# Patient Record
Sex: Male | Born: 1985 | Race: Black or African American | Hispanic: No | Marital: Single | Smoking: Former smoker
Health system: Southern US, Community
[De-identification: ages and names within clinical notes are randomized; demographics above are authoritative.]

## PROBLEM LIST (undated history)

## (undated) ENCOUNTER — Emergency Department (HOSPITAL_COMMUNITY): Admission: EM | Payer: Self-pay | Source: Home / Self Care

## (undated) DIAGNOSIS — F4310 Post-traumatic stress disorder, unspecified: Secondary | ICD-10-CM

## (undated) DIAGNOSIS — Z21 Asymptomatic human immunodeficiency virus [HIV] infection status: Secondary | ICD-10-CM

## (undated) DIAGNOSIS — B1920 Unspecified viral hepatitis C without hepatic coma: Secondary | ICD-10-CM

## (undated) DIAGNOSIS — F319 Bipolar disorder, unspecified: Secondary | ICD-10-CM

## (undated) DIAGNOSIS — I10 Essential (primary) hypertension: Secondary | ICD-10-CM

## (undated) HISTORY — PX: NO PAST SURGERIES: SHX2092

---

## 2014-05-01 ENCOUNTER — Emergency Department (HOSPITAL_COMMUNITY): Payer: Self-pay

## 2014-05-01 ENCOUNTER — Encounter (HOSPITAL_COMMUNITY): Payer: Self-pay | Admitting: Emergency Medicine

## 2014-05-01 ENCOUNTER — Other Ambulatory Visit: Payer: Self-pay

## 2014-05-01 ENCOUNTER — Emergency Department (HOSPITAL_COMMUNITY)
Admission: EM | Admit: 2014-05-01 | Discharge: 2014-05-02 | Disposition: A | Discharge status: Home / Self Care | Payer: Self-pay | Attending: Emergency Medicine | Admitting: Emergency Medicine

## 2014-05-01 DIAGNOSIS — I10 Essential (primary) hypertension: Secondary | ICD-10-CM | POA: Insufficient documentation

## 2014-05-01 DIAGNOSIS — F411 Generalized anxiety disorder: Secondary | ICD-10-CM | POA: Insufficient documentation

## 2014-05-01 DIAGNOSIS — IMO0002 Reserved for concepts with insufficient information to code with codable children: Secondary | ICD-10-CM | POA: Insufficient documentation

## 2014-05-01 DIAGNOSIS — Z008 Encounter for other general examination: Secondary | ICD-10-CM | POA: Insufficient documentation

## 2014-05-01 DIAGNOSIS — G479 Sleep disorder, unspecified: Secondary | ICD-10-CM | POA: Insufficient documentation

## 2014-05-01 DIAGNOSIS — F1210 Cannabis abuse, uncomplicated: Secondary | ICD-10-CM | POA: Insufficient documentation

## 2014-05-01 DIAGNOSIS — F4310 Post-traumatic stress disorder, unspecified: Secondary | ICD-10-CM | POA: Insufficient documentation

## 2014-05-01 DIAGNOSIS — F319 Bipolar disorder, unspecified: Secondary | ICD-10-CM | POA: Insufficient documentation

## 2014-05-01 DIAGNOSIS — R51 Headache: Secondary | ICD-10-CM | POA: Insufficient documentation

## 2014-05-01 HISTORY — DX: Essential (primary) hypertension: I10

## 2014-05-01 HISTORY — DX: Bipolar disorder, unspecified: F31.9

## 2014-05-01 HISTORY — DX: Post-traumatic stress disorder, unspecified: F43.10

## 2014-05-01 LAB — CBC
HCT: 40.1 % (ref 39.0–52.0)
Hemoglobin: 14.2 g/dL (ref 13.0–17.0)
MCH: 28 pg (ref 26.0–34.0)
MCHC: 35.4 g/dL (ref 30.0–36.0)
MCV: 78.9 fL (ref 78.0–100.0)
Platelets: 316 10*3/uL (ref 150–400)
RBC: 5.08 MIL/uL (ref 4.22–5.81)
RDW: 13.4 % (ref 11.5–15.5)
WBC: 12 10*3/uL — ABNORMAL HIGH (ref 4.0–10.5)

## 2014-05-01 LAB — COMPREHENSIVE METABOLIC PANEL
ALT: 12 U/L (ref 0–53)
AST: 19 U/L (ref 0–37)
Albumin: 4.3 g/dL (ref 3.5–5.2)
Alkaline Phosphatase: 55 U/L (ref 39–117)
Anion gap: 15 (ref 5–15)
BUN: 12 mg/dL (ref 6–23)
CO2: 22 mEq/L (ref 19–32)
Calcium: 9.5 mg/dL (ref 8.4–10.5)
Chloride: 103 mEq/L (ref 96–112)
Creatinine, Ser: 1.08 mg/dL (ref 0.50–1.35)
GFR calc Af Amer: >90 mL/min (ref >90)
GFR calc non Af Amer: >90 mL/min (ref >90)
Glucose, Bld: 85 mg/dL (ref 70–99)
Potassium: 4.1 mEq/L (ref 3.7–5.3)
Sodium: 140 mEq/L (ref 137–147)
Total Bilirubin: 0.5 mg/dL (ref 0.3–1.2)
Total Protein: 7.2 g/dL (ref 6.0–8.3)

## 2014-05-01 LAB — I-STAT TROPONIN, ED: Troponin i, poc: 0 ng/mL (ref 0.00–0.08)

## 2014-05-01 LAB — ETHANOL: Alcohol, Ethyl (B): <11 mg/dL (ref 0–11)

## 2014-05-01 LAB — SALICYLATE LEVEL: Salicylate Lvl: <2 mg/dL — ABNORMAL LOW (ref 2.8–20.0)

## 2014-05-01 LAB — ACETAMINOPHEN LEVEL: Acetaminophen (Tylenol), Serum: <15 ug/mL (ref 10–30)

## 2014-05-01 MED ORDER — ALUM & MAG HYDROXIDE-SIMETH 200-200-20 MG/5ML PO SUSP: 30.0000 mL | ORAL | Status: DC | PRN

## 2014-05-01 MED ORDER — LORAZEPAM 1 MG PO TABS
1.0000 mg | ORAL_TABLET | Freq: Three times a day (TID) | ORAL | Status: DC | PRN
  Administered 2014-05-02: 1 mg via ORAL
  Filled 2014-05-01: qty 1

## 2014-05-01 MED ORDER — IBUPROFEN 400 MG PO TABS: 600.0000 mg | ORAL_TABLET | Freq: Three times a day (TID) | ORAL | Status: DC | PRN

## 2014-05-01 MED ORDER — ZOLPIDEM TARTRATE 5 MG PO TABS: 5.0000 mg | ORAL_TABLET | Freq: Every evening | ORAL | Status: DC | PRN

## 2014-05-01 MED ORDER — ONDANSETRON HCL 4 MG PO TABS: 4.0000 mg | ORAL_TABLET | Freq: Three times a day (TID) | ORAL | Status: DC | PRN

## 2014-05-01 MED ORDER — NICOTINE 21 MG/24HR TD PT24
21.0000 mg | MEDICATED_PATCH | Freq: Every day | TRANSDERMAL | Status: DC
Start: 2014-05-02 — End: 2014-05-02

## 2014-05-01 NOTE — ED Notes (Signed)
This RN walked by pt and found him talking to himself, he then began [facility] stating "why the f**k you over here, black man sitting down, I'm just trying to get my medicine." This RN stopped tand talked with pt, I informed pt we have [employer] 24/7 and they are not here for him. Pt then stated "I just want my medicine, I have been off my bipolar medications since October."

## 2014-05-01 NOTE — ED Notes (Signed)
Patient refusing blood work.

## 2014-05-01 NOTE — ED Notes (Signed)
RN could not complete chest pain/cardiac assessment because patient refused and became verbally aggressive. Patient refusing EKG. MD notified.

## 2014-05-01 NOTE — ED Notes (Signed)
Patient aware a urine sample is needed. 

## 2014-05-01 NOTE — ED Notes (Signed)
Per [facility] pt was walking [place] and [place] [address], her reported to EMS he had walking all day. Pt reported he is from [place]. EMS reports pt pointed to his mid-chest when asked what was bothering him today. Pt then refused to talk to the EMS any more. VSS

## 2014-05-01 NOTE — ED Provider Notes (Signed)
CSN: [id number]     Arrival date & time 05/01/14  2050 History   First MD Initiated Contact with Patient 05/01/14 2132     Chief Complaint  Patient presents with  . Chest Pain  . Medical Clearance     (Consider location/radiation/quality/duration/timing/severity/associated sxs/prior Treatment) Patient is a 28 y.o. male presenting with chest pain. The history is provided by the patient and medical records. No language interpreter was used.  Chest Pain Associated symptoms: headache (resolved)   Associated symptoms: no abdominal pain, no back pain, no cough, no diaphoresis, no fatigue, no fever, no nausea, no shortness of breath and not vomiting     Tristan [name] is a 28 y.o. male  with a hx of HTN, PTSD, Bipolar 1 presents to the Emergency Department complaining of gradual, persistent, progressively worsening feelings of being unsafe and unable to control his emotions with an unknown onset.  Patient arrives by EMS after being picked up while [place] [address].  Patient reports he is from [place] to refuse to give EMS any further information and upon arriving here in the emergency room began screaming at Tristan [name].  Patient refused assessment by RN as well as blood work.  On my assessment patient tearful, stopping in the room stating that he feels unsafe and that he is unable to control his emotions. Patient reports he is not taking any medications for his bipolar disorder and states he has not taken them in many years.  He wants to know if this is safe place.    He reports chest pain persistent for approximately one hour earlier today but denies associated symptoms with it.  He also reports that he had a headache at some point which is also resolved.  He denies personal or family cardiac history. He denies smoking, alcohol use or drug abuse.   Past Medical History  Diagnosis Date  . Hypertension   . PTSD (post-traumatic stress disorder)   . Bipolar 1 disorder    History reviewed. No  pertinent past surgical history. No family history on file. History  Substance Use Topics  . Smoking status: Never Smoker   . Smokeless tobacco: Not on file  . Alcohol Use: No    Review of Systems  Constitutional: Negative for fever, diaphoresis, appetite change, fatigue and unexpected weight change.  HENT: Negative for mouth sores.   Eyes: Negative for visual disturbance.  Respiratory: Negative for cough, chest tightness, shortness of breath and wheezing.   Cardiovascular: Positive for chest pain ( resolved).  Gastrointestinal: Negative for nausea, vomiting, abdominal pain, diarrhea and constipation.  Endocrine: Negative for polydipsia, polyphagia and polyuria.  Genitourinary: Negative for dysuria, urgency, frequency and hematuria.  Musculoskeletal: Negative for back pain and neck stiffness.  Skin: Negative for rash.  Allergic/Immunologic: Negative for immunocompromised state.  Neurological: Positive for headaches (resolved). Negative for syncope and light-headedness.  Hematological: Does not bruise/bleed easily.  Psychiatric/Behavioral: Positive for sleep disturbance. Negative for suicidal ideas. The patient is nervous/anxious.       Allergies  Review of patient's allergies indicates no known allergies.  Home Medications   Prior to Admission medications   Not on File   BP 107/71  Pulse 93  Temp(Src) 98.4 F (36.9 C) (Oral)  Resp 16  Ht 5\' 10"  (1.778 m)  Wt 185 lb (83.915 kg)  BMI 26.54 kg/m2  SpO2 99% Physical Exam  Nursing note and vitals reviewed. Constitutional: He is oriented to person, place, and time. He appears well-developed and well-nourished. No distress.  Awake, alert, nontoxic appearance  HENT:  Head: Normocephalic and atraumatic.  Mouth/Throat: Oropharynx is clear and moist. No oropharyngeal exudate.  Eyes: Conjunctivae are normal. No scleral icterus.  Neck: Normal range of motion. Neck supple.  Cardiovascular: Normal rate, regular rhythm, normal  heart sounds and intact distal pulses.   No murmur heard. Pulmonary/Chest: Effort normal and breath sounds normal. No respiratory distress. He has no wheezes.  Equal chest expansion  Abdominal: Soft. Bowel sounds are normal. He exhibits no mass. There is no tenderness. There is no rebound and no guarding.  Musculoskeletal: Normal range of motion. He exhibits no edema.  Neurological: He is alert and oriented to person, place, and time. He exhibits normal muscle tone. Coordination normal.  Speech is clear and goal oriented Moves extremities without ataxia  Skin: Skin is warm and dry. He is not diaphoretic.  Psychiatric: His mood appears anxious. His affect is labile. His speech is rapid and/or pressured. He is agitated. He is not actively hallucinating. He expresses no homicidal and no suicidal ideation. He expresses no suicidal plans and no homicidal plans.    ED Course  Procedures (including critical care time) Labs Review Labs Reviewed  CBC - Abnormal; Notable for the following:    WBC 12.0 (*)    All other components within normal limits  SALICYLATE LEVEL - Abnormal; Notable for the following:    Salicylate Lvl <2.0 (*)    All other components within normal limits  ACETAMINOPHEN LEVEL  ETHANOL  COMPREHENSIVE METABOLIC PANEL  URINE RAPID DRUG SCREEN (HOSP PERFORMED)  URINALYSIS, ROUTINE W REFLEX MICROSCOPIC  I-STAT TROPOININ, ED    Imaging Review Dg Chest 2 View  05/01/2014   CLINICAL DATA:  Chest pain under the left breast.  EXAM: CHEST  2 VIEW  COMPARISON:  None.  FINDINGS: The lungs are well-aerated and clear. There is no evidence of focal opacification, pleural effusion or pneumothorax.  The heart is normal in size; the mediastinal contour is within normal limits. No acute osseous abnormalities are seen.  IMPRESSION: No acute cardiopulmonary process seen.   Electronically Signed   By: Roanna Raider M.D.   On: 05/01/2014 22:45     EKG Interpretation None      MDM    Final diagnoses:  PTSD (post-traumatic stress disorder)  Bipolar 1 disorder   Tristan [name] presents with labile emotions, resolved HA and CP and no cardiac Hx.  Pt reports no meds for Bipolar disorder, but feels unsafe.  He denies SI/HI and cannot tell me why he feels unsafe.  Pt denies auditory and visual hallucinations, but endorses sleep walking.    11:56 PM Labs reassuring and pt is medically cleared for [facility] assessment.    BP 107/71  Pulse 93  Temp(Src) 98.4 F (36.9 C) (Oral)  Resp 16  Ht 5\' 10"  (1.778 m)  Wt 185 lb (83.915 kg)  BMI 26.54 kg/m2  SpO2 99%   Dierdre Forth, PA-C 05/02/14 1610

## 2014-05-01 NOTE — ED Notes (Signed)
Hannah PA-C at bedside.  

## 2014-05-01 NOTE — ED Notes (Signed)
RN had therapeutic discussion with patient and explained treatment plan and explained to patient that this was a safe environment. Patient expressed concern that people are judging him and he is not a real problem and he is just taking up space. RN reassured patient that all of his medical care is confidential and we are here to treat him and advocate for him, not judge him. Patient is now more cooperative, but tearful and states he needs help. RN reassured patient he is in the right place for help.

## 2014-05-02 ENCOUNTER — Encounter (HOSPITAL_COMMUNITY): Payer: Self-pay | Admitting: General Practice

## 2014-05-02 ENCOUNTER — Inpatient Hospital Stay (HOSPITAL_COMMUNITY)
Admission: AD | Admit: 2014-05-02 | Discharge: 2014-05-06 | DRG: 885 | Disposition: A | Discharge status: Home / Self Care | Payer: Federal, State, Local not specified - Other | Source: Intra-hospital | Attending: Psychiatry | Admitting: Psychiatry

## 2014-05-02 ENCOUNTER — Encounter (HOSPITAL_COMMUNITY): Payer: Self-pay | Admitting: Emergency Medicine

## 2014-05-02 DIAGNOSIS — F411 Generalized anxiety disorder: Secondary | ICD-10-CM | POA: Diagnosis present

## 2014-05-02 DIAGNOSIS — G4700 Insomnia, unspecified: Secondary | ICD-10-CM | POA: Diagnosis present

## 2014-05-02 DIAGNOSIS — F4310 Post-traumatic stress disorder, unspecified: Secondary | ICD-10-CM | POA: Diagnosis present

## 2014-05-02 DIAGNOSIS — F3160 Bipolar disorder, current episode mixed, unspecified: Principal | ICD-10-CM

## 2014-05-02 DIAGNOSIS — F1210 Cannabis abuse, uncomplicated: Secondary | ICD-10-CM | POA: Diagnosis present

## 2014-05-02 DIAGNOSIS — I10 Essential (primary) hypertension: Secondary | ICD-10-CM | POA: Diagnosis present

## 2014-05-02 DIAGNOSIS — F602 Antisocial personality disorder: Secondary | ICD-10-CM | POA: Diagnosis present

## 2014-05-02 DIAGNOSIS — F410 Panic disorder [episodic paroxysmal anxiety] without agoraphobia: Secondary | ICD-10-CM | POA: Diagnosis present

## 2014-05-02 LAB — URINALYSIS, ROUTINE W REFLEX MICROSCOPIC
Bilirubin Urine: NEGATIVE
Glucose, UA: NEGATIVE mg/dL
Hgb urine dipstick: NEGATIVE
Ketones, ur: NEGATIVE mg/dL
Leukocytes, UA: NEGATIVE
Nitrite: NEGATIVE
Protein, ur: NEGATIVE mg/dL
Specific Gravity, Urine: 1.023 (ref 1.005–1.030)
Urobilinogen, UA: 1 mg/dL (ref 0.0–1.0)
pH: 6 (ref 5.0–8.0)

## 2014-05-02 LAB — RAPID URINE DRUG SCREEN, HOSP PERFORMED
Amphetamines: NOT DETECTED
Barbiturates: NOT DETECTED
Benzodiazepines: NOT DETECTED
Cocaine: NOT DETECTED
Opiates: NOT DETECTED
Tetrahydrocannabinol: POSITIVE — AB

## 2014-05-02 MED ORDER — OLANZAPINE 10 MG PO TBDP
5.0000 mg | ORAL_TABLET | Freq: Three times a day (TID) | ORAL | Status: DC | PRN
  Administered 2014-05-02: 5 mg via ORAL
  Filled 2014-05-02: qty 1

## 2014-05-02 MED ORDER — SERTRALINE HCL 50 MG PO TABS: 50.0000 mg | ORAL_TABLET | Freq: Every day | ORAL | Status: DC

## 2014-05-02 MED ORDER — LORAZEPAM 2 MG/ML IJ SOLN
INTRAMUSCULAR | Status: AC
  Filled 2014-05-02: qty 1

## 2014-05-02 MED ORDER — TRAZODONE HCL 50 MG PO TABS: 50.0000 mg | ORAL_TABLET | Freq: Every evening | ORAL | Status: DC | PRN

## 2014-05-02 MED ORDER — OLANZAPINE 10 MG IM SOLR: 5.0000 mg | Freq: Three times a day (TID) | INTRAMUSCULAR | Status: DC | PRN

## 2014-05-02 MED ORDER — LORAZEPAM 2 MG/ML IJ SOLN: 2.0000 mg | Freq: Once | INTRAMUSCULAR | Status: DC

## 2014-05-02 MED ORDER — ACETAMINOPHEN 325 MG PO TABS
650.0000 mg | ORAL_TABLET | Freq: Four times a day (QID) | ORAL | Status: DC | PRN
  Administered 2014-05-02: 325 mg via ORAL
  Filled 2014-05-02: qty 2

## 2014-05-02 MED ORDER — MAGNESIUM HYDROXIDE 400 MG/5ML PO SUSP
30.0000 mL | Freq: Every day | ORAL | Status: DC | PRN
  Administered 2014-05-03: 30 mL via ORAL

## 2014-05-02 MED ORDER — HYDROXYZINE HCL 25 MG PO TABS: 25.0000 mg | ORAL_TABLET | Freq: Four times a day (QID) | ORAL | Status: DC | PRN

## 2014-05-02 MED ORDER — ALUM & MAG HYDROXIDE-SIMETH 200-200-20 MG/5ML PO SUSP: 30.0000 mL | ORAL | Status: DC | PRN

## 2014-05-02 MED ORDER — QUETIAPINE FUMARATE ER 50 MG PO TB24
150.0000 mg | ORAL_TABLET | Freq: Every day | ORAL | Status: DC
  Filled 2014-05-02: qty 3

## 2014-05-02 NOTE — ED Notes (Signed)
Pt. Denies needs at this time.

## 2014-05-02 NOTE — ED Notes (Addendum)
TTS completed at this time, pt expressing concerns regarding whether or not he can have access to a phone. Informed pt that [facility] has set times during the day in which patients can use phone. Pt states he needs to let family know where he is. Nurse states he is more than welcome to make those phone calls in the morning. Pt escalated at this time, states "he needs to talk to someone or it's going to get serious." Pt pacing in room at this time. Charge nurse and [employer] notified.

## 2014-05-02 NOTE — Tx Team (Signed)
Initial Interdisciplinary Treatment Plan   PATIENT STRESSORS: Medication change or noncompliance   PROBLEM LIST: Problem List/Patient Goals Date to be addressed Date deferred Reason deferred Estimated date of resolution  Psychosis 05/02/2014                                                      DISCHARGE CRITERIA:  Improved stabilization in mood, thinking, and/or behavior Verbal commitment to aftercare and medication compliance  PRELIMINARY DISCHARGE PLAN: Attend PHP/IOP Outpatient therapy  PATIENT/FAMIILY INVOLVEMENT: This treatment plan has been presented to and reviewed with the patient, Tristan [name].  The patient and family have been given the opportunity to ask questions and make suggestions.  Ned ClinesDopson, Zarion Oliff E 05/02/2014, 3:02 PM

## 2014-05-02 NOTE — ED Notes (Signed)
Yelverton and charge nurse at bedside to de-escalate pt, pt states he is willing to take PO ativan, refusing IM ativan at this time.

## 2014-05-02 NOTE — ED Notes (Addendum)
Pt continuing to make threats, EDPs notified, see new orders. Pt refusing to communicate with [employer] and security. States he feels unsafe in hospital, states he feels [facility], "He's been watching me since I got here."

## 2014-05-02 NOTE — ED Provider Notes (Signed)
Patient is becoming increasingly agitated. Personally examined patient. He is paranoid. Nursing staff states he's uttering threats to them. Admitted to behavioral health worker that he was afraid he was going to "go off on someone". Recommended inpatient treatment. Given concern for patient elopement, poor insight, incapacity to make medical decisions regarding his care, and threat to others, IVC paperwork was completed. Patient been given sedative and awaits inpatient psychiatric bed.  Loren Raceravid Challis Crill, MD 05/02/14 [phone]

## 2014-05-02 NOTE — BH Assessment (Signed)
TTS did not receive call but noticed TTS consult request was ordered. Spoke to Earley FavorGail Schulz, NP who said to proceed with tele-assessment.  Harlin RainFord Ellis Ria CommentWarrick Jr, LPC, [facility] Triage Specialist [phone]

## 2014-05-02 NOTE — ED Notes (Signed)
[employer] HAS ARRIVED TO TRANSPORT PT. BELONGINGS AND VALUABLES SENT WITH PATIENT TO BH
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
## 2014-05-02 NOTE — ED Notes (Signed)
Spoke with pt's father at pt's request, updated him regarding pt condition at this time. Pt's father left phone number and requests that he be updated regarding where pt is placed. Gretel AcrePastor Pedro [phone]

## 2014-05-02 NOTE — ED Notes (Signed)
Spoke with pt's mother Ander SladeJoy in front of pt regarding pt's safety and plans for continued care at this time. Answered questions and informed mother that we would notify her when [facility]. Joy [phone]. Mother also requesting that she be informed when she will be allowed to see pt, will call back in the morning.

## 2014-05-02 NOTE — Progress Notes (Signed)
Patient ID: Tristan [name], male   DOB: 1986/08/19, 28 y.o.   MRN: [id number] D: Client in bed most of the evening except up to BR, asks about snacks, reports he did eat upon admission, minimal conversation. A: Writer introduced self to client assessed for s/s of distress and current needs. Staff will monitor q7015min for safety, encouraged group. R: Client is safe on the unit, did not attend group, or get up for snacks.

## 2014-05-02 NOTE — ED Notes (Signed)
[employer] CALLED TO TRANSPORT 

## 2014-05-02 NOTE — ED Notes (Signed)
Tele-psych assessment. 

## 2014-05-02 NOTE — BH Assessment (Signed)
Assessment complete. Tristan [name][facility], confirmed adult unit is at [facility]. Gave clinical report to Janann Augustori Burkett, NP who said Pt meets criteria for inpatient psychiatric treatment. TTS will contact other facilities for placement. Notified Dr. Loren Raceravid Yelverton of recommendation. Dr. Ranae PalmsYelverton asked if Pt should be placed under IVC. Due to Pt being off psychiatric medication, his thoughts of harming people, his reports of paranoia and his history of violence, this LPC recommends Pt be placed under IVC if he threatens to leave the ED.  Harlin RainFord Ellis Ria CommentWarrick Jr, LPC, [facility] Triage Specialist [phone]

## 2014-05-02 NOTE — Progress Notes (Signed)
Patient ID: Tristan [name], male   DOB: 1986-03-29, 28 y.o.   MRN: [id number]  Tristan [name] is a 28 year old African American male [facility] for increasing paranoid behavior and Bipolar disorder. Patient has a past medical history of hypertension, bipolar 1 disorder, and PTSD. Patient was admitted to the adult unit and verbalized understanding of the admission process. Patient was slightly irritable during admission but cooperative. Integumentary assessment was unremarkable, nothing noted other than 4 tattoos. When patient was brought back onto the unit his agitation increased and was seen standing with guarded disposition. Patient was agitated and remained agitated until placed in a room by himself. Patient was given PRN Zyprexa and PRN Tylenol. Patient reported feeling better after this. Patient is oriented to the unit. Q15 minute safety checks were initiated and patient is safe at this time.

## 2014-05-03 ENCOUNTER — Encounter (HOSPITAL_COMMUNITY): Payer: Self-pay | Admitting: Psychiatry

## 2014-05-03 MED ORDER — RISPERIDONE 1 MG PO TABS
1.0000 mg | ORAL_TABLET | Freq: Two times a day (BID) | ORAL | Status: DC
  Administered 2014-05-03 – 2014-05-04 (×2): 1 mg via ORAL
  Filled 2014-05-03 (×6): qty 1

## 2014-05-03 MED ORDER — DIVALPROEX SODIUM 250 MG PO DR TAB
250.0000 mg | DELAYED_RELEASE_TABLET | Freq: Two times a day (BID) | ORAL | Status: DC
  Administered 2014-05-03 – 2014-05-06 (×7): 250 mg via ORAL
  Filled 2014-05-03 (×2): qty 1
  Filled 2014-05-03: qty 28
  Filled 2014-05-03 (×5): qty 1
  Filled 2014-05-03: qty 28
  Filled 2014-05-03 (×2): qty 1

## 2014-05-03 MED ORDER — TRAZODONE HCL 50 MG PO TABS
50.0000 mg | ORAL_TABLET | Freq: Every day | ORAL | Status: DC
  Administered 2014-05-03 – 2014-05-05 (×3): 50 mg via ORAL
  Filled 2014-05-03: qty 14
  Filled 2014-05-03 (×4): qty 1

## 2014-05-03 NOTE — Progress Notes (Signed)
Patient ID: Tristan [name], male   DOB: 05-29-1986, 28 y.o.   MRN: [id number] D: Patient lying in bed.  Patient is not forthcoming with staff.  He is focused on discharge.  Patient states he is tired and has low energy.  He is not attending groups or participating in his treatment.  He denies any SI/HI/AVH. A: Continue to monitor medication and MD orders. R: Patient has minimal contact with staff.
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
## 2014-05-03 NOTE — Progress Notes (Signed)
Pt resting in bed with eyes closed. No distress noted. Will continue to monitor closely.  
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
## 2014-05-03 NOTE — BHH Suicide Risk Assessment (Signed)
   Nursing information obtained from:  Patient Demographic factors:  Male Current Mental Status:  NA Loss Factors:  Financial problems / change in socioeconomic status Historical Factors:  Impulsivity Risk Reduction Factors:  Religious beliefs about death Total Time spent with patient: 1 hour  CLINICAL FACTORS:   Severe Anxiety and/or Agitation Bipolar Disorder:   Mixed State  Psychiatric Specialty Exam: Physical Exam  Constitutional: He is oriented to person, place, and time. He appears well-developed and well-nourished.  HENT:  Head: Normocephalic and atraumatic.  Eyes: Conjunctivae are normal. Pupils are equal, round, and reactive to light.  Neck: Normal range of motion. Neck supple.  Cardiovascular: Normal rate and regular rhythm.  Exam reveals no gallop and no friction rub.   No murmur heard. Respiratory: Effort normal and breath sounds normal. No respiratory distress. He has no wheezes. He has no rales. He exhibits no tenderness.  GI: Soft. He exhibits no distension. There is no tenderness. There is no rebound and no guarding.  Neurological: He is alert and oriented to person, place, and time.  Skin: Skin is warm.  Psychiatric: His speech is normal. His mood appears anxious. His affect is angry and labile. He is agitated. Thought content is paranoid. Cognition and memory are normal. He expresses impulsivity. He exhibits a depressed mood. He expresses homicidal ideation.    ROS  Blood pressure 122/72, pulse 60, temperature 98.5 F (36.9 C), temperature source Oral, resp. rate 20, height 5' 9.75" (1.772 m), weight 79.379 kg (175 lb).Body mass index is 25.28 kg/(m^2).  General Appearance: Casual  Eye Contact::  Fair  Speech:  Normal Rate  Volume:  Normal  Mood:  Angry, Anxious and Irritable  Affect:  Labile and Tearful  Thought Process:  Linear  Orientation:  Full (Time, Place, and Person)  Thought Content:  Hallucinations: Auditory Hears his own voice and Paranoid Ideation   Suicidal Thoughts:  No  Homicidal Thoughts:  Yes.  without intent/plan  Memory:  Immediate;   Good  Judgement:  Impaired  Insight:  Lacking  Psychomotor Activity:  Increased  Concentration:  Fair  Recall:  Good  Fund of Knowledge:Fair  Language: Good  Akathisia:  No  Handed:  Right  AIMS (if indicated):     Assets:  Communication Skills  Sleep:  Number of Hours: 6.75   Musculoskeletal: Strength & Muscle Tone: within normal limits Gait & Station: normal Patient leans: N/A  COGNITIVE FEATURES THAT CONTRIBUTE TO RISK:  Closed-mindedness    SUICIDE RISK:   Minimal: No identifiable suicidal ideation.  Patients presenting with no risk factors but with morbid ruminations; may be classified as minimal risk based on the severity of the depressive symptoms  PLAN OF CARE:  I certify that inpatient services furnished can reasonably be expected to improve the patient's condition.  Shakesha Soltau 05/03/2014, 11:37 AM

## 2014-05-03 NOTE — ED Provider Notes (Signed)
Medical screening examination/treatment/procedure(s) were performed by non-physician practitioner and as supervising physician I was immediately available for consultation/collaboration.   EKG Interpretation   Date/Time:  Sunday May 01 2014 22:04:33 EDT Ventricular Rate:  82 PR Interval:  156 QRS Duration: 93 QT Interval:  371 QTC Calculation: 433 R Axis:   82 Text Interpretation:  Sinus rhythm ST elev, probable normal early repol  pattern ED PHYSICIAN INTERPRETATION AVAILABLE IN [facility] Confirmed  by TEST, Record ([id number]) on 05/03/2014 7:42:12 AM        Toy CookeyMegan Docherty, MD 05/03/14 1238

## 2014-05-04 MED ORDER — RISPERIDONE 2 MG PO TABS
2.0000 mg | ORAL_TABLET | Freq: Two times a day (BID) | ORAL | Status: DC
  Administered 2014-05-04 – 2014-05-06 (×4): 2 mg via ORAL
  Filled 2014-05-04 (×4): qty 1
  Filled 2014-05-04 (×2): qty 14
  Filled 2014-05-04 (×3): qty 1

## 2014-05-04 MED ORDER — BENZTROPINE MESYLATE 0.5 MG PO TABS
0.5000 mg | ORAL_TABLET | Freq: Two times a day (BID) | ORAL | Status: DC
  Administered 2014-05-04 – 2014-05-06 (×5): 0.5 mg via ORAL
  Filled 2014-05-04: qty 1
  Filled 2014-05-04: qty 28
  Filled 2014-05-04 (×2): qty 1
  Filled 2014-05-04: qty 28
  Filled 2014-05-04 (×5): qty 1

## 2014-05-04 NOTE — Progress Notes (Signed)
Patient ID: Tristan [name], male   DOB: 18-Feb-1986, 28 y.o.   MRN: [id number] D: Client in bed most of the evening, isolates, reports "doing okay, just tired, from exercise today" denies SHI, AVH. A: Writer encouraged client to report any concerns, also encouraged group . Staff will monitor q6215min for safety. R: Client is safe on the unit, did not attend group.

## 2014-05-04 NOTE — Progress Notes (Signed)
[facility] MD Progress Note  05/04/2014 2:26 PM Tristan [name]  MRN:  [id number] Subjective:  " I woke up with voices in my head". Patient reports that he is better today other than having the voices in his head. He reports that his mood is better though.He had a talk with his parents and he feels better since they encouraged him to do well.  Objective : Patient seen and chart reviewed. Patient appeared to be pleasant. Reports AH  Which he describes as his own voice.He denies VH/SI/HI. Reports sleep as average and that his appetite as fair.Denies any side effects of medications. Denies tremors or rigidity.  Per staff no new complaints or concerns.    Diagnosis:   DSM5:  Primary psychiatric diagnosis  Bipolar 1 disorder, Mixed ,multiple episodes currently in acute episode   Secondary psychiatric diagnosis  Antisocial personality disorder  Cannabis use disorder  Opioid use disorder   Non Psychiatric diagnosis  Hx of HTN       Total Time spent with patient: 20 minutes    ADL's:  Intact  Sleep: Fair  Appetite:  Fair  Suicidal Ideation: denies    Homicidal Ideation: denies- since he has not seen any police officers.   Psychiatric Specialty Exam: Physical Exam  ROS  Blood pressure 129/56, pulse 68, temperature 98.4 F (36.9 C), temperature source Oral, resp. rate 20, height 5' 9.75" (1.772 m), weight 79.379 kg (175 lb).Body mass index is 25.28 kg/(m^2).  General Appearance: Casual  Eye Contact::  Fair  Speech:  Normal Rate  Volume:  Normal  Mood:  Anxious  Affect:  Congruent  Thought Process:  Logical  Orientation:  Full (Time, Place, and Person)  Thought Content:  Hallucinations: Auditory voices that convers with him all the time  Suicidal Thoughts:  No  Homicidal Thoughts:  No  Memory:  Immediate;   Fair Recent;   Fair Remote;   Fair  Judgement:  Fair  Insight:  Lacking  Psychomotor Activity:  Increased  Concentration:  Poor  Recall:  [employer] of  Knowledge:Fair  Language: Fair  Akathisia:  No  Handed:  Right  AIMS (if indicated):     Assets:  Communication Skills  Sleep:  Number of Hours: 4.5   Musculoskeletal: Strength & Muscle Tone: within normal limits Gait & Station: normal Patient leans: N/A  Current Medications: Current Facility-Administered Medications  Medication Dose Route Frequency Provider Last Rate Last Dose  . acetaminophen (TYLENOL) tablet 650 mg  650 mg Oral Q6H PRN Beau Fanny, FNP   325 mg at 05/02/14 1430  . alum & mag hydroxide-simeth (MAALOX/MYLANTA) 200-200-20 MG/5ML suspension 30 mL  30 mL Oral Q4H PRN Beau Fanny, FNP      . benztropine (COGENTIN) tablet 0.5 mg  0.5 mg Oral BID Jomarie Longs, MD   0.5 mg at 05/04/14 0926  . divalproex (DEPAKOTE) DR tablet 250 mg  250 mg Oral Q12H Jomarie Longs, MD   250 mg at 05/04/14 0854  . hydrOXYzine (ATARAX/VISTARIL) tablet 25 mg  25 mg Oral Q6H PRN Beau Fanny, FNP      . magnesium hydroxide (MILK OF MAGNESIA) suspension 30 mL  30 mL Oral Daily PRN Beau Fanny, FNP   30 mL at 05/03/14 1943  . OLANZapine zydis (ZYPREXA) disintegrating tablet 5 mg  5 mg Oral Q8H PRN Beau Fanny, FNP   5 mg at 05/02/14 1430   Or  . OLANZapine (ZYPREXA) injection 5 mg  5 mg Intramuscular  Q8H PRN Beau FannyJohn C Withrow, FNP      . risperiDONE (RISPERDAL) tablet 1 mg  1 mg Oral BID Jomarie LongsSaramma Adalynd Donahoe, MD   1 mg at 05/04/14 0854  . traZODone (DESYREL) tablet 50 mg  50 mg Oral QHS Jomarie LongsSaramma Etta Gassett, MD   50 mg at 05/03/14 2111    Lab Results: No results found for this or any previous visit (from the past 48 hour(s)).  Physical Findings: AIMS: Facial and Oral Movements Muscles of Facial Expression: None, normal Lips and Perioral Area: None, normal Jaw: None, normal Tongue: None, normal,Extremity Movements Upper (arms, wrists, hands, fingers): None, normal Lower (legs, knees, ankles, toes): None, normal, Trunk Movements Neck, shoulders, hips: None, normal, Overall Severity Severity  of abnormal movements (highest score from questions above): None, normal Incapacitation due to abnormal movements: None, normal Patient's awareness of abnormal movements (rate only patient's report): No Awareness, Dental Status Current problems with teeth and/or dentures?: No Does patient usually wear dentures?: No  CIWA:    COWS:     Treatment Plan Summary: Daily contact with patient to assess and evaluate symptoms and progress in treatment Medication management  Plan: Will increase Risperdal to 2 mg bid today. Will monitor for side effects.  Will add cogentin for side effects of risperdal. -Will continue Depakote 250 mg po bid for mood lability. Will get depakote level on Friday AM.(order in )  -Will encourage patient to participate in therapeutic milieu.          Medical Decision Making Problem Points:  Established problem, stable/improving (1), New problem, with additional work-up planned (4) and Review of psycho-social stressors (1) Data Points:  Review or order clinical lab tests (1) Review of medication regiment & side effects (2)  I certify that inpatient services furnished can reasonably be expected to improve the patient's condition.   Tristan [name] 05/04/2014, 2:26 PM

## 2014-05-04 NOTE — Progress Notes (Signed)
NUTRITION ASSESSMENT  Pt identified as at risk on the Malnutrition Screen Tool  INTERVENTION: 1. Educated patient on the importance of nutrition and encouraged intake of food and beverages. 2. Discussed weight goals.   NUTRITION DIAGNOSIS: Unintentional weight loss related to sub-optimal intake as evidenced by pt report.   Goal: Pt to meet >/= 90% of their estimated nutrition needs.  Monitor:  PO intake  Assessment:  Patient admitted with paranoia, anxiety, and agitation. He had a reduced appetite prior to admission. This has improved slightly, but he still has low energy. Unable to obtain much history from patient. He may have lost some weight, but unable to specify how much.   28 y.o. male  Height: Ht Readings from Last 1 Encounters:  05/02/14 5' 9.75" (1.772 m)    Weight: Wt Readings from Last 1 Encounters:  05/02/14 175 lb (79.379 kg)    Weight Hx: Wt Readings from Last 10 Encounters:  05/02/14 175 lb (79.379 kg)  05/01/14 185 lb (83.915 kg)    BMI:  Body mass index is 25.28 kg/(m^2). Pt meets criteria for overweight based on current BMI.  Estimated Nutritional Needs: Kcal: 25-30 kcal/kg Protein: > 1 gram protein/kg Fluid: 1 ml/kcal  Diet Order: General Pt is also offered choice of unit snacks mid-morning and mid-afternoon.  Pt is eating as desired.   Lab results and medications reviewed.   Linnell FullingElyse Whittney Steenson, RD, LDN Pager #: [phone] After-Hours Pager #: [phone]

## 2014-05-04 NOTE — BHH Suicide Risk Assessment (Signed)
BHH INPATIENT:  Family/Significant Other Suicide Prevention Education  Suicide Prevention Education:  Education Completed; Myna Hidalgohaddeus Keidel (pt's father) [phone] has been identified by the patient as the family member/significant other with whom the patient will be residing, and identified as the person(s) who will aid the patient in the event of a mental health crisis (suicidal ideations/suicide attempt).  With written consent from the patient, the family member/significant other has been provided the following suicide prevention education, prior to the and/or following the discharge of the patient.  The suicide prevention education provided includes the following:  Suicide risk factors  Suicide prevention and interventions  National Suicide Hotline telephone number  [facility] assessment telephone number  [facility] Emergency Assistance 911  [facility] and/or Residential Mobile Crisis Unit telephone number  Request made of family/significant other to:  Remove weapons (e.g., guns, rifles, knives), all items previously/currently identified as safety concern.    Remove drugs/medications (over-the-counter, prescriptions, illicit drugs), all items previously/currently identified as a safety concern.  The family member/significant other verbalizes understanding of the suicide prevention education information provided.  The family member/significant other agrees to remove the items of safety concern listed above.  Smart, Ibtisam Benge LCSWA  05/04/2014, 12:18 PM

## 2014-05-04 NOTE — Progress Notes (Signed)
Patient ID: Tristan [name], male   DOB: March 12, 1986, 28 y.o.   MRN: [id number]  D: Pt. Denies SI/HI and A/V Hallucinations. Patient does not report any pain or discomfort at this time. Patient reports that he slept fsir last night and his concentration and appetite are good. Patient rates his energy normal today.  A: Support and encouragement provided to the patient. Patient given patient education sheets on patient's medications per patient request.  R: Patient is receptive and cooperative but minimal. Patient is seen in the milieu and is pleasant with staff. Patient appears to be involved in his treatment and interested in his care and medications. Q15 minute checks are maintained for safety.

## 2014-05-04 NOTE — [facility] Group Notes (Signed)
[facility]  05/04/2014 , 1:42 PM    Type of Therapy:  M[facility] Presentation  Participation Level:  Active  Participation Quality:  Attentive  Affect:  Blunted  Cognitive:  Oriented  Insight:  Limited  Engagement in Therapy:  Engaged  Modes of Intervention:  Discussion, Education and Socialization  Summary of Progress/Problems:  Tristan [name] from [facility] came to present his recovery story and play the guitar.  Tristan [name] reluctantly came to group.  He was complaining of stomach issues following lunch.  He stayed for 10 minutes and left.  Did not return  Tristan [name], Tristan [name] 05/04/2014 , 1:42 PM

## 2014-05-04 NOTE — [facility] Group Notes (Signed)
[facility]   05/04/2014 10:47 AM  Participation Quality:  Engaged  Mood/Affect:  Flat  Depression Rating:  4  Anxiety Rating:  7  Thoughts of Suicide:  No Will you contract for safety?   NA  Current AVH:  Denies  Plan for Discharge/Comments:  Pt was patient listening to rants of another pt.  Stated he thinks he got about 5-6 hours of sleep last night, and is not experiencing any side effects from medication. Says that he is willing to continue to take meds.  He wants to know when he will be discharged.  States he already saw the Dr today and she avoided giving him an answer.    Transportation Means: family/bus  Supports: family  Tristan [name], Tristan [name]

## 2014-05-05 DIAGNOSIS — F1210 Cannabis abuse, uncomplicated: Secondary | ICD-10-CM

## 2014-05-05 DIAGNOSIS — F319 Bipolar disorder, unspecified: Secondary | ICD-10-CM

## 2014-05-05 DIAGNOSIS — F602 Antisocial personality disorder: Secondary | ICD-10-CM

## 2014-05-05 DIAGNOSIS — F1110 Opioid abuse, uncomplicated: Secondary | ICD-10-CM

## 2014-05-05 DIAGNOSIS — F3160 Bipolar disorder, current episode mixed, unspecified: Principal | ICD-10-CM

## 2014-05-05 NOTE — Clinical Social Work Note (Signed)
CSW provided pt with vocational rehab information, [facility] info (for [facility]), and [facility] for group therapy/peer support. Pt first goal is to get [place] ID in order to receive voc rehab services. Pt plans to live with his father upon d/c until his mother's physical health improves. Pt hoping to d/c tomorrow.   The Sherwin-WilliamsHeather Smart, LCSWA 05/05/2014 3:18 PM

## 2014-05-05 NOTE — [facility] Group Notes (Signed)
[facility] Group Notes:  (Counselor/Nursing/MHT/Case Management/Adjunct)  05/05/2014 1:15PM  Type of Therapy:  Group Therapy  Participation Level:  Active  Participation Quality:  Appropriate  Affect:  Flat  Cognitive:  Oriented  Insight:  Improving  Engagement in Group:  Limited  Engagement in Therapy:  Limited  Modes of Intervention:  Discussion, Exploration and Socialization  Summary of Progress/Problems: The topic for group was balance in life.  Pt participated in the discussion about when their life was in balance and out of balance and how this feels.  Pt discussed ways to get back in balance and short term goals they can work on to get where they want to be.  Tristan [name] reluctantly came to the group, but was fully engaged once there.  In fact, he was the most active member and a co-therapist in a way that I have not had for a long time.  He was positive, encouraging of others while also prodding them to do better, honest about his own poor decisions, and willing to take responsibility for self going forward.  A delight to work with today.   [name], [name] 05/05/2014 2:45 PM

## 2014-05-05 NOTE — Progress Notes (Signed)
Pt alert and oriented x4. Pt calm and cooperative. Pt has been quiet majority of the day. Pt attending morning group with RN, however, Pt often walked out of the room and came back. Pt goals are to "comply." Pt wants to go home. Pt may discharge tomorrow. Pt has tolerated medications. Pt ate lunch and tolerated that as well. Pt denies any SI/HI/AH/VH. Pt contracts for safety. No current concerns. RN will continue to monitor.

## 2014-05-05 NOTE — Progress Notes (Signed)
D: Took over patient's care @ 2330. Patient in bed sleeping. Respiration regular and unlabored. No sign of distress noted at this time A: 15 mins checks for safety. R: Patient is safe.   

## 2014-05-05 NOTE — Progress Notes (Signed)
Patient ID: Tristan [name], male   DOB: 1986/02/07, 28 y.o.   MRN: [id number]  [facility] MD Progress Note  05/05/2014 10:28 AM Tristan [name]  MRN:  [id number]  Subjective:  Patient states: " I think the medication is helping me, my mood is better and the voices in my head is less.''   Objective : Patient seen and chart reviewed. Patient seems to be having some insight into his problem, now acknowledge his mental illness and the need to take medications for same. Today, he is reporting decreased auditory hallucinations, agitation, racing thoughts, worries, anxiety and delusional thinking. Patient denies suicidal/homicidal ideations, intent or plan. He is compliant with unit milieu and his medications. So far, he has denies any adverse reactions to his medications. Patient is requesting to be discharged home to his mother soon.  Diagnosis:   DSM5:  Primary psychiatric diagnosis  Bipolar 1 disorder, Mixed ,multiple episodes currently in acute episode   Secondary psychiatric diagnosis  Antisocial personality disorder  Cannabis use disorder  Opioid use disorder   Non Psychiatric diagnosis  Hx of HTN    Total Time spent with patient: 20 minutes    ADL's:  Intact  Sleep: Fair  Appetite:  Fair  Suicidal Ideation: denies    Homicidal Ideation: denies- since he has not seen any police officers.   Psychiatric Specialty Exam: Physical Exam  Psychiatric: His speech is normal. Judgment normal. His mood appears anxious. He is actively hallucinating. Thought content is paranoid. Cognition and memory are normal.    Review of Systems  Constitutional: Negative.   HENT: Negative.   Eyes: Negative.   Respiratory: Negative.   Cardiovascular: Negative.   Gastrointestinal: Negative.   Genitourinary: Negative.   Musculoskeletal: Negative.   Skin: Negative.   Neurological: Negative.   Endo/Heme/Allergies: Negative.   Psychiatric/Behavioral: Positive for hallucinations. The patient is  nervous/anxious.     Blood pressure 121/63, pulse 72, temperature 98.5 F (36.9 C), temperature source Oral, resp. rate 16, height 5' 9.75" (1.772 m), weight 79.379 kg (175 lb).Body mass index is 25.28 kg/(m^2).  General Appearance: Casual  Eye Contact::  Fair  Speech:  Normal Rate  Volume:  Normal  Mood:  Anxious  Affect:  Congruent  Thought Process:  Logical  Orientation:  Full (Time, Place, and Person)  Thought Content:  Hallucinations: Auditory voices that convers with him all the time  Suicidal Thoughts:  No  Homicidal Thoughts:  No  Memory:  Immediate;   Fair Recent;   Fair Remote;   Fair  Judgement:  Fair  Insight:  Lacking  Psychomotor Activity:  Increased  Concentration:  Poor  Recall:  [employer] of Knowledge:Fair  Language: Fair  Akathisia:  No  Handed:  Right  AIMS (if indicated):     Assets:  Communication Skills  Sleep:  Number of Hours: 4.5   Musculoskeletal: Strength & Muscle Tone: within normal limits Gait & Station: normal Patient leans: N/A  Current Medications: Current Facility-Administered Medications  Medication Dose Route Frequency Provider Last Rate Last Dose  . acetaminophen (TYLENOL) tablet 650 mg  650 mg Oral Q6H PRN Beau Fanny, FNP   325 mg at 05/02/14 1430  . alum & mag hydroxide-simeth (MAALOX/MYLANTA) 200-200-20 MG/5ML suspension 30 mL  30 mL Oral Q4H PRN Beau Fanny, FNP      . benztropine (COGENTIN) tablet 0.5 mg  0.5 mg Oral BID Jomarie Longs, MD   0.5 mg at 05/05/14 0823  . divalproex (DEPAKOTE) DR tablet 250 mg  250 mg Oral Q12H Jomarie LongsSaramma Eappen, MD   250 mg at 05/05/14 [id number]  . hydrOXYzine (ATARAX/VISTARIL) tablet 25 mg  25 mg Oral Q6H PRN Beau FannyJohn C Withrow, FNP      . magnesium hydroxide (MILK OF MAGNESIA) suspension 30 mL  30 mL Oral Daily PRN Beau FannyJohn C Withrow, FNP   30 mL at 05/03/14 1943  . OLANZapine zydis (ZYPREXA) disintegrating tablet 5 mg  5 mg Oral Q8H PRN Beau FannyJohn C Withrow, FNP   5 mg at 05/02/14 1430   Or  . OLANZapine  (ZYPREXA) injection 5 mg  5 mg Intramuscular Q8H PRN Beau FannyJohn C Withrow, FNP      . risperiDONE (RISPERDAL) tablet 2 mg  2 mg Oral BID Jomarie LongsSaramma Eappen, MD   2 mg at 05/05/14 0823  . traZODone (DESYREL) tablet 50 mg  50 mg Oral QHS Jomarie LongsSaramma Eappen, MD   50 mg at 05/04/14 2142    Lab Results: No results found for this or any previous visit (from the past 48 hour(s)).  Physical Findings: AIMS: Facial and Oral Movements Muscles of Facial Expression: None, normal Lips and Perioral Area: None, normal Jaw: None, normal Tongue: None, normal,Extremity Movements Upper (arms, wrists, hands, fingers): None, normal Lower (legs, knees, ankles, toes): None, normal, Trunk Movements Neck, shoulders, hips: None, normal, Overall Severity Severity of abnormal movements (highest score from questions above): None, normal Incapacitation due to abnormal movements: None, normal Patient's awareness of abnormal movements (rate only patient's report): No Awareness, Dental Status Current problems with teeth and/or dentures?: No Does patient usually wear dentures?: No  CIWA:    COWS:     Treatment Plan Summary: Daily contact with patient to assess and evaluate symptoms and progress in treatment Medication management  Plan: Will continue Risperdal  2 mg bid today. Will monitor for side effects.  Will continue Cogentin 0.5mg  bid for side effects of risperdal. -Will continue Depakote 250 mg po bid for mood lability.  -Valproic acid level on 05/06/14. -Will encourage patient to participate in therapeutic milieu.     Medical Decision Making Problem Points:  Established problem, stable/improving (1), New problem, with additional work-up planned (4) and Review of psycho-social stressors (1) Data Points:  Review or order clinical lab tests (1) Review of medication regiment & side effects (2)  I certify that inpatient services furnished can reasonably be expected to improve the patient's condition.   Thedore MinsAkintayo,  Rivers Gassmann, MD 05/05/2014, 10:28 AM

## 2014-05-05 NOTE — Progress Notes (Signed)
D: Patient denies SI/HI or SI.  Pt. Is flat but appropriate.  He states that he is ready to get home to his daughters.  Pt. Is up and interacting with others.  Pt. Has no physical complaints.  A: Patient given emotional support from RN. Patient encouraged to come to staff with concerns and/or questions. Patient's medication routine continued. Patient's orders and plan of care reviewed.   R: Patient remains appropriate and cooperative. Will continue to monitor patient q15 minutes for safety.

## 2014-05-06 LAB — VALPROIC ACID LEVEL: Valproic Acid Lvl: 42.9 ug/mL — ABNORMAL LOW (ref 50.0–100.0)

## 2014-05-06 MED ORDER — RISPERIDONE 2 MG PO TABS: 2.0000 mg | ORAL_TABLET | Freq: Two times a day (BID) | ORAL | Status: DC

## 2014-05-06 MED ORDER — HYDROXYZINE HCL 25 MG PO TABS
25.0000 mg | ORAL_TABLET | Freq: Three times a day (TID) | ORAL | Status: DC | PRN
  Filled 2014-05-06: qty 30

## 2014-05-06 MED ORDER — BENZTROPINE MESYLATE 0.5 MG PO TABS
0.5000 mg | ORAL_TABLET | Freq: Two times a day (BID) | ORAL | Status: DC
Start: 2014-05-06 — End: 2019-02-01

## 2014-05-06 MED ORDER — DIVALPROEX SODIUM 250 MG PO DR TAB: 250.0000 mg | DELAYED_RELEASE_TABLET | Freq: Two times a day (BID) | ORAL | Status: DC

## 2014-05-06 MED ORDER — HYDROXYZINE HCL 25 MG PO TABS
ORAL_TABLET | ORAL | Status: DC
Start: 2014-05-06 — End: 2019-02-01

## 2014-05-06 MED ORDER — TRAZODONE HCL 50 MG PO TABS: 50.0000 mg | ORAL_TABLET | Freq: Every day | ORAL | Status: DC

## 2014-05-06 NOTE — Tx Team (Signed)
Interdisciplinary Treatment Plan Update (Adult)  Date: 05/06/2014   Time Reviewed: 8:44 AM  Progress in Treatment:  Attending groups: Intermittently/pt typically leaves group early if he does attend.  Participating in groups:  Minimally, when he attends.  Taking medication as prescribed: Yes  Tolerating medication: Yes  Family/Significant othe contact made: SPE completed with pt's father.  Patient understands diagnosis: Yes, AEB seeking treatment for mood stabilization, med management, paranoid ideation, and AH.  Discussing patient identified problems/goals with staff: Yes  Medical problems stabilized or resolved: Yes  Denies suicidal/homicidal ideation: Yes during self report.  Patient has not harmed self or Others: Yes  New problem(s) identified:  Discharge Plan or Barriers: Pt [facility]. CS[facility] information for ID recovery, [facility], and vocational rehab. Pt plans to live with his father upon d/c, who is also his pastor and a positive support. Pt does not have a permanent address and may eventually move back in with his mother after she recovers from recent fall.  Additional comments: n/a  Reason for Continuation of Hospitalization: none Estimated length of stay: d/c today For review of initial/current patient goals, please see plan of care.  Attendees:  Patient:    Family:    Physician: Dr. Elna BreslowEappen MD  05/06/2014 8:44 AM   Nursing: Alvis Lemmingsawn RN  8/14/20158:46 AM   Clinical Social Worker [place] Smart, LCSWA  05/06/2014 8:44 AM   Other: Daryel Geraldodney North LCSW  05/06/2014 8:44 AM   Other:    Other:    Other:    Scribe for Treatment Team:  Trula SladeHeather Smart LCSWA 05/06/2014 8:44 AM

## 2014-05-06 NOTE — Progress Notes (Signed)
Patient ID: Tristan [name], male   DOB: [birth year], 28 y.o.   MRN: [id number] DISCHARGE NOTE: Patient discharged at 1400. Given [facility] bus pass. Patient stated that he would be going to his father's home. Patient denied SI/HI. Patient denies auditory or visual hallucinations. Reviewed discharge instructions with patient, including follow-up care, suicide information, and medications. Patient declined [facility], stating, "I can get my medications. I don't want to wait for the sample medications." Patient verbalized understanding of both follow-up care and medication regime.

## 2014-05-06 NOTE — Progress Notes (Signed)
Pt stated that he had a good day but he is ready to go home.

## 2014-05-06 NOTE — Progress Notes (Signed)
[facility] Discharge Plan :  Will you be returning to the same living situation after discharge: Yes,  home with parent At discharge, do you have transportation home?: bus pass in chart  Do you have the ability to pay for your medications:Yes,  mental health  Release of information consent forms completed and submitted to medical records by CSW. Patient to Follow up at: Follow-up Information   Follow up with [facility]. (Walk in between 8am-9am Monday through Friday for hospital follow-up/medication management/assessment for therapy services. )    Contact information:   [address] [place], [place] [address] Phone: [phone] Fax: [phone]      Patient denies SI/HI:   Yes,  during self report.    Safety Planning and Suicide Prevention discussed:  Yes,  SPE completed with pt's father. Pt also provided with SPI pamphlet and encouraged to share information with support network, ask questions, and talk about any concerns relating to SPE.  Smart, Treysean Petruzzi LCSWA  05/06/2014, 8:52 AM

## 2014-05-06 NOTE — Discharge Summary (Signed)
Physician Discharge Summary Note  Patient:  Tristan [name] is an 28 y.o., male MRN:  [id number] DOB:  08-03-1986 Patient phone:  [phone] (home)  Patient address:   [address] [place] [address],  Total Time spent with patient: Greater than 30 minutes  Date of Admission:  05/02/2014  Date of Discharge: 08/14.15  Reason for Admission: Mood stabilization  Discharge Diagnoses: Principal Problem:   Bipolar 1 disorder, mixed  Psychiatric Specialty Exam: Physical Exam  Psychiatric: His speech is normal and behavior is normal. Judgment and thought content normal. His mood appears not anxious. His affect is not angry. Cognition and memory are normal. He does not exhibit a depressed mood.    Review of Systems  Constitutional: Negative.   HENT: Negative.   Eyes: Negative.   Respiratory: Negative.   Cardiovascular: Negative.   Gastrointestinal: Negative.   Genitourinary: Negative.   Musculoskeletal: Negative.   Skin: Negative.   Neurological: Negative.   Endo/Heme/Allergies: Negative.   Psychiatric/Behavioral: Positive for depression (Stable). Negative for suicidal ideas, hallucinations, memory loss and substance abuse. The patient has insomnia [facility]). The patient is not nervous/anxious.     Blood pressure 123/80, pulse 79, temperature 98 F (36.7 C), temperature source Oral, resp. rate 16, height 5' 9.75" (1.772 m), weight 79.379 kg (175 lb).Body mass index is 25.28 kg/(m^2).  General Appearance: Fairly Groomed  Patent attorney:: Good  Speech: Clear and Coherent  Volume: Normal  Mood: Euthymic  Affect: Appropriate  Thought Process: Goal Directed  Orientation: Full (Time, Place, and Person)  Thought Content: Negative  Suicidal Thoughts: No  Homicidal Thoughts: No Memory: Immediate; Fair  Recent; Fair  Remote; Fair  Judgement: Fair  Insight: Fair  Psychomotor Activity: Normal  Concentration: Fair  Recall: [employer] of Knowledge:Good  Language: Good  Akathisia:  No  Handed: Right  AIMS (if indicated): Assets: Communication Skills  Desire for Improvement  Physical Health  Sleep: Number of Hours: 4.25   Past Psychiatric History: Diagnosis: Bipolar 1 disorder  Hospitalizations: [facility] adult unit  Outpatient Care: [facility]  Substance Abuse Care: NA  Self-Mutilation: NA  Suicidal Attempts: NA  Violent Behaviors: NA   Musculoskeletal: Strength & Muscle Tone: within normal limits Gait & Station: normal Patient leans: N/A  DSM5: Schizophrenia Disorders:  NA Obsessive-Compulsive Disorders:  NA Trauma-Stressor Disorders:  NA Substance/Addictive Disorders:  NA Depressive Disorders:  Bipolar 1 disorder  Axis Diagnosis:  AXIS I:  Bipolar 1 disorder AXIS II:  Deferred AXIS III:   Past Medical History  Diagnosis Date  . Hypertension   . PTSD (post-traumatic stress disorder)   . Bipolar 1 disorder    AXIS IV:  other psychosocial or environmental problems and Mental illness, chronic AXIS V:  63  Level of Care:  OP  Hospital Course: Patient was brought in to ED as well as later on admitted for paranoia and agitation. [place]. He reports that he was at a store when he felt paranoid. He also reports feeling sad,having redcued appetite.  While a patient in this hospital, Tristan [name] received medication management for his mood disorder symptoms. He was medicated and discharged on Risperdal 2 mg for mood control, Depakote 250 mg for mood stabilization, Cogentin 0.5 mg for prevention of EPS, Hydroxyzine 25 mg as needed for anxiety and Trazodone 50 mg for insomnia. He was also enrolled in and participated in the group counseling sessions being offered and held on this unit. He learned coping skills. Depakote  level upon discharge; 49.2.  Less responded well to his treatment regimen and his mood is stable. This is evidenced by his reports of improved mood and absence of paranoid ideations. He is currently  being discharged to continue treatment at the [facility], here in [place], [place]. He is provided with all the pertinent information required to make this appointment without problems. Upon discharge, he adamantly denies any SIHI, AVH, delusional thoughts and or paranoia. He received from the [facility] pharmacy, a 14 days worth, supply samples of his [facility] discharge medications. He left [facility] [place] with all belongings in no distress. Transportation per city bus. [facility] provided bus pass.  Consults:  psychiatry  Significant Diagnostic Studies:  labs: CBC with diff, CMP, UDS, toxicology tests, U/A, Depakote level  Discharge Vitals:   Blood pressure 123/80, pulse 79, temperature 98 F (36.7 C), temperature source Oral, resp. rate 16, height 5' 9.75" (1.772 m), weight 79.379 kg (175 lb). Body mass index is 25.28 kg/(m^2). Lab Results:   Results for orders placed during the hospital encounter of 05/02/14 (from the past 72 hour(s))  VALPROIC ACID LEVEL     Status: Abnormal   Collection Time    05/06/14  6:11 AM      Result Value Ref Range   Valproic Acid Lvl 42.9 (*) 50.0 - 100.0 ug/mL   Comment: Performed at [facility]   Physical Findings: AIMS: Facial and Oral Movements Muscles of Facial Expression: None, normal Lips and Perioral Area: None, normal Jaw: None, normal Tongue: None, normal,Extremity Movements Upper (arms, wrists, hands, fingers): None, normal Lower (legs, knees, ankles, toes): None, normal, Trunk Movements Neck, shoulders, hips: None, normal, Overall Severity Severity of abnormal movements (highest score from questions above): None, normal Incapacitation due to abnormal movements: None, normal Patient's awareness of abnormal movements (rate only patient's report): No Awareness, Dental Status Current problems with teeth and/or dentures?: No Does patient usually wear dentures?: No  CIWA:    COWS:     Psychiatric Specialty Exam: See Psychiatric Specialty Exam and Suicide Risk  Assessment completed by Attending Physician prior to discharge.  Discharge destination:  Home  Is patient on multiple antipsychotic therapies at discharge:  No   Has Patient had three or more failed trials of antipsychotic monotherapy by history:  No  Recommended Plan for Multiple Antipsychotic Therapies: NA    Medication List       Indication   benztropine 0.5 MG tablet  Commonly known as:  COGENTIN  Take 1 tablet (0.5 mg total) by mouth 2 (two) times daily. For prevention of drug induced involuntary movements.   Indication:  Extrapyramidal Reaction caused by Medications     divalproex 250 MG DR tablet  Commonly known as:  DEPAKOTE  Take 1 tablet (250 mg total) by mouth every 12 (twelve) hours. For mood stabilization   Indication:  Mood stabilization     hydrOXYzine 25 MG tablet  Commonly known as:  ATARAX/VISTARIL  Take 1 tablet (25 mg) three times daily as needed   Indication:  Tension, Anxiety     risperiDONE 2 MG tablet  Commonly known as:  RISPERDAL  Take 1 tablet (2 mg total) by mouth 2 (two) times daily. For mood control   Indication:  Mood control     traZODone 50 MG tablet  Commonly known as:  DESYREL  Take 1 tablet (50 mg total) by mouth at bedtime. For sleep   Indication:  Trouble Sleeping       Follow-up Information  Follow up with [facility]. (Walk in between 8am-9am Monday through Friday for hospital follow-up/medication management/assessment for therapy services. )    Contact information:   [address] [place], [place] [address] Phone: [phone] Fax: [phone]     Follow-up recommendations:  Activity:  As tolerated Diet: As recommended by your primary care doctor. Keep all scheduled follow-up appointments as recommended.  Comments:  Take all your medications as prescribed by your mental healthcare provider. Report any adverse effects and or reactions from your medicines to your outpatient provider promptly. Patient is instructed and  cautioned to not engage in alcohol and or illegal drug use while on prescription medicines. In the event of worsening symptoms, patient is instructed to call the crisis hotline, 911 and or go to the nearest ED for appropriate evaluation and treatment of symptoms. Follow-up with your primary care provider for your other medical issues, concerns and or health care needs.   Total Discharge Time:  Greater than 30 minutes.  Signed: Sanjuana Kavawoko, Agnes I, PMHNP-BC 05/07/2014, 1:28 PM  Patient seen, evaluated and I agree with notes by Nurse Practitioner. Thedore MinsMojeed Marley Pakula, MD

## 2014-05-06 NOTE — [facility] Suicide Risk Assessment (Signed)
   Demographic Factors:  Male, Low socioeconomic status and Unemployed  Total Time spent with patient: 20 minutes  Psychiatric Specialty Exam: Physical Exam  Psychiatric: He has a normal mood and affect. His speech is normal and behavior is normal. Judgment and thought content normal. Cognition and memory are normal.    Review of Systems  Constitutional: Negative.   HENT: Negative.   Eyes: Negative.   Respiratory: Negative.   Cardiovascular: Negative.   Gastrointestinal: Negative.   Genitourinary: Negative.   Musculoskeletal: Negative.   Skin: Negative.   Neurological: Negative.   Endo/Heme/Allergies: Negative.   Psychiatric/Behavioral: Negative.     Blood pressure 121/63, pulse 72, temperature 98.5 F (36.9 C), temperature source Oral, resp. rate 16, height 5' 9.75" (1.772 m), weight 79.379 kg (175 lb).Body mass index is 25.28 kg/(m^2).  General Appearance: Fairly Groomed  Patent attorneyye Contact::  Good  Speech:  Clear and Coherent  Volume:  Normal  Mood:  Euthymic  Affect:  Appropriate  Thought Process:  Goal Directed  Orientation:  Full (Time, Place, and Person)  Thought Content:  Negative  Suicidal Thoughts:  No  Homicidal Thoughts:  No  Memory:  Immediate;   Fair Recent;   Fair Remote;   Fair  Judgement:  Fair  Insight:  Fair  Psychomotor Activity:  Normal  Concentration:  Fair  Recall:  [employer]  Fund of Knowledge:Good  Language: Good  Akathisia:  No  Handed:  Right  AIMS (if indicated):     Assets:  Communication Skills Desire for Improvement Physical Health  Sleep:  Number of Hours: 4.25    Musculoskeletal: Strength & Muscle Tone: within normal limits Gait & Station: normal Patient leans: N/A   Mental Status Per Nursing Assessment::   On Admission:  NA  Current Mental Status by Physician: patient denies suicidal ideation, intent or plan  Loss Factors: Financial problems/change in socioeconomic status  Historical Factors: NA  Risk Reduction Factors:    Sense of responsibility to family, Living with another person, especially a relative and Positive social support  Continued Clinical Symptoms:  Resolving mood disorder  Cognitive Features That Contribute To Risk:  Closed-mindedness    Suicide Risk:  Minimal: No identifiable suicidal ideation.  Patients presenting with no risk factors but with morbid ruminations; may be classified as minimal risk based on the severity of the depressive symptoms  Discharge Diagnoses:   AXIS I:  Bipolar 1 disorder, mixed  AXIS II:  Cluster B Traits AXIS III:   Past Medical History  Diagnosis Date  . Hypertension   . PTSD (post-traumatic stress disorder)   . Bipolar 1 disorder    AXIS IV:  other psychosocial or environmental problems, problems related to social environment and problems with primary support group AXIS V:  61-70 mild symptoms  Plan Of Care/Follow-up recommendations:  Activity:  as tolerated Diet:  healthy Tests:  Valproic acid level: 42.9 Other:  patient to keep his after care appointment  Is patient on multiple antipsychotic therapies at discharge:  No   Has Patient had three or more failed trials of antipsychotic monotherapy by history:  No  Recommended Plan for Multiple Antipsychotic Therapies: NA    Thedore MinsAkintayo, Bostyn Bogie, MD 05/06/2014, 9:56 AM

## 2014-05-11 NOTE — Progress Notes (Signed)
Patient Discharge Instructions:  After Visit Summary (AVS):   Faxed to:  05/11/14 Discharge Summary Note:   Faxed to:  05/11/14 Psychiatric Admission Assessment Note:   Faxed to:  05/11/14 Suicide Risk Assessment - Discharge Assessment:   Faxed to:  05/11/14 Faxed/Sent to the Next Level Care provider:  05/11/14 [facility] @ [phone]  Jerelene ReddenSheena E [place], 05/11/2014, 3:59 PM

## 2019-01-12 ENCOUNTER — Emergency Department (HOSPITAL_COMMUNITY)
Admission: EM | Admit: 2019-01-12 | Discharge: 2019-01-12 | Discharge status: Against Medical Advice | Payer: Self-pay | Attending: Emergency Medicine | Admitting: Emergency Medicine

## 2019-01-12 ENCOUNTER — Other Ambulatory Visit: Payer: Self-pay

## 2019-01-12 ENCOUNTER — Encounter (HOSPITAL_COMMUNITY): Payer: Self-pay

## 2019-01-12 DIAGNOSIS — F333 Major depressive disorder, recurrent, severe with psychotic symptoms: Secondary | ICD-10-CM | POA: Insufficient documentation

## 2019-01-12 DIAGNOSIS — Y9289 Other specified places as the place of occurrence of the external cause: Secondary | ICD-10-CM | POA: Insufficient documentation

## 2019-01-12 DIAGNOSIS — T148XXA Other injury of unspecified body region, initial encounter: Secondary | ICD-10-CM | POA: Insufficient documentation

## 2019-01-12 DIAGNOSIS — Y9389 Activity, other specified: Secondary | ICD-10-CM | POA: Insufficient documentation

## 2019-01-12 DIAGNOSIS — F17210 Nicotine dependence, cigarettes, uncomplicated: Secondary | ICD-10-CM | POA: Insufficient documentation

## 2019-01-12 DIAGNOSIS — X788XXA Intentional self-harm by other sharp object, initial encounter: Secondary | ICD-10-CM | POA: Insufficient documentation

## 2019-01-12 DIAGNOSIS — F4310 Post-traumatic stress disorder, unspecified: Secondary | ICD-10-CM | POA: Insufficient documentation

## 2019-01-12 DIAGNOSIS — Z7289 Other problems related to lifestyle: Secondary | ICD-10-CM

## 2019-01-12 DIAGNOSIS — Y999 Unspecified external cause status: Secondary | ICD-10-CM | POA: Insufficient documentation

## 2019-01-12 DIAGNOSIS — Z5320 Procedure and treatment not carried out because of patient's decision for unspecified reasons: Secondary | ICD-10-CM | POA: Insufficient documentation

## 2019-01-12 DIAGNOSIS — IMO0002 Reserved for concepts with insufficient information to code with codable children: Secondary | ICD-10-CM

## 2019-01-12 LAB — CBC WITH DIFFERENTIAL/PLATELET
Abs Immature Granulocytes: 0.02 10*3/uL (ref 0.00–0.07)
Basophils Absolute: 0.1 10*3/uL (ref 0.0–0.1)
Basophils Relative: 1 %
Eosinophils Absolute: 0 10*3/uL (ref 0.0–0.5)
Eosinophils Relative: 0 %
HCT: 36.5 % — ABNORMAL LOW (ref 39.0–52.0)
Hemoglobin: 12.5 g/dL — ABNORMAL LOW (ref 13.0–17.0)
Immature Granulocytes: 0 %
Lymphocytes Relative: 10 %
Lymphs Abs: 1 10*3/uL (ref 0.7–4.0)
MCH: 26.9 pg (ref 26.0–34.0)
MCHC: 34.2 g/dL (ref 30.0–36.0)
MCV: 78.7 fL — ABNORMAL LOW (ref 80.0–100.0)
Monocytes Absolute: 0.5 10*3/uL (ref 0.1–1.0)
Monocytes Relative: 4 %
Neutro Abs: 8.9 10*3/uL — ABNORMAL HIGH (ref 1.7–7.7)
Neutrophils Relative %: 85 %
Platelets: 350 10*3/uL (ref 150–400)
RBC: 4.64 MIL/uL (ref 4.22–5.81)
RDW: 14.6 % (ref 11.5–15.5)
WBC: 10.5 10*3/uL (ref 4.0–10.5)
nRBC: 0 % (ref 0.0–0.2)

## 2019-01-12 LAB — COMPREHENSIVE METABOLIC PANEL
ALT: 14 U/L (ref 0–44)
AST: 19 U/L (ref 15–41)
Albumin: 4.4 g/dL (ref 3.5–5.0)
Alkaline Phosphatase: 56 U/L (ref 38–126)
Anion gap: 8 (ref 5–15)
BUN: 12 mg/dL (ref 6–20)
CO2: 23 mmol/L (ref 22–32)
Calcium: 9.3 mg/dL (ref 8.9–10.3)
Chloride: 106 mmol/L (ref 98–111)
Creatinine, Ser: 1.1 mg/dL (ref 0.61–1.24)
GFR calc Af Amer: >60 mL/min (ref >60)
GFR calc non Af Amer: >60 mL/min (ref >60)
Glucose, Bld: 94 mg/dL (ref 70–99)
Potassium: 3.9 mmol/L (ref 3.5–5.1)
Sodium: 137 mmol/L (ref 135–145)
Total Bilirubin: 0.8 mg/dL (ref 0.3–1.2)
Total Protein: 7.3 g/dL (ref 6.5–8.1)

## 2019-01-12 LAB — ETHANOL: Alcohol, Ethyl (B): <10 mg/dL (ref <10)

## 2019-01-12 MED ORDER — TETANUS-DIPHTH-ACELL PERTUSSIS 5-2.5-18.5 LF-MCG/0.5 IM SUSP: 0.5000 mL | Freq: Once | INTRAMUSCULAR | Status: DC

## 2019-01-12 NOTE — [facility] Counselor (Signed)
Per Tristan [name], [facility] patient has been accepted to [phone]. Patient may come ASAP.
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
## 2019-01-12 NOTE — BHH Counselor (Signed)
Chart is duplicated. See Myna Hidalgo [id number]. Misty Stanley, Diplomatic Services operational officer made aware.

## 2019-01-12 NOTE — ED Notes (Signed)
Report called to [facility] RN
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
## 2019-01-12 NOTE — ED Notes (Signed)
Bed: SL75 Expected date:  Expected time:  Means of arrival:  Comments: EMS dog bite, SI w/o plan

## 2019-01-12 NOTE — ED Notes (Signed)
Pt was accepted, informed and prepared for transport to [facility], report called to Henriette Combs at [facility], [facility] transport called for P/U. Upon [facility] arrival, when entering room to transfer Pt to [facility], Pt had eloped. Marchelle Folks at [facility] was called and updated, ED Charge was informed.
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
## 2019-01-13 ENCOUNTER — Encounter (HOSPITAL_COMMUNITY): Payer: Self-pay | Admitting: Psychiatry

## 2019-01-26 ENCOUNTER — Encounter (HOSPITAL_COMMUNITY): Payer: Self-pay | Admitting: Emergency Medicine

## 2019-01-26 ENCOUNTER — Other Ambulatory Visit: Payer: Self-pay

## 2019-01-26 ENCOUNTER — Emergency Department (HOSPITAL_COMMUNITY)
Admission: EM | Admit: 2019-01-26 | Discharge: 2019-01-27 | Disposition: A | Discharge status: Other Acute Inpatient Hospital | Payer: Self-pay | Attending: Emergency Medicine | Admitting: Emergency Medicine

## 2019-01-26 DIAGNOSIS — F329 Major depressive disorder, single episode, unspecified: Secondary | ICD-10-CM | POA: Insufficient documentation

## 2019-01-26 DIAGNOSIS — R45851 Suicidal ideations: Secondary | ICD-10-CM | POA: Insufficient documentation

## 2019-01-26 DIAGNOSIS — F4310 Post-traumatic stress disorder, unspecified: Secondary | ICD-10-CM | POA: Insufficient documentation

## 2019-01-26 DIAGNOSIS — Z79899 Other long term (current) drug therapy: Secondary | ICD-10-CM | POA: Insufficient documentation

## 2019-01-26 DIAGNOSIS — T1491XA Suicide attempt, initial encounter: Secondary | ICD-10-CM | POA: Diagnosis present

## 2019-01-26 DIAGNOSIS — Z1159 Encounter for screening for other viral diseases: Secondary | ICD-10-CM | POA: Insufficient documentation

## 2019-01-26 DIAGNOSIS — X810XXA Intentional self-harm by jumping or lying in front of motor vehicle, initial encounter: Secondary | ICD-10-CM | POA: Insufficient documentation

## 2019-01-26 LAB — CBC WITH DIFFERENTIAL/PLATELET
Abs Immature Granulocytes: 0.03 10*3/uL (ref 0.00–0.07)
Basophils Absolute: 0.1 10*3/uL (ref 0.0–0.1)
Basophils Relative: 1 %
Eosinophils Absolute: 0.2 10*3/uL (ref 0.0–0.5)
Eosinophils Relative: 3 %
HCT: 37.5 % — ABNORMAL LOW (ref 39.0–52.0)
Hemoglobin: 12.5 g/dL — ABNORMAL LOW (ref 13.0–17.0)
Immature Granulocytes: 0 %
Lymphocytes Relative: 39 %
Lymphs Abs: 3 10*3/uL (ref 0.7–4.0)
MCH: 26.6 pg (ref 26.0–34.0)
MCHC: 33.3 g/dL (ref 30.0–36.0)
MCV: 79.8 fL — ABNORMAL LOW (ref 80.0–100.0)
Monocytes Absolute: 0.5 10*3/uL (ref 0.1–1.0)
Monocytes Relative: 6 %
Neutro Abs: 3.9 10*3/uL (ref 1.7–7.7)
Neutrophils Relative %: 51 %
Platelets: 264 10*3/uL (ref 150–400)
RBC: 4.7 MIL/uL (ref 4.22–5.81)
RDW: 15 % (ref 11.5–15.5)
WBC: 7.7 10*3/uL (ref 4.0–10.5)
nRBC: 0 % (ref 0.0–0.2)

## 2019-01-26 LAB — COMPREHENSIVE METABOLIC PANEL
ALT: 14 U/L (ref 0–44)
AST: 21 U/L (ref 15–41)
Albumin: 3.9 g/dL (ref 3.5–5.0)
Alkaline Phosphatase: 45 U/L (ref 38–126)
Anion gap: 7 (ref 5–15)
BUN: 15 mg/dL (ref 6–20)
CO2: 25 mmol/L (ref 22–32)
Calcium: 8.9 mg/dL (ref 8.9–10.3)
Chloride: 108 mmol/L (ref 98–111)
Creatinine, Ser: 0.99 mg/dL (ref 0.61–1.24)
GFR calc Af Amer: >60 mL/min (ref >60)
GFR calc non Af Amer: >60 mL/min (ref >60)
Glucose, Bld: 68 mg/dL — ABNORMAL LOW (ref 70–99)
Potassium: 3.7 mmol/L (ref 3.5–5.1)
Sodium: 140 mmol/L (ref 135–145)
Total Bilirubin: 0.4 mg/dL (ref 0.3–1.2)
Total Protein: 6.5 g/dL (ref 6.5–8.1)

## 2019-01-26 LAB — RAPID URINE DRUG SCREEN, HOSP PERFORMED
Amphetamines: POSITIVE — AB
Barbiturates: NOT DETECTED
Benzodiazepines: NOT DETECTED
Cocaine: POSITIVE — AB
Opiates: NOT DETECTED
Tetrahydrocannabinol: POSITIVE — AB

## 2019-01-26 LAB — SARS CORONAVIRUS 2 BY RT PCR (HOSPITAL ORDER, PERFORMED IN ~~LOC~~ HOSPITAL LAB): SARS Coronavirus 2: NEGATIVE

## 2019-01-26 LAB — ETHANOL: Alcohol, Ethyl (B): <10 mg/dL (ref <10)

## 2019-01-26 LAB — ACETAMINOPHEN LEVEL: Acetaminophen (Tylenol), Serum: <10 ug/mL — ABNORMAL LOW (ref 10–30)

## 2019-01-26 MED ORDER — IBUPROFEN 200 MG PO TABS
600.0000 mg | ORAL_TABLET | Freq: Three times a day (TID) | ORAL | Status: DC | PRN
  Administered 2019-01-26: 600 mg via ORAL
  Filled 2019-01-26: qty 3

## 2019-01-26 NOTE — ED Notes (Signed)
Spencer Simon, PA, patient meets inpatient criteria. [facility], AC, no appropriate beds available. TTS to secure placement. Latricia, RN, informed of disposition.  
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
## 2019-01-26 NOTE — ED Notes (Signed)
Pt A&O x 3, presents with SI, laid in front of Bus yesterday,  Feeling hopeless and like a failure he reports.  Denies HI, Positive AVH, seeing spirits and demons.  Pt reports he is homeless.  Admits to previous SI attempts, 2 wks ago cut Left arm with razor.  Monitoring for safety, sitter at bedside.
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
## 2019-01-27 ENCOUNTER — Encounter (HOSPITAL_COMMUNITY): Payer: Self-pay

## 2019-01-27 ENCOUNTER — Inpatient Hospital Stay (HOSPITAL_COMMUNITY): Admission: AD | Admit: 2019-01-27 | Payer: No Typology Code available for payment source | Admitting: Psychiatry

## 2019-01-27 ENCOUNTER — Other Ambulatory Visit: Payer: Self-pay

## 2019-01-27 ENCOUNTER — Inpatient Hospital Stay (HOSPITAL_COMMUNITY)
Admission: AD | Admit: 2019-01-27 | Discharge: 2019-01-29 | DRG: 885 | Disposition: A | Discharge status: Home / Self Care | Payer: No Typology Code available for payment source | Source: Intra-hospital | Attending: Psychiatry | Admitting: Psychiatry

## 2019-01-27 DIAGNOSIS — R45851 Suicidal ideations: Secondary | ICD-10-CM | POA: Diagnosis present

## 2019-01-27 DIAGNOSIS — F322 Major depressive disorder, single episode, severe without psychotic features: Secondary | ICD-10-CM | POA: Diagnosis present

## 2019-01-27 DIAGNOSIS — Z590 Homelessness: Secondary | ICD-10-CM

## 2019-01-27 DIAGNOSIS — F17210 Nicotine dependence, cigarettes, uncomplicated: Secondary | ICD-10-CM | POA: Diagnosis present

## 2019-01-27 DIAGNOSIS — T1491XA Suicide attempt, initial encounter: Secondary | ICD-10-CM

## 2019-01-27 DIAGNOSIS — F1910 Other psychoactive substance abuse, uncomplicated: Secondary | ICD-10-CM | POA: Diagnosis present

## 2019-01-27 DIAGNOSIS — F319 Bipolar disorder, unspecified: Secondary | ICD-10-CM | POA: Diagnosis present

## 2019-01-27 DIAGNOSIS — F339 Major depressive disorder, recurrent, unspecified: Secondary | ICD-10-CM

## 2019-01-27 MED ORDER — OLANZAPINE 5 MG PO TBDP
5.0000 mg | ORAL_TABLET | Freq: Three times a day (TID) | ORAL | Status: DC | PRN
  Administered 2019-01-29: 5 mg via ORAL
  Filled 2019-01-27: qty 1

## 2019-01-27 MED ORDER — NICOTINE 21 MG/24HR TD PT24
21.0000 mg | MEDICATED_PATCH | Freq: Every day | TRANSDERMAL | Status: DC
  Administered 2019-01-28 – 2019-01-29 (×2): 21 mg via TRANSDERMAL
  Filled 2019-01-27 (×7): qty 1

## 2019-01-27 MED ORDER — LIDOCAINE VISCOUS HCL 2 % MT SOLN
15.0000 mL | Freq: Once | OROMUCOSAL | Status: AC
  Administered 2019-01-27: 15 mL via ORAL
  Filled 2019-01-27 (×2): qty 15

## 2019-01-27 MED ORDER — ALUM & MAG HYDROXIDE-SIMETH 200-200-20 MG/5ML PO SUSP
30.0000 mL | Freq: Once | ORAL | Status: AC
  Administered 2019-01-27: 30 mL via ORAL
  Filled 2019-01-27: qty 30

## 2019-01-27 MED ORDER — TRAZODONE HCL 50 MG PO TABS
50.0000 mg | ORAL_TABLET | Freq: Every evening | ORAL | Status: DC | PRN
  Administered 2019-01-27 – 2019-01-28 (×2): 50 mg via ORAL
  Filled 2019-01-27 (×13): qty 1

## 2019-01-27 MED ORDER — ENSURE ENLIVE PO LIQD: 237.0000 mL | Freq: Two times a day (BID) | ORAL | Status: DC

## 2019-01-27 MED ORDER — LORAZEPAM 1 MG PO TABS: 1.0000 mg | ORAL_TABLET | ORAL | Status: DC | PRN

## 2019-01-27 MED ORDER — HYDROXYZINE HCL 25 MG PO TABS
25.0000 mg | ORAL_TABLET | Freq: Four times a day (QID) | ORAL | Status: DC | PRN
  Administered 2019-01-27 – 2019-01-28 (×2): 25 mg via ORAL
  Filled 2019-01-27 (×3): qty 1

## 2019-01-27 MED ORDER — ZIPRASIDONE MESYLATE 20 MG IM SOLR: 20.0000 mg | INTRAMUSCULAR | Status: DC | PRN

## 2019-01-27 NOTE — ED Notes (Signed)
Transported to [facility] by [employer]. All belongings returned to pt. Pt was calm and cooperative.  
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
## 2019-01-27 NOTE — Progress Notes (Addendum)
Tristan [name] did not attend wrap-up group for he was asleep. Pt woke up later in the evening for snakes/fluids; which was given to him. Pt denies SI/HI/AVH/Pain at present. Pt was animated/anxious/silly in affect and mood. No new c/o's. Provider on call notified for PRNs. Trazodone and vistaril offered and accepted. Will continue with POC.
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
## 2019-01-27 NOTE — Progress Notes (Signed)
[place] NOVEL CORONAVIRUS (COVID-19) DAILY CHECK-OFF SYMPTOMS - answer yes or no to each - every day NO YES  Have you had a fever in the past 24 hours?  . Fever (Temp > 37.80C / 100F) X   Have you had any of these symptoms in the past 24 hours? . New Cough .  Sore Throat  .  Shortness of Breath .  Difficulty Breathing .  Unexplained Body Aches   X   Have you had any one of these symptoms in the past 24 hours not related to allergies?   . Runny Nose .  Nasal Congestion .  Sneezing   X   If you have had runny nose, nasal congestion, sneezing in the past 24 hours, has it worsened?  X   EXPOSURES - check yes or no X   Have you traveled outside the state in the past 14 days?  X   Have you been in contact with someone with a confirmed diagnosis of COVID-19 or PUI in the past 14 days without wearing appropriate PPE?  X   Have you been living in the same home as a person with confirmed diagnosis of COVID-19 or a PUI (household contact)?    X   Have you been diagnosed with COVID-19?    X              What to do next: Answered NO to all: Answered YES to anything:   Proceed with unit schedule Follow the [facility] Inpatient Flowsheet.   

## 2019-01-27 NOTE — ED Notes (Signed)
Report given to Alyssa at [facility]. [employer] called for transport.

## 2019-01-27 NOTE — Tx Team (Signed)
Initial Treatment Plan 01/27/2019 4:08 PM Tristan [name] [id number]    PATIENT STRESSORS: Legal issue Substance abuse   PATIENT STRENGTHS: Ability for insight General fund of knowledge Motivation for treatment/growth   PATIENT IDENTIFIED PROBLEMS: "coping skills"  "housing"                   DISCHARGE CRITERIA:  Ability to meet basic life and health needs Adequate post-discharge living arrangements Improved stabilization in mood, thinking, and/or behavior  PRELIMINARY DISCHARGE PLAN: Outpatient therapy  PATIENT/FAMILY INVOLVEMENT: This treatment plan has been presented to and reviewed with the patient, Tristan [name]. The patient and family have been given the opportunity to ask questions and make suggestions.  Dewayne Shorter, RN 01/27/2019, 4:08 PM

## 2019-01-27 NOTE — BH Assessment (Signed)
[facility] Assessment Progress Note  Per Juanetta Beets, DO, this pt requires psychiatric hospitalization.  Malva Limes, RN, [facility] has assigned pt to [facility] Rm [id number].  Dr Sharma Covert also finds that pt meets criteria for IVC.  EDP Lorre Nick, MD concurs with this finding and has initiated IVC.  IVC documents have been faxed to [facility], and at [employer] confirms receipt.  She has since faxed Findings and Custody Order to this Clinical research associate.  At 12:15 I called [employer] and spoke to [employer], who took demographic information, agreeing to dispatch law enforcement to fill out Return of Service.  As of this writing arrival of law enforcement is pending.  Petition and First Examination have been faxed to [facility].  Pt's nurse, Diane, has been notified, and agrees to call report to [phone].  Pt is to be transported via Patent examiner.   Doylene Canning, [place] Behavioral Health Coordinator [phone]

## 2019-01-28 DIAGNOSIS — F322 Major depressive disorder, single episode, severe without psychotic features: Secondary | ICD-10-CM

## 2019-01-28 MED ORDER — FLUOXETINE HCL 20 MG PO CAPS
20.0000 mg | ORAL_CAPSULE | Freq: Every day | ORAL | Status: DC
  Administered 2019-01-28 – 2019-01-29 (×2): 20 mg via ORAL
  Filled 2019-01-28: qty 1
  Filled 2019-01-28 (×3): qty 14
  Filled 2019-01-28: qty 1
  Filled 2019-01-28: qty 14
  Filled 2019-01-28: qty 1

## 2019-01-28 MED ORDER — ENSURE ENLIVE PO LIQD: 237.0000 mL | Freq: Every day | ORAL | Status: DC | PRN

## 2019-01-28 MED ORDER — ADULT MULTIVITAMIN W/MINERALS CH
1.0000 | ORAL_TABLET | Freq: Every day | ORAL | Status: DC
  Administered 2019-01-28 – 2019-01-29 (×2): 1 via ORAL
  Filled 2019-01-28 (×6): qty 1

## 2019-01-28 MED ORDER — BOOST / RESOURCE BREEZE PO LIQD CUSTOM
1.0000 | ORAL | Status: DC
  Administered 2019-01-28 – 2019-01-29 (×2): 1 via ORAL
  Filled 2019-01-28 (×6): qty 1

## 2019-01-28 MED ORDER — GABAPENTIN 300 MG PO CAPS
300.0000 mg | ORAL_CAPSULE | Freq: Three times a day (TID) | ORAL | Status: DC
  Administered 2019-01-28 – 2019-01-29 (×2): 300 mg via ORAL
  Filled 2019-01-28: qty 42
  Filled 2019-01-28 (×2): qty 1
  Filled 2019-01-28 (×5): qty 42
  Filled 2019-01-28 (×2): qty 1
  Filled 2019-01-28 (×4): qty 42
  Filled 2019-01-28: qty 1
  Filled 2019-01-28: qty 42
  Filled 2019-01-28: qty 1
  Filled 2019-01-28: qty 42

## 2019-01-28 NOTE — Progress Notes (Signed)
   01/28/19 2335  COVID-19 Daily Checkoff  Have you had a fever (temp > 37.80C/100F)  in the past 24 hours?  No  If you have had runny nose, nasal congestion, sneezing in the past 24 hours, has it worsened? No  COVID-19 EXPOSURE  Have you traveled outside the state in the past 14 days? No  Have you been in contact with someone with a confirmed diagnosis of COVID-19 or PUI in the past 14 days without wearing appropriate PPE? No  Have you been living in the same home as a person with confirmed diagnosis of COVID-19 or a PUI (household contact)? No  Have you been diagnosed with COVID-19? No

## 2019-01-28 NOTE — H&P (Signed)
Psychiatric Admission Assessment Adult  Patient Identification: Tristan [name] MRN:  [id number] Date of Evaluation:  01/28/2019 Chief Complaint:  MDD RECURRENT WITH PSYCHOTIC FEATURE Principal Diagnosis: Phonic polysubstance abuse/history of dependency/homelessness/depressive complaints Diagnosis:  Active Problems:   MDD (major depressive disorder), severe (HCC)  History of Present Illness:   This 33 year old patient is admitted on an involuntary basis he has a history of polysubstance abuse, sobriety during incarceration but released from prison in February, general homelessness, and recently was charged with impeding traffic he was lying under her bus he stated he was suicidal he was jailed after this it seemed to be a stunt for housing purposes but then he re-presented reporting suicidal thoughts, he actually leap from a [employer] Niece that was transporting him here on a prior attempted admission this week however that incident he reports he has no recall of and was probably under the influence of drugs at that time. Drug screen positive on 5/5 for cocaine cannabis and amphetamines states used to he also be addicted to heroin.  He is now alert he is oriented to person place situation time is generally cordial on interview denies wanting to harm himself now can contract here can contract if he leaves.  Hopes to find some type of rehab and/or housing states he cannot stay with his father who has colon cancer and he does not get along with his mother Associated Signs/Symptoms: Depression Symptoms:  insomnia, (Hypo) Manic Symptoms:  n/a Anxiety Symptoms:  n/a Psychotic Symptoms:  State of psychosis recently driven by polysubstance intoxication most likely PTSD Symptoms: NA Total Time spent with patient: 45 minutes  Past Psychiatric History: States has been an addict all his life  Is the patient at risk to self? Yes.    Has the patient been a risk to self in the past 6 months? Yes.    Has the  patient been a risk to self within the distant past? Yes.    Is the patient a risk to others? No.  Has the patient been a risk to others in the past 6 months? No.  Has the patient been a risk to others within the distant past? No.   Alcohol Screening: 1. How often do you have a drink containing alcohol?: Never 2. How many drinks containing alcohol do you have on a typical day when you are drinking?: 1 or 2 3. How often do you have six or more drinks on one occasion?: Never AUDIT-C Score: 0 4. How often during the last year have you found that you were not able to stop drinking once you had started?: Never 5. How often during the last year have you failed to do what was normally expected from you becasue of drinking?: Never 6. How often during the last year have you needed a first drink in the morning to get yourself going after a heavy drinking session?: Never 7. How often during the last year have you had a feeling of guilt of remorse after drinking?: Never 8. How often during the last year have you been unable to remember what happened the night before because you had been drinking?: Never 9. Have you or someone else been injured as a result of your drinking?: No 10. Has a relative or friend or a doctor or another health worker been concerned about your drinking or suggested you cut down?: No Alcohol Use Disorder Identification Test Final Score (AUDIT): 0 Alcohol Brief Interventions/Follow-up: Patient Refused Substance Abuse History in the last 12 months:  Yes.   Consequences of Substance Abuse: NA Previous Psychotropic Medications: Yes  Psychological Evaluations: No  Past Medical History:  Past Medical History:  Diagnosis Date  . Bipolar 1 disorder (HCC)   . Hypertension   . PTSD (post-traumatic stress disorder)     Past Surgical History:  Procedure Laterality Date  . NO PAST SURGERIES     Family History:  Family History  Problem Relation Age of Onset  . Other Father   .  Psychiatric Illness Father    Family Psychiatric  History: neg Tobacco Screening: Have you used any form of tobacco in the last 30 days? (Cigarettes, Smokeless Tobacco, Cigars, and/or Pipes): Yes Tobacco use, Select all that apply: 5 or more cigarettes per day Are you interested in Tobacco Cessation Medications?: Yes, will notify MD for an order Counseled patient on smoking cessation including recognizing danger situations, developing coping skills and basic information about quitting provided: Yes Social History:  Social History   Substance and Sexual Activity  Alcohol Use Never  . Frequency: Never     Social History   Substance and Sexual Activity  Drug Use Never  . Types: Marijuana, Heroin   Comment: denies drug use    Additional Social History:                           Allergies:   Allergies  Allergen Reactions  . Tomato    Lab Results:  Results for orders placed or performed during the hospital encounter of 01/26/19 (from the past 48 hour(s))  Comprehensive metabolic panel     Status: Abnormal   Collection Time: 01/26/19  8:19 PM  Result Value Ref Range   Sodium 140 135 - 145 mmol/L   Potassium 3.7 3.5 - 5.1 mmol/L   Chloride 108 98 - 111 mmol/L   CO2 25 22 - 32 mmol/L   Glucose, Bld 68 (L) 70 - 99 mg/dL   BUN 15 6 - 20 mg/dL   Creatinine, Ser 7.82 0.61 - 1.24 mg/dL   Calcium 8.9 8.9 - 95.6 mg/dL   Total Protein 6.5 6.5 - 8.1 g/dL   Albumin 3.9 3.5 - 5.0 g/dL   AST 21 15 - 41 U/L   ALT 14 0 - 44 U/L   Alkaline Phosphatase 45 38 - 126 U/L   Total Bilirubin 0.4 0.3 - 1.2 mg/dL   GFR calc non Af Amer >60 >60 mL/min   GFR calc Af Amer >60 >60 mL/min   Anion gap 7 5 - 15    Comment: Performed at [facility], [address] [place], [place] [address]  Ethanol     Status: None   Collection Time: 01/26/19  8:19 PM  Result Value Ref Range   Alcohol, Ethyl (B) <10 <10 mg/dL    Comment: (NOTE) Lowest detectable limit for serum  alcohol is 10 mg/dL. For medical purposes only. Performed at [facility], [address] [place], [place] [address]   CBC with Diff     Status: Abnormal   Collection Time: 01/26/19  8:19 PM  Result Value Ref Range   WBC 7.7 4.0 - 10.5 K/uL   RBC 4.70 4.22 - 5.81 MIL/uL   Hemoglobin 12.5 (L) 13.0 - 17.0 g/dL   HCT 69.6 (L) 29.5 - 28.4 %   MCV 79.8 (L) 80.0 - 100.0 fL   MCH 26.6 26.0 - 34.0 pg   MCHC 33.3 30.0 - 36.0 g/dL  RDW 15.0 11.5 - 15.5 %   Platelets 264 150 - 400 K/uL   nRBC 0.0 0.0 - 0.2 %   Neutrophils Relative % 51 %   Neutro Abs 3.9 1.7 - 7.7 K/uL   Lymphocytes Relative 39 %   Lymphs Abs 3.0 0.7 - 4.0 K/uL   Monocytes Relative 6 %   Monocytes Absolute 0.5 0.1 - 1.0 K/uL   Eosinophils Relative 3 %   Eosinophils Absolute 0.2 0.0 - 0.5 K/uL   Basophils Relative 1 %   Basophils Absolute 0.1 0.0 - 0.1 K/uL   Immature Granulocytes 0 %   Abs Immature Granulocytes 0.03 0.00 - 0.07 K/uL    Comment: Performed at [facility], [address] [place], [place] [address]  Acetaminophen level     Status: Abnormal   Collection Time: 01/26/19  8:19 PM  Result Value Ref Range   Acetaminophen (Tylenol), Serum <10 (L) 10 - 30 ug/mL    Comment: (NOTE) Therapeutic concentrations vary significantly. A range of 10-30 ug/mL  may be an effective concentration for many patients. However, some  are best treated at concentrations outside of this range. Acetaminophen concentrations >150 ug/mL at 4 hours after ingestion  and >50 ug/mL at 12 hours after ingestion are often associated with  toxic reactions. Performed at [facility], [address] [place], [place] [address]   Urine rapid drug screen (hosp performed)     Status: Abnormal   Collection Time: 01/26/19  8:20 PM  Result Value Ref Range   Opiates NONE DETECTED NONE DETECTED   Cocaine POSITIVE (A) NONE DETECTED   Benzodiazepines NONE DETECTED NONE DETECTED    Amphetamines POSITIVE (A) NONE DETECTED   Tetrahydrocannabinol POSITIVE (A) NONE DETECTED   Barbiturates NONE DETECTED NONE DETECTED    Comment: (NOTE) DRUG SCREEN FOR MEDICAL PURPOSES ONLY.  IF CONFIRMATION IS NEEDED FOR ANY PURPOSE, NOTIFY LAB WITHIN 5 DAYS. LOWEST DETECTABLE LIMITS FOR URINE DRUG SCREEN Drug Class                     Cutoff (ng/mL) Amphetamine and metabolites    1000 Barbiturate and metabolites    200 Benzodiazepine                 200 Tricyclics and metabolites     300 Opiates and metabolites        300 Cocaine and metabolites        300 THC                            50 Performed at [facility], [address] [place], [place] [address]   SARS Coronavirus 2 (CEPHEID - Performed in [facility] hospital lab), Hosp Order     Status: None   Collection Time: 01/26/19  9:58 PM  Result Value Ref Range   SARS Coronavirus 2 NEGATIVE NEGATIVE    Comment: (NOTE) If result is NEGATIVE SARS-CoV-2 target nucleic acids are NOT DETECTED. The SARS-CoV-2 RNA is generally detectable in upper and lower  respiratory specimens during the acute phase of infection. The lowest  concentration of SARS-CoV-2 viral copies this assay can detect is 250  copies / mL. A negative result does not preclude SARS-CoV-2 infection  and should not be used as the sole basis for treatment or other  patient management decisions.  A negative result may occur with  improper specimen collection / handling, submission  of specimen other  than nasopharyngeal swab, presence of viral mutation(s) within the  areas targeted by this assay, and inadequate number of viral copies  (<250 copies / mL). A negative result must be combined with clinical  observations, patient history, and epidemiological information. If result is POSITIVE SARS-CoV-2 target nucleic acids are DETECTED. The SARS-CoV-2 RNA is generally detectable in upper and lower  respiratory specimens dur ing the acute phase of  infection.  Positive  results are indicative of active infection with SARS-CoV-2.  Clinical  correlation with patient history and other diagnostic information is  necessary to determine patient infection status.  Positive results do  not rule out bacterial infection or co-infection with other viruses. If result is PRESUMPTIVE POSTIVE SARS-CoV-2 nucleic acids MAY BE PRESENT.   A presumptive positive result was obtained on the submitted specimen  and confirmed on repeat testing.  While 2019 novel coronavirus  (SARS-CoV-2) nucleic acids may be present in the submitted sample  additional confirmatory testing may be necessary for epidemiological  and / or clinical management purposes  to differentiate between  SARS-CoV-2 and other Sarbecovirus currently known to infect humans.  If clinically indicated additional testing with an alternate test  methodology [phone]) is advised. The SARS-CoV-2 RNA is generally  detectable in upper and lower respiratory sp ecimens during the acute  phase of infection. The expected result is Negative. Fact Sheet for Patients:  BoilerBrush.com.cy Fact Sheet for Healthcare Providers: https://pope.com/ This test is not yet approved or cleared by the [place] FDA and has been authorized for detection and/or diagnosis of SARS-CoV-2 by FDA under an Emergency Use Authorization (EUA).  This EUA will remain in effect (meaning this test can be used) for the duration of the COVID-19 declaration under Section 564(b)(1) of the Act, 21 U.S.C. section 360bbb-3(b)(1), unless the authorization is terminated or revoked sooner. Performed at [facility], [address], [place], [place] [address]     Blood Alcohol level:  Lab Results  Component Value Date   ETH <10 01/26/2019   ETH <10 01/12/2019    Metabolic Disorder Labs:  No results found for: HGBA1C, MPG No results found for: PROLACTIN No  results found for: CHOL, TRIG, HDL, CHOLHDL, VLDL, LDLCALC  Current Medications: Current Facility-Administered Medications  Medication Dose Route Frequency Provider Last Rate Last Dose  . feeding supplement (BOOST / RESOURCE BREEZE) liquid 1 Container  1 Container Oral Q24H Antonieta Pert, MD   1 Container at 01/28/19 [phone]  . feeding supplement (ENSURE ENLIVE) (ENSURE ENLIVE) liquid 237 mL  237 mL Oral Daily PRN Antonieta Pert, MD      . FLUoxetine (PROZAC) capsule 20 mg  20 mg Oral Daily Malvin Johns, MD      . gabapentin (NEURONTIN) capsule 300 mg  300 mg Oral TID Malvin Johns, MD      . hydrOXYzine (ATARAX/VISTARIL) tablet 25 mg  25 mg Oral Q6H PRN [name] E, PA-C   25 mg at 01/27/19 2114  . OLANZapine zydis (ZYPREXA) disintegrating tablet 5 mg  5 mg Oral Q8H PRN Kerry Hough, PA-C       And  . LORazepam (ATIVAN) tablet 1 mg  1 mg Oral PRN [name] E, PA-C       And  . ziprasidone (GEODON) injection 20 mg  20 mg Intramuscular PRN Kerry Hough, PA-C      . multivitamin with minerals tablet 1 tablet  1 tablet Oral Daily Clary, Marlane Mingle, MD  1 tablet at 01/28/19 0812  . nicotine (NICODERM CQ - dosed in mg/24 hours) patch 21 mg  21 mg Transdermal Daily Antonieta Pert, MD   21 mg at 01/28/19 9449  . traZODone (DESYREL) tablet 50 mg  50 mg Oral QHS,MR X 1 Kerry Hough, PA-C   50 mg at 01/27/19 2114   PTA Medications: Medications Prior to Admission  Medication Sig Dispense Refill Last Dose  . benztropine (COGENTIN) 0.5 MG tablet Take 1 tablet (0.5 mg total) by mouth 2 (two) times daily. For prevention of drug induced involuntary movements. (Patient not taking: Reported on 01/26/2019) 60 tablet 0 Not Taking at Unknown time  . divalproex (DEPAKOTE) 250 MG DR tablet Take 1 tablet (250 mg total) by mouth every 12 (twelve) hours. For mood stabilization (Patient not taking: Reported on 01/26/2019) 60 tablet 0 Not Taking at Unknown time  . hydrOXYzine  (ATARAX/VISTARIL) 25 MG tablet Take 1 tablet (25 mg) three times daily as needed (Patient not taking: Reported on 01/26/2019) 45 tablet 0 Not Taking at Unknown time  . risperiDONE (RISPERDAL) 2 MG tablet Take 1 tablet (2 mg total) by mouth 2 (two) times daily. For mood control (Patient not taking: Reported on 01/26/2019) 60 tablet 0 Not Taking at Unknown time  . traZODone (DESYREL) 50 MG tablet Take 1 tablet (50 mg total) by mouth at bedtime. For sleep (Patient not taking: Reported on 01/26/2019) 30 tablet 0 Not Taking at Unknown time    Musculoskeletal: Strength & Muscle Tone: within normal limits Gait & Station: normal Patient leans: N/A  Psychiatric Specialty Exam: Physical Exam  ROS  Blood pressure (!) 103/50, pulse (!) 55, temperature 97.8 F (36.6 C), temperature source Oral, resp. rate 18, height 5\' 11"  (1.803 m), weight 75.8 kg, SpO2 100 %.Body mass index is 23.29 kg/m.  General Appearance: Casual  Eye Contact:  Good  Speech:  Clear and Coherent  Volume:  Normal  Mood:  Dysphoric  Affect:  Restricted  Thought Process:  Coherent and Goal Directed  Orientation:  Full (Time, Place, and Person)  Thought Content:  Logical and Tangential  Suicidal Thoughts:  No  Homicidal Thoughts:  No  Memory:  Recent;   Fair  Judgement:  Fair  Insight:  Fair  Psychomotor Activity:  Normal  Concentration:  Concentration: Fair  Recall:  [employer] of Knowledge:  Fair  Language:  Fair  Akathisia:  Negative  Handed:  Right  AIMS (if indicated):     Assets:  Communication Skills Desire for Improvement  ADL's:  Intact  Cognition:  WNL  Sleep:  Number of Hours: 6.75    Treatment Plan Summary: Daily contact with patient to assess and evaluate symptoms and progress in treatment and Medication management  Observation Level/Precautions:  15 minute checks  Laboratory:  UDS  Psychotherapy: Cognitive and reality based  Medications: Multiple adjustments  Consultations: None necessary  Discharge  Concerns: Housing and long-term sobriety  Estimated LOS: 3-5  Other: Axis I depression recurrent severe without psychosis/polysubstance abuse and intoxication   Physician Treatment Plan for Primary Diagnosis: <principal problem not specified> Long Term Goal(s): Improvement in symptoms so as ready for discharge  Short Term Goals: Compliance with prescribed medications will improve  Physician Treatment Plan for Secondary Diagnosis: Active Problems:   MDD (major depressive disorder), severe (HCC)  Long Term Goal(s): Improvement in symptoms so as ready for discharge  Short Term Goals: Ability to identify and develop effective coping behaviors will improve  I certify  that inpatient services furnished can reasonably be expected to improve the patient's condition.    Malvin Johns, MD 5/7/202012:46 PM

## 2019-01-28 NOTE — Progress Notes (Signed)
Patient ID: Tristan [name], male   DOB: Feb 24, 1986, 33 y.o.   MRN: [id number]  Nursing Progress Note [id number]  Patient was pleasant and interactive in the milieu. Patient currently denies SI/HI/AVH.   Patient is educated about and provided medication per provider's orders. Patient safety maintained with q15 min safety checks and low fall risk precautions. Emotional support given, 1:1 interaction, and active listening provided. Patient encouraged to attend meals, groups, and work on treatment plan and goals. Labs, vital signs and patient behavior monitored throughout shift.   Patient contracts for safety with staff. Patient remains safe on the unit at this time and agrees to come to staff with any issues/concerns. Patient is interacting with peers appropriately on the unit. Will continue to support and monitor.

## 2019-01-28 NOTE — [facility] Suicide Risk Assessment (Signed)
[facility] INPATIENT:  Family/Significant Other Suicide Prevention Education  Suicide Prevention Education:  Patient Refusal for Family/Significant Other Suicide Prevention Education: The patient Tristan [name] has refused to provide written consent for family/significant other to be provided Family/Significant Other Suicide Prevention Education during admission and/or prior to discharge.  Physician notified.  Reviewed SPE with patient, will provide SPE pamphlet for patient to share with supports.  Darreld Mclean 01/28/2019, 10:38 AM

## 2019-01-28 NOTE — Progress Notes (Signed)
Patient ID: Tlaloc Ronaldo Pence, male   DOB: 07/23/1986, 33 y.o.   MRN: [id number]    [place] NOVEL CORONAVIRUS (COVID-19) DAILY CHECK-OFF SYMPTOMS - answer yes or no to each - every day NO YES  Have you had a fever in the past 24 hours?  . Fever (Temp > 37.80C / 100F) X   Have you had any of these symptoms in the past 24 hours? . New Cough .  Sore Throat  .  Shortness of Breath .  Difficulty Breathing .  Unexplained Body Aches   X   Have you had any one of these symptoms in the past 24 hours not related to allergies?   . Runny Nose .  Nasal Congestion .  Sneezing   X   If you have had runny nose, nasal congestion, sneezing in the past 24 hours, has it worsened?  X   EXPOSURES - check yes or no X   Have you traveled outside the state in the past 14 days?  X   Have you been in contact with someone with a confirmed diagnosis of COVID-19 or PUI in the past 14 days without wearing appropriate PPE?  X   Have you been living in the same home as a person with confirmed diagnosis of COVID-19 or a PUI (household contact)?    X   Have you been diagnosed with COVID-19?    X              What to do next: Answered NO to all: Answered YES to anything:   Proceed with unit schedule Follow the [facility] Inpatient Flowsheet.   

## 2019-01-28 NOTE — Progress Notes (Signed)
Adult Psychoeducational Group Note  Date:  01/28/2019 Time:  3:18 PM  Group Topic/Focus:  Goals Group:   The focus of this group is to help patients establish daily goals to achieve during treatment and discuss how the patient can incorporate goal setting into their daily lives to aide in recovery.  Participation Level:  Active  Participation Quality:  Appropriate  Affect:  Appropriate  Cognitive:  Alert  Insight: Appropriate  Engagement in Group:  Engaged  Modes of Intervention:  Discussion  Additional Comments:  Pt attended group and participated in discussion.  Danesha Kirchoff R Odessia Asleson 01/28/2019, 3:18 PM

## 2019-01-28 NOTE — [facility] Group Notes (Signed)
[facility] LCSW Group Therapy Note  Date/Time: 01/28/2019, 1:30pm  Type of Therapy/Topic:  Group Therapy:  Feelings about Diagnosis  Participation Level:  Did Not Attend   Mood: N/A   Description of Group:    This group will allow patients to explore their thoughts and feelings about diagnoses they have received. Patients will be guided to explore their level of understanding and acceptance of these diagnoses. Facilitator will encourage patients to process their thoughts and feelings about the reactions of others to their diagnosis, and will guide patients in identifying ways to discuss their diagnosis with significant others in their lives. This group will be process-oriented, with patients participating in exploration of their own experiences as well as giving and receiving support and challenge from other group members.   Therapeutic Goals: 1. Patient will demonstrate understanding of diagnosis as evidence by identifying two or more symptoms of the disorder:  2. Patient will be able to express two feelings regarding the diagnosis 3. Patient will demonstrate ability to communicate their needs through discussion and/or role plays  Summary of Patient Progress:   Pt did not attend group.     Therapeutic Modalities:   Cognitive Behavioral Therapy Brief Therapy Feelings Identification   Kimarie Coor, LCSW 

## 2019-01-28 NOTE — [facility] Suicide Risk Assessment (Signed)
[facility] Admission Suicide Risk Assessment   Nursing information obtained from:  Patient Demographic factors:  Male, Low socioeconomic status, Living alone Current Mental Status:  Self-harm thoughts Loss Factors:  Financial problems / change in socioeconomic status Historical Factors:  Prior suicide attempts, Impulsivity Risk Reduction Factors:  Sense of responsibility to family, Positive social support  Total Time spent with patient: 45 minutes Principal Problem: Homelessness complaints of depression Diagnosis:  Active Problems:   MDD (major depressive disorder), severe (HCC)  Subjective Data: Substance abuse/homelessness recent disorganized behavior  Continued Clinical Symptoms:  Alcohol Use Disorder Identification Test Final Score (AUDIT): 0 The "Alcohol Use Disorders Identification Test", Guidelines for Use in Primary Care, Second Edition.  World Science writer [facility]). Score between 0-7:  no or low risk or alcohol related problems. Score between 8-15:  moderate risk of alcohol related problems. Score between 16-19:  high risk of alcohol related problems. Score 20 or above:  warrants further diagnostic evaluation for alcohol dependence and treatment.   CLINICAL FACTORS:   Depression:   Severe  COGNITIVE FEATURES THAT CONTRIBUTE TO RISK:  None    SUICIDE RISK:   Minimal: No identifiable suicidal ideation.  Patients presenting with no risk factors but with morbid ruminations; may be classified as minimal risk based on the severity of the depressive symptoms  PLAN OF CARE: see eval   I certify that inpatient services furnished can reasonably be expected to improve the patient's condition.   Malvin Johns, MD 01/28/2019, 12:46 PM

## 2019-01-28 NOTE — Progress Notes (Signed)
NUTRITION ASSESSMENT  Pt identified as at risk on the Malnutrition Screen Tool  INTERVENTION: - will change Ensure Enlive to once/day PRN, each supplement provides 350 kcal and 20 grams of protein. - will order Boost Breeze once/day, each supplement provides 250 kcal and 9 grams of protein. - will order daily multivitamin with minerals. - continue to encourage PO intakes.    NUTRITION DIAGNOSIS: Unintentional weight loss related to sub-optimal intake as evidenced by pt report.   Goal: Pt to meet >/= 90% of their estimated nutrition needs.  Monitor:  PO intake  Assessment:  Patient admitted with SI with a plan. Patient had also reported auditory and visual hallucinations PTA. Per chart review, current weight is 167 lb and weight on 01/12/19 was 170 lb. This indicates 3 lb weight loss (2% body weight) in the past 2 weeks; not significant for time frame.  Ensure Enlive was ordered BID per ONS protocol at the time of admission and patient declined the bottle offered to him yesterday.     33 y.o. male  Height: Ht Readings from Last 1 Encounters:  01/27/19 5\' 11"  (1.803 m)    Weight: Wt Readings from Last 1 Encounters:  01/27/19 75.8 kg    Weight Hx: Wt Readings from Last 10 Encounters:  01/27/19 75.8 kg  01/12/19 77.1 kg  05/02/14 79.4 kg  05/01/14 83.9 kg    BMI:  Body mass index is 23.29 kg/m. Pt meets criteria for normal weight based on current BMI.  Estimated Nutritional Needs: Kcal: 25-30 kcal/kg Protein: > 1 gram protein/kg Fluid: 1 ml/kcal  Diet Order:  Diet Order            Diet regular Room service appropriate? Yes; Fluid consistency: Thin  Diet effective now             Pt is also offered choice of unit snacks mid-morning and mid-afternoon.  Pt is eating as desired.   Lab results and medications reviewed.     Trenton Gammon, MS, RD, LDN, [facility] Inpatient Clinical Dietitian Pager # [phone] After hours/weekend pager # [phone]
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
## 2019-01-28 NOTE — Progress Notes (Signed)
D: Pt was in dayroom upon initial approach.  Pt presents with anxious affect and mood.  He smiles upon approach.  His goal is to "continue with my attempt to embrace help."  Pt denies SI/HI, denies hallucinations, denies pain.  Pt has been visible in milieu interacting with peers and staff appropriately.     A: Introduced self to pt.  Met with pt 1:1.  Actively listened to pt and offered support and encouragement.  Medication administered per order.  PRN medication administered for anxiety.  Q15 minute safety checks maintained.  R: Pt is safe on the unit.  Pt is compliant with medications.  Pt verbally contracts for safety.  Will continue to monitor and assess.

## 2019-01-28 NOTE — Progress Notes (Signed)
Adult Psychoeducational Group Note  Date:  01/28/2019 Time:  10:14 PM  Group Topic/Focus:  Wrap-Up Group:   The focus of this group is to help patients review their daily goal of treatment and discuss progress on daily workbooks.  Participation Level:  Active  Participation Quality:  Appropriate  Affect:  Appropriate  Cognitive:  Alert  Insight: Appropriate  Engagement in Group:  Engaged  Modes of Intervention:  Discussion  Additional Comments:  Pt rated his day 7/10. He stated that his goal today was to try and he feels that he accomplished his goal.   Kaleen Odea R 01/28/2019, 10:14 PM

## 2019-01-29 DIAGNOSIS — F1910 Other psychoactive substance abuse, uncomplicated: Secondary | ICD-10-CM

## 2019-01-29 MED ORDER — FLUOXETINE HCL 20 MG PO CAPS: 20.0000 mg | ORAL_CAPSULE | Freq: Every day | ORAL | 2 refills | Status: DC

## 2019-01-29 MED ORDER — GABAPENTIN 300 MG PO CAPS: 300.0000 mg | ORAL_CAPSULE | Freq: Three times a day (TID) | ORAL | 1 refills | Status: DC
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
## 2019-01-29 NOTE — Progress Notes (Signed)
Pt given dc instructions by this nurse. HE states verbal understanding and willingness to comply. He is given cc of dc papers ( SRA, AVS< SSP and transition record). He compltes his daily assessment and on this he wrote he deneid SI today and he rated his depression, hopelessness anda anxeity " 4/7/8", respectively. His belongings are returned to him and he is escorted to bldg entrance per MD order.

## 2019-01-29 NOTE — B[facility] Group Notes (Signed)
B[facility] LCSW Group Therapy Note  Date/Time: 01/29/19, 1315  Type of Therapy and Topic:  Group Therapy:  Feelings around Relapse and Recovery  Participation Level:  Did Not Attend   Mood:  Description of Group:    Patients in this group will discuss emotions they experience before and after a relapse. They will process how experiencing these feelings, or avoidance of experiencing them, relates to having a relapse. Facilitator will guide patients to explore emotions they have related to recovery. Patients will be encouraged to process which emotions are more powerful. They will be guided to discuss the emotional reaction significant others in their lives may have to patients' relapse or recovery. Patients will be assisted in exploring ways to respond to the emotions of others without this contributing to a relapse.  Therapeutic Goals: 1. Patient will identify two or more emotions that lead to relapse for them:  2. Patient will identify two emotions that result when they relapse:  3. Patient will identify two emotions related to recovery:  4. Patient will demonstrate ability to communicate their needs through discussion and/or role plays.   Summary of Patient Progress:     Therapeutic Modalities:   Cognitive Behavioral Therapy Solution-Focused Therapy Assertiveness Training Relapse Prevention Therapy  Greg Lizbett Garciagarcia, LCSW       

## 2019-01-29 NOTE — [facility] Suicide Risk Assessment (Signed)
[facility] Discharge Suicide Risk Assessment   Principal Problem: homeless- substance abuse Discharge Diagnoses: Active Problems:   MDD (major depressive disorder), severe (HCC)   Total Time spent with patient: 45 minutes  Musculoskeletal: Strength & Muscle Tone: within normal limits Gait & Station: normal Patient leans: N/A  Psychiatric Specialty Exam: ROS  Blood pressure (!) 103/50, pulse (!) 55, temperature 97.8 F (36.6 C), temperature source Oral, resp. rate 18, height 5\' 11"  (1.803 m), weight 75.8 kg, SpO2 100 %.Body mass index is 23.29 kg/m.  General Appearance: Casual  Eye Contact::  Good  Speech:  Clear and Coherent409  Volume:  Normal  Mood:  Euthymic  Affect:  Congruent  Thought Process:  Coherent  Orientation:  Full (Time, Place, and Person)  Thought Content:  Logical and Tangential  Suicidal Thoughts:  No  Homicidal Thoughts:  No  Memory:  Immediate;   Good  Judgement:  Fair  Insight:  Good  Psychomotor Activity:  Normal  Concentration:  Good  Recall:  Good  Fund of Knowledge:Good  Language: Good  Akathisia:  Negative  Handed:  Right  AIMS (if indicated):     Assets:  Communication Skills  Sleep:  Number of Hours: 6.5  Cognition: WNL  ADL's:  Intact   Mental Status Per Nursing Assessment::   On Admission:  Self-harm thoughts  Demographic Factors:  Male  Loss Factors: Decrease in vocational status  Historical Factors: Impulsivity  Risk Reduction Factors:   Sense of responsibility to family and Religious beliefs about death  Continued Clinical Symptoms:  Alcohol/Substance Abuse/Dependencies  Cognitive Features That Contribute To Risk:  None    Suicide Risk:  Minimal: No identifiable suicidal ideation.  Patients presenting with no risk factors but with morbid ruminations; may be classified as minimal risk based on the severity of the depressive symptoms  Follow-up Information    Services, [facility] Follow up.   Contact  information: [address] [place] [address] [phone]        [facility] Follow up.   Specialty:  Addiction Medicine Contact information: [address] [place] [address] [phone]           Plan Of Care/Follow-up recommendations:  Activity:  full  Judithann Villamar, MD 01/29/2019, 8:33 AM

## 2019-01-29 NOTE — Progress Notes (Signed)
  [facility] Adult Case Management Discharge Plan :  Will you be returning to the same living situation after discharge:  Yes,  home At discharge, do you have transportation home?: Yes,  bus/a friend Do you have the ability to pay for your medications: Yes; [facility]  Release of information consent forms completed and in the chart;  Patient's signature needed at discharge.  Patient to Follow up at: Follow-up Information    [facility]. Go on 02/02/2019.   Why:  Please wait for a phone call from [facility]. They will call at 9am to set you up for the financial intake and then set you up for an assessment. Contact information: [address] [place] [address] [phone]           Next level of care provider has access to [facility] Link:no  Safety Planning and Suicide Prevention discussed: No.; pt denied. SPE with patient  Have you used any form of tobacco in the last 30 days? (Cigarettes, Smokeless Tobacco, Cigars, and/or Pipes): Yes  Has patient been referred to the Quitline?: Patient refused referral  Patient has been referred for addiction treatment: Pt. refused referral  [name], LCSW 01/29/2019, 9:53 AM
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
## 2019-01-30 ENCOUNTER — Ambulatory Visit (HOSPITAL_COMMUNITY)
Admission: RE | Admit: 2019-01-30 | Discharge: 2019-01-30 | Disposition: A | Discharge status: Home / Self Care | Payer: No Typology Code available for payment source | Source: Home / Self Care | Attending: Psychiatry | Admitting: Psychiatry

## 2019-01-30 NOTE — H&P (Signed)
Behavioral Health Medical Screening Exam  Tristan [name] is an 33 y.o. male. presented to [facility] as a walking requesting to be admitted until a [facility] bed becomes available.  Reports suicidal ideations without plan or intent.  Patient was discharged on 01/29/2019 with follow-up care at [facility].  Patient reported using cocaine this morning. Patient was encouraged to keep follow up appointment. Offered to reprint discharge summary, patient had paperwork in hand. Offered substance abuse resources patient declined. Support, encouragement and reassurances was provided.   Total Time spent with patient: 15 minutes  Psychiatric Specialty Exam: Physical Exam  Vitals reviewed. Constitutional: He appears well-developed.  Psychiatric: He has a normal mood and affect.    Review of Systems  Psychiatric/Behavioral: Positive for depression and substance abuse. The patient is nervous/anxious.   All other systems reviewed and are negative.   Blood pressure (!) 103/50, pulse (!) 55, temperature 97.8 F (36.6 C), temperature source Oral, resp. rate 18, height 5\' 11"  (1.803 m), weight 75.8 kg, SpO2 100 %.Body mass index is 23.29 kg/m.  General Appearance: Casual  Eye Contact:  Fair  Speech:  Clear and Coherent  Volume:  Normal  Mood:  Anxious and Depressed  Affect:  Congruent  Thought Process:  Coherent  Orientation:  Full (Time, Place, and Person)  Thought Content:  Hallucinations: None  Suicidal Thoughts:  No  Homicidal Thoughts:  No  Memory:  Immediate;   Fair Recent;   Fair Remote;   Fair  Judgement:  Fair  Insight:  Fair  Psychomotor Activity:  Normal  Concentration: Concentration: Fair  Recall:  [employer] of Knowledge:Fair  Language: Fair  Akathisia:  No  Handed:  Right  AIMS (if indicated):     Assets:  Communication Skills Desire for Improvement Resilience Social Support  Sleep:  Number of Hours: 6.5    Musculoskeletal: Strength & Muscle Tone: within normal limits Gait &  Station: normal Patient leans: N/A  Blood pressure (!) 103/50, pulse (!) 55, temperature 97.8 F (36.6 C), temperature source Oral, resp. rate 18, height 5\' 11"  (1.803 m), weight 75.8 kg, SpO2 100 %.  Recommendations: - Keep follow-up appointment  Based on my evaluation the patient does not appear to have an emergency medical condition.  Oneta Rack, NP 01/30/2019, 2:24 PM

## 2019-02-16 ENCOUNTER — Other Ambulatory Visit: Payer: Self-pay

## 2019-02-16 ENCOUNTER — Encounter (HOSPITAL_COMMUNITY): Payer: Self-pay | Admitting: Emergency Medicine

## 2019-02-16 ENCOUNTER — Emergency Department (HOSPITAL_COMMUNITY)
Admission: EM | Admit: 2019-02-16 | Discharge: 2019-02-17 | Disposition: A | Discharge status: Home / Self Care | Payer: Self-pay | Attending: Emergency Medicine | Admitting: Emergency Medicine

## 2019-02-16 ENCOUNTER — Emergency Department (HOSPITAL_COMMUNITY): Payer: Self-pay

## 2019-02-16 DIAGNOSIS — Z79899 Other long term (current) drug therapy: Secondary | ICD-10-CM | POA: Insufficient documentation

## 2019-02-16 DIAGNOSIS — R451 Restlessness and agitation: Secondary | ICD-10-CM | POA: Insufficient documentation

## 2019-02-16 DIAGNOSIS — F918 Other conduct disorders: Secondary | ICD-10-CM | POA: Insufficient documentation

## 2019-02-16 DIAGNOSIS — W01198A Fall on same level from slipping, tripping and stumbling with subsequent striking against other object, initial encounter: Secondary | ICD-10-CM | POA: Insufficient documentation

## 2019-02-16 DIAGNOSIS — R079 Chest pain, unspecified: Secondary | ICD-10-CM | POA: Insufficient documentation

## 2019-02-16 DIAGNOSIS — S0990XA Unspecified injury of head, initial encounter: Secondary | ICD-10-CM | POA: Insufficient documentation

## 2019-02-16 DIAGNOSIS — Y929 Unspecified place or not applicable: Secondary | ICD-10-CM | POA: Insufficient documentation

## 2019-02-16 DIAGNOSIS — I10 Essential (primary) hypertension: Secondary | ICD-10-CM | POA: Insufficient documentation

## 2019-02-16 DIAGNOSIS — Y999 Unspecified external cause status: Secondary | ICD-10-CM | POA: Insufficient documentation

## 2019-02-16 DIAGNOSIS — Y9389 Activity, other specified: Secondary | ICD-10-CM | POA: Insufficient documentation

## 2019-02-16 DIAGNOSIS — F17200 Nicotine dependence, unspecified, uncomplicated: Secondary | ICD-10-CM | POA: Insufficient documentation

## 2019-02-16 DIAGNOSIS — R4689 Other symptoms and signs involving appearance and behavior: Secondary | ICD-10-CM

## 2019-02-16 LAB — CBG MONITORING, ED: Glucose-Capillary: 82 mg/dL (ref 70–99)

## 2019-02-16 MED ORDER — SODIUM CHLORIDE 0.9 % IV BOLUS
1000.0000 mL | Freq: Once | INTRAVENOUS | Status: AC
  Administered 2019-02-17: 1000 mL via INTRAVENOUS

## 2019-02-16 NOTE — ED Triage Notes (Signed)
Pt BIB [facility], c/o chest pain after being caught trespassing. EMS reports on their arrival pt became combative, hitting his head against the ground and appeared altered. Pt received 5mg  versed and 5mg  haldol by EMS PTA. Pt in 4-point restraints on arrival to ED, restraints removed and pt resting quietly at this time. EMS VS: BP 138/55, HR 90, SpO2 98% room air.

## 2019-02-16 NOTE — ED Provider Notes (Signed)
[facility] EMERGENCY DEPARTMENT Provider Note  CSN: [id number] Arrival date & time: 02/16/19 2253  Chief Complaint(s) Aggressive Behavior and Chest Pain ED Triage Notes  Jake Bathe, RN (Registered Nurse) . [facility] . . 02/16/2019 10:59 PM . . Signed    Pt BIB [facility], c/o chest pain after being caught trespassing. EMS reports on their arrival pt became combative, hitting his head against the ground and appeared altered. Pt received  versed and  haldol by EMS PTA. Pt in 4-point restraints on arrival to ED, restraints removed and pt resting quietly at this time. EMS VS: BP 138/55, HR 90, SpO2 98% room air.       HPI Tristan [name] is a 33 y.o. male who presents with reported chest pain and aggressive behavior after being caught trespassing. He required chemical sedation.  Remainder of history, ROS, and physical exam limited due to patient's condition (sedation). Additional information was obtained from EMS.   Level V Caveat.    HPI  Past Medical History Past Medical History:  Diagnosis Date  . Bipolar 1 disorder (HCC)   . Hypertension   . PTSD (post-traumatic stress disorder)    Patient Active Problem List   Diagnosis Date Noted  . Polysubstance abuse (HCC) 01/29/2019  . MDD (major depressive disorder), severe (HCC) 01/27/2019  . Suicide attempt (HCC)   . Bipolar 1 disorder, mixed (HCC) 05/02/2014   Home Medication(s) Prior to Admission medications   Medication Sig Start Date End Date Taking? Authorizing Provider  FLUoxetine (PROZAC) 20 MG capsule Take 1 capsule (20 mg total) by mouth daily. 01/30/19   Malvin Johns, MD  gabapentin (NEURONTIN) 300 MG capsule Take 1 capsule (300 mg total) by mouth 3 (three) times daily. 01/29/19   Malvin Johns, MD                                                                                                                                    Past Surgical History Past Surgical History:   Procedure Laterality Date  . NO PAST SURGERIES     Family History Family History  Problem Relation Age of Onset  . Other Father   . Psychiatric Illness Father     Social History Social History   Tobacco Use  . Smoking status: Current Every Day Smoker    Packs/day: 0.50  . Smokeless tobacco: Never Used  Substance Use Topics  . Alcohol use: Never    Frequency: Never  . Drug use: Never    Types: Marijuana, Heroin    Comment: denies drug use   Allergies Tomato  Review of Systems Review of Systems  Unable to perform ROS: Other  sedated  Physical Exam Vital Signs  I have reviewed the triage vital signs BP 97/63 (BP Location: Right Arm)   Pulse 76   Temp 97.6 F (36.4 C) (Axillary)   Resp (!) 22   SpO2 97%  Physical Exam Vitals signs reviewed.  Constitutional:      General: He is not in acute distress.    Appearance: He is well-developed. He is not diaphoretic.  HENT:     Head: Normocephalic. No contusion.      Nose: Nose normal.  Eyes:     General: No scleral icterus.       Right eye: No discharge.        Left eye: No discharge.     Conjunctiva/sclera: Conjunctivae normal.     Pupils: Pupils are equal, round, and reactive to light.  Neck:     Musculoskeletal: Normal range of motion and neck supple.  Cardiovascular:     Rate and Rhythm: Normal rate and regular rhythm.     Heart sounds: No murmur. No friction rub. No gallop.   Pulmonary:     Effort: Pulmonary effort is normal. No respiratory distress.     Breath sounds: Normal breath sounds. No stridor. No rales.  Abdominal:     General: There is no distension.     Palpations: Abdomen is soft.     Tenderness: There is no abdominal tenderness.  Musculoskeletal:        General: No tenderness.  Skin:    General: Skin is warm and dry.     Findings: No erythema or rash.  Neurological:     GCS: GCS eye subscore is 3. GCS verbal subscore is 2. GCS motor subscore is 5.     ED Results and Treatments  Labs (all labs ordered are listed, but only abnormal results are displayed) Labs Reviewed  COMPREHENSIVE METABOLIC PANEL - Abnormal; Notable for the following components:      Result Value   Calcium 8.6 (*)    Total Protein 5.1 (*)    Albumin 3.3 (*)    All other components within normal limits  CBC WITH DIFFERENTIAL/PLATELET - Abnormal; Notable for the following components:   WBC 16.7 (*)    Hemoglobin 12.7 (*)    HCT 36.1 (*)    MCV 76.6 (*)    Neutro Abs 13.2 (*)    Abs Immature Granulocytes 0.08 (*)    All other components within normal limits  ETHANOL  TROPONIN I  RAPID URINE DRUG SCREEN, HOSP PERFORMED  CBG MONITORING, ED                                                                                                                         EKG  EKG Interpretation  Date/Time:  Tuesday Feb 16 2019 23:01:09 EDT Ventricular Rate:  74 PR Interval:    QRS Duration: 90 QT Interval:  402 QTC Calculation: 446 R Axis:   83 Text Interpretation:  Sinus rhythm ST elev, probable normal early repol pattern No significant change since last tracing Confirmed by Drema Pry [id number]) on 02/16/2019 11:19:54 PM      Radiology Ct Head Wo Contrast  Result Date: 02/17/2019 CLINICAL DATA:  Recent fall with head injury, initial  encounter EXAM: CT HEAD WITHOUT CONTRAST TECHNIQUE: Contiguous axial images were obtained from the base of the skull through the vertex without intravenous contrast. COMPARISON:  None. FINDINGS: Brain: No evidence of acute infarction, hemorrhage, hydrocephalus, extra-axial collection or mass lesion/mass effect. Vascular: No hyperdense vessel or unexpected calcification. Skull: Normal. Negative for fracture or focal lesion. Sinuses/Orbits: No acute finding. Other: None. IMPRESSION: Normal head CT for age Electronically Signed   By: Alcide CleverMark  Lukens M.D.   On: 02/17/2019 00:11   Dg Chest Port 1 View  Result Date: 02/16/2019 CLINICAL DATA:  Chest pain EXAM: PORTABLE CHEST 1  VIEW COMPARISON:  May 01, 2014 FINDINGS: No edema or consolidation. Heart is upper normal in size with pulmonary vascularity normal. No adenopathy. No pneumothorax. No bone lesions. IMPRESSION: No edema or consolidation.  Heart upper normal in size. Electronically Signed   By: Bretta BangWilliam  Woodruff III M.D.   On: 02/16/2019 23:41   Pertinent labs & imaging results that were available during my care of the patient were reviewed by me and considered in my medical decision making (see chart for details).  Medications Ordered in ED Medications  sodium chloride 0.9 % bolus 1,000 mL (0 mLs Intravenous Stopped 02/17/19 0115)                                                                                                                                    Procedures Procedures  (including critical care time)  Medical Decision Making / ED Course I have reviewed the nursing notes for this encounter and the patient's prior records (if available in EHR or on provided paperwork).  Clinical Course as of Feb 17 432  Tue Feb 16, 2019  2338 Patient currently sedated from Versed and haldol. Unable to obtain history. Will obtain screening labs, CT head given reported "head banging", EKG and trop. Given the details regarding the circumstance surrounding his presentation, it is likely that his complaint of chest pain is opportunistic in nature.    [PC]  2346 EKG w/o acute ischemic changes or evidence of pericarditis    [PC]  2347 Chest x-ray without evidence suggestive of pneumonia, pneumothorax, pneumomediastinum.  No abnormal contour of the mediastinum to suggest dissection. No evidence of acute injuries.    [PC]  Wed Feb 17, 2019  0131 Ct head negative. Labs with leukocytosis (likely from demargination), otherwise reassuring. Trop negative.    [PC]  0133 Still sedated but response to painful stimuli. Continue monitoring.   [PC]  0336 More awake, AOx3. Denies pain at this time. Requesting food and drink.     [PC]  0433 Ambulated w/o complication   [PC]  [phone] The patient is safe for discharge with strict return precautions.    [PC]    Clinical Course User Index [PC] , Amadeo GarnetPedro Eduardo, MD     Final Clinical Impression(s) / ED Diagnoses Final diagnoses:  Chest pain  Aggressive behavior  Agitation  Disposition: Discharge  Condition: Good  I have discussed the results, Dx and Tx plan with the patient who expressed understanding and agree(s) with the plan. Discharge instructions discussed at great length. The patient was given strict return precautions who verbalized understanding of the instructions. No further questions at time of discharge.    ED Discharge Orders    None       Follow Up: Primary care provider  Schedule an appointment as soon as possible for a visit  As needed      This chart was dictated using voice recognition software.  Despite best efforts to proofread,  errors can occur which can change the documentation meaning.   Nira Conn, MD 02/17/19 [phone]

## 2019-02-17 ENCOUNTER — Other Ambulatory Visit (HOSPITAL_COMMUNITY): Payer: Self-pay

## 2019-02-17 ENCOUNTER — Emergency Department (HOSPITAL_COMMUNITY): Payer: Self-pay

## 2019-02-17 ENCOUNTER — Encounter (HOSPITAL_COMMUNITY): Payer: Self-pay | Admitting: Radiology

## 2019-02-17 LAB — CBC WITH DIFFERENTIAL/PLATELET
Abs Immature Granulocytes: 0.08 10*3/uL — ABNORMAL HIGH (ref 0.00–0.07)
Basophils Absolute: 0.1 10*3/uL (ref 0.0–0.1)
Basophils Relative: 0 %
Eosinophils Absolute: 0 10*3/uL (ref 0.0–0.5)
Eosinophils Relative: 0 %
HCT: 36.1 % — ABNORMAL LOW (ref 39.0–52.0)
Hemoglobin: 12.7 g/dL — ABNORMAL LOW (ref 13.0–17.0)
Immature Granulocytes: 1 %
Lymphocytes Relative: 14 %
Lymphs Abs: 2.4 10*3/uL (ref 0.7–4.0)
MCH: 27 pg (ref 26.0–34.0)
MCHC: 35.2 g/dL (ref 30.0–36.0)
MCV: 76.6 fL — ABNORMAL LOW (ref 80.0–100.0)
Monocytes Absolute: 0.9 10*3/uL (ref 0.1–1.0)
Monocytes Relative: 6 %
Neutro Abs: 13.2 10*3/uL — ABNORMAL HIGH (ref 1.7–7.7)
Neutrophils Relative %: 79 %
Platelets: 281 10*3/uL (ref 150–400)
RBC: 4.71 MIL/uL (ref 4.22–5.81)
RDW: 14.8 % (ref 11.5–15.5)
WBC: 16.7 10*3/uL — ABNORMAL HIGH (ref 4.0–10.5)
nRBC: 0 % (ref 0.0–0.2)

## 2019-02-17 LAB — COMPREHENSIVE METABOLIC PANEL
ALT: 17 U/L (ref 0–44)
AST: 24 U/L (ref 15–41)
Albumin: 3.3 g/dL — ABNORMAL LOW (ref 3.5–5.0)
Alkaline Phosphatase: 39 U/L (ref 38–126)
Anion gap: 10 (ref 5–15)
BUN: 14 mg/dL (ref 6–20)
CO2: 22 mmol/L (ref 22–32)
Calcium: 8.6 mg/dL — ABNORMAL LOW (ref 8.9–10.3)
Chloride: 106 mmol/L (ref 98–111)
Creatinine, Ser: 1.15 mg/dL (ref 0.61–1.24)
GFR calc Af Amer: >60 mL/min (ref >60)
GFR calc non Af Amer: >60 mL/min (ref >60)
Glucose, Bld: 83 mg/dL (ref 70–99)
Potassium: 3.9 mmol/L (ref 3.5–5.1)
Sodium: 138 mmol/L (ref 135–145)
Total Bilirubin: 1 mg/dL (ref 0.3–1.2)
Total Protein: 5.1 g/dL — ABNORMAL LOW (ref 6.5–8.1)

## 2019-02-17 LAB — ETHANOL: Alcohol, Ethyl (B): <10 mg/dL (ref <10)

## 2019-02-17 LAB — TROPONIN I: Troponin I: <0.03 ng/mL (ref <0.03)

## 2019-02-17 NOTE — ED Notes (Signed)
   Patient ambulated down the hallway with no assistance.

## 2019-02-17 NOTE — ED Notes (Signed)
Gave pt sandwich and water 

## 2019-02-19 ENCOUNTER — Emergency Department (HOSPITAL_COMMUNITY)
Admission: EM | Admit: 2019-02-19 | Discharge: 2019-02-20 | Disposition: A | Discharge status: Other Acute Inpatient Hospital | Payer: Self-pay | Attending: Emergency Medicine | Admitting: Emergency Medicine

## 2019-02-19 DIAGNOSIS — F17210 Nicotine dependence, cigarettes, uncomplicated: Secondary | ICD-10-CM | POA: Insufficient documentation

## 2019-02-19 DIAGNOSIS — R456 Violent behavior: Secondary | ICD-10-CM

## 2019-02-19 DIAGNOSIS — F1910 Other psychoactive substance abuse, uncomplicated: Secondary | ICD-10-CM

## 2019-02-19 DIAGNOSIS — I10 Essential (primary) hypertension: Secondary | ICD-10-CM | POA: Insufficient documentation

## 2019-02-19 DIAGNOSIS — R45851 Suicidal ideations: Secondary | ICD-10-CM

## 2019-02-19 DIAGNOSIS — Z1159 Encounter for screening for other viral diseases: Secondary | ICD-10-CM | POA: Insufficient documentation

## 2019-02-19 LAB — CBC WITH DIFFERENTIAL/PLATELET
Abs Immature Granulocytes: 0.02 10*3/uL (ref 0.00–0.07)
Basophils Absolute: 0.1 10*3/uL (ref 0.0–0.1)
Basophils Relative: 1 %
Eosinophils Absolute: 0.1 10*3/uL (ref 0.0–0.5)
Eosinophils Relative: 1 %
HCT: 36.8 % — ABNORMAL LOW (ref 39.0–52.0)
Hemoglobin: 13.4 g/dL (ref 13.0–17.0)
Immature Granulocytes: 0 %
Lymphocytes Relative: 22 %
Lymphs Abs: 2 10*3/uL (ref 0.7–4.0)
MCH: 28.3 pg (ref 26.0–34.0)
MCHC: 36.4 g/dL — ABNORMAL HIGH (ref 30.0–36.0)
MCV: 77.8 fL — ABNORMAL LOW (ref 80.0–100.0)
Monocytes Absolute: 0.5 10*3/uL (ref 0.1–1.0)
Monocytes Relative: 5 %
Neutro Abs: 6.8 10*3/uL (ref 1.7–7.7)
Neutrophils Relative %: 71 %
Platelets: 303 10*3/uL (ref 150–400)
RBC: 4.73 MIL/uL (ref 4.22–5.81)
RDW: 15.2 % (ref 11.5–15.5)
WBC: 9.3 10*3/uL (ref 4.0–10.5)
nRBC: 0 % (ref 0.0–0.2)

## 2019-02-19 LAB — BASIC METABOLIC PANEL
Anion gap: 10 (ref 5–15)
BUN: 17 mg/dL (ref 6–20)
CO2: 22 mmol/L (ref 22–32)
Calcium: 8.8 mg/dL — ABNORMAL LOW (ref 8.9–10.3)
Chloride: 105 mmol/L (ref 98–111)
Creatinine, Ser: 1.2 mg/dL (ref 0.61–1.24)
GFR calc Af Amer: >60 mL/min (ref >60)
GFR calc non Af Amer: >60 mL/min (ref >60)
Glucose, Bld: 115 mg/dL — ABNORMAL HIGH (ref 70–99)
Potassium: 3.6 mmol/L (ref 3.5–5.1)
Sodium: 137 mmol/L (ref 135–145)

## 2019-02-19 LAB — ACETAMINOPHEN LEVEL: Acetaminophen (Tylenol), Serum: <10 ug/mL — ABNORMAL LOW (ref 10–30)

## 2019-02-19 LAB — ETHANOL: Alcohol, Ethyl (B): <10 mg/dL (ref <10)

## 2019-02-19 LAB — SALICYLATE LEVEL: Salicylate Lvl: <7 mg/dL (ref 2.8–30.0)

## 2019-02-19 MED ORDER — ZIPRASIDONE MESYLATE 20 MG IM SOLR
INTRAMUSCULAR | Status: AC
  Administered 2019-02-19: 20 mg via INTRAMUSCULAR
  Filled 2019-02-19: qty 20

## 2019-02-19 MED ORDER — ZIPRASIDONE MESYLATE 20 MG IM SOLR
INTRAMUSCULAR | Status: AC
  Filled 2019-02-19: qty 20

## 2019-02-19 MED ORDER — STERILE WATER FOR INJECTION IJ SOLN
INTRAMUSCULAR | Status: AC
  Administered 2019-02-19: 23:00:00
  Filled 2019-02-19: qty 10

## 2019-02-19 NOTE — ED Notes (Signed)
Bed: WTR5 Expected date:  Expected time:  Means of arrival:  Comments: 

## 2019-02-19 NOTE — ED Provider Notes (Signed)
[facility]-EMERGENCY DEPT Provider Note: Tristan [name] Maxine Fredman, MD, FACEP  CSN: [id number] MRN: [id number] ARRIVAL: 02/19/19 at 2235 ROOM: WA15/WA15   CHIEF COMPLAINT  Medical Clearance and Paranoid  Level 5 caveat: Psychosis; uncooperative HISTORY OF PRESENT ILLNESS  02/19/19 11:41 PM Tristan [name] is a 33 y.o. male with a history of bipolar disorder.  He was found in the parking lot of this emergency department expressing a desire to die.  He told police he planned to climb up on their car so they would have an excuse to shoot him.  He twice to asked one of the officers to shoot him.  He subsequently became violent with police and ED staff requiring emergency sedation with Geodon and four-point restraints.   Past Medical History:  Diagnosis Date  . Bipolar 1 disorder (HCC)   . Hypertension   . PTSD (post-traumatic stress disorder)     Past Surgical History:  Procedure Laterality Date  . NO PAST SURGERIES      Family History  Problem Relation Age of Onset  . Other Father   . Psychiatric Illness Father     Social History   Tobacco Use  . Smoking status: Current Every Day Smoker    Packs/day: 0.50  . Smokeless tobacco: Never Used  Substance Use Topics  . Alcohol use: Never    Frequency: Never  . Drug use: Never    Types: Marijuana, Heroin    Comment: denies drug use    Prior to Admission medications   Medication Sig Start Date End Date Taking? Authorizing Provider  FLUoxetine (PROZAC) 20 MG capsule Take 1 capsule (20 mg total) by mouth daily. Patient not taking: Reported on 02/19/2019 01/30/19   Malvin JohnsFarah, Brian, MD  gabapentin (NEURONTIN) 300 MG capsule Take 1 capsule (300 mg total) by mouth 3 (three) times daily. Patient not taking: Reported on 02/19/2019 01/29/19   Malvin JohnsFarah, Brian, MD    Allergies Tomato   REVIEW OF SYSTEMS    PHYSICAL EXAMINATION  Initial Vital Signs Blood pressure 120/67, pulse 92, temperature 99.4 F (37.4 C), temperature source Oral, resp.  rate 18, SpO2 98 %.  Examination General: Well-developed, well-nourished male in no acute distress; appearance consistent with age of record HENT: normocephalic; atraumatic Eyes: pupils equal, round and reactive to light Neck: supple Heart: regular rate and rhythm Lungs: clear to auscultation bilaterally Abdomen: soft; nondistended; nontender; no masses or hepatosplenomegaly; bowel sounds present Extremities: No deformity; full range of motion; pulses normal Neurologic: Awake, alert; motor function intact in all extremities and symmetric; no facial droop Skin: Warm and dry Psychiatric: Combative; uncooperative; suicidal ideation   RESULTS  Summary of this visit's results, reviewed by myself:   EKG Interpretation  Date/Time:  Friday Feb 19 2019 23:06:50 EDT Ventricular Rate:  93 PR Interval:    QRS Duration: 94 QT Interval:  359 QTC Calculation: 447 R Axis:   85 Text Interpretation:  Sinus rhythm Borderline ST elevation, anterior leads No significant change was found Confirmed by Paula LibraMolpus, Ved Martos ([id number]) on 02/19/2019 11:47:19 PM      Laboratory Studies: Results for orders placed or performed during the hospital encounter of 02/19/19 (from the past 24 hour(s))  CBC with Differential/Platelet     Status: Abnormal   Collection Time: 02/19/19 11:03 PM  Result Value Ref Range   WBC 9.3 4.0 - 10.5 K/uL   RBC 4.73 4.22 - 5.81 MIL/uL   Hemoglobin 13.4 13.0 - 17.0 g/dL   HCT 84.636.8 (L) 96.239.0 - 95.252.0 %  MCV 77.8 (L) 80.0 - 100.0 fL   MCH 28.3 26.0 - 34.0 pg   MCHC 36.4 (H) 30.0 - 36.0 g/dL   RDW 56.4 33.2 - 95.1 %   Platelets 303 150 - 400 K/uL   nRBC 0.0 0.0 - 0.2 %   Neutrophils Relative % 71 %   Neutro Abs 6.8 1.7 - 7.7 K/uL   Lymphocytes Relative 22 %   Lymphs Abs 2.0 0.7 - 4.0 K/uL   Monocytes Relative 5 %   Monocytes Absolute 0.5 0.1 - 1.0 K/uL   Eosinophils Relative 1 %   Eosinophils Absolute 0.1 0.0 - 0.5 K/uL   Basophils Relative 1 %   Basophils Absolute 0.1 0.0 - 0.1  K/uL   Immature Granulocytes 0 %   Abs Immature Granulocytes 0.02 0.00 - 0.07 K/uL  Basic metabolic panel     Status: Abnormal   Collection Time: 02/19/19 11:03 PM  Result Value Ref Range   Sodium 137 135 - 145 mmol/L   Potassium 3.6 3.5 - 5.1 mmol/L   Chloride 105 98 - 111 mmol/L   CO2 22 22 - 32 mmol/L   Glucose, Bld 115 (H) 70 - 99 mg/dL   BUN 17 6 - 20 mg/dL   Creatinine, Ser 8.84 0.61 - 1.24 mg/dL   Calcium 8.8 (L) 8.9 - 10.3 mg/dL   GFR calc non Af Amer >60 >60 mL/min   GFR calc Af Amer >60 >60 mL/min   Anion gap 10 5 - 15  Salicylate level     Status: None   Collection Time: 02/19/19 11:03 PM  Result Value Ref Range   Salicylate Lvl <7.0 2.8 - 30.0 mg/dL  Acetaminophen level     Status: Abnormal   Collection Time: 02/19/19 11:03 PM  Result Value Ref Range   Acetaminophen (Tylenol), Serum <10 (L) 10 - 30 ug/mL  Ethanol     Status: None   Collection Time: 02/19/19 11:03 PM  Result Value Ref Range   Alcohol, Ethyl (B) <10 <10 mg/dL  Rapid urine drug screen (hospital performed)     Status: Abnormal   Collection Time: 02/20/19  4:32 AM  Result Value Ref Range   Opiates NONE DETECTED NONE DETECTED   Cocaine POSITIVE (A) NONE DETECTED   Benzodiazepines NONE DETECTED NONE DETECTED   Amphetamines POSITIVE (A) NONE DETECTED   Tetrahydrocannabinol POSITIVE (A) NONE DETECTED   Barbiturates NONE DETECTED NONE DETECTED   Imaging Studies: No results found.  ED COURSE and MDM  Nursing notes and initial vitals signs, including pulse oximetry, reviewed.  Vitals:   02/20/19 0400 02/20/19 0500 02/20/19 0530 02/20/19 0600  BP: 119/65 113/70 111/63 113/65  Pulse: 93 63 69 66  Resp: 19 20 18  (!) 28  Temp:      TempSrc:      SpO2: 99% 100% 98% 98%   11:45 PM Patient resting comfortably after Geodon.  Involuntary commitment papers filed.   6:24 AM Assessed by TTS and accepted for inpatient treatment.  PROCEDURES    ED DIAGNOSES     ICD-10-CM   1. Suicidal ideation  R45.851   2. Violent behavior R45.6   3. Polysubstance abuse (HCC) F19.10        Farris Blash, Jonny Ruiz, MD 02/20/19 [phone]

## 2019-02-19 NOTE — ED Notes (Signed)
Bed: YY48 Expected date:  Expected time:  Means of arrival:  Comments: [facility] Levins

## 2019-02-20 ENCOUNTER — Encounter (HOSPITAL_COMMUNITY): Payer: Self-pay | Admitting: Registered Nurse

## 2019-02-20 ENCOUNTER — Other Ambulatory Visit: Payer: Self-pay

## 2019-02-20 ENCOUNTER — Inpatient Hospital Stay (HOSPITAL_COMMUNITY)
Admission: AD | Admit: 2019-02-20 | Discharge: 2019-02-23 | DRG: 885 | Disposition: A | Discharge status: Home / Self Care | Payer: No Typology Code available for payment source | Attending: Psychiatry | Admitting: Psychiatry

## 2019-02-20 ENCOUNTER — Encounter (HOSPITAL_COMMUNITY): Payer: Self-pay | Admitting: Emergency Medicine

## 2019-02-20 DIAGNOSIS — F1010 Alcohol abuse, uncomplicated: Secondary | ICD-10-CM | POA: Diagnosis present

## 2019-02-20 DIAGNOSIS — Z590 Homelessness: Secondary | ICD-10-CM

## 2019-02-20 DIAGNOSIS — R45851 Suicidal ideations: Secondary | ICD-10-CM | POA: Diagnosis present

## 2019-02-20 DIAGNOSIS — F319 Bipolar disorder, unspecified: Secondary | ICD-10-CM | POA: Diagnosis present

## 2019-02-20 DIAGNOSIS — Z915 Personal history of self-harm: Secondary | ICD-10-CM | POA: Diagnosis not present

## 2019-02-20 DIAGNOSIS — F17200 Nicotine dependence, unspecified, uncomplicated: Secondary | ICD-10-CM | POA: Diagnosis present

## 2019-02-20 DIAGNOSIS — F1910 Other psychoactive substance abuse, uncomplicated: Secondary | ICD-10-CM | POA: Diagnosis present

## 2019-02-20 DIAGNOSIS — F332 Major depressive disorder, recurrent severe without psychotic features: Secondary | ICD-10-CM | POA: Diagnosis present

## 2019-02-20 DIAGNOSIS — F1424 Cocaine dependence with cocaine-induced mood disorder: Secondary | ICD-10-CM | POA: Diagnosis not present

## 2019-02-20 DIAGNOSIS — Z1159 Encounter for screening for other viral diseases: Secondary | ICD-10-CM | POA: Diagnosis not present

## 2019-02-20 LAB — RAPID URINE DRUG SCREEN, HOSP PERFORMED
Amphetamines: POSITIVE — AB
Barbiturates: NOT DETECTED
Benzodiazepines: NOT DETECTED
Cocaine: POSITIVE — AB
Opiates: NOT DETECTED
Tetrahydrocannabinol: POSITIVE — AB

## 2019-02-20 LAB — SARS CORONAVIRUS 2 BY RT PCR (HOSPITAL ORDER, PERFORMED IN ~~LOC~~ HOSPITAL LAB): SARS Coronavirus 2: NEGATIVE

## 2019-02-20 MED ORDER — TRAZODONE HCL 100 MG PO TABS
100.0000 mg | ORAL_TABLET | Freq: Every evening | ORAL | Status: DC | PRN
  Administered 2019-02-20: 100 mg via ORAL
  Filled 2019-02-20 (×7): qty 1

## 2019-02-20 MED ORDER — NICOTINE 21 MG/24HR TD PT24
21.0000 mg | MEDICATED_PATCH | Freq: Every day | TRANSDERMAL | Status: DC
  Administered 2019-02-20: 21 mg via TRANSDERMAL
  Filled 2019-02-20: qty 1

## 2019-02-20 MED ORDER — ALUM & MAG HYDROXIDE-SIMETH 200-200-20 MG/5ML PO SUSP: 30.0000 mL | Freq: Four times a day (QID) | ORAL | Status: DC | PRN

## 2019-02-20 MED ORDER — ENSURE ENLIVE PO LIQD
237.0000 mL | Freq: Two times a day (BID) | ORAL | Status: DC
  Administered 2019-02-21 – 2019-02-23 (×4): 237 mL via ORAL

## 2019-02-20 MED ORDER — FLUOXETINE HCL 20 MG PO CAPS
20.0000 mg | ORAL_CAPSULE | Freq: Every day | ORAL | Status: DC
  Administered 2019-02-20 – 2019-02-21 (×2): 20 mg via ORAL
  Filled 2019-02-20 (×5): qty 1

## 2019-02-20 MED ORDER — HYDROXYZINE HCL 25 MG PO TABS
25.0000 mg | ORAL_TABLET | Freq: Four times a day (QID) | ORAL | Status: DC | PRN
  Administered 2019-02-20 – 2019-02-22 (×3): 25 mg via ORAL
  Filled 2019-02-20: qty 10
  Filled 2019-02-20 (×3): qty 1

## 2019-02-20 MED ORDER — ONDANSETRON HCL 4 MG PO TABS: 4.0000 mg | ORAL_TABLET | Freq: Three times a day (TID) | ORAL | Status: DC | PRN

## 2019-02-20 MED ORDER — PNEUMOCOCCAL VAC POLYVALENT 25 MCG/0.5ML IJ INJ: 0.5000 mL | INJECTION | INTRAMUSCULAR | Status: DC

## 2019-02-20 MED ORDER — GABAPENTIN 300 MG PO CAPS
300.0000 mg | ORAL_CAPSULE | Freq: Three times a day (TID) | ORAL | Status: DC
  Administered 2019-02-20 – 2019-02-21 (×4): 300 mg via ORAL
  Filled 2019-02-20 (×9): qty 1

## 2019-02-20 MED ORDER — ACETAMINOPHEN 325 MG PO TABS: 650.0000 mg | ORAL_TABLET | ORAL | Status: DC | PRN

## 2019-02-20 NOTE — ED Notes (Signed)
In pt - assignment to be determined later per TTS [facility]

## 2019-02-20 NOTE — ED Notes (Signed)
Pt resting in bed. Pt guarded, cooperative. Slow to respond, quiet. Pt asked to leave, pt was redirected that he needed to stay to work out course of treatment.

## 2019-02-20 NOTE — Progress Notes (Signed)
[facility] Group Notes:  (Nursing/MHT/Case Management/Adjunct)  Date:  02/20/2019  Time:  2030  Type of Therapy:  wrap up group  Participation Level:  Active  Participation Quality:  Appropriate, Attentive, Sharing and Supportive  Affect:  Appropriate  Cognitive:  Appropriate  Insight:  Improving  Engagement in Group:  Engaged  Modes of Intervention:  Clarification, Education and Support  Summary of Progress/Problems: Pt shares that he is more optimistic about his situation. Pt states one thing he is going to do differently is face his fears head on. Pt is grateful for his supports.   Marcille Buffy 02/20/2019, 10:46 PM

## 2019-02-20 NOTE — ED Notes (Signed)
Police called for transport. 
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
## 2019-02-20 NOTE — Progress Notes (Signed)
Tristan Sievert, PA recommends inpt tx. [facility] to review for possible admission. EDP Molpus, John, MD and pt's nurse Jonny Ruiz, RN have been advised.  Princess Bruins, MSW, LCSW Therapeutic Triage Specialist  [phone]

## 2019-02-20 NOTE — Progress Notes (Signed)
Patient ID: Tristan [name], male   DOB: May 17, 1986, 33 y.o.   MRN: [id number]  D: Pt alert and oriented during [facility] admission process. Pt denies SI/HI, A/VH, and any pain. Pt is cooperative.  "Tristan [name] is an 33 y.o. male who presents to the ED under IVC initiated by EDP. Pt reportedly asked police officers to shoot him and planned to jump on top of patrol cars and force police officers to shoot him. Pt states he is suicidal because he feels helpless. Pt states he is homeless, has no income, no supports, and feels hopeless. Pt is vague during the assessment and slow to respond. Pt reports he experiences VH including "seeing evil people and bad stuff." Pt has been admitted to [facility] due to similar concerns, most recently admitted to [facility] on 01/27/19. Pt is unable to contract for safety at this time."  A: Education, support, reassurance, and encouragement provided, q15 minute safety checks initiated. Pt's belongings in locker # 34.    R: Pt denies any concerns at this time, and verbally contracts for safety. Pt ambulating on the unit with no issues. Pt remains safe on and off the unit.

## 2019-02-20 NOTE — ED Notes (Signed)
Bed: ZO10 Expected date:  Expected time:  Means of arrival:  Comments: Room 15

## 2019-02-20 NOTE — Tx Team (Signed)
Initial Treatment Plan 02/20/2019 6:11 PM [name] KGM:[id number]    PATIENT STRESSORS: Financial difficulties Occupational concerns Substance abuse   PATIENT STRENGTHS: Ability for insight Communication skills Supportive family/friends   PATIENT IDENTIFIED PROBLEMS: Substance Abuse  "Anxiety"  "Panic Attacks"  "Depression"               DISCHARGE CRITERIA:  Ability to meet basic life and health needs Improved stabilization in mood, thinking, and/or behavior Reduction of life-threatening or endangering symptoms to within safe limits Verbal commitment to aftercare and medication compliance Withdrawal symptoms are absent or subacute and managed without 24-hour nursing intervention  PRELIMINARY DISCHARGE PLAN: Outpatient therapy Return to previous living arrangement  PATIENT/FAMILY INVOLVEMENT: This treatment plan has been presented to and reviewed with the patient, Tristan [name], and/or family member.  The patient and family have been given the opportunity to ask questions and make suggestions.  Tania Ade, RN 02/20/2019, 6:11 PM

## 2019-02-20 NOTE — ED Notes (Signed)
Report called to [facility], Ethelene Browns RN

## 2019-02-21 DIAGNOSIS — F1424 Cocaine dependence with cocaine-induced mood disorder: Secondary | ICD-10-CM

## 2019-02-21 MED ORDER — QUETIAPINE FUMARATE 50 MG PO TABS
50.0000 mg | ORAL_TABLET | Freq: Every day | ORAL | Status: DC
  Administered 2019-02-21 – 2019-02-22 (×2): 50 mg via ORAL
  Filled 2019-02-21: qty 1
  Filled 2019-02-21: qty 7
  Filled 2019-02-21 (×3): qty 1

## 2019-02-21 MED ORDER — ADULT MULTIVITAMIN W/MINERALS CH
1.0000 | ORAL_TABLET | Freq: Every day | ORAL | Status: DC
  Administered 2019-02-22 – 2019-02-23 (×2): 1 via ORAL
  Filled 2019-02-21 (×4): qty 1

## 2019-02-21 MED ORDER — THIAMINE HCL 100 MG/ML IJ SOLN: 100.0000 mg | Freq: Once | INTRAMUSCULAR | Status: DC

## 2019-02-21 MED ORDER — VITAMIN B-1 100 MG PO TABS
100.0000 mg | ORAL_TABLET | Freq: Every day | ORAL | Status: DC
  Administered 2019-02-22 – 2019-02-23 (×2): 100 mg via ORAL
  Filled 2019-02-21 (×4): qty 1

## 2019-02-21 MED ORDER — ADULT MULTIVITAMIN W/MINERALS CH
1.0000 | ORAL_TABLET | Freq: Every day | ORAL | Status: DC
  Administered 2019-02-21: 1 via ORAL
  Filled 2019-02-21 (×4): qty 1

## 2019-02-21 MED ORDER — FLUOXETINE HCL 10 MG PO CAPS
10.0000 mg | ORAL_CAPSULE | Freq: Every day | ORAL | Status: DC
  Administered 2019-02-22 – 2019-02-23 (×2): 10 mg via ORAL
  Filled 2019-02-21 (×3): qty 1
  Filled 2019-02-21: qty 7

## 2019-02-21 MED ORDER — LORAZEPAM 1 MG PO TABS: 1.0000 mg | ORAL_TABLET | Freq: Four times a day (QID) | ORAL | Status: DC | PRN
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
## 2019-02-21 NOTE — Progress Notes (Signed)
Patient ID: Sunay Chock, male   DOB: 1986-02-17, 33 y.o.   MRN: [id number]    [place] NOVEL CORONAVIRUS (COVID-19) DAILY CHECK-OFF SYMPTOMS - answer yes or no to each - every day NO YES  Have you had a fever in the past 24 hours?  . Fever (Temp > 37.80C / 100F) X   Have you had any of these symptoms in the past 24 hours? . New Cough .  Sore Throat  .  Shortness of Breath .  Difficulty Breathing .  Unexplained Body Aches   X   Have you had any one of these symptoms in the past 24 hours not related to allergies?   . Runny Nose .  Nasal Congestion .  Sneezing   X   If you have had runny nose, nasal congestion, sneezing in the past 24 hours, has it worsened?  X   EXPOSURES - check yes or no X   Have you traveled outside the state in the past 14 days?  X   Have you been in contact with someone with a confirmed diagnosis of COVID-19 or PUI in the past 14 days without wearing appropriate PPE?  X   Have you been living in the same home as a person with confirmed diagnosis of COVID-19 or a PUI (household contact)?    X   Have you been diagnosed with COVID-19?    X              What to do next: Answered NO to all: Answered YES to anything:   Proceed with unit schedule Follow the [facility] Inpatient Flowsheet.
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
## 2019-02-21 NOTE — Progress Notes (Signed)
NUTRITION ASSESSMENT RD working remotely.   Pt identified as at risk on the Malnutrition Screen Tool  INTERVENTION: - continue Ensure Enlive po BID, each supplement provides 350 kcal and 20 grams of protein - will order daily multivitamin with minerals. - continue to encourage PO intakes.   NUTRITION DIAGNOSIS: Unintentional weight loss related to sub-optimal intake as evidenced by pt report.   Goal: Pt to meet >/= 90% of their estimated nutrition needs.  Monitor:  PO intake  Assessment:  Patient IVC'ed for SI and visual hallucinations ("evil people and bad stuff"). Patient has been at [facility] in the past, most recently at the beginning of this month. Patient reported that he is homeless and has no source of income and no support system.   Per chart review, current weight is 166 lb and weight on 4/21 was 170 lb. This indicates 4 lb weight loss (2.3% body weight) in the past 5 weeks; not significant for time frame.    33 y.o. male  Height: Ht Readings from Last 1 Encounters:  02/20/19 5\' 11"  (1.803 m)    Weight: Wt Readings from Last 1 Encounters:  02/20/19 75.7 kg    Weight Hx: Wt Readings from Last 10 Encounters:  02/20/19 75.7 kg  01/27/19 75.8 kg  01/12/19 77.1 kg  05/02/14 79.4 kg  05/01/14 83.9 kg    BMI:  Body mass index is 23.28 kg/m. Pt meets criteria for normal weight based on current BMI.  Estimated Nutritional Needs: Kcal: 25-30 kcal/kg Protein: > 1 gram protein/kg Fluid: 1 ml/kcal  Diet Order:  Diet Order            Diet regular Room service appropriate? Yes; Fluid consistency: Thin  Diet effective now             Pt is also offered choice of unit snacks mid-morning and mid-afternoon.  Pt is eating as desired.   Lab results and medications reviewed.     Trenton Gammon, MS, RD, LDN, [facility] Inpatient Clinical Dietitian Pager # [phone] After hours/weekend pager # [phone]

## 2019-02-21 NOTE — [facility] Group Notes (Signed)
[facility] LCSW Group Therapy Note  02/21/2019    Type of Therapy and Topic:  Group Therapy:  Adding Supports Including Yourself  Participation Level:  Active   Description of Group:  Patients in this group were introduced to the concept that additional supports including self-support are an essential part of recovery.  Patients listed what supports they believe they need to add to their lives to achieve their goals at discharge, and they listed such things as therapist, family, doctor, support groups, and service dog.   A song entitled "My Own Hero" was played and a group discussion ensued in which patients stated they could relate to the song and it inspired them to realize they have be willing to help themselves in order to succeed, because other people cannot achieve sobriety or stability for them.  "Fight Megan Salon" was played, then "Fight For It" to encourage patients.  They discussed the impact on them and how they must remain convinced that their lives are worth the effort it takes to become sober and/or stable.  Therapeutic Goals: 1)  demonstrate the importance of being a key part of one's own support system 2)  discuss various available supports 3)  encourage patient to use music as part of their self-support and focus on goals 4)  elicit ideas from patients about supports that need to be added   Summary of Patient Progress:  The patient expressed himself several times during group and seemed interested in the topic and in what other patients had to say.  He was alert and attentive throughout.   Therapeutic Modalities:   Motivational Interviewing Activity  Lynnell Chad  12:31 PM
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
## 2019-02-21 NOTE — Progress Notes (Signed)
Psychoeducational Group Note  Date:  02/21/2019 Time:  1300  Group Topic/Focus:  Identifying Needs:   The focus of this group is to help patients identify their personal needs that have been historically problematic and identify healthy behaviors to address their needs.  Participation Level: Good, attentive, moderate participation   Participation Quality: Good  Affect: Animated   Cognitive:  WDL  Insight:  Moderate, slight paranoia   Engagement in Group: Good  Additional Comments:  None  Tristan [name] 02/21/2019, 1:13 PM
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
## 2019-02-22 LAB — HEMOGLOBIN A1C
Hgb A1c MFr Bld: 5 % (ref 4.8–5.6)
Mean Plasma Glucose: 96.8 mg/dL

## 2019-02-22 LAB — LIPID PANEL
Cholesterol: 139 mg/dL (ref 0–200)
HDL: 66 mg/dL (ref >40)
LDL Cholesterol: 66 mg/dL (ref 0–99)
Total CHOL/HDL Ratio: 2.1 RATIO
Triglycerides: 37 mg/dL (ref <150)
VLDL: 7 mg/dL (ref 0–40)

## 2019-02-22 LAB — TSH: TSH: 2.225 u[IU]/mL (ref 0.350–4.500)

## 2019-02-22 NOTE — Progress Notes (Signed)
DAR NOTE: Patient presents with calm affect and pleasant mood.  Denies suicidal thoughts, pain, auditory and visual hallucinations.  Described energy level as normal and concentration as good.  Rates depression at 0, hopelessness at 0, and anxiety at 3.  Maintained on routine safety checks.  Medications given as prescribed.  Support and encouragement offered as needed.  Attended group and participated.  States goal for today is "getting back into society and becoming a model citizen."  Patient visible in milieu with minimal interactions.  Patient safe on and off the unit.

## 2019-02-22 NOTE — Progress Notes (Addendum)
[facility] MD Progress Note  02/22/2019 10:44 AM Tristan [name]  MRN:  [id number]   Subjective:  Patient reports that he is feeling better today. He denies any suicidal or homicidal ideations and hallucinations. He denies any withdrawal symptoms. He states that he has slept better and his appetite has improved. He states that he wants to leave the hospital. He reports that he plans to go to [facility] residential, but wants to do it on his own. He would like someone to help with an appointment, but doesn't want to stay here till its time to go to [facility].  After my interview his father had contacted the CSW and stated that the patient had said he was going to kill himself when he left the hospital. The patient was asked about this and the patient denied it and stated he no longer feels that way. He states that someone he knows is trying to sabotage him leaving and wants him in the hospital because he keeps using drugs.   Objective: Patient's chart and findings reviewed and discussed with treatment team. Patient presents in his room and is pleasant, calm, and cooperative. He was not in group this morning, but also did not realized he slept till 1030. There are concerns for the report of him killing himself after he leaves, but the patient stated that he lives with his mother and not his father.   Principal Problem: MDD (major depressive disorder), recurrent episode, severe (HCC) Diagnosis: Principal Problem:   MDD (major depressive disorder), recurrent episode, severe (HCC) Active Problems:   Polysubstance abuse (HCC)  Total Time spent with patient: 20 minutes  Past Psychiatric History: See H&P  Past Medical History:  Past Medical History:  Diagnosis Date  . Bipolar 1 disorder (HCC)   . Hypertension   . PTSD (post-traumatic stress disorder)     Past Surgical History:  Procedure Laterality Date  . NO PAST SURGERIES     Family History:  Family History  Problem Relation Age of Onset  .  Other Father   . Psychiatric Illness Father    Family Psychiatric  History: See H&P Social History:  Social History   Substance and Sexual Activity  Alcohol Use Never  . Frequency: Never     Social History   Substance and Sexual Activity  Drug Use Never  . Types: Marijuana, Heroin   Comment: denies drug use    Social History   Socioeconomic History  . Marital status: Single    Spouse name: Not on file  . Number of children: Not on file  . Years of education: Not on file  . Highest education level: Not on file  Occupational History  . Not on file  Social Needs  . Financial resource strain: Not on file  . Food insecurity:    Worry: Not on file    Inability: Not on file  . Transportation needs:    Medical: Not on file    Non-medical: Not on file  Tobacco Use  . Smoking status: Current Every Day Smoker    Packs/day: 0.50  . Smokeless tobacco: Never Used  Substance and Sexual Activity  . Alcohol use: Never    Frequency: Never  . Drug use: Never    Types: Marijuana, Heroin    Comment: denies drug use  . Sexual activity: Not Currently  Lifestyle  . Physical activity:    Days per week: Not on file    Minutes per session: Not on file  . Stress: Not  on file  Relationships  . Social connections:    Talks on phone: Not on file    Gets together: Not on file    Attends religious service: Not on file    Active member of club or organization: Not on file    Attends meetings of clubs or organizations: Not on file    Relationship status: Not on file  Other Topics Concern  . Not on file  Social History Narrative   ** Merged History Encounter **       Additional Social History:                         Sleep: Good  Appetite:  Good  Current Medications: Current Facility-Administered Medications  Medication Dose Route Frequency Provider Last Rate Last Dose  . feeding supplement (ENSURE ENLIVE) (ENSURE ENLIVE) liquid 237 mL  237 mL Oral BID BM Javayah Magaw,  Rockey SituFernando A, MD   237 mL at 02/21/19 1403  . FLUoxetine (PROZAC) capsule 10 mg  10 mg Oral Daily Kohei Antonellis, Rockey SituFernando A, MD   10 mg at 02/22/19 0839  . hydrOXYzine (ATARAX/VISTARIL) tablet 25 mg  25 mg Oral Q6H PRN Kerry HoughSimon, Spencer E, PA-C   25 mg at 02/21/19 1933  . LORazepam (ATIVAN) tablet 1 mg  1 mg Oral Q6H PRN Mashell Sieben, Rockey SituFernando A, MD      . multivitamin with minerals tablet 1 tablet  1 tablet Oral Daily Kalix Meinecke, Rockey SituFernando A, MD   1 tablet at 02/22/19 0839  . pneumococcal 23 valent vaccine (PNU-IMMUNE) injection 0.5 mL  0.5 mL Intramuscular Tomorrow-1000 Murriel Holwerda A, MD      . QUEtiapine (SEROQUEL) tablet 50 mg  50 mg Oral QHS Xavier Fournier, Rockey SituFernando A, MD   50 mg at 02/21/19 2133  . thiamine (VITAMIN B-1) tablet 100 mg  100 mg Oral Daily Rubel Heckard, Rockey SituFernando A, MD   100 mg at 02/22/19 [id number]    Lab Results:  Results for orders placed or performed during the hospital encounter of 02/20/19 (from the past 48 hour(s))  TSH     Status: None   Collection Time: 02/22/19  6:23 AM  Result Value Ref Range   TSH 2.225 0.350 - 4.500 uIU/mL    Comment: Performed by a 3rd Generation assay with a functional sensitivity of <=0.01 uIU/mL. Performed at [facility], [address] [place], [place] [address]   Lipid panel     Status: None   Collection Time: 02/22/19  6:23 AM  Result Value Ref Range   Cholesterol 139 0 - 200 mg/dL   Triglycerides 37 <409<150 mg/dL   HDL 66 >81>40 mg/dL   Total CHOL/HDL Ratio 2.1 RATIO   VLDL 7 0 - 40 mg/dL   LDL Cholesterol 66 0 - 99 mg/dL    Comment:        Total Cholesterol/HDL:CHD Risk Coronary Heart Disease Risk Table                     Men   Women  1/2 Average Risk   3.4   3.3  Average Risk       5.0   4.4  2 X Average Risk   9.6   7.1  3 X Average Risk  23.4   11.0        Use the calculated Patient Ratio above and the CHD Risk Table to determine the patient's CHD Risk.        ATP  III CLASSIFICATION (LDL):  <100     mg/dL   Optimal  419-379  mg/dL   Near or  Above                    Optimal  130-159  mg/dL   Borderline  024-097  mg/dL   High  >353     mg/dL   Very High Performed at [facility], [address], [place], [place] [address]   Hemoglobin A1c     Status: None   Collection Time: 02/22/19  6:23 AM  Result Value Ref Range   Hgb A1c MFr Bld 5.0 4.8 - 5.6 %    Comment: (NOTE) Pre diabetes:          5.7%-6.4% Diabetes:              >6.4% Glycemic control for   <7.0% adults with diabetes    Mean Plasma Glucose 96.8 mg/dL    Comment: Performed at [facility], [address] [place], [place] [address]    Blood Alcohol level:  Lab Results  Component Value Date   ETH <10 02/19/2019   ETH <10 02/16/2019    Metabolic Disorder Labs: Lab Results  Component Value Date   HGBA1C 5.0 02/22/2019   MPG 96.8 02/22/2019   No results found for: PROLACTIN Lab Results  Component Value Date   CHOL 139 02/22/2019   TRIG 37 02/22/2019   HDL 66 02/22/2019   CHOLHDL 2.1 02/22/2019   VLDL 7 02/22/2019   LDLCALC 66 02/22/2019    Physical Findings: AIMS:  , ,  ,  ,    CIWA:  CIWA-Ar Total: 1 COWS:     Musculoskeletal: Strength & Muscle Tone: within normal limits Gait & Station: normal Patient leans: N/A  Psychiatric Specialty Exam: Physical Exam  Nursing note and vitals reviewed. Constitutional: He is oriented to person, place, and time. He appears well-developed and well-nourished.  Cardiovascular: Normal rate.  Respiratory: Effort normal.  Musculoskeletal: Normal range of motion.  Neurological: He is alert and oriented to person, place, and time.  Skin: Skin is warm.    Review of Systems  Constitutional: Negative.   HENT: Negative.   Eyes: Negative.   Respiratory: Negative.   Cardiovascular: Negative.   Gastrointestinal: Negative.   Genitourinary: Negative.   Musculoskeletal: Negative.   Skin: Negative.   Neurological: Negative.   Endo/Heme/Allergies: Negative.   Psychiatric/Behavioral:  Positive for substance abuse.    Blood pressure 110/69, pulse 67, temperature 98.5 F (36.9 C), resp. rate 18, height 5\' 11"  (1.803 m), weight 75.7 kg, SpO2 100 %.Body mass index is 23.28 kg/m.  General Appearance: Casual  Eye Contact:  Good  Speech:  Clear and Coherent and Normal Rate  Volume:  Normal  Mood:  Euthymic  Affect:  Congruent  Thought Process:  Coherent and Descriptions of Associations: Intact  Orientation:  Full (Time, Place, and Person)  Thought Content:  WDL  Suicidal Thoughts:  No  Homicidal Thoughts:  No  Memory:  Immediate;   Good Recent;   Good Remote;   Good  Judgement:  Fair  Insight:  Fair  Psychomotor Activity:  Normal  Concentration:  Concentration: Good and Attention Span: Good  Recall:  Good  Fund of Knowledge:  Good  Language:  Good  Akathisia:  No  Handed:  Right  AIMS (if indicated):     Assets:  Communication Skills Desire for Improvement Financial Resources/Insurance Housing Physical Health Social Support  Transportation  ADL's:  Intact  Cognition:  WNL  Sleep:  Number of Hours: 6   Problems Addressed MDD severe recurrent Polysubstance abuse  Treatment Plan Summary: Daily contact with patient to assess and evaluate symptoms and progress in treatment, Medication management and Plan is to: Continue Prozac 10 mg p.o. daily for MDD Continue Vistaril 25 mg p.o. every 6 hours PRN for anxiety Continue Ativan as needed detox protocol Continue Seroquel 50 mg p.o. nightly Encourage group therapy participation Continue every 15 minute safety checks CSW to assist with disposition   Maryfrances Bunnell, FNP 02/22/2019, 10:44 AM   Attest to NP Progress Note

## 2019-02-22 NOTE — Progress Notes (Addendum)
D    Pt is appropriate and pleasant    He said he was not happy because someone called and told staff that he was suicidal   Pt denies suicidal thoughts at this time   He said he wanted to take his HS medications early  A   Verbal support given   Medications administered and effectiveness monitored   Q 15 min checks R   Pt is safe at this time  [place] NOVEL CORONAVIRUS (COVID-19) DAILY CHECK-OFF SYMPTOMS - answer yes or no to each - every day NO YES  Have you had a fever in the past 24 hours?  . Fever (Temp > 37.80C / 100F) X   Have you had any of these symptoms in the past 24 hours? . New Cough .  Sore Throat  .  Shortness of Breath .  Difficulty Breathing .  Unexplained Body Aches   X   Have you had any one of these symptoms in the past 24 hours not related to allergies?   . Runny Nose .  Nasal Congestion .  Sneezing   X   If you have had runny nose, nasal congestion, sneezing in the past 24 hours, has it worsened?  X   EXPOSURES - check yes or no X   Have you traveled outside the state in the past 14 days?  X   Have you been in contact with someone with a confirmed diagnosis of COVID-19 or PUI in the past 14 days without wearing appropriate PPE?  X   Have you been living in the same home as a person with confirmed diagnosis of COVID-19 or a PUI (household contact)?    X   Have you been diagnosed with COVID-19?    X              What to do next: Answered NO to all: Answered YES to anything:   Proceed with unit schedule Follow the [facility] Inpatient Flowsheet.
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
## 2019-02-22 NOTE — Care Management (Signed)
CMA made referral to [facility].   CMA will follow up with Elizabeth, Admission Coordinator.  CMA will notify LCSWA, [place].   Foxx Klarich Care Management Assistant  Email:[email or web address] Office: [phone]   

## 2019-02-22 NOTE — Tx Team (Signed)
Interdisciplinary Treatment and Diagnostic Plan Update  02/22/2019 Time of Session: 9:30am Tristan [name] MRN: [id number]  Principal Diagnosis: MDD (major depressive disorder), recurrent episode, severe (HCC)  Secondary Diagnoses: Principal Problem:   MDD (major depressive disorder), recurrent episode, severe (HCC) Active Problems:   Polysubstance abuse (HCC)   Current Medications:  Current Facility-Administered Medications  Medication Dose Route Frequency Provider Last Rate Last Dose  . feeding supplement (ENSURE ENLIVE) (ENSURE ENLIVE) liquid 237 mL  237 mL Oral BID BM Cobos, Rockey Situ, MD   237 mL at 02/21/19 1403  . FLUoxetine (PROZAC) capsule 10 mg  10 mg Oral Daily Cobos, Rockey Situ, MD   10 mg at 02/22/19 0839  . hydrOXYzine (ATARAX/VISTARIL) tablet 25 mg  25 mg Oral Q6H PRN Kerry Hough, PA-C   25 mg at 02/21/19 1933  . LORazepam (ATIVAN) tablet 1 mg  1 mg Oral Q6H PRN Cobos, Rockey Situ, MD      . multivitamin with minerals tablet 1 tablet  1 tablet Oral Daily Cobos, Rockey Situ, MD   1 tablet at 02/22/19 0839  . pneumococcal 23 valent vaccine (PNU-IMMUNE) injection 0.5 mL  0.5 mL Intramuscular Tomorrow-1000 Cobos, Fernando A, MD      . QUEtiapine (SEROQUEL) tablet 50 mg  50 mg Oral QHS Cobos, Rockey Situ, MD   50 mg at 02/21/19 2133  . thiamine (VITAMIN B-1) tablet 100 mg  100 mg Oral Daily Cobos, Rockey Situ, MD   100 mg at 02/22/19 1497   PTA Medications: Medications Prior to Admission  Medication Sig Dispense Refill Last Dose  . FLUoxetine (PROZAC) 20 MG capsule Take 1 capsule (20 mg total) by mouth daily. (Patient not taking: Reported on 02/19/2019) 30 capsule 2 Not Taking at Unknown time  . gabapentin (NEURONTIN) 300 MG capsule Take 1 capsule (300 mg total) by mouth 3 (three) times daily. (Patient not taking: Reported on 02/19/2019) 90 capsule 1 Not Taking at Unknown time    Patient Stressors: Financial difficulties Occupational concerns Substance  abuse  Patient Strengths: Ability for insight Communication skills Supportive family/friends  Treatment Modalities: Medication Management, Group therapy, Case management,  1 to 1 session with clinician, Psychoeducation, Recreational therapy.   Physician Treatment Plan for Primary Diagnosis: MDD (major depressive disorder), recurrent episode, severe (HCC) Long Term Goal(s): Improvement in symptoms so as ready for discharge Improvement in symptoms so as ready for discharge   Short Term Goals: Ability to identify changes in lifestyle to reduce recurrence of condition will improve Ability to verbalize feelings will improve Ability to disclose and discuss suicidal ideas Ability to demonstrate self-control will improve Ability to identify and develop effective coping behaviors will improve Ability to identify changes in lifestyle to reduce recurrence of condition will improve Ability to verbalize feelings will improve Ability to disclose and discuss suicidal ideas Ability to demonstrate self-control will improve Ability to identify and develop effective coping behaviors will improve Ability to maintain clinical measurements within normal limits will improve  Medication Management: Evaluate patient's response, side effects, and tolerance of medication regimen.  Therapeutic Interventions: 1 to 1 sessions, Unit Group sessions and Medication administration.  Evaluation of Outcomes: Progressing  Physician Treatment Plan for Secondary Diagnosis: Principal Problem:   MDD (major depressive disorder), recurrent episode, severe (HCC) Active Problems:   Polysubstance abuse (HCC)  Long Term Goal(s): Improvement in symptoms so as ready for discharge Improvement in symptoms so as ready for discharge   Short Term Goals: Ability to identify changes in lifestyle to  reduce recurrence of condition will improve Ability to verbalize feelings will improve Ability to disclose and discuss suicidal  ideas Ability to demonstrate self-control will improve Ability to identify and develop effective coping behaviors will improve Ability to identify changes in lifestyle to reduce recurrence of condition will improve Ability to verbalize feelings will improve Ability to disclose and discuss suicidal ideas Ability to demonstrate self-control will improve Ability to identify and develop effective coping behaviors will improve Ability to maintain clinical measurements within normal limits will improve     Medication Management: Evaluate patient's response, side effects, and tolerance of medication regimen.  Therapeutic Interventions: 1 to 1 sessions, Unit Group sessions and Medication administration.  Evaluation of Outcomes: Progressing   RN Treatment Plan for Primary Diagnosis: MDD (major depressive disorder), recurrent episode, severe (HCC) Long Term Goal(s): Knowledge of disease and therapeutic regimen to maintain health will improve  Short Term Goals: Ability to participate in decision making will improve, Ability to verbalize feelings will improve, Ability to disclose and discuss suicidal ideas, Ability to identify and develop effective coping behaviors will improve and Compliance with prescribed medications will improve  Medication Management: RN will administer medications as ordered by provider, will assess and evaluate patient's response and provide education to patient for prescribed medication. RN will report any adverse and/or side effects to prescribing provider.  Therapeutic Interventions: 1 on 1 counseling sessions, Psychoeducation, Medication administration, Evaluate responses to treatment, Monitor vital signs and CBGs as ordered, Perform/monitor CIWA, COWS, AIMS and Fall Risk screenings as ordered, Perform wound care treatments as ordered.  Evaluation of Outcomes: Progressing   LCSW Treatment Plan for Primary Diagnosis: MDD (major depressive disorder), recurrent episode,  severe (HCC) Long Term Goal(s): Safe transition to appropriate next level of care at discharge, Engage patient in therapeutic group addressing interpersonal concerns.  Short Term Goals: Engage patient in aftercare planning with referrals and resources and Increase skills for wellness and recovery  Therapeutic Interventions: Assess for all discharge needs, 1 to 1 time with Social worker, Explore available resources and support systems, Assess for adequacy in community support network, Educate family and significant other(s) on suicide prevention, Complete Psychosocial Assessment, Interpersonal group therapy.  Evaluation of Outcomes: Progressing   Progress in Treatment: Attending groups: Yes. Participating in groups: Yes. Taking medication as prescribed: Yes. Toleration medication: Yes. Family/Significant other contact made: No, will contact:  pt's father Patient understands diagnosis: Yes. Discussing patient identified problems/goals with staff: Yes. Medical problems stabilized or resolved: Yes. Denies suicidal/homicidal ideation: Yes. Issues/concerns per patient self-inventory: No. Other:   New problem(s) identified: No, Describe:  None  New Short Term/Long Term Goal(s):  Patient Goals:  "I want to get a proper diagnosis and get my medications stable"  Discharge Plan or Barriers:   Reason for Continuation of Hospitalization: Depression Medication stabilization  Estimated Length of Stay: 2-3 days  Attendees: Patient: 02/22/2019   Physician: Dr. Nehemiah MassedFernando Cobos, MD 02/22/2019  Nursing: Marchelle FolksAmanda, RN 02/22/2019   RN Care Manager: 02/22/2019   Social Worker: Stephannie PetersJasmine Lyndle Pang, LCSW 02/22/2019  Recreational Therapist:  02/22/2019   Other:  02/22/2019   Other:  02/22/2019   Other: 02/22/2019     Scribe for Treatment Team: Delphia GratesJasmine M Lan Mcneill, LCSW 02/22/2019 11:00 AM

## 2019-02-22 NOTE — Progress Notes (Signed)
Recreation Therapy Notes  Date:  6.1.20 Time: 0930 Location: [address] Dayroom  Group Topic: Stress Management  Goal Area(s) Addresses:  Patient will identify positive stress management techniques. Patient will identify benefits of using stress management post d/c.  Intervention:  Stress Management  Activity :  Meditation.  LRT introduced the stress management technique of meditation.  LRT played a meditation that focused on make the most of your day.   Education:  Stress Management, Discharge Planning.   Education Outcome: Acknowledges Education  Clinical Observations/Feedback:  Pt did not attend group.    Caroll Rancher, LRT/CTRS         Lillia Abed, Mettie Roylance A 02/22/2019 11:11 AM

## 2019-02-23 DIAGNOSIS — F1910 Other psychoactive substance abuse, uncomplicated: Secondary | ICD-10-CM

## 2019-02-23 DIAGNOSIS — F332 Major depressive disorder, recurrent severe without psychotic features: Secondary | ICD-10-CM

## 2019-02-23 MED ORDER — FLUOXETINE HCL 10 MG PO CAPS: 10.0000 mg | ORAL_CAPSULE | Freq: Every day | ORAL | 0 refills | Status: DC

## 2019-02-23 MED ORDER — QUETIAPINE FUMARATE 50 MG PO TABS: 50.0000 mg | ORAL_TABLET | Freq: Every day | ORAL | 0 refills | Status: DC

## 2019-02-23 MED ORDER — HYDROXYZINE HCL 25 MG PO TABS: 25.0000 mg | ORAL_TABLET | Freq: Four times a day (QID) | ORAL | 0 refills | Status: DC | PRN
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
## 2019-02-23 NOTE — Progress Notes (Signed)
  [facility] Adult Case Management Discharge Plan :  Will you be returning to the same living situation after discharge:  Yes,  with mother until he goes to [facility] on 6/3 at 7:45am for treatment At discharge, do you have transportation home?: Yes,  mom is picking him up Do you have the ability to pay for your medications: No.; [facility]  Release of information consent forms completed and in the chart;  Patient's signature needed at discharge.  Patient to Follow up at: Follow-up Information    [facility] Follow up on 02/24/2019.   Why:  Please attend your screening for admission on Wednesday, 6/3 at 7:45a. Be sure to bring photo ID, proof of residency, 30 day supply of medication and 2 weeks worth of clothing.  Contact information: [address] [place] [address] [phone]           Next level of care provider has access to [facility] Link:no  Safety Planning and Suicide Prevention discussed: Yes,  with pt's father  Have you used any form of tobacco in the last 30 days? (Cigarettes, Smokeless Tobacco, Cigars, and/or Pipes): Yes  Has patient been referred to the Quitline?: Patient refused referral  Patient has been referred for addiction treatment: Yes  [name], LCSW 02/23/2019, 9:36 AM

## 2019-02-23 NOTE — [facility] Suicide Risk Assessment (Signed)
[facility] 2 Discharge Suicide Risk Assessment   Principal Problem: MDD (major depressive disorder), recurrent episode, severe (HCC) Discharge Diagnoses: Principal Problem:   MDD (major depressive disorder), recurrent episode, severe (HCC) Active Problems:   Polysubstance abuse (HCC)   Total Time spent with patient: 15 minutes  Musculoskeletal: Strength & Muscle Tone: within normal limits Gait & Station: normal Patient leans: N/A  Psychiatric Specialty Exam: Review of Systems  All other systems reviewed and are negative.   Blood pressure 110/77, pulse 68, temperature 98.4 F (36.9 C), temperature source Oral, resp. rate 16, height 5\' 11"  (1.803 m), weight 75.7 kg, SpO2 100 %.Body mass index is 23.28 kg/m.  General Appearance: Casual  Eye Contact::  Fair  Speech:  Normal Rate409  Volume:  Normal  Mood:  Euthymic  Affect:  Congruent  Thought Process:  Coherent and Descriptions of Associations: Intact  Orientation:  Full (Time, Place, and Person)  Thought Content:  Logical  Suicidal Thoughts:  No  Homicidal Thoughts:  No  Memory:  Immediate;   Fair Recent;   Fair Remote;   Fair  Judgement:  Intact  Insight:  Fair  Psychomotor Activity:  Normal  Concentration:  Fair  Recall:  [employer] of Knowledge:Good  Language: Good  Akathisia:  Negative  Handed:  Right  AIMS (if indicated):     Assets:  Desire for Improvement Resilience  Sleep:  Number of Hours: 5.5  Cognition: WNL  ADL's:  Intact   Mental Status Per Nursing Assessment::   On Admission:  Self-harm thoughts, Self-harm behaviors  Demographic Factors:  Male, Low socioeconomic status and Unemployed  Loss Factors: NA  Historical Factors: Impulsivity  Risk Reduction Factors:   Positive coping skills or problem solving skills  Continued Clinical Symptoms:  Depression:   Comorbid alcohol abuse/dependence Impulsivity Alcohol/Substance Abuse/Dependencies  Cognitive Features That Contribute To Risk:  None     Suicide Risk:  Minimal: No identifiable suicidal ideation.  Patients presenting with no risk factors but with morbid ruminations; may be classified as minimal risk based on the severity of the depressive symptoms    Plan Of Care/Follow-up recommendations:  Activity:  ad lib  Antonieta Pert, MD 02/23/2019, 8:02 AM

## 2019-02-23 NOTE — Progress Notes (Signed)
Spiritual care group on grief and loss facilitated by chaplain Burnis Kingfisher   Group opened with brief discussion and psycho-social ed around grief and loss in relationships and in relation to self - identifying life patterns, circumstances, changes that cause losses. Established group norm of speaking from own life experience. Group goal of establishing open and affirming space for members to share loss and experience with grief, normalize grief experience and provide psycho social education and grief support.    Tristan [name] was invited to group.  Did not attend

## 2019-02-23 NOTE — Progress Notes (Signed)
Pt discharged to lobby. Pt was stable and appreciative at that time. All papers, samples and prescriptions were given and valuables returned. Verbal understanding expressed. Denies SI/HI and A/VH. Pt given opportunity to express concerns and ask questions.  

## 2019-03-17 ENCOUNTER — Encounter (HOSPITAL_COMMUNITY): Payer: Self-pay | Admitting: Emergency Medicine

## 2019-03-17 ENCOUNTER — Emergency Department (HOSPITAL_COMMUNITY): Payer: No Typology Code available for payment source

## 2019-03-17 ENCOUNTER — Emergency Department (HOSPITAL_COMMUNITY)
Admission: EM | Admit: 2019-03-17 | Discharge: 2019-03-17 | Disposition: A | Discharge status: Home / Self Care | Payer: No Typology Code available for payment source | Attending: Emergency Medicine | Admitting: Emergency Medicine

## 2019-03-17 ENCOUNTER — Other Ambulatory Visit: Payer: Self-pay

## 2019-03-17 DIAGNOSIS — M542 Cervicalgia: Secondary | ICD-10-CM | POA: Insufficient documentation

## 2019-03-17 DIAGNOSIS — I10 Essential (primary) hypertension: Secondary | ICD-10-CM | POA: Insufficient documentation

## 2019-03-17 DIAGNOSIS — S0990XA Unspecified injury of head, initial encounter: Secondary | ICD-10-CM | POA: Diagnosis present

## 2019-03-17 DIAGNOSIS — Y92414 Local residential or business street as the place of occurrence of the external cause: Secondary | ICD-10-CM | POA: Diagnosis not present

## 2019-03-17 DIAGNOSIS — Z79899 Other long term (current) drug therapy: Secondary | ICD-10-CM | POA: Insufficient documentation

## 2019-03-17 DIAGNOSIS — F17210 Nicotine dependence, cigarettes, uncomplicated: Secondary | ICD-10-CM | POA: Diagnosis not present

## 2019-03-17 DIAGNOSIS — M25512 Pain in left shoulder: Secondary | ICD-10-CM | POA: Diagnosis not present

## 2019-03-17 DIAGNOSIS — S0083XA Contusion of other part of head, initial encounter: Secondary | ICD-10-CM | POA: Diagnosis not present

## 2019-03-17 DIAGNOSIS — Y999 Unspecified external cause status: Secondary | ICD-10-CM | POA: Insufficient documentation

## 2019-03-17 DIAGNOSIS — M5489 Other dorsalgia: Secondary | ICD-10-CM | POA: Diagnosis not present

## 2019-03-17 DIAGNOSIS — Y9389 Activity, other specified: Secondary | ICD-10-CM | POA: Diagnosis not present

## 2019-03-17 MED ORDER — ACETAMINOPHEN 325 MG PO TABS
650.0000 mg | ORAL_TABLET | Freq: Once | ORAL | Status: AC
  Administered 2019-03-17: 650 mg via ORAL
  Filled 2019-03-17: qty 2

## 2019-03-17 NOTE — Discharge Instructions (Signed)
It was my pleasure taking care of you today!   Tylenol and/or ibuprofen as needed for pain.  Ice will help as well.   Follow up with your primary care doctor.   Return to ER for new or worsening symptoms, any additional concerns.
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
## 2019-09-27 ENCOUNTER — Emergency Department (HOSPITAL_COMMUNITY)
Admission: EM | Admit: 2019-09-27 | Discharge: 2019-09-28 | Disposition: A | Discharge status: Other Acute Inpatient Hospital | Payer: Self-pay | Attending: Emergency Medicine | Admitting: Emergency Medicine

## 2019-09-27 ENCOUNTER — Other Ambulatory Visit: Payer: Self-pay

## 2019-09-27 ENCOUNTER — Encounter (HOSPITAL_COMMUNITY): Payer: Self-pay | Admitting: Emergency Medicine

## 2019-09-27 DIAGNOSIS — F17200 Nicotine dependence, unspecified, uncomplicated: Secondary | ICD-10-CM | POA: Insufficient documentation

## 2019-09-27 DIAGNOSIS — Z79899 Other long term (current) drug therapy: Secondary | ICD-10-CM | POA: Insufficient documentation

## 2019-09-27 DIAGNOSIS — I10 Essential (primary) hypertension: Secondary | ICD-10-CM | POA: Insufficient documentation

## 2019-09-27 DIAGNOSIS — F1910 Other psychoactive substance abuse, uncomplicated: Secondary | ICD-10-CM | POA: Insufficient documentation

## 2019-09-27 DIAGNOSIS — R45851 Suicidal ideations: Secondary | ICD-10-CM | POA: Insufficient documentation

## 2019-09-27 DIAGNOSIS — Z20822 Contact with and (suspected) exposure to covid-19: Secondary | ICD-10-CM | POA: Insufficient documentation

## 2019-09-27 DIAGNOSIS — F333 Major depressive disorder, recurrent, severe with psychotic symptoms: Secondary | ICD-10-CM | POA: Insufficient documentation

## 2019-09-27 LAB — CBC
HCT: 39.7 % (ref 39.0–52.0)
Hemoglobin: 13.5 g/dL (ref 13.0–17.0)
MCH: 26.8 pg (ref 26.0–34.0)
MCHC: 34 g/dL (ref 30.0–36.0)
MCV: 78.8 fL — ABNORMAL LOW (ref 80.0–100.0)
Platelets: 280 10*3/uL (ref 150–400)
RBC: 5.04 MIL/uL (ref 4.22–5.81)
RDW: 14.5 % (ref 11.5–15.5)
WBC: 11.1 10*3/uL — ABNORMAL HIGH (ref 4.0–10.5)
nRBC: 0 % (ref 0.0–0.2)

## 2019-09-27 LAB — COMPREHENSIVE METABOLIC PANEL
ALT: 17 U/L (ref 0–44)
AST: 28 U/L (ref 15–41)
Albumin: 4.8 g/dL (ref 3.5–5.0)
Alkaline Phosphatase: 59 U/L (ref 38–126)
Anion gap: 12 (ref 5–15)
BUN: 19 mg/dL (ref 6–20)
CO2: 25 mmol/L (ref 22–32)
Calcium: 9.7 mg/dL (ref 8.9–10.3)
Chloride: 101 mmol/L (ref 98–111)
Creatinine, Ser: 1.2 mg/dL (ref 0.61–1.24)
GFR calc Af Amer: >60 mL/min (ref >60)
GFR calc non Af Amer: >60 mL/min (ref >60)
Glucose, Bld: 93 mg/dL (ref 70–99)
Potassium: 4.2 mmol/L (ref 3.5–5.1)
Sodium: 138 mmol/L (ref 135–145)
Total Bilirubin: 1.3 mg/dL — ABNORMAL HIGH (ref 0.3–1.2)
Total Protein: 8.1 g/dL (ref 6.5–8.1)

## 2019-09-27 LAB — RESPIRATORY PANEL BY RT PCR (FLU A&B, COVID)
Influenza A by PCR: NEGATIVE
Influenza B by PCR: NEGATIVE
SARS Coronavirus 2 by RT PCR: NEGATIVE

## 2019-09-27 LAB — ETHANOL: Alcohol, Ethyl (B): <10 mg/dL (ref <10)

## 2019-09-27 LAB — ACETAMINOPHEN LEVEL: Acetaminophen (Tylenol), Serum: <10 ug/mL — ABNORMAL LOW (ref 10–30)

## 2019-09-27 LAB — SALICYLATE LEVEL: Salicylate Lvl: <7 mg/dL — ABNORMAL LOW (ref 7.0–30.0)

## 2019-09-27 MED ORDER — ACETAMINOPHEN 325 MG PO TABS: 650.0000 mg | ORAL_TABLET | ORAL | Status: DC | PRN

## 2019-09-27 MED ORDER — ALUM & MAG HYDROXIDE-SIMETH 200-200-20 MG/5ML PO SUSP: 30.0000 mL | Freq: Four times a day (QID) | ORAL | Status: DC | PRN

## 2019-09-27 MED ORDER — QUETIAPINE FUMARATE 50 MG PO TABS
50.0000 mg | ORAL_TABLET | Freq: Every day | ORAL | Status: DC
  Filled 2019-09-27: qty 1

## 2019-09-27 MED ORDER — HYDROXYZINE HCL 25 MG PO TABS: 25.0000 mg | ORAL_TABLET | Freq: Four times a day (QID) | ORAL | Status: DC | PRN

## 2019-09-27 MED ORDER — ONDANSETRON HCL 4 MG PO TABS: 4.0000 mg | ORAL_TABLET | Freq: Three times a day (TID) | ORAL | Status: DC | PRN

## 2019-09-27 MED ORDER — FLUOXETINE HCL 10 MG PO CAPS
10.0000 mg | ORAL_CAPSULE | Freq: Every day | ORAL | Status: DC
  Administered 2019-09-28: 10 mg via ORAL
  Filled 2019-09-27 (×2): qty 1

## 2019-09-27 NOTE — BH Assessment (Signed)
[facility] Assessment Progress Note   Clinician attempted to see patient for teleassessment.  Patient did not respond, he lay on the bed with no movement or interaction.  Officers entreated him to participate but he did not acknowledge them either.  Clinician talked to Dr. Charm Barges and let him know TTS would attempt to see patient when he is alert.
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
## 2019-09-27 NOTE — Progress Notes (Signed)
Received Tristan [name] from the main ED, he was oriented to his new environment and eventually drifted off to sleep. This writer could not wake him up to safety administer his PM  Medications. He slept throughout the night without distress.

## 2019-09-27 NOTE — ED Triage Notes (Signed)
Pt presents by [facility] for evaluation of Suicidal ideation with wanting to take officers gun to shoot himself. Pt reported hallucinations to EMS. [employer] with pt at this time.

## 2019-09-28 ENCOUNTER — Observation Stay (HOSPITAL_COMMUNITY)
Admission: AD | Admit: 2019-09-28 | Discharge: 2019-09-29 | Payer: No Typology Code available for payment source | Attending: Psychiatry | Admitting: Psychiatry

## 2019-09-28 ENCOUNTER — Encounter (HOSPITAL_COMMUNITY): Payer: Self-pay | Admitting: Clinical

## 2019-09-28 ENCOUNTER — Encounter (HOSPITAL_COMMUNITY): Payer: Self-pay | Admitting: Psychiatry

## 2019-09-28 DIAGNOSIS — F322 Major depressive disorder, single episode, severe without psychotic features: Secondary | ICD-10-CM | POA: Diagnosis present

## 2019-09-28 DIAGNOSIS — I10 Essential (primary) hypertension: Secondary | ICD-10-CM | POA: Diagnosis not present

## 2019-09-28 DIAGNOSIS — F1994 Other psychoactive substance use, unspecified with psychoactive substance-induced mood disorder: Secondary | ICD-10-CM | POA: Diagnosis not present

## 2019-09-28 DIAGNOSIS — F17210 Nicotine dependence, cigarettes, uncomplicated: Secondary | ICD-10-CM | POA: Diagnosis not present

## 2019-09-28 DIAGNOSIS — Z79899 Other long term (current) drug therapy: Secondary | ICD-10-CM | POA: Insufficient documentation

## 2019-09-28 DIAGNOSIS — F319 Bipolar disorder, unspecified: Secondary | ICD-10-CM | POA: Diagnosis not present

## 2019-09-28 DIAGNOSIS — F4310 Post-traumatic stress disorder, unspecified: Secondary | ICD-10-CM | POA: Diagnosis not present

## 2019-09-28 DIAGNOSIS — F1910 Other psychoactive substance abuse, uncomplicated: Secondary | ICD-10-CM | POA: Diagnosis present

## 2019-09-28 LAB — URINALYSIS, ROUTINE W REFLEX MICROSCOPIC
Bilirubin Urine: NEGATIVE
Glucose, UA: NEGATIVE mg/dL
Hgb urine dipstick: NEGATIVE
Ketones, ur: 20 mg/dL — AB
Leukocytes,Ua: NEGATIVE
Nitrite: NEGATIVE
Protein, ur: 30 mg/dL — AB
Specific Gravity, Urine: 1.032 — ABNORMAL HIGH (ref 1.005–1.030)
pH: 5 (ref 5.0–8.0)

## 2019-09-28 LAB — TSH: TSH: 0.192 u[IU]/mL — ABNORMAL LOW (ref 0.350–4.500)

## 2019-09-28 LAB — RAPID URINE DRUG SCREEN, HOSP PERFORMED
Amphetamines: POSITIVE — AB
Barbiturates: NOT DETECTED
Benzodiazepines: NOT DETECTED
Cocaine: POSITIVE — AB
Opiates: NOT DETECTED
Tetrahydrocannabinol: POSITIVE — AB

## 2019-09-28 MED ORDER — QUETIAPINE FUMARATE 50 MG PO TABS
50.0000 mg | ORAL_TABLET | Freq: Every day | ORAL | Status: DC
  Administered 2019-09-28: 50 mg via ORAL
  Filled 2019-09-28: qty 1

## 2019-09-28 MED ORDER — FLUOXETINE HCL 10 MG PO CAPS
10.0000 mg | ORAL_CAPSULE | Freq: Every day | ORAL | Status: DC
  Administered 2019-09-29: 10 mg via ORAL
  Filled 2019-09-28 (×2): qty 1

## 2019-09-28 MED ORDER — HYDROXYZINE HCL 25 MG PO TABS: 25.0000 mg | ORAL_TABLET | Freq: Four times a day (QID) | ORAL | Status: DC | PRN

## 2019-09-28 MED ORDER — ALUM & MAG HYDROXIDE-SIMETH 200-200-20 MG/5ML PO SUSP: 30.0000 mL | Freq: Four times a day (QID) | ORAL | Status: DC | PRN

## 2019-09-28 MED ORDER — ACETAMINOPHEN 325 MG PO TABS: 650.0000 mg | ORAL_TABLET | ORAL | Status: DC | PRN

## 2019-09-28 NOTE — ED Notes (Signed)
Report given to [facility].  Will draw TSH and call transport. (Lab could not add it on.)

## 2019-09-28 NOTE — ED Notes (Signed)
Tried to call report to [employer] and was put on hold for long period.

## 2019-09-28 NOTE — BH Assessment (Addendum)
[facility] Assessment Progress Note    Clinician spoke with nurse Randa Evens.  She said that patient was still too sleepy to participate in assessment.  He did not wake up for medications.  TTS to check later to see if patient is alert enough for assessment.
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
## 2019-09-28 NOTE — ED Notes (Signed)
Transported to [facility] by [employer].  All belongings returned to [employer].  Pt was calm and cooperative with the transfer.

## 2019-09-28 NOTE — ED Notes (Signed)
Per Janice Coffin, pt can go to room 403-1 at 1:00.  Report has been called to [employer].

## 2019-09-28 NOTE — ED Notes (Signed)
Tried a different number for Observation Unit and it went to voice mail.

## 2019-09-28 NOTE — Discharge Summary (Addendum)
Patient is to be transferred to [facility] Observation Unit.   Attest to NP Note

## 2019-09-28 NOTE — Progress Notes (Signed)

## 2019-09-28 NOTE — BH Assessment (Signed)
Assessment Note  Tristan [name] is an 34 y.o. male presenting to [facility] ED under IVC. Per IVC, initiated by crisis counselor, "Respondent is found by responding officers and crisis counselor stating that he had a plan to kill himself today. He stated he was experiencing suicidal ideations. Respondent has been hostile and aggressive towards others. Was also seen cutting himself earlier this date. Is a danger to himself and others."  Upon this counselor's exam he is cooperative, however guarded. Patient reports current SI with a plan to light himself on fire or shoot himself. Patient also admits to cutting himself yesterday. He endorses depressive symptoms including hopelessness, worthlessness, irritability, fatigue, anhedonia, isolation, insomnia, and poor concentration. He denies current HI. Patient reports AVH of "evil things." He states he has one prior suicide attempt. Patient denies any substance use. Patient reports he has an upcoming court date but states he does not remember what for. He does not have an outpatient provider.  Patient is alert and oriented x 4. He is dressed in scrubs, laying in hospital bed. His speech is logical, eye contact is fair, and thoughts are organized. His mood is depressed and affect is congruent. He has fair insight, judgement, and impulse control. Patient does not appear to be responding to internal sitmuli or experiencing delusional thought content at time of assessment.  Diagnosis: F33.3 MDD, recurrent episode, with psychotic features  Past Medical History:  Past Medical History:  Diagnosis Date  . Bipolar 1 disorder (HCC)   . Hypertension   . PTSD (post-traumatic stress disorder)     Past Surgical History:  Procedure Laterality Date  . NO PAST SURGERIES      Family History:  Family History  Problem Relation Age of Onset  . Other Father   . Psychiatric Illness Father     Social History:  reports that he has been smoking. He has been smoking  about 0.50 packs per day. He has never used smokeless tobacco. He reports that he does not drink alcohol or use drugs.  Additional Social History:  Alcohol / Drug Use Pain Medications: see MAR Prescriptions: see MAR Over the Counter: see MAR History of alcohol / drug use?: No history of alcohol / drug abuse  CIWA: CIWA-Ar BP: 90/68 Pulse Rate: 74 COWS:    Allergies:  Allergies  Allergen Reactions  . Tomato     Home Medications: (Not in a hospital admission)   OB/GYN Status:  No LMP for male patient.  General Assessment Data Assessment unable to be completed: Yes Reason for not completing assessment: Pt non participatory Location of Assessment: [facility] TTS Assessment: In system Is this a Tele or Face-to-Face Assessment?: Face-to-Face Is this an Initial Assessment or a Re-assessment for this encounter?: Initial Assessment Language Other than English: No Living Arrangements: Homeless/Shelter What gender do you identify as?: Male Marital status: Single Maiden name: Villalona Pregnancy Status: No Living Arrangements: Alone Can pt return to current living arrangement?: Yes Admission Status: Involuntary Petitioner: Police Is patient capable of signing voluntary admission?: No Referral Source: Self/Family/Friend Insurance type: None     Crisis Care Plan Living Arrangements: Alone Legal Guardian: (self) Name of Psychiatrist: none Name of Therapist: none  Education Status Is patient currently in school?: No Is the patient employed, unemployed or receiving disability?: Unemployed  Risk to self with the past 6 months Suicidal Ideation: Yes-Currently Present Has patient been a risk to self within the past 6 months prior to admission? : Yes Suicidal Intent: Yes-Currently Present Has  patient had any suicidal intent within the past 6 months prior to admission? : Yes Is patient at risk for suicide?: Yes Suicidal Plan?: Yes-Currently Present Has patient had any suicidal  plan within the past 6 months prior to admission? : Yes Specify Current Suicidal Plan: light himself on fire or shoot himself Access to Means: Yes Specify Access to Suicidal Means: ability to do so What has been your use of drugs/alcohol within the last 12 months?: denies Previous Attempts/Gestures: Yes How many times?: 1 Other Self Harm Risks: none Triggers for Past Attempts: None known Intentional Self Injurious Behavior: Cutting Comment - Self Injurious Behavior: reports cut his arm yesterday Family Suicide History: No Recent stressful life event(s): Financial Problems Persecutory voices/beliefs?: Yes Depression: Yes Depression Symptoms: Despondent, Insomnia, Tearfulness, Isolating, Fatigue, Guilt, Loss of interest in usual pleasures, Feeling worthless/self pity, Feeling angry/irritable Substance abuse history and/or treatment for substance abuse?: No Suicide prevention information given to non-admitted patients: Not applicable  Risk to Others within the past 6 months Homicidal Ideation: No-Not Currently/Within Last 6 Months Does patient have any lifetime risk of violence toward others beyond the six months prior to admission? : No Thoughts of Harm to Others: No-Not Currently Present/Within Last 6 Months Current Homicidal Intent: No Current Homicidal Plan: No Access to Homicidal Means: No Identified Victim: none History of harm to others?: No Assessment of Violence: None Noted Violent Behavior Description: none Does patient have access to weapons?: No Criminal Charges Pending?: No Does patient have a court date: No Is patient on probation?: No  Psychosis Hallucinations: Auditory, Visual Delusions: None noted  Mental Status Report Appearance/Hygiene: In scrubs Eye Contact: Fair Motor Activity: Freedom of movement Speech: Logical/coherent Level of Consciousness: Alert Mood: Depressed Affect: Depressed Anxiety Level: Minimal Thought Processes: Coherent,  Relevant Judgement: Impaired Orientation: Person, Place, Time, Situation Obsessive Compulsive Thoughts/Behaviors: None  Cognitive Functioning Concentration: Normal Memory: Recent Intact, Remote Intact Is patient IDD: No Insight: Fair Impulse Control: Fair Appetite: Good Have you had any weight changes? : No Change Sleep: No Change Total Hours of Sleep: 8 Vegetative Symptoms: None  ADLScreening [facility] Assessment Services) Patient's cognitive ability adequate to safely complete daily activities?: Yes Patient able to express need for assistance with ADLs?: Yes Independently performs ADLs?: Yes (appropriate for developmental age)  Prior Inpatient Therapy Prior Inpatient Therapy: Yes Prior Therapy Dates: 2020 Prior Therapy Facilty/Provider(s): [facility] Reason for Treatment: SI  Prior Outpatient Therapy Prior Outpatient Therapy: No Does patient have an ACCT team?: No Does patient have Intensive In-House Services?  : No Does patient have [facility] services? : No Does patient have P4CC services?: No  ADL Screening (condition at time of admission) Patient's cognitive ability adequate to safely complete daily activities?: Yes Is the patient deaf or have difficulty hearing?: No Does the patient have difficulty seeing, even when wearing glasses/contacts?: No Does the patient have difficulty concentrating, remembering, or making decisions?: No Patient able to express need for assistance with ADLs?: Yes Does the patient have difficulty dressing or bathing?: No Independently performs ADLs?: Yes (appropriate for developmental age) Does the patient have difficulty walking or climbing stairs?: No Weakness of Legs: None Weakness of Arms/Hands: None  Home Assistive Devices/Equipment Home Assistive Devices/Equipment: None  Therapy Consults (therapy consults require a physician order) PT Evaluation Needed: No OT Evalulation Needed: No SLP Evaluation Needed: No Abuse/Neglect Assessment  (Assessment to be complete while patient is alone) Abuse/Neglect Assessment Can Be Completed: Unable to assess, patient is non-responsive or altered mental status Values /  Beliefs Cultural Requests During Hospitalization: None Spiritual Requests During Hospitalization: None Consults Spiritual Care Consult Needed: No Transition of Care Team Consult Needed: No Advance Directives (For Healthcare) Does Patient Have a Medical Advance Directive?: No Would patient like information on creating a medical advance directive?: No - Patient declined          Disposition: Shuvon Rankin, NP recommends in patient treatment. [place] to review. Disposition Initial Assessment Completed for this Encounter: Yes  On Site Evaluation by:   Reviewed with Physician:    Orvis Brill 09/28/2019 9:28 AM

## 2019-09-28 NOTE — ED Notes (Signed)
Tried to call report to Obs unit. No answer.

## 2019-09-28 NOTE — BH Assessment (Addendum)
BHH Assessment Progress Note  Per Nelly Rout, MD, this pt is to be admitted to the [facility] Observation Unit.  Percell Boston, RN has assigned pt to [facility] Rm 402-1.  Pt presents under IVC initiated by pt's health crisis counselor, and upheld by EDP Meridee Score, MD, and IVC documents have been sent to [facility].  Pt's nurse, Diane, has been notified, and agrees to call report to [phone].  Pt is to be transported via Patent examiner.   Doylene Canning, [place] Behavioral Health Coordinator [phone]    Addendum:  Pt's room assignment has been changed to 403-1.  The Observation Unit will be ready to receive pt at 13:00.  Diane has been notified.  Doylene Canning, [place] Behavioral Health Coordinator [phone]

## 2019-09-29 DIAGNOSIS — F1910 Other psychoactive substance abuse, uncomplicated: Secondary | ICD-10-CM

## 2019-09-29 DIAGNOSIS — F1994 Other psychoactive substance use, unspecified with psychoactive substance-induced mood disorder: Secondary | ICD-10-CM

## 2019-09-29 DIAGNOSIS — F322 Major depressive disorder, single episode, severe without psychotic features: Secondary | ICD-10-CM

## 2019-09-29 NOTE — Progress Notes (Signed)
Discharge:  Pt alert and orientated X4, all medication giving as Rx no adverse reaction observed. All items return and signed for. Pt giving all AVS instruction and resources. Pt discharged to security.    Tristan [name]. Melvyn Neth MSN, RN, [facility] [phone]

## 2019-09-29 NOTE — Discharge Summary (Addendum)
[facility] Psych Observation Discharge  09/29/2019 1:59 PM Tristan [name]  MRN:  [id number] Principal Problem: Substance induced mood disorder [facility]) Discharge Diagnoses: Principal Problem:   Substance induced mood disorder (HCC) Active Problems:   MDD (major depressive disorder), severe (HCC)   Polysubstance abuse (HCC)   Subjective: Tristan [name], 34 y.o., male patient seen via tele psych by this provider, Dr. Jama Flavors; and chart reviewed on 09/29/19.  On evaluation [name] reports he came to the hospital because he was having some issues and was looking for a place to go for rehab.  States he has spoke to family and is supposed to be going to mother's house after discharge and just need resources for rehab services and he will call and set up services once he gets home.  During evaluation Jerelyn Charles Karle is alert/oriented x 4; calm/cooperative; and mood is congruent with affect.  He does not appear to be responding to internal/external stimuli or delusional thoughts.  Patient denies suicidal/self-harm/homicidal ideation, psychosis, and paranoia.  Patient answered question appropriately.     Total Time spent with patient: 30 minutes  Past Psychiatric History: Depression, Substance use disorder, substance induced mood disorder  Past Medical History:  Past Medical History:  Diagnosis Date  . Bipolar 1 disorder (HCC)   . Hypertension   . PTSD (post-traumatic stress disorder)     Past Surgical History:  Procedure Laterality Date  . NO PAST SURGERIES     Family History:  Family History  Problem Relation Age of Onset  . Other Father   . Psychiatric Illness Father    Family Psychiatric  History: See above Social History:  Social History   Substance and Sexual Activity  Alcohol Use Never     Social History   Substance and Sexual Activity  Drug Use Never  . Types: Marijuana, Heroin   Comment: denies drug use    Social History    Socioeconomic History  . Marital status: Single    Spouse name: Not on file  . Number of children: Not on file  . Years of education: Not on file  . Highest education level: Not on file  Occupational History  . Not on file  Tobacco Use  . Smoking status: Current Every Day Smoker    Packs/day: 0.50  . Smokeless tobacco: Never Used  Substance and Sexual Activity  . Alcohol use: Never  . Drug use: Never    Types: Marijuana, Heroin    Comment: denies drug use  . Sexual activity: Not Currently  Other Topics Concern  . Not on file  Social History Narrative   ** Merged History Encounter **       Social Determinants of Health   Financial Resource Strain:   . Difficulty of Paying Living Expenses: Not on file  Food Insecurity:   . Worried About Programme researcher, broadcasting/film/video in the Last Year: Not on file  . Ran Out of Food in the Last Year: Not on file  Transportation Needs:   . Lack of Transportation (Medical): Not on file  . Lack of Transportation (Non-Medical): Not on file  Physical Activity:   . Days of Exercise per Week: Not on file  . Minutes of Exercise per Session: Not on file  Stress:   . Feeling of Stress : Not on file  Social Connections:   . Frequency of Communication with Friends and Family: Not on file  . Frequency of Social Gatherings with Friends and Family: Not on file  .  Attends Religious Services: Not on file  . Active Member of Clubs or Organizations: Not on file  . Attends Banker Meetings: Not on file  . Marital Status: Not on file    Has this patient used any form of tobacco in the last 30 days? (Cigarettes, Smokeless Tobacco, Cigars, and/or Pipes) A prescription for an FDA-approved tobacco cessation medication was offered at discharge and the patient refused  Current Medications: Current Facility-Administered Medications  Medication Dose Route Frequency Provider Last Rate Last Admin  . acetaminophen (TYLENOL) tablet 650 mg  650 mg Oral Q4H PRN  Rankin, Shuvon B, NP      . alum & mag hydroxide-simeth (MAALOX/MYLANTA) 200-200-20 MG/5ML suspension 30 mL  30 mL Oral Q6H PRN Rankin, Shuvon B, NP      . FLUoxetine (PROZAC) capsule 10 mg  10 mg Oral Daily Rankin, Shuvon B, NP   10 mg at 09/29/19 0858  . hydrOXYzine (ATARAX/VISTARIL) tablet 25 mg  25 mg Oral Q6H PRN Rankin, Shuvon B, NP      . QUEtiapine (SEROQUEL) tablet 50 mg  50 mg Oral QHS Rankin, Shuvon B, NP   50 mg at 09/28/19 2200   PTA Medications: Medications Prior to Admission  Medication Sig Dispense Refill Last Dose  . FLUoxetine (PROZAC) 10 MG capsule Take 1 capsule (10 mg total) by mouth daily. 30 capsule 0   . hydrOXYzine (ATARAX/VISTARIL) 25 MG tablet Take 1 tablet (25 mg total) by mouth every 6 (six) hours as needed for anxiety. 30 tablet 0   . QUEtiapine (SEROQUEL) 50 MG tablet Take 1 tablet (50 mg total) by mouth at bedtime. 30 tablet 0     Musculoskeletal: Strength & Muscle Tone: within normal limits Gait & Station: normal Patient leans: N/A  Psychiatric Specialty Exam: Physical Exam  Nursing note and vitals reviewed. Constitutional: He is oriented to person, place, and time. He appears well-developed and well-nourished. No distress.  Respiratory: Effort normal.  Musculoskeletal:        General: Normal range of motion.     Cervical back: Normal range of motion.  Neurological: He is alert and oriented to person, place, and time.  Skin: Skin is warm and dry.  Psychiatric: He has a normal mood and affect. His behavior is normal. Judgment and thought content normal.    Review of Systems  Psychiatric/Behavioral: Positive for substance abuse. Negative for depression, hallucinations, memory loss and suicidal ideas. The patient is not nervous/anxious and does not have insomnia.   All other systems reviewed and are negative.   Blood pressure 105/70, pulse (!) 102, temperature 97.9 F (36.6 C), temperature source Oral, resp. rate 20, height 5\' 9"  (1.753 m), weight  79.8 kg, SpO2 98 %.Body mass index is 25.99 kg/m.  General Appearance: Casual  Eye Contact:  Good  Speech:  Clear and Coherent and Normal Rate  Volume:  Normal  Mood:  "Good." Appropriate  Affect:  Appropriate and Congruent  Thought Process:  Coherent, Goal Directed and Descriptions of Associations: Intact  Orientation:  Full (Time, Place, and Person)  Thought Content:  WDL  Suicidal Thoughts:  No  Homicidal Thoughts:  No  Memory:  Immediate;   Good Recent;   Good  Judgement:  Intact  Insight:  Present  Psychomotor Activity:  Normal  Concentration:  Concentration: Good and Attention Span: Good  Recall:  Good  Fund of Knowledge:  Good  Language:  Good  Akathisia:  No  Handed:  Right  AIMS (if indicated):  Assets:  Communication Skills Desire for Improvement Housing Social Support  ADL's:  Intact  Cognition:  WNL  Sleep:        Demographic Factors:  Male and Unemployed  Loss Factors: Financial problems/change in socioeconomic status  Historical Factors: Family history of mental illness or substance abuse  Risk Reduction Factors:   Religious beliefs about death, Living with another person, especially a relative and Positive social support  Continued Clinical Symptoms:  Alcohol/Substance Abuse/Dependencies Previous Psychiatric Diagnoses and Treatments  Cognitive Features That Contribute To Risk:  None    Suicide Risk:  Minimal: No identifiable suicidal ideation.  Patients presenting with no risk factors but with morbid ruminations; may be classified as minimal risk based on the severity of the depressive symptoms   Plan Of Care/Follow-up recommendations:  Activity:  As tolerated Diet:  Heart healthy    Discharge Instructions     For your behavioral health needs you are advised to follow up with [facility].  New patients are seen at their walk-in clinic.  Walk-in hours are Monday - Friday from 8:30 am - 12:00 pm, and from 1:00 pm -  2:30 pm.  Walk-in patients are seen on a first come, first served basis, so try to arrive as early as possible for the best chance of being seen the same day:       [facility], [place] [address]      [phone]    Disposition: Patient psychiatrically cleared No evidence of imminent risk to self or others at present.   Patient does not meet criteria for psychiatric inpatient admission. Supportive therapy provided about ongoing stressors. Refer to IOP. Discussed crisis plan, support from social network, calling 911, coming to the Emergency Department, and calling Suicide Hotline.    Shuvon Rankin, NP 09/29/2019, 1:59 PM Attest to NP Note

## 2019-09-29 NOTE — Progress Notes (Signed)
   Pt is black male of 33 years,  Denies SI , HI and AVH today   Patient alert and oriented X 4.Patient presents in an calm state . Pain rated pain at 2 of 10 located all over body.  States he slept good over night with medication. Patient  endorses depression 4 of 10and anxiety at5of10.  Scheduled medications administered to patient, per MD orders.  Vital signs monitored.  Support and encouragement provided.  Routine safety checks conducted every 15 minutes.  Patient agreed to notify staff with problems or concerns.   Patient compliant with medications and treatment plan. Patient receptive, calm, and cooperative. Patient interacts well with others on the unit.  Patient remains safe at this time    Israel K. Melvyn Neth MSN, RN, [facility] [phone]

## 2019-09-29 NOTE — Progress Notes (Signed)
[place] NOVEL CORONAVIRUS (COVID-19) DAILY CHECK-OFF SYMPTOMS - answer yes or no to each - every day NO YES  Have you had a fever in the past 24 hours?  . Fever (Temp > 37.80C / 100F) X   Have you had any of these symptoms in the past 24 hours? . New Cough .  Sore Throat  .  Shortness of Breath .  Difficulty Breathing .  Unexplained Body Aches   X   Have you had any one of these symptoms in the past 24 hours not related to allergies?   . Runny Nose .  Nasal Congestion .  Sneezing   X   If you have had runny nose, nasal congestion, sneezing in the past 24 hours, has it worsened?  X   EXPOSURES - check yes or no X   Have you traveled outside the state in the past 14 days?  X   Have you been in contact with someone with a confirmed diagnosis of COVID-19 or PUI in the past 14 days without wearing appropriate PPE?  X   Have you been living in the same home as a person with confirmed diagnosis of COVID-19 or a PUI (household contact)?    X   Have you been diagnosed with COVID-19?    X              What to do next: Answered NO to all: Answered YES to anything:   Proceed with unit schedule Follow the [facility] Inpatient Flowsheet.   

## 2019-09-29 NOTE — Discharge Instructions (Signed)
For your behavioral health needs you are advised to follow up with [facility].  New patients are seen at their walk-in clinic.  Walk-in hours are Monday - Friday from 8:30 am - 12:00 pm, and from 1:00 pm - 2:30 pm.  Walk-in patients are seen on a first come, first served basis, so try to arrive as early as possible for the best chance of being seen the same day:       [facility]      [address]      [place], [place] [address]      [phone] 

## 2019-09-29 NOTE — BH Assessment (Signed)
[facility]HH Assessment Progress Note  Per Nehemiah Massed, MD, this pt does not require psychiatric hospitalization at this time.  Pt presents under IVC initiated by pt's health crisis counselor and upheld by EDP Meridee Score, MD, which Dr Jama Flavors has rescinded.  Pt is to be discharged from the [facility] Observation Unit with recommendation to follow up with [facility] [place].  This has been included in pt's discharge instructions.  Pt reportedly presents under a Horticulturist, commercial to [employer].  Pt's nurse has been notified, with recommendation to contact Security to facilitate pt being turned over to the custody of law enforcement.  Doylene Canning, MA Triage Specialist [phone]

## 2019-09-29 NOTE — Progress Notes (Signed)
Patient ID: Tristan [name], male   DOB: 01-Oct-1985, 34 y.o.   MRN: [id number] DAR Note:  Patient reclusive to his room earlier in the shift, denied SI/HI/AVH, and was medicated with his scheduled dose of Seroquel.  Pt is currently in bed, seems to be sleeping with no signs of distress noted. Will continue Q15 minute checks.

## 2019-10-05 NOTE — H&P (Signed)
Patient was discharged 10/02/19.  Discharge summary was completed by Dr. Jama Flavors.  Please see his note.  This provider put in discharge orders only.

## 2019-10-10 ENCOUNTER — Other Ambulatory Visit: Payer: Self-pay

## 2019-10-10 ENCOUNTER — Emergency Department (HOSPITAL_COMMUNITY)
Admission: EM | Admit: 2019-10-10 | Discharge: 2019-10-11 | Disposition: A | Discharge status: Home / Self Care | Payer: Self-pay | Attending: Emergency Medicine | Admitting: Emergency Medicine

## 2019-10-10 DIAGNOSIS — Z800 Family history of malignant neoplasm of digestive organs: Secondary | ICD-10-CM | POA: Insufficient documentation

## 2019-10-10 DIAGNOSIS — Z79899 Other long term (current) drug therapy: Secondary | ICD-10-CM | POA: Insufficient documentation

## 2019-10-10 DIAGNOSIS — R1032 Left lower quadrant pain: Secondary | ICD-10-CM | POA: Insufficient documentation

## 2019-10-10 DIAGNOSIS — I10 Essential (primary) hypertension: Secondary | ICD-10-CM | POA: Insufficient documentation

## 2019-10-10 DIAGNOSIS — K625 Hemorrhage of anus and rectum: Secondary | ICD-10-CM

## 2019-10-10 DIAGNOSIS — R509 Fever, unspecified: Secondary | ICD-10-CM | POA: Insufficient documentation

## 2019-10-10 DIAGNOSIS — R109 Unspecified abdominal pain: Secondary | ICD-10-CM

## 2019-10-10 DIAGNOSIS — F17210 Nicotine dependence, cigarettes, uncomplicated: Secondary | ICD-10-CM | POA: Insufficient documentation

## 2019-10-10 DIAGNOSIS — R112 Nausea with vomiting, unspecified: Secondary | ICD-10-CM | POA: Insufficient documentation

## 2019-10-10 DIAGNOSIS — R1012 Left upper quadrant pain: Secondary | ICD-10-CM | POA: Insufficient documentation

## 2019-10-10 LAB — COMPREHENSIVE METABOLIC PANEL
ALT: 17 U/L (ref 0–44)
AST: 26 U/L (ref 15–41)
Albumin: 4 g/dL (ref 3.5–5.0)
Alkaline Phosphatase: 55 U/L (ref 38–126)
Anion gap: 10 (ref 5–15)
BUN: 13 mg/dL (ref 6–20)
CO2: 23 mmol/L (ref 22–32)
Calcium: 9.3 mg/dL (ref 8.9–10.3)
Chloride: 102 mmol/L (ref 98–111)
Creatinine, Ser: 0.98 mg/dL (ref 0.61–1.24)
GFR calc Af Amer: >60 mL/min (ref >60)
GFR calc non Af Amer: >60 mL/min (ref >60)
Glucose, Bld: 100 mg/dL — ABNORMAL HIGH (ref 70–99)
Potassium: 4.1 mmol/L (ref 3.5–5.1)
Sodium: 135 mmol/L (ref 135–145)
Total Bilirubin: 0.7 mg/dL (ref 0.3–1.2)
Total Protein: 7.2 g/dL (ref 6.5–8.1)

## 2019-10-10 LAB — CBC
HCT: 38.8 % — ABNORMAL LOW (ref 39.0–52.0)
Hemoglobin: 13.7 g/dL (ref 13.0–17.0)
MCH: 27.5 pg (ref 26.0–34.0)
MCHC: 35.3 g/dL (ref 30.0–36.0)
MCV: 77.9 fL — ABNORMAL LOW (ref 80.0–100.0)
Platelets: 399 10*3/uL (ref 150–400)
RBC: 4.98 MIL/uL (ref 4.22–5.81)
RDW: 14.6 % (ref 11.5–15.5)
WBC: 8.6 10*3/uL (ref 4.0–10.5)
nRBC: 0 % (ref 0.0–0.2)

## 2019-10-10 LAB — LIPASE, BLOOD: Lipase: 17 U/L (ref 11–51)

## 2019-10-10 NOTE — ED Triage Notes (Signed)
Pt arrives via POV with c/o of LLQ abdominal pain and blood in stool. Patient unable to verify the amount of blood in stool, but states that it has been ongoing for about 1 week.

## 2019-10-10 NOTE — ED Notes (Signed)
Called patient to [place]. Unable to locate at this time.

## 2019-10-10 NOTE — ED Notes (Signed)
Patient found sleeping in waiting room.

## 2019-10-11 LAB — URINALYSIS, ROUTINE W REFLEX MICROSCOPIC
Bacteria, UA: NONE SEEN
Bilirubin Urine: NEGATIVE
Glucose, UA: NEGATIVE mg/dL
Hgb urine dipstick: NEGATIVE
Ketones, ur: NEGATIVE mg/dL
Leukocytes,Ua: NEGATIVE
Nitrite: NEGATIVE
Protein, ur: 30 mg/dL — AB
Specific Gravity, Urine: 1.027 (ref 1.005–1.030)
pH: 6 (ref 5.0–8.0)

## 2019-10-11 LAB — POC OCCULT BLOOD, ED: Fecal Occult Bld: NEGATIVE

## 2019-10-11 MED ORDER — DICYCLOMINE HCL 20 MG PO TABS: 20.0000 mg | ORAL_TABLET | Freq: Two times a day (BID) | ORAL | 0 refills | Status: DC

## 2019-10-11 NOTE — ED Notes (Signed)
Pt escorted out with security and [employer]

## 2019-10-11 NOTE — ED Provider Notes (Signed)
[facility] - Inpatient EMERGENCY DEPARTMENT Provider Note   CSN: [id number] Arrival date & time: 10/10/19  2003     History Chief Complaint  Patient presents with  . Abdominal Pain  . Bloody Stools    Tristan [name] is a 34 y.o. male.  Patient to ED with complaint of left sided abdominal pain for the past several days. Tristan [name] report nausea with 1-2 episodes of emesis daily that is nonbloody, however, reports seeing bright red BPR for the same several days. Tristan [name] reports subjective fever as "sometimes I break out in a sweat". Tristan [name] has been eating and drinking, denies alcohol use, and reports that oral intake does not make his pain better or worse. Tristan [name] also reports chest pain, decreased urination, SOB. Tristan [name] expresses concern that his father has been diagnosed and treated for colon cancer.   The history is provided by the patient. No language interpreter was used.  Abdominal Pain Associated symptoms: chest pain, cough, fever ("sweating"), nausea, shortness of breath and vomiting        Past Medical History:  Diagnosis Date  . Bipolar 1 disorder (HCC)   . Hypertension   . PTSD (post-traumatic stress disorder)     Patient Active Problem List   Diagnosis Date Noted  . Substance induced mood disorder (HCC) 09/29/2019  . MDD (major depressive disorder), recurrent episode, severe (HCC) 02/20/2019  . Polysubstance abuse (HCC) 01/29/2019  . MDD (major depressive disorder), severe (HCC) 01/27/2019  . Suicide attempt (HCC)   . Bipolar 1 disorder, mixed (HCC) 05/02/2014    Past Surgical History:  Procedure Laterality Date  . NO PAST SURGERIES         Family History  Problem Relation Age of Onset  . Other Father   . Psychiatric Illness Father     Social History   Tobacco Use  . Smoking status: Current Every Day Smoker    Packs/day: 0.50  . Smokeless tobacco: Never Used  Substance Use Topics  . Alcohol use: Never  . Drug use: Never    Types: Marijuana, Heroin    Comment: denies drug use    Home Medications Prior to Admission medications   Medication Sig Start Date End Date Taking? Authorizing Provider  FLUoxetine (PROZAC) 10 MG capsule Take 1 capsule (10 mg total) by mouth daily. 02/23/19   Money, Gerlene Burdock, FNP  hydrOXYzine (ATARAX/VISTARIL) 25 MG tablet Take 1 tablet (25 mg total) by mouth every 6 (six) hours as needed for anxiety. 02/23/19   Money, Gerlene Burdock, FNP  QUEtiapine (SEROQUEL) 50 MG tablet Take 1 tablet (50 mg total) by mouth at bedtime. 02/23/19   Money, Gerlene Burdock, FNP    Allergies    Tomato  Review of Systems   Review of Systems  Constitutional: Positive for fever ("sweating"). Negative for appetite change.  HENT: Negative.   Respiratory: Positive for cough and shortness of breath.   Cardiovascular: Positive for chest pain.  Gastrointestinal: Positive for abdominal pain, blood in stool, nausea and vomiting.  Genitourinary: Positive for decreased urine volume.  Musculoskeletal: Positive for myalgias.  Skin: Negative.   Neurological: Negative.     Physical Exam Updated Vital Signs BP (!) 115/96 (BP Location: Left Arm)   Pulse 72   Temp 97.7 F (36.5 C) (Oral)   Resp 20   Ht 5\' 10"  (1.778 m)   SpO2 99%   BMI 25.25 kg/m   Physical Exam Vitals and nursing note reviewed.  Constitutional:      Appearance: Tristan [name]  is well-developed.  HENT:     Head: Normocephalic.  Cardiovascular:     Rate and Rhythm: Normal rate and regular rhythm.     Heart sounds: No murmur.  Pulmonary:     Effort: Pulmonary effort is normal.     Breath sounds: Normal breath sounds. No wheezing, rhonchi or rales.  Chest:     Chest wall: No tenderness.  Abdominal:     General: Bowel sounds are normal. There is no distension.     Palpations: Abdomen is soft.     Tenderness: There is abdominal tenderness in the left upper quadrant and left lower quadrant. There is no guarding or rebound.     Hernia: No hernia is present.  Genitourinary:    Rectum: Guaiac  result negative. No tenderness.  Musculoskeletal:        General: Normal range of motion.     Cervical back: Normal range of motion and neck supple.  Skin:    General: Skin is warm and dry.     Findings: No rash.  Neurological:     Mental Status: Tristan [name] is alert and oriented to person, place, and time.     ED Results / Procedures / Treatments   Labs (all labs ordered are listed, but only abnormal results are displayed) Labs Reviewed  COMPREHENSIVE METABOLIC PANEL - Abnormal; Notable for the following components:      Result Value   Glucose, Bld 100 (*)    All other components within normal limits  CBC - Abnormal; Notable for the following components:   HCT 38.8 (*)    MCV 77.9 (*)    All other components within normal limits  URINALYSIS, ROUTINE W REFLEX MICROSCOPIC - Abnormal; Notable for the following components:   Protein, ur 30 (*)    All other components within normal limits  LIPASE, BLOOD  POC OCCULT BLOOD, ED   Results for orders placed or performed during the hospital encounter of 10/10/19  Lipase, blood  Result Value Ref Range   Lipase 17 11 - 51 U/L  Comprehensive metabolic panel  Result Value Ref Range   Sodium 135 135 - 145 mmol/L   Potassium 4.1 3.5 - 5.1 mmol/L   Chloride 102 98 - 111 mmol/L   CO2 23 22 - 32 mmol/L   Glucose, Bld 100 (H) 70 - 99 mg/dL   BUN 13 6 - 20 mg/dL   Creatinine, Ser 5.28 0.61 - 1.24 mg/dL   Calcium 9.3 8.9 - 41.3 mg/dL   Total Protein 7.2 6.5 - 8.1 g/dL   Albumin 4.0 3.5 - 5.0 g/dL   AST 26 15 - 41 U/L   ALT 17 0 - 44 U/L   Alkaline Phosphatase 55 38 - 126 U/L   Total Bilirubin 0.7 0.3 - 1.2 mg/dL   GFR calc non Af Amer >60 >60 mL/min   GFR calc Af Amer >60 >60 mL/min   Anion gap 10 5 - 15  CBC  Result Value Ref Range   WBC 8.6 4.0 - 10.5 K/uL   RBC 4.98 4.22 - 5.81 MIL/uL   Hemoglobin 13.7 13.0 - 17.0 g/dL   HCT 24.4 (L) 01.0 - 27.2 %   MCV 77.9 (L) 80.0 - 100.0 fL   MCH 27.5 26.0 - 34.0 pg   MCHC 35.3 30.0 - 36.0 g/dL     RDW 53.6 64.4 - 03.4 %   Platelets 399 150 - 400 K/uL   nRBC 0.0 0.0 - 0.2 %  Urinalysis, Routine  w reflex microscopic  Result Value Ref Range   Color, Urine YELLOW YELLOW   APPearance CLEAR CLEAR   Specific Gravity, Urine 1.027 1.005 - 1.030   pH 6.0 5.0 - 8.0   Glucose, UA NEGATIVE NEGATIVE mg/dL   Hgb urine dipstick NEGATIVE NEGATIVE   Bilirubin Urine NEGATIVE NEGATIVE   Ketones, ur NEGATIVE NEGATIVE mg/dL   Protein, ur 30 (A) NEGATIVE mg/dL   Nitrite NEGATIVE NEGATIVE   Leukocytes,Ua NEGATIVE NEGATIVE   RBC / HPF 0-5 0 - 5 RBC/hpf   WBC, UA 0-5 0 - 5 WBC/hpf   Bacteria, UA NONE SEEN NONE SEEN   Squamous Epithelial / LPF 0-5 0 - 5   Mucus PRESENT    Hyaline Casts, UA PRESENT   POC occult blood, ED Provider will collect  Result Value Ref Range   Fecal Occult Bld NEGATIVE NEGATIVE    EKG None  Radiology No results found.  Procedures Procedures (including critical care time)  Medications Ordered in ED Medications - No data to display  ED Course  I have reviewed the triage vital signs and the nursing notes.  Pertinent labs & imaging results that were available during my care of the patient were reviewed by me and considered in my medical decision making (see chart for details).    MDM Rules/Calculators/A&P                      Patient to ED with multiple complaints, initially complaining of left sided abdominal pain, has a general positive ROS.   Tristan [name] is well appearing. In NAD. VSS. No hypotension, tachycardia to suggest volume loss. Normal hgb, guaiac negative stool. Abdominal exam is essentially benign. No cough or fever to suggest URI/PNA. Doubt COVID.  Tristan [name] is felt appropriate for discharge and is strongly encouraged to become established with a primary care provider. Referrals provided for this as well as referral to GI for further evaluation of reported rectal bleeding, positive family history of colon CA.   Will provide Rx Bentyl for abdominal pain to trial  while waiting for PCP appointment. Return precautions discussed.   Final Clinical Impression(s) / ED Diagnoses Final diagnoses:  None   1. Abdominal pain 2. History rectal bleeding  Rx / DC Orders ED Discharge Orders    None       Charlann Lange, Hershal Coria 10/11/19 1950    Noemi Chapel, MD 10/12/19 [phone]

## 2019-10-11 NOTE — ED Notes (Signed)
Pt upset and inquiring about the reason we are discharging.

## 2019-10-11 NOTE — Discharge Instructions (Addendum)
Please follow up with [facility] or with a primary provider of your choice for ongoing medical care. Take Bentyl for abdominal pain until seen in the office for recheck.   Please make an appointment with [place] [facility] office for further evaluation of rectal bleeding.

## 2019-10-16 ENCOUNTER — Encounter (HOSPITAL_COMMUNITY): Payer: Self-pay

## 2019-10-16 ENCOUNTER — Emergency Department (HOSPITAL_COMMUNITY)
Admission: EM | Admit: 2019-10-16 | Discharge: 2019-10-16 | Disposition: A | Discharge status: Home / Self Care | Payer: Self-pay | Attending: Emergency Medicine | Admitting: Emergency Medicine

## 2019-10-16 ENCOUNTER — Emergency Department (HOSPITAL_COMMUNITY): Payer: Self-pay

## 2019-10-16 ENCOUNTER — Other Ambulatory Visit: Payer: Self-pay

## 2019-10-16 DIAGNOSIS — F17210 Nicotine dependence, cigarettes, uncomplicated: Secondary | ICD-10-CM | POA: Insufficient documentation

## 2019-10-16 DIAGNOSIS — Z79899 Other long term (current) drug therapy: Secondary | ICD-10-CM | POA: Insufficient documentation

## 2019-10-16 DIAGNOSIS — F1910 Other psychoactive substance abuse, uncomplicated: Secondary | ICD-10-CM | POA: Insufficient documentation

## 2019-10-16 DIAGNOSIS — I10 Essential (primary) hypertension: Secondary | ICD-10-CM | POA: Insufficient documentation

## 2019-10-16 DIAGNOSIS — R400 Somnolence: Secondary | ICD-10-CM | POA: Insufficient documentation

## 2019-10-16 LAB — I-STAT CHEM 8, ED
BUN: 14 mg/dL (ref 6–20)
Calcium, Ion: 1.13 mmol/L — ABNORMAL LOW (ref 1.15–1.40)
Chloride: 102 mmol/L (ref 98–111)
Creatinine, Ser: 1 mg/dL (ref 0.61–1.24)
Glucose, Bld: 89 mg/dL (ref 70–99)
HCT: 37 % — ABNORMAL LOW (ref 39.0–52.0)
Hemoglobin: 12.6 g/dL — ABNORMAL LOW (ref 13.0–17.0)
Potassium: 3.7 mmol/L (ref 3.5–5.1)
Sodium: 138 mmol/L (ref 135–145)
TCO2: 27 mmol/L (ref 22–32)

## 2019-10-16 LAB — ETHANOL: Alcohol, Ethyl (B): <10 mg/dL (ref <10)

## 2019-10-16 NOTE — ED Triage Notes (Signed)
He was seen to be "sleepy and acting strangely" at a local motel described by the paramedics as "a hotbed for heroin users". He arrives drowsy with pinpoint pupils to our E.D. his skin is normal, warm and dry and he is (thus far) breathing normally. He utters very short responses to our questions and repeatedly states "Con't take me to jail". We assure him he is safe and in the hospital.

## 2019-10-16 NOTE — ED Notes (Signed)
He is more awake and asks respetetively "I have a question", but then his speech trails off.

## 2019-10-16 NOTE — ED Notes (Signed)
He continues to breath normally. He is comfortably sleeping in the presence of his sitter.

## 2019-10-16 NOTE — ED Notes (Signed)
Pt ambulated to restroom. Pt was unsteady on his feet and a little confused. Pt was returned to his bed and given a sandwich.

## 2019-10-16 NOTE — ED Notes (Signed)
Signature pad not working, will restart the computer

## 2019-10-16 NOTE — ED Provider Notes (Signed)
[facility]-EMERGENCY DEPT Provider Note   CSN: [id number] Arrival date & time: 10/16/19  1651     History Chief Complaint  Patient presents with  . Drug Overdose    Tristan [name] is a 34 y.o. male.  34 year old male presents from a hotel with possible drug ingestion.  According to EMS, patient was in an area that is known for drug addiction.  Patient was found to be very sleepy and wandering the halls.  Possible fall though no one is quite sure.  Upon EMS arrival, blood sugar above 100.  Patient was noted to have pinpoint pupils.  Was protecting his airway therefore Narcan was not given.  Patient can only say do not take me to jail        Past Medical History:  Diagnosis Date  . Bipolar 1 disorder (HCC)   . Hypertension   . PTSD (post-traumatic stress disorder)     Patient Active Problem List   Diagnosis Date Noted  . Substance induced mood disorder (HCC) 09/29/2019  . MDD (major depressive disorder), recurrent episode, severe (HCC) 02/20/2019  . Polysubstance abuse (HCC) 01/29/2019  . MDD (major depressive disorder), severe (HCC) 01/27/2019  . Suicide attempt (HCC)   . Bipolar 1 disorder, mixed (HCC) 05/02/2014    Past Surgical History:  Procedure Laterality Date  . NO PAST SURGERIES         Family History  Problem Relation Age of Onset  . Other Father   . Psychiatric Illness Father     Social History   Tobacco Use  . Smoking status: Current Every Day Smoker    Packs/day: 0.50  . Smokeless tobacco: Never Used  Substance Use Topics  . Alcohol use: Never  . Drug use: Never    Types: Marijuana, Heroin    Comment: denies drug use    Home Medications Prior to Admission medications   Medication Sig Start Date End Date Taking? Authorizing Provider  dicyclomine (BENTYL) 20 MG tablet Take 1 tablet (20 mg total) by mouth 2 (two) times daily. 10/11/19   Elpidio Anis, PA-C  FLUoxetine (PROZAC) 10 MG capsule Take 1 capsule  (10 mg total) by mouth daily. 02/23/19   Money, Gerlene Burdock, FNP  hydrOXYzine (ATARAX/VISTARIL) 25 MG tablet Take 1 tablet (25 mg total) by mouth every 6 (six) hours as needed for anxiety. 02/23/19   Money, Gerlene Burdock, FNP  QUEtiapine (SEROQUEL) 50 MG tablet Take 1 tablet (50 mg total) by mouth at bedtime. 02/23/19   Money, Gerlene Burdock, FNP    Allergies    Tomato  Review of Systems   Review of Systems  Unable to perform ROS: Mental status change    Physical Exam Updated Vital Signs BP (!) 145/108 (BP Location: Left Arm)   Pulse (!) 105   Resp 17   SpO2 99%   Physical Exam Vitals and nursing note reviewed.  Constitutional:      General: He is not in acute distress.    Appearance: Normal appearance. He is well-developed. He is not toxic-appearing.  HENT:     Head: Normocephalic and atraumatic.  Eyes:     General: Lids are normal.     Conjunctiva/sclera: Conjunctivae normal.     Pupils: Pupils are equal, round, and reactive to light.  Neck:     Thyroid: No thyroid mass.     Trachea: No tracheal deviation.  Cardiovascular:     Rate and Rhythm: Normal rate and regular rhythm.  Heart sounds: Normal heart sounds. No murmur. No gallop.   Pulmonary:     Effort: Pulmonary effort is normal. No respiratory distress.     Breath sounds: Normal breath sounds. No stridor. No decreased breath sounds, wheezing, rhonchi or rales.  Abdominal:     General: Bowel sounds are normal. There is no distension.     Palpations: Abdomen is soft.     Tenderness: There is no abdominal tenderness. There is no rebound.  Musculoskeletal:        General: No tenderness. Normal range of motion.     Cervical back: Normal range of motion and neck supple.  Skin:    General: Skin is warm and dry.     Findings: No abrasion or rash.  Neurological:     Mental Status: He is alert and oriented to person, place, and time.     GCS: GCS eye subscore is 4. GCS verbal subscore is 5. GCS motor subscore is 6.     Cranial  Nerves: No cranial nerve deficit.     Sensory: No sensory deficit.     Motor: No tremor.     Comments: Patient moves all four extremities.  Psychiatric:        Attention and Perception: He is inattentive.        Speech: Speech is delayed.        Behavior: Behavior is withdrawn.     ED Results / Procedures / Treatments   Labs (all labs ordered are listed, but only abnormal results are displayed) Labs Reviewed  RAPID URINE DRUG SCREEN, HOSP PERFORMED  ETHANOL  I-STAT CHEM 8, ED    EKG None  Radiology No results found.  Procedures Procedures (including critical care time)  Medications Ordered in ED Medications - No data to display  ED Course  I have reviewed the triage vital signs and the nursing notes.  Pertinent labs & imaging results that were available during my care of the patient were reviewed by me and considered in my medical decision making (see chart for details).    MDM Rules/Calculators/A&P                      Patient admits to use opiates this evening.  He has been allowed to sober up.  Head and neck CT without acute findings.  Lecture lites stable.  Alcohol level negative.  Stable for discharge Final Clinical Impression(s) / ED Diagnoses Final diagnoses:  None    Rx / DC Orders ED Discharge Orders    None       Lacretia Leigh, MD 10/16/19 2110

## 2019-11-02 ENCOUNTER — Encounter (HOSPITAL_COMMUNITY): Payer: Self-pay | Admitting: Emergency Medicine

## 2019-11-02 ENCOUNTER — Emergency Department (HOSPITAL_COMMUNITY)
Admission: EM | Admit: 2019-11-02 | Discharge: 2019-11-02 | Disposition: A | Discharge status: Home / Self Care | Payer: Self-pay | Attending: Emergency Medicine | Admitting: Emergency Medicine

## 2019-11-02 ENCOUNTER — Other Ambulatory Visit: Payer: Self-pay

## 2019-11-02 DIAGNOSIS — F319 Bipolar disorder, unspecified: Secondary | ICD-10-CM | POA: Insufficient documentation

## 2019-11-02 DIAGNOSIS — I10 Essential (primary) hypertension: Secondary | ICD-10-CM | POA: Insufficient documentation

## 2019-11-02 DIAGNOSIS — T50901A Poisoning by unspecified drugs, medicaments and biological substances, accidental (unintentional), initial encounter: Secondary | ICD-10-CM | POA: Insufficient documentation

## 2019-11-02 DIAGNOSIS — F4310 Post-traumatic stress disorder, unspecified: Secondary | ICD-10-CM | POA: Insufficient documentation

## 2019-11-02 DIAGNOSIS — Y901 Blood alcohol level of 20-39 mg/100 ml: Secondary | ICD-10-CM | POA: Insufficient documentation

## 2019-11-02 DIAGNOSIS — T6591XA Toxic effect of unspecified substance, accidental (unintentional), initial encounter: Secondary | ICD-10-CM

## 2019-11-02 DIAGNOSIS — F17210 Nicotine dependence, cigarettes, uncomplicated: Secondary | ICD-10-CM | POA: Insufficient documentation

## 2019-11-02 DIAGNOSIS — F10929 Alcohol use, unspecified with intoxication, unspecified: Secondary | ICD-10-CM | POA: Insufficient documentation

## 2019-11-02 LAB — CBC WITH DIFFERENTIAL/PLATELET
Abs Immature Granulocytes: 0.01 10*3/uL (ref 0.00–0.07)
Basophils Absolute: 0 10*3/uL (ref 0.0–0.1)
Basophils Relative: 1 %
Eosinophils Absolute: 0.1 10*3/uL (ref 0.0–0.5)
Eosinophils Relative: 2 %
HCT: 38.3 % — ABNORMAL LOW (ref 39.0–52.0)
Hemoglobin: 13.1 g/dL (ref 13.0–17.0)
Immature Granulocytes: 0 %
Lymphocytes Relative: 33 %
Lymphs Abs: 1.9 10*3/uL (ref 0.7–4.0)
MCH: 27 pg (ref 26.0–34.0)
MCHC: 34.2 g/dL (ref 30.0–36.0)
MCV: 79 fL — ABNORMAL LOW (ref 80.0–100.0)
Monocytes Absolute: 0.4 10*3/uL (ref 0.1–1.0)
Monocytes Relative: 6 %
Neutro Abs: 3.2 10*3/uL (ref 1.7–7.7)
Neutrophils Relative %: 58 %
Platelets: 350 10*3/uL (ref 150–400)
RBC: 4.85 MIL/uL (ref 4.22–5.81)
RDW: 14.5 % (ref 11.5–15.5)
WBC: 5.6 10*3/uL (ref 4.0–10.5)
nRBC: 0 % (ref 0.0–0.2)

## 2019-11-02 LAB — BASIC METABOLIC PANEL
Anion gap: 9 (ref 5–15)
BUN: 13 mg/dL (ref 6–20)
CO2: 22 mmol/L (ref 22–32)
Calcium: 8.9 mg/dL (ref 8.9–10.3)
Chloride: 107 mmol/L (ref 98–111)
Creatinine, Ser: 0.92 mg/dL (ref 0.61–1.24)
GFR calc Af Amer: >60 mL/min (ref >60)
GFR calc non Af Amer: >60 mL/min (ref >60)
Glucose, Bld: 86 mg/dL (ref 70–99)
Potassium: 3.9 mmol/L (ref 3.5–5.1)
Sodium: 138 mmol/L (ref 135–145)

## 2019-11-02 LAB — ETHANOL: Alcohol, Ethyl (B): 27 mg/dL — ABNORMAL HIGH (ref <10)

## 2019-11-02 LAB — CBG MONITORING, ED: Glucose-Capillary: 77 mg/dL (ref 70–99)

## 2019-11-02 MED ORDER — ONDANSETRON HCL 4 MG/2ML IJ SOLN
4.0000 mg | Freq: Once | INTRAMUSCULAR | Status: AC
  Administered 2019-11-02: 4 mg via INTRAVENOUS
  Filled 2019-11-02: qty 2

## 2019-11-02 MED ORDER — LACTATED RINGERS IV BOLUS
1000.0000 mL | Freq: Once | INTRAVENOUS | Status: AC
  Administered 2019-11-02: 1000 mL via INTRAVENOUS

## 2019-11-02 NOTE — ED Triage Notes (Signed)
Patient here via EMS from hotel. Found in hallway. States that he consumed "a unknown drink, unknown amount". Now has pain all over body.

## 2019-11-02 NOTE — ED Provider Notes (Signed)
Patient was able to walk to the bathroom without difficulty.  Now much more sober.  Appears safe for discharge   Gwyneth Sprout, MD 11/02/19 [phone]

## 2019-11-02 NOTE — ED Provider Notes (Signed)
Emergency Department Provider Note   I have reviewed the triage vital signs and the nursing notes.   HISTORY  Chief Complaint Ingestion   HPI Mayford Alberg is a 34 y.o. male who presents the emergency department today apparently secondary to altered mental status.  Patient will awake and answer yes/no questions and repeat that he 30 told me a history however he will not give me history.    No other associated or modifying symptoms.   Level  V Caveat 2/2 Altered Mental Status  Past Medical History:  Diagnosis Date  . Bipolar 1 disorder ([place])   . Hypertension   . PTSD (post-traumatic stress disorder)     Patient Active Problem List   Diagnosis Date Noted  . Substance induced mood disorder ([place]) 09/29/2019  . MDD (major depressive disorder), recurrent episode, severe ([place]) 02/20/2019  . Polysubstance abuse ([place]) 01/29/2019  . MDD (major depressive disorder), severe ([place]) 01/27/2019  . Suicide attempt ([place])   . Bipolar 1 disorder, mixed ([place]) 05/02/2014    Past Surgical History:  Procedure Laterality Date  . NO PAST SURGERIES      Current Outpatient Rx  . Order #: [id number] Class: Normal  . Order #: [id number] Class: Normal  . Order #: [id number] Class: Normal  . Order #: [id number] Class: Normal    Allergies Tomato  Family History  Problem Relation Age of Onset  . Other Father   . Psychiatric Illness Father     Social History Social History   Tobacco Use  . Smoking status: Current Every Day Smoker    Packs/day: 0.50  . Smokeless tobacco: Never Used  Substance Use Topics  . Alcohol use: Never  . Drug use: Never    Types: Marijuana, Heroin    Comment: denies drug use    Review of Systems  Level  V Caveat 2/2 Altered Mental Status ____________________________________________   PHYSICAL EXAM:  VITAL SIGNS: ED Triage Vitals  Enc Vitals Group     BP 11/02/19 0119 113/66     Pulse Rate 11/02/19 0119 74     Resp 11/02/19 0119 18   Temp 11/02/19 0119 (!) 97.5 F (36.4 C)     Temp Source 11/02/19 0119 Oral     SpO2 11/02/19 0119 100 %    Constitutional: sleepy, answers questions with yes/no but doesn't elaborate or answer orienting questions. Well appearing and in no acute distress. Eyes: Conjunctivae are normal. PERRL. EOMI. Head: Atraumatic. Nose: No congestion/rhinnorhea. Mouth/Throat: Mucous membranes are moist.  Oropharynx non-erythematous. Neck: No stridor.  No meningeal signs.   Cardiovascular: Normal rate, regular rhythm. Good peripheral circulation. Grossly normal heart sounds.   Respiratory: Normal respiratory effort.  No retractions. Lungs CTAB. Gastrointestinal: Soft and nontender. No distention.  Musculoskeletal: No lower extremity tenderness nor edema. No gross deformities of extremities. Neurologic:  Normal speech and language. No gross focal neurologic deficits are appreciated.  Skin:  Skin is warm, dry and intact. No rash noted.   ____________________________________________   LABS (all labs ordered are listed, but only abnormal results are displayed)  Labs Reviewed  CBC WITH DIFFERENTIAL/PLATELET - Abnormal; Notable for the following components:      Result Value   HCT 38.3 (*)    MCV 79.0 (*)    All other components within normal limits  ETHANOL - Abnormal; Notable for the following components:   Alcohol, Ethyl (B) 27 (*)    All other components within normal limits  BASIC METABOLIC PANEL  RAPID URINE DRUG SCREEN, HOSP PERFORMED  CBG MONITORING, ED   ____________________________________________  EKG   EKG Interpretation  Date/Time:  Tuesday November 02 2019 04:44:52 EST Ventricular Rate:  61 PR Interval:    QRS Duration: 100 QT Interval:  420 QTC Calculation: 423 R Axis:   89 Text Interpretation: Sinus rhythm ST elev, probable normal early repol pattern improved rate from previous Confirmed by Marily Memos [phone]) on 11/02/2019 6:18:26 AM        ____________________________________________  RADIOLOGY  No results found.  ____________________________________________   PROCEDURES  Procedure(s) performed:   Procedures   ____________________________________________   INITIAL IMPRESSION / ASSESSMENT AND PLAN / ED COURSE  Mental status is improving but still very sleepy. States he 'drank a cup of something' that was not alcohol. Doesn't know what was in it but thinks that is what makes him so sleepy. Denies fall or trauma. Denies coingestions, has h/o opiates but not last night. Is feeling nauseous now. Still not clinically sober enough for discharge. Will give some fluids and zofran.   Care transferred pending reevaluation for clinical sobriety.    Pertinent labs & imaging results that were available during my care of the patient were reviewed by me and considered in my medical decision making (see chart for details). ____________________________________________  FINAL CLINICAL IMPRESSION(S) / ED DIAGNOSES  Final diagnoses:  Accidental ingestion of substance, initial encounter     MEDICATIONS GIVEN DURING THIS VISIT:  Medications  lactated ringers bolus 1,000 mL (1,000 mLs Intravenous New Bag/Given (Non-Interop) 11/02/19 1914)  ondansetron (ZOFRAN) injection 4 mg (4 mg Intravenous Given 11/02/19 0637)     NEW OUTPATIENT MEDICATIONS STARTED DURING THIS VISIT:  New Prescriptions   No medications on file    Note:  This note was prepared with assistance of Dragon voice recognition software. Occasional wrong-word or sound-a-like substitutions may have occurred due to the inherent limitations of voice recognition software.   Makaela Cando, Barbara Cower, MD 11/02/19 [phone]

## 2019-11-02 NOTE — ED Notes (Addendum)
Woke pt up to see how pt ambulates and to get a urine sample. Pt states that he is ready to leave and does not want to provide urine sample. Pt states "pull this thing out of my arm". When this writer went to put gloves on, pt ripped IV out of arm. Small amount of blood. Placed on gauze on pt's arm, and pt states "why are you holding this so tight?!" . Informed pt he could hold gauze on. Pt requested a sandwich and to leave. Pt provided sandwich, did not want to wait to speak to Dr Anitra Lauth. Pt ambulatory with steady gait, speaking clearly in full sentences. Pt did not want to sign for dc.

## 2019-11-02 NOTE — ED Notes (Signed)
When transfering pt to the lobby after triage, pt asked this writer why he was here and then stated that he wanted to leave. Pt was encouraged to stay, but demanded his shoes. Pt is A&O x4 at this time and in no apparent distress.

## 2019-11-11 ENCOUNTER — Other Ambulatory Visit: Payer: Self-pay | Admitting: *Deleted

## 2019-11-11 NOTE — Patient Outreach (Signed)
Telephone outreach. Referred due to multiple ED visits: ingestion of unknown substance, altered LOC, abdominal pain and c/o blood in stools, Suicidal.  No answer, left a messaged and requested a call back.  Will call again on Monday.  [name]. [name], MSN, [facility] Gerontological Nurse Practitioner [facility] Care Management [phone]

## 2019-11-15 ENCOUNTER — Encounter: Payer: Self-pay | Admitting: *Deleted

## 2019-11-15 ENCOUNTER — Other Ambulatory Visit: Payer: Self-pay | Admitting: *Deleted

## 2019-11-15 NOTE — Patient Outreach (Signed)
Second telephone outreach, was unsuccessful. Left message to return my call. Will send unsuccessful outreach letter.  [name]. [name], MSN, [facility] Gerontological Nurse Practitioner [facility] Care Management [phone]

## 2019-11-15 NOTE — Patient Outreach (Signed)
Mr. Brickley father by the same name called and reported his son is in the [facility] at this time. He took my name and number and will try to do a 3 way call with me, himself and his son. Mr. Copen would like help for his son in any way possible.  [name]. [name], MSN, [facility] Gerontological Nurse Practitioner [facility] Care Management [phone]

## 2019-11-23 ENCOUNTER — Encounter (HOSPITAL_COMMUNITY): Payer: Self-pay | Admitting: Emergency Medicine

## 2019-11-23 ENCOUNTER — Emergency Department (HOSPITAL_COMMUNITY)
Admission: EM | Admit: 2019-11-23 | Discharge: 2019-11-24 | Payer: 59 | Attending: Emergency Medicine | Admitting: Emergency Medicine

## 2019-11-23 ENCOUNTER — Other Ambulatory Visit: Payer: Self-pay

## 2019-11-23 DIAGNOSIS — I10 Essential (primary) hypertension: Secondary | ICD-10-CM | POA: Insufficient documentation

## 2019-11-23 DIAGNOSIS — Z79899 Other long term (current) drug therapy: Secondary | ICD-10-CM | POA: Insufficient documentation

## 2019-11-23 DIAGNOSIS — F1910 Other psychoactive substance abuse, uncomplicated: Secondary | ICD-10-CM | POA: Diagnosis not present

## 2019-11-23 DIAGNOSIS — F17200 Nicotine dependence, unspecified, uncomplicated: Secondary | ICD-10-CM | POA: Insufficient documentation

## 2019-11-23 DIAGNOSIS — R4182 Altered mental status, unspecified: Secondary | ICD-10-CM | POA: Diagnosis present

## 2019-11-23 NOTE — ED Triage Notes (Addendum)
Patient arrived with [employer] officer handcuffed from a local [place] station reports Cocaine intake this evening , unable to focus during encounter / somnolent . Respirations unlabored.

## 2019-11-23 NOTE — ED Notes (Signed)
Pt refuses to sit in wheelchair even after asked multiple times

## 2019-11-24 LAB — RAPID URINE DRUG SCREEN, HOSP PERFORMED
Amphetamines: POSITIVE — AB
Barbiturates: NOT DETECTED
Benzodiazepines: NOT DETECTED
Cocaine: POSITIVE — AB
Opiates: POSITIVE — AB
Tetrahydrocannabinol: POSITIVE — AB

## 2019-11-24 LAB — COMPREHENSIVE METABOLIC PANEL
ALT: 13 U/L (ref 0–44)
AST: 18 U/L (ref 15–41)
Albumin: 3.8 g/dL (ref 3.5–5.0)
Alkaline Phosphatase: 54 U/L (ref 38–126)
Anion gap: 8 (ref 5–15)
BUN: 15 mg/dL (ref 6–20)
CO2: 25 mmol/L (ref 22–32)
Calcium: 9 mg/dL (ref 8.9–10.3)
Chloride: 105 mmol/L (ref 98–111)
Creatinine, Ser: 1.27 mg/dL — ABNORMAL HIGH (ref 0.61–1.24)
GFR calc Af Amer: >60 mL/min (ref >60)
GFR calc non Af Amer: >60 mL/min (ref >60)
Glucose, Bld: 111 mg/dL — ABNORMAL HIGH (ref 70–99)
Potassium: 3.7 mmol/L (ref 3.5–5.1)
Sodium: 138 mmol/L (ref 135–145)
Total Bilirubin: 0.6 mg/dL (ref 0.3–1.2)
Total Protein: 6.9 g/dL (ref 6.5–8.1)

## 2019-11-24 LAB — ACETAMINOPHEN LEVEL: Acetaminophen (Tylenol), Serum: <10 ug/mL — ABNORMAL LOW (ref 10–30)

## 2019-11-24 LAB — CBC WITH DIFFERENTIAL/PLATELET
Abs Immature Granulocytes: 0.01 10*3/uL (ref 0.00–0.07)
Basophils Absolute: 0 10*3/uL (ref 0.0–0.1)
Basophils Relative: 1 %
Eosinophils Absolute: 0.1 10*3/uL (ref 0.0–0.5)
Eosinophils Relative: 1 %
HCT: 38.3 % — ABNORMAL LOW (ref 39.0–52.0)
Hemoglobin: 13.4 g/dL (ref 13.0–17.0)
Immature Granulocytes: 0 %
Lymphocytes Relative: 31 %
Lymphs Abs: 2.3 10*3/uL (ref 0.7–4.0)
MCH: 27.7 pg (ref 26.0–34.0)
MCHC: 35 g/dL (ref 30.0–36.0)
MCV: 79.1 fL — ABNORMAL LOW (ref 80.0–100.0)
Monocytes Absolute: 0.5 10*3/uL (ref 0.1–1.0)
Monocytes Relative: 7 %
Neutro Abs: 4.4 10*3/uL (ref 1.7–7.7)
Neutrophils Relative %: 60 %
Platelets: 310 10*3/uL (ref 150–400)
RBC: 4.84 MIL/uL (ref 4.22–5.81)
RDW: 14.3 % (ref 11.5–15.5)
WBC: 7.3 10*3/uL (ref 4.0–10.5)
nRBC: 0 % (ref 0.0–0.2)

## 2019-11-24 LAB — SALICYLATE LEVEL: Salicylate Lvl: <7 mg/dL — ABNORMAL LOW (ref 7.0–30.0)

## 2019-11-24 LAB — ETHANOL: Alcohol, Ethyl (B): <10 mg/dL (ref <10)

## 2019-11-24 LAB — CBG MONITORING, ED: Glucose-Capillary: 102 mg/dL — ABNORMAL HIGH (ref 70–99)

## 2019-11-24 NOTE — ED Provider Notes (Signed)
[facility] EMERGENCY DEPARTMENT Provider Note   CSN: [id number] Arrival date & time: 11/23/19  2330     History Chief Complaint  Patient presents with  . Drug Problem    Tristan [name] is a 34 y.o. male.  HPI Comments: Tristan [name] is a 34 y.o. male who presents to the Emergency Department via [employer] in restraints due to possible drug ingestion. Pt is altered and has difficulty answering questions. At times states he "is in hell" and refers to medical providers as "angels". He will intermittently scratch at his skin and is writhing on the stretcher and then will periodically become somnolent. [employer] states that he was arrested this evening and believe that he may have taken heroin and cocaine. Upon further questioning patient states that he ingested heroin. Per records, pt has a significant history of polysubstance abuse and mental health issues. He denies taking any regular medication "because I can't obtain them".   The history is provided by the patient, medical records and the police. The history is limited by the condition of the patient. No language interpreter was used.  Drug Problem This is a recurrent problem. The current episode started 1 to 2 hours ago. The problem has not changed since onset.     Past Medical History:  Diagnosis Date  . Bipolar 1 disorder (HCC)   . Hypertension   . PTSD (post-traumatic stress disorder)     Patient Active Problem List   Diagnosis Date Noted  . Substance induced mood disorder (HCC) 09/29/2019  . MDD (major depressive disorder), recurrent episode, severe (HCC) 02/20/2019  . Polysubstance abuse (HCC) 01/29/2019  . MDD (major depressive disorder), severe (HCC) 01/27/2019  . Suicide attempt (HCC)   . Bipolar 1 disorder, mixed (HCC) 05/02/2014    Past Surgical History:  Procedure Laterality Date  . NO PAST SURGERIES        Family History  Problem Relation Age of Onset  . Other Father   .  Psychiatric Illness Father     Social History   Tobacco Use  . Smoking status: Current Every Day Smoker    Packs/day: 0.50  . Smokeless tobacco: Never Used  Substance Use Topics  . Alcohol use: Never  . Drug use: Yes    Types: Marijuana, Heroin, Cocaine    Comment: denies drug use    Home Medications Prior to Admission medications   Medication Sig Start Date End Date Taking? Authorizing Provider  dicyclomine (BENTYL) 20 MG tablet Take 1 tablet (20 mg total) by mouth 2 (two) times daily. 10/11/19   Elpidio Anis, PA-C  FLUoxetine (PROZAC) 10 MG capsule Take 1 capsule (10 mg total) by mouth daily. 02/23/19   Money, Gerlene Burdock, FNP  hydrOXYzine (ATARAX/VISTARIL) 25 MG tablet Take 1 tablet (25 mg total) by mouth every 6 (six) hours as needed for anxiety. 02/23/19   Money, Gerlene Burdock, FNP  QUEtiapine (SEROQUEL) 50 MG tablet Take 1 tablet (50 mg total) by mouth at bedtime. 02/23/19   Money, Gerlene Burdock, FNP    Allergies    Tomato  Review of Systems   Review of Systems  Unable to perform ROS: Mental status change   Physical Exam Updated Vital Signs BP (!) 135/96 (BP Location: Right Arm)   Pulse (!) 110   Temp 98.5 F (36.9 C) (Oral)   Resp 16   SpO2 97%   Physical Exam Vitals and nursing note reviewed.  Constitutional:      General: He is  in acute distress.     Appearance: He is normal weight. He is not ill-appearing, toxic-appearing or diaphoretic.     Comments: Extremely anxious AAM that is lying supine. He will intermittently writhe on the stretcher, frequently becomes tearful, and has difficulty clearly answering questions.  HENT:     Head: Normocephalic and atraumatic.     Nose: Nose normal.  Eyes:     Extraocular Movements: Extraocular movements intact.     Comments: Pupillary constriction noted bilaterally. Minimally responsive to light.   Cardiovascular:     Rate and Rhythm: Regular rhythm. Tachycardia present.     Pulses: Normal pulses.     Heart sounds: Normal heart  sounds. No murmur. No friction rub. No gallop.   Pulmonary:     Effort: Pulmonary effort is normal. No respiratory distress.     Breath sounds: Normal breath sounds. No stridor. No wheezing, rhonchi or rales.  Abdominal:     General: Abdomen is flat.     Tenderness: There is no abdominal tenderness.     Comments: Healed excoriations noted diffusely across the torso  Musculoskeletal:        General: Normal range of motion.     Cervical back: Normal range of motion.  Skin:    General: Skin is warm and dry.  Neurological:     Mental Status: He is lethargic and disoriented.  Psychiatric:        Attention and Perception: He is inattentive.        Mood and Affect: Mood is anxious and depressed. Affect is tearful.        Speech: Speech is rapid and pressured.        Behavior: Behavior is agitated and hyperactive.        Thought Content: Thought content is paranoid and delusional.    ED Results / Procedures / Treatments   Labs (all labs ordered are listed, but only abnormal results are displayed) Labs Reviewed  COMPREHENSIVE METABOLIC PANEL - Abnormal; Notable for the following components:      Result Value   Glucose, Bld 111 (*)    Creatinine, Ser 1.27 (*)    All other components within normal limits  SALICYLATE LEVEL - Abnormal; Notable for the following components:   Salicylate Lvl <7.0 (*)    All other components within normal limits  ACETAMINOPHEN LEVEL - Abnormal; Notable for the following components:   Acetaminophen (Tylenol), Serum <10 (*)    All other components within normal limits  RAPID URINE DRUG SCREEN, HOSP PERFORMED - Abnormal; Notable for the following components:   Opiates POSITIVE (*)    Cocaine POSITIVE (*)    Amphetamines POSITIVE (*)    Tetrahydrocannabinol POSITIVE (*)    All other components within normal limits  CBC WITH DIFFERENTIAL/PLATELET - Abnormal; Notable for the following components:   HCT 38.3 (*)    MCV 79.1 (*)    All other components within  normal limits  CBG MONITORING, ED - Abnormal; Notable for the following components:   Glucose-Capillary 102 (*)    All other components within normal limits  ETHANOL    EKG EKG Interpretation  Date/Time:  Wednesday November 24 2019 00:54:59 EST Ventricular Rate:  99 PR Interval:    QRS Duration: 90 QT Interval:  337 QTC Calculation: 433 R Axis:   89 Text Interpretation: Sinus rhythm Ventricular premature complex Aberrant complex LAE, consider biatrial enlargement No significant change since last tracing Confirmed by Zadie Rhine ([id number]) on 11/24/2019 1:04:13 AM  Radiology No results found.  Procedures Procedures   Medications Ordered in ED Medications - No data to display  ED Course  I have reviewed the triage vital signs and the nursing notes.  Pertinent labs & imaging results that were available during my care of the patient were reviewed by me and considered in my medical decision making (see chart for details).    MDM Rules/Calculators/A&P                      12:25 AM Pt is a 34 year old AAM that presents via [employer] due to possible drug ingestion. Pt is erratic during the exam and it is difficult to obtain clear answers. He has constricted pupils and is tachycardic. Will obtain labs and will reassess.   2:16 AM Lab results positive for THC, amphetamines, cocaine and opiates. Per medical records patient has a significant history of substance abuse. Patient still erratic upon reevaluation and does not clearly answer questions. Will continue to monitor and expect to discharge in police custody.   4:14 AM Reassessed patient and he is now significantly calmer. VSS and no longer exhibiting any tachycardia. Remaining lab work is reassuring. Will d/c patient into police custody.    Final Clinical Impression(s) / ED Diagnoses Final diagnoses:  Polysubstance abuse [facility])    Rx / DC Orders ED Discharge Orders    None       Rayna Sexton, PA-C 11/24/19 0443      Ripley Fraise, MD 11/24/19 [phone]

## 2019-11-24 NOTE — Discharge Instructions (Addendum)
Please follow up with [facility] based on the contact information provided.

## 2019-12-28 ENCOUNTER — Other Ambulatory Visit: Payer: Self-pay | Admitting: *Deleted

## 2019-12-28 NOTE — Patient Outreach (Signed)
Outreach to pt's father, Plez Belton. He advised that his son is out of jail but has quickly returned to using drugs. He will try to outreach him directly and alter pt's mother that I am trying to reach him.  I will provide a list of outpt and inpt treatment centers and engage a social worker once I am engaged. If pt chooses not to engage will close case with hopes that one day he will want help.  [name]. [name], MSN, [facility] Gerontological Nurse Practitioner [facility] Care Management [phone]

## 2019-12-29 ENCOUNTER — Encounter (HOSPITAL_COMMUNITY): Payer: Self-pay

## 2019-12-29 ENCOUNTER — Emergency Department (HOSPITAL_COMMUNITY)
Admission: EM | Admit: 2019-12-29 | Discharge: 2019-12-29 | Disposition: A | Discharge status: Home / Self Care | Payer: 59 | Attending: Emergency Medicine | Admitting: Emergency Medicine

## 2019-12-29 DIAGNOSIS — Z79899 Other long term (current) drug therapy: Secondary | ICD-10-CM | POA: Diagnosis not present

## 2019-12-29 DIAGNOSIS — Z008 Encounter for other general examination: Secondary | ICD-10-CM | POA: Diagnosis not present

## 2019-12-29 DIAGNOSIS — F17200 Nicotine dependence, unspecified, uncomplicated: Secondary | ICD-10-CM | POA: Diagnosis not present

## 2019-12-29 DIAGNOSIS — F1910 Other psychoactive substance abuse, uncomplicated: Secondary | ICD-10-CM | POA: Diagnosis not present

## 2019-12-29 DIAGNOSIS — R079 Chest pain, unspecified: Secondary | ICD-10-CM | POA: Diagnosis present

## 2019-12-29 DIAGNOSIS — I10 Essential (primary) hypertension: Secondary | ICD-10-CM | POA: Diagnosis not present

## 2019-12-29 NOTE — Discharge Instructions (Signed)
Patient is medically cleared for incarceration.

## 2019-12-29 NOTE — ED Provider Notes (Signed)
[facility]-EMERGENCY DEPT Provider Note   CSN: [id number] Arrival date & time: 12/29/19  0107     History Chief Complaint  Patient presents with  . Chest Pain  . Vomiting    Tristan [name] is a 34 y.o. male.  HPI     This a 34 year old male with a history of hypertension and bipolar disorder who presents in police custody.  Per report, he was being arrested and taken to jail.  He began to complain of chest pain and vomiting.  Reportedly injected a drug prior to being taken into police custody.  Per nursing report, patient reported "I am good I just got some hot past heroin and they can take me to jail."  On my evaluation, patient does not contribute to history taking.  He will not answer directed questions.  He denies any chest pain at this time.  He has not had any vomiting.  He will not tell me what he injected and states he has never used drugs before which I question.  He became very aggressive.  I tried to de-escalate the situation; however, patient continued to yell.  He is refusing any further work-up.  Past Medical History:  Diagnosis Date  . Bipolar 1 disorder (HCC)   . Hypertension   . PTSD (post-traumatic stress disorder)     Patient Active Problem List   Diagnosis Date Noted  . Substance induced mood disorder (HCC) 09/29/2019  . MDD (major depressive disorder), recurrent episode, severe (HCC) 02/20/2019  . Polysubstance abuse (HCC) 01/29/2019  . MDD (major depressive disorder), severe (HCC) 01/27/2019  . Suicide attempt (HCC)   . Bipolar 1 disorder, mixed (HCC) 05/02/2014    Past Surgical History:  Procedure Laterality Date  . NO PAST SURGERIES         Family History  Problem Relation Age of Onset  . Other Father   . Psychiatric Illness Father     Social History   Tobacco Use  . Smoking status: Current Every Day Smoker    Packs/day: 0.50  . Smokeless tobacco: Never Used  Substance Use Topics  . Alcohol use: Never    . Drug use: Yes    Types: Marijuana, Heroin, Cocaine    Comment: denies drug use    Home Medications Prior to Admission medications   Medication Sig Start Date End Date Taking? Authorizing Provider  dicyclomine (BENTYL) 20 MG tablet Take 1 tablet (20 mg total) by mouth 2 (two) times daily. 10/11/19   Elpidio Anis, PA-C  FLUoxetine (PROZAC) 10 MG capsule Take 1 capsule (10 mg total) by mouth daily. 02/23/19   Money, Gerlene Burdock, FNP  hydrOXYzine (ATARAX/VISTARIL) 25 MG tablet Take 1 tablet (25 mg total) by mouth every 6 (six) hours as needed for anxiety. 02/23/19   Money, Gerlene Burdock, FNP  QUEtiapine (SEROQUEL) 50 MG tablet Take 1 tablet (50 mg total) by mouth at bedtime. 02/23/19   Money, Gerlene Burdock, FNP    Allergies    Tomato  Review of Systems   Review of Systems  Unable to perform ROS: Other (Patient uncooperative)    Physical Exam Updated Vital Signs BP (!) 133/97 (BP Location: Right Arm)   Pulse 95   Temp 98.6 F (37 C) (Oral)   Resp 16   Ht 1.803 m (5\' 11" )   Wt 89.4 kg   SpO2 97%   BMI 27.48 kg/m   Physical Exam Vitals and nursing note reviewed.  Constitutional:  Appearance: He is well-developed. He is not ill-appearing.  HENT:     Head: Normocephalic and atraumatic.  Eyes:     Pupils: Pupils are equal, round, and reactive to light.     Comments: Pupils 7 mm and reactive bilaterally  Cardiovascular:     Rate and Rhythm: Normal rate and regular rhythm.     Heart sounds: Normal heart sounds. No murmur.  Pulmonary:     Effort: Pulmonary effort is normal. No respiratory distress.     Breath sounds: Normal breath sounds. No wheezing.  Abdominal:     Palpations: Abdomen is soft.     Tenderness: There is no abdominal tenderness. There is no rebound.  Musculoskeletal:     Cervical back: Neck supple.     Right lower leg: No tenderness. No edema.     Left lower leg: No tenderness. No edema.  Lymphadenopathy:     Cervical: No cervical adenopathy.  Skin:    General:  Skin is warm and dry.  Neurological:     Mental Status: He is alert and oriented to person, place, and time.  Psychiatric:     Comments: Agitated and aggressive     ED Results / Procedures / Treatments   Labs (all labs ordered are listed, but only abnormal results are displayed) Labs Reviewed - No data to display  EKG ED ECG REPORT   Date: 12/29/2019  Rate: 98  Rhythm: normal sinus rhythm  QRS Axis: right  Intervals: normal  ST/T Wave abnormalities: normal  Conduction Disutrbances:none  Narrative Interpretation:   Old EKG Reviewed: unchanged  I have personally reviewed the EKG tracing and agree with the computerized printout as noted.  Radiology No results found.  Procedures Procedures (including critical care time)  Medications Ordered in ED Medications - No data to display  ED Course  I have reviewed the triage vital signs and the nursing notes.  Pertinent labs & imaging results that were available during my care of the patient were reviewed by me and considered in my medical decision making (see chart for details).    MDM Rules/Calculators/A&P                       Patient presents with police for medical clearance.  Reportedly vomiting and called out for chest pain.  EKG was reviewed and shows no acute ischemia or arrhythmia.  Patient is very uncooperative and does not provide any history.  He denies chest pain at this time.  Otherwise he is elusive about what he took.  He is very agitated.  Low concern for heroin overdose with clinical implications.  He is not currently vomiting.  Vital signs are largely reassuring including blood pressure, heart rate and O2 sats.  Patient will be discharged into police custody.  After history, exam, and medical workup I feel the patient has been appropriately medically screened and is safe for discharge home. Pertinent diagnoses were discussed with the patient. Patient was given return precautions.   Final Clinical  Impression(s) / ED Diagnoses Final diagnoses:  Medical clearance for incarceration  Polysubstance abuse [facility])    Rx / DC Orders ED Discharge Orders    None       Sender Rueb, Barbette Hair, MD 12/29/19 0236

## 2019-12-29 NOTE — ED Triage Notes (Signed)
Arrived by EMS in custody of [employer]. EMS reports patient called out for chest pain and vomiting. On arrival patient was lethargic. Patient reports injecting heroin. Patient alert and oriented on arrival to this facility.   Patient  states chest pain is intermittent but not hurting right now

## 2019-12-29 NOTE — ED Notes (Signed)
Pt in [employer] custody, pt started to complains of chest pain and vomiting when going to jail, after arriving to the ED he states, "I'm good, I just had some hot ass heroin, go ahead and take me to jail"

## 2020-01-21 ENCOUNTER — Encounter (HOSPITAL_COMMUNITY): Payer: Self-pay

## 2020-01-21 ENCOUNTER — Other Ambulatory Visit: Payer: Self-pay

## 2020-01-21 ENCOUNTER — Emergency Department (HOSPITAL_COMMUNITY)
Admission: EM | Admit: 2020-01-21 | Discharge: 2020-01-21 | Disposition: A | Discharge status: Home / Self Care | Payer: 59 | Attending: Emergency Medicine | Admitting: Emergency Medicine

## 2020-01-21 DIAGNOSIS — I10 Essential (primary) hypertension: Secondary | ICD-10-CM | POA: Diagnosis not present

## 2020-01-21 DIAGNOSIS — F11929 Opioid use, unspecified with intoxication, unspecified: Secondary | ICD-10-CM | POA: Insufficient documentation

## 2020-01-21 DIAGNOSIS — F17210 Nicotine dependence, cigarettes, uncomplicated: Secondary | ICD-10-CM | POA: Diagnosis not present

## 2020-01-21 DIAGNOSIS — F1190 Opioid use, unspecified, uncomplicated: Secondary | ICD-10-CM

## 2020-01-21 DIAGNOSIS — Z79899 Other long term (current) drug therapy: Secondary | ICD-10-CM | POA: Diagnosis not present

## 2020-01-21 MED ORDER — NALOXONE HCL 4 MG/0.1ML NA LIQD
1.0000 | Freq: Once | NASAL | Status: AC
  Administered 2020-01-21: 1 via NASAL
  Filled 2020-01-21: qty 4

## 2020-01-21 NOTE — ED Notes (Signed)
Pt verbalizes understanding of DC instructions. Pt belongings returned and is ambulatory out of ED.  

## 2020-01-21 NOTE — ED Triage Notes (Signed)
Pt arrived via [facility] from public location in [employer] custody. CC substance use and behavioral issues. Pt denies drug use today. [employer] found needles on scence. Per EMS ambulatory and A/OX4    Hx drug use heroin, bipolar, schizophrenia, non compliant with medications

## 2020-01-21 NOTE — ED Provider Notes (Addendum)
Tristan [name] Provider Note   CSN: [id number] Arrival date & time: 01/21/20  1640     History Chief Complaint  Patient presents with  . Addiction Problem    Drowsiness     Tristan [name] is a 34 y.o. male.  HPI   34 year old male brought in for evaluation for abnormal behavior.  Patient was a passenger in a stolen vehicle that was stopped by police.  They noted drug paraphernalia in the car.  They took patient into custody for an outstanding warrant.  He then began acting very erratically.  Seemed somnolent at times but then would then shake and jerk and yell out.  Patient does admit to drug use today.  He states that he thinks he took heroin.  Past Medical History:  Diagnosis Date  . Bipolar 1 disorder (HCC)   . Hypertension   . PTSD (post-traumatic stress disorder)     Patient Active Problem List   Diagnosis Date Noted  . Substance induced mood disorder (HCC) 09/29/2019  . MDD (major depressive disorder), recurrent episode, severe (HCC) 02/20/2019  . Polysubstance abuse (HCC) 01/29/2019  . MDD (major depressive disorder), severe (HCC) 01/27/2019  . Suicide attempt (HCC)   . Bipolar 1 disorder, mixed (HCC) 05/02/2014   Past Surgical History:  Procedure Laterality Date  . NO PAST SURGERIES       Family History  Problem Relation Age of Onset  . Other Father   . Psychiatric Illness Father     Social History   Tobacco Use  . Smoking status: Current Every Day Smoker    Packs/day: 0.50  . Smokeless tobacco: Never Used  Substance Use Topics  . Alcohol use: Never  . Drug use: Yes    Types: Marijuana, Heroin, Cocaine    Comment: denies drug use    Home Medications Prior to Admission medications   Medication Sig Start Date End Date Taking? Authorizing Provider  dicyclomine (BENTYL) 20 MG tablet Take 1 tablet (20 mg total) by mouth 2 (two) times daily. 10/11/19   Elpidio Anis, PA-C  FLUoxetine (PROZAC) 10 MG capsule  Take 1 capsule (10 mg total) by mouth daily. 02/23/19   Money, Gerlene Burdock, FNP  hydrOXYzine (ATARAX/VISTARIL) 25 MG tablet Take 1 tablet (25 mg total) by mouth every 6 (six) hours as needed for anxiety. 02/23/19   Money, Gerlene Burdock, FNP  QUEtiapine (SEROQUEL) 50 MG tablet Take 1 tablet (50 mg total) by mouth at bedtime. 02/23/19   Money, Gerlene Burdock, FNP    Allergies    Tomato  Review of Systems   Review of Systems All systems reviewed and negative, other than as noted in HPI.  Physical Exam Updated Vital Signs SpO2 98%   Physical Exam Vitals and nursing note reviewed.  Constitutional:      General: He is not in acute distress.    Appearance: He is well-developed.     Comments: Laying in bed.  Appears to be sleeping.  Drowsy.  Opens eyes to voice and will answer questions but will sometimes fall asleep in mid sentence. Sometimes will having jerking movements or seem startled when he is awakened.   HENT:     Head: Normocephalic and atraumatic.  Eyes:     General:        Right eye: No discharge.        Left eye: No discharge.     Comments: Pupils 65mm, symmetric. Reactive. Conjunctiva injected.   Cardiovascular:  Rate and Rhythm: Normal rate and regular rhythm.     Heart sounds: Normal heart sounds. No murmur. No friction rub. No gallop.   Pulmonary:     Effort: Pulmonary effort is normal. No respiratory distress.     Breath sounds: Normal breath sounds.  Abdominal:     General: There is no distension.     Palpations: Abdomen is soft.     Tenderness: There is no abdominal tenderness.  Musculoskeletal:        General: No tenderness.     Cervical back: Neck supple.  Skin:    General: Skin is warm and dry.     ED Results / Procedures / Treatments   Labs (all labs ordered are listed, but only abnormal results are displayed) Labs Reviewed - No data to display  EKG EKG Interpretation  Date/Time:  Friday January 21 2020 17:02:48 EDT Ventricular Rate:  81 PR Interval:    QRS  Duration: 101 QT Interval:  381 QTC Calculation: 443 R Axis:   86 Text Interpretation: Sinus rhythm Ventricular premature complex RSR' in V1 or V2, right VCD or RVH Confirmed by Virgel Manifold [phone]) on 01/21/2020 5:34:00 PM   Radiology No results found.  Procedures Procedures (including critical care time)  Medications Ordered in ED Medications - No data to display  ED Course  I have reviewed the triage vital signs and the nursing notes.  Pertinent labs & imaging results that were available during my care of the patient were reviewed by me and considered in my medical decision making (see chart for details).    MDM Rules/Calculators/A&P                      34yM with likely intentional drug use. Past visits for the same. HE admits to taking what he thinks was heroin shortly before stopped by police. Given narcan with expected response. Much more awake and conversant. Has no complaints aside from "my tongue feels weird after she (nurse) gave me that stuff (intranasal narcan)." Will DC into police custody.   Final Clinical Impression(s) / ED Diagnoses Final diagnoses:  Heroin use    Rx / DC Orders ED Discharge Orders    None       Virgel Manifold, MD 01/21/20 1728    Virgel Manifold, MD 01/21/20 1734

## 2020-01-21 NOTE — ED Notes (Addendum)
[employer] not at bedside, left wothout getting observation form signed. MD made aware

## 2020-01-21 NOTE — ED Notes (Signed)
Pt reports " I used 1 gram of Heroin today and have used off and on for about a year"

## 2020-03-02 ENCOUNTER — Other Ambulatory Visit: Payer: Self-pay | Admitting: *Deleted

## 2020-03-02 NOTE — Patient Outreach (Signed)
[facility]) Care Management  03/02/2020  Ezariah Nace [facility] [birth year] [id number]   Telephone outreach.   Spoke with pt's father, Rahman Ferrall. He informs me that his son is still incarcerated. We discussed the situation at length and NP provided support. Advised that NP will be available for future consultation if pt is willing. Gretel Acre asked permission to call me when he talks to his son from jail to engage and introduce myself, [facility] Care Management and offer support. NP would gladly cooperate with this request.  Noralyn Pick C. [name], MSN, [facility] Gerontological Nurse Practitioner [facility] Care Management [phone]

## 2020-03-03 ENCOUNTER — Other Ambulatory Visit: Payer: Self-pay | Admitting: *Deleted

## 2020-03-03 NOTE — Patient Outreach (Signed)
[facility]) Care Management  03/03/2020  Tristan [name] June 02, 1986 [id number]  Incoming telephone call from Gretel Acre, father of [name]. We had a 3 way conversation. Pt states he is ready to get his life of the right track when he is released and he looks forward to having [facility] support. He expressed that he does want to enter a residential program immediately when released. He acknowledges he needs to eliminate his opportunities for bad influences and situations that have often lead to his drug use. He says he wants to go into the [facility] program in [place].  Advised, NP will support and assist any way. Will call [facility] and advise of his wishes to go directly from jail to the program.  Gretel Acre to advise me of pt release. I will open pt case at that time.  [name]. [name], MSN, [facility] Gerontological Nurse Practitioner [facility] Care Management [phone]

## 2021-08-26 ENCOUNTER — Other Ambulatory Visit: Payer: Self-pay

## 2021-08-26 ENCOUNTER — Emergency Department (HOSPITAL_COMMUNITY)
Admission: EM | Admit: 2021-08-26 | Discharge: 2021-08-26 | Disposition: A | Discharge status: Home / Self Care | Payer: 59 | Attending: Emergency Medicine | Admitting: Emergency Medicine

## 2021-08-26 ENCOUNTER — Encounter (HOSPITAL_COMMUNITY): Payer: Self-pay

## 2021-08-26 DIAGNOSIS — I10 Essential (primary) hypertension: Secondary | ICD-10-CM | POA: Diagnosis not present

## 2021-08-26 DIAGNOSIS — T401X1A Poisoning by heroin, accidental (unintentional), initial encounter: Secondary | ICD-10-CM | POA: Insufficient documentation

## 2021-08-26 DIAGNOSIS — F17210 Nicotine dependence, cigarettes, uncomplicated: Secondary | ICD-10-CM | POA: Diagnosis not present

## 2021-08-26 NOTE — ED Provider Notes (Signed)
[facility]-EMERGENCY DEPT Provider Note   CSN: [id number] Arrival date & time: 08/26/21  1929     History Chief Complaint  Patient presents with   Drug Overdose    Tristan [name] is a 35 y.o. male.  HPI    35 year old male comes in with chief complaint of overdose.  Patient has history of substance use disorder.  Reports that he just got out of jail few days ago.  He does admit to using some heroin earlier today.  Patient's mother called EMS when he was acting abnormally.  Patient not able to provide any other further meaningful history.  He denies any HI, SI, hallucinations.  Past Medical History:  Diagnosis Date   Bipolar 1 disorder (HCC)    Hypertension    PTSD (post-traumatic stress disorder)     Patient Active Problem List   Diagnosis Date Noted   Substance induced mood disorder (HCC) 09/29/2019   MDD (major depressive disorder), recurrent episode, severe (HCC) 02/20/2019   Polysubstance abuse (HCC) 01/29/2019   MDD (major depressive disorder), severe (HCC) 01/27/2019   Suicide attempt (HCC)    Bipolar 1 disorder, mixed (HCC) 05/02/2014    Past Surgical History:  Procedure Laterality Date   NO PAST SURGERIES         Family History  Problem Relation Age of Onset   Other Father    Psychiatric Illness Father     Social History   Tobacco Use   Smoking status: Every Day    Packs/day: 0.50    Types: Cigarettes   Smokeless tobacco: Never  Substance Use Topics   Alcohol use: Never   Drug use: Yes    Types: Marijuana, Heroin, Cocaine    Comment: denies drug use    Home Medications Prior to Admission medications   Medication Sig Start Date End Date Taking? Authorizing Provider  dicyclomine (BENTYL) 20 MG tablet Take 1 tablet (20 mg total) by mouth 2 (two) times daily. 10/11/19   Elpidio Anis, PA-C  FLUoxetine (PROZAC) 10 MG capsule Take 1 capsule (10 mg total) by mouth daily. 02/23/19   Money, Gerlene Burdock, FNP  hydrOXYzine  (ATARAX/VISTARIL) 25 MG tablet Take 1 tablet (25 mg total) by mouth every 6 (six) hours as needed for anxiety. 02/23/19   Money, Gerlene Burdock, FNP  QUEtiapine (SEROQUEL) 50 MG tablet Take 1 tablet (50 mg total) by mouth at bedtime. 02/23/19   Money, Gerlene Burdock, FNP    Allergies    Tomato  Review of Systems   Review of Systems  Constitutional:  Positive for activity change.  Respiratory:  Negative for shortness of breath.   Cardiovascular:  Negative for chest pain.  Gastrointestinal:  Negative for nausea and vomiting.  Neurological:  Negative for headaches.  All other systems reviewed and are negative.  Physical Exam Updated Vital Signs BP 120/70 (BP Location: Left Arm)   Pulse 80   Temp 98.7 F (37.1 C) (Oral)   Resp 15   Ht 5\' 11"  (1.803 m)   Wt 88.5 kg   SpO2 99%   BMI 27.20 kg/m   Physical Exam Vitals and nursing note reviewed.  Constitutional:      Appearance: He is well-developed.  HENT:     Head: Atraumatic.  Cardiovascular:     Rate and Rhythm: Normal rate.  Pulmonary:     Effort: Pulmonary effort is normal.  Musculoskeletal:     Cervical back: Neck supple.  Skin:    General: Skin is warm.  Neurological:     Mental Status: He is alert and oriented to person, place, and time.    ED Results / Procedures / Treatments   Labs (all labs ordered are listed, but only abnormal results are displayed) Labs Reviewed - No data to display  EKG None  Radiology No results found.  Procedures Procedures   Medications Ordered in ED Medications - No data to display  ED Course  I have reviewed the triage vital signs and the nursing notes.  Pertinent labs & imaging results that were available during my care of the patient were reviewed by me and considered in my medical decision making (see chart for details).  Clinical Course as of 08/26/21 2119  Wynelle Link Aug 26, 2021  2113 Patient wants to now leave now.  He informs me that he has a job, and would be leaving tomorrow at  6:30 in the morning from his mom's home when she leaves for work to get to the other [place] where he lives.  He admits to heroin use earlier today. He admits that he has addiction. He denies any SI, HI.  He informs me that he has not been on any psychiatric medication for months, including when he was in the prison.  He is not having any hallucinations.  I have sent a message to [facility] counselor that has had contact with them before, and might be able to give him outpatient resources such as [facility]. [AN]    Clinical Course User Index [AN] Derwood Kaplan, MD   MDM Rules/Calculators/A&P                           Patient brought in by EMS from home with chief complaint of altered mental status. Patient noted to be lethargic but not given Narcan.  He is easily arousable and answering my question.  Patient's mother reports that pt arrived to her home around 6 pm and he was ok - but then she saw the patient laying on the floor and then he got up and started doing jumping jacks. Pt then fell again, stopped breathing and she had to shake him. His eyes had rolled back. She has not seen him behave like this before. Pt is not taking his psychiatric medications. She thinks he is self-medicating.   At this time, patient is denying any SI, HI.  He reports that the overdose was accidental.  He is not appearing manic, psychotic and not currently decompensated from his psychiatric illnesses.  He has agreed to be monitored in the ED.   9:20 PM Patient wants to leave against medical advice. Patient understands that his actions will lead to inadequate monitoring and his refusal to seek further treatment might lead to increased morbidity and mortality.  Alternative options discussed : Getting TOC consultation. Opportunity to change mind given. Discussion witnessed by patient's RN, who was at the bedside Patient is demonstrating good capacity to make decision. Patient understands that he needs to return to  the ER immediately if his symptoms get worse.  I have sent a message to [facility] staff [facility]) to see if she can follow-up with him  Final Clinical Impression(s) / ED Diagnoses Final diagnoses:  Accidental overdose of heroin, initial encounter [facility])    Rx / DC Orders ED Discharge Orders     None        Derwood Kaplan, MD 08/26/21 2122

## 2021-08-26 NOTE — ED Notes (Signed)
Patient stated he was ready to leave. He said he knows where to find the support if he needed help. He understands the dangers of leaving and doing more heroin and understands that drugs can have mixed things in them causing death. Patient still wanting to leave. Dr. Rhunette Croft at bedside discussing the risks with the patient of doing drugs and leaving now.

## 2021-08-26 NOTE — Discharge Instructions (Addendum)
Substance Abuse Treatment Programs ° °Intensive Outpatient Programs °[facility]     °[address]      °[place], [place]                   °[phone]      ° °[facility] °[address] °[place], [place] °[phone] ° °[facility]     °(Inpatient and outpatient)     °[address]           °[phone]   ° °[facility] °[phone] (Suboxone and Methadone) ° °[address]      °[place], [place] [address]      °336-882-2125      ° °[address] °[place], [place] °852-3033 ° °[facility] (Outpatient/Inpatient, Chemical)    °(insurance only) [phone]      °       °[facility] (Groups & Residential) °[place], [place] °[phone] ° °   °[facility]     °[address]     °[place], [place]      °[phone]      ° °[facility] (for caregivers and family) °[address] °[place], [place] °336-299-4655 ° ° ° ° ° °Residential Treatment Programs °[facility]      °[address], [place], [place] [address]  °([phone]      ° °[facility] °[address] [place], [place] [address] °[phone] ° °[facility]        °336-248-8914      ° °[facility] °1-800-659-3381 ° °[facility]             °[address]                                         °[place], [place]                                                °877-615-2722 or 336-784-9470                              ° °[facility] [place] °[address] °[place], [address] °[phone] ° °[facility]    °[address]      °[place], [place]     °[phone]      ° °[facility] °[address] °[place], [place] °336-285-9073 ° °[facility]   °[address]     °[place], [place] [address]     °[phone]      °Admissions: 8am-3pm M-F ° °[facility] °[address] °[place] °336-227-7417 ° °[facility]: Residential Program (90 Days)   °[place], [place]      °336-725-8389 or 800-758-6077    ° °[facility] °[place], [place] °(Walk in Hours over the weekend or by referral) ° °[facility] °[address], [place],  [address] °([phone] ° °Crisis Mobile: [facility]:  1-877-626-1772 (for crisis response 24 hours a day) °[facility] Hotline:      1-800-256-2452 °Outpatient Psychiatry and Counseling ° °[facility]: Mobile Crisis   Management 24 hours:  1-877-626-1772 ° °[facility] sliding scale fee and walk in schedule: M-F 8am-12pm/1pm-3pm °[address]  °[place], [place] [address] °[phone] ° °[facility] °[address] °[place], [place] [address] °[phone] ° °[facility] ([facility])- new patient walk-in appointments available Monday - Friday 8am -3pm.          °[address] °[place], [place] [address] °336-676-6840 or crisis line- 336-676-6905 ° °[facility]/ Intensive Outpatient Therapy Program °[address] °[place], [place] [address] °336-832-9804 ° °[facility]                  °Crisis Services      °[phone]      °[address]     °[place], [place] [address]                ° °[facility]   °[facility] °[phone] °[address] °[place], [place] [address] ° ° °[facility]          °[address]  °[place], [place] [address]       °([phone] ° °[facility] °[address] °[place], [place] [address] °336-292-1510 ° °[facility]    °[address]    °[place], [place] [address]     °[phone]      ° °Parish McKinney, MD     °[address]     °[place] [address]     °336-282-1251     °  °[facility] °[address] °[place] [address] ° °[facility]     °[address]     °[place], [place]      °336-542-2076      ° °[facility] °Shamsher Ahluwalia, MD °[address] °[place], [place] [address] °[phone] ° °[facility]     °[address]     °[place], [place] [address]     °336-274-1237      ° °[facility] °[address] °[place], [place] [address] °336-854-4450 °Resources for Temporary Residential Assistance/Crisis Centers ° °[facility] °[facility] °M-F 8am-3pm   °[address] [place], [place] [address]   336-332-0824 °Services include: laundry, barbering, support groups, case management, phone  & computer access, showers, AA/NA mtgs, mental health/substance abuse nurse, job skills class, disability information, VA assistance, spiritual classes, etc.  ° °HOMELESS SHELTERS ° °[facility]     °[facility]   °[address], [place]     °[phone]       °       °[facility] (women and children)       °[address] °[place], [place] [address] °336-275-0820 °[email or web address] for application and process °Application Required ° °[facility]   °[address]    °[place] [address]     °[phone]       °             °[facility] °[address] °[place], [place] [address] °336.273.5572 °336-235-0363(schedule application appt.) °Application Required ° °[facility] (women only)    °[address]     °[place],  [address]     °336-884-1039      °  Intake starts 6pm daily °Need valid ID, SSC, & Police report °[facility] [place] °[address] °[place], [place] °336-881-5420 °Application Required ° °[facility] (men only)     °[address]      °[place], [place]     °[phone]      ° °[facility] °(Pregnant women only) °[address] °[place], [place] °336-275-0206 ° °[facility]      °[address]      °[place], [place] [address]     °[phone]      °       °[facility] °[address] °[place], [place] °336-723-1848 °90 day commitment/SA/Application process ° °[facility](men only)     °[address]     °[place], [place]     °[phone]       °Check-in at 7pm     °       °[facility] °[address] °[place], [place] [address] °336-248-6684 °Men/Women/Women and Children must be there by 7 pm ° °[facility] °[place], [place] °336-722-8721                ° °

## 2021-08-26 NOTE — ED Triage Notes (Signed)
Patient BIB [facility] from home after being released a month ago from prison. He used heroin Thursday and then again tonight. His mom called out after he was "acting odd." Fire department said he was alert when they got there. No narcan given. Lethargic on scene.

## 2021-08-27 ENCOUNTER — Other Ambulatory Visit: Payer: Self-pay | Admitting: *Deleted

## 2021-08-27 NOTE — Patient Outreach (Signed)
[facility]) Care Management  08/27/2021  Bobak Oguinn [facility] April 04, 1986 [id number]  Received message from Dr.  For assistance with prior pt. Pt has ongoing addiction and safety issues due to his use and behaviors and unsafe situations. Called and talked with pt's father, Tavarius Grewe who reciting pt's recent difficulties and about their fear of what he could do to himself of others when he is impaired. Advised NP to find out process of involuntary commitment. LCSW provided instructions and this was passed on verbally and by text to [employer].  Of note, [facility] is no longer providing care management services for [facility] patients and therefore NP will not open a new case.  [name]. [name], MSN, [facility] Gerontological Nurse Practitioner [facility] Care Management [phone]

## 2021-09-07 ENCOUNTER — Emergency Department (HOSPITAL_COMMUNITY)
Admission: EM | Admit: 2021-09-07 | Discharge: 2021-09-07 | Disposition: A | Discharge status: Home / Self Care | Payer: 59 | Attending: Emergency Medicine | Admitting: Emergency Medicine

## 2021-09-07 ENCOUNTER — Encounter (HOSPITAL_COMMUNITY): Payer: Self-pay

## 2021-09-07 DIAGNOSIS — Z5321 Procedure and treatment not carried out due to patient leaving prior to being seen by health care provider: Secondary | ICD-10-CM | POA: Diagnosis not present

## 2021-09-07 DIAGNOSIS — Z760 Encounter for issue of repeat prescription: Secondary | ICD-10-CM | POA: Insufficient documentation

## 2021-09-07 NOTE — ED Triage Notes (Addendum)
Patient BIB EMS with complaint of being off of psych meds x 1 year and would like to be placed back on them.  Denies pain or any other complaints.

## 2021-10-05 ENCOUNTER — Encounter (HOSPITAL_COMMUNITY): Payer: Self-pay

## 2021-10-05 ENCOUNTER — Emergency Department (HOSPITAL_COMMUNITY)
Admission: EM | Admit: 2021-10-05 | Discharge: 2021-10-05 | Disposition: A | Discharge status: Home / Self Care | Payer: Self-pay | Attending: Emergency Medicine | Admitting: Emergency Medicine

## 2021-10-05 ENCOUNTER — Other Ambulatory Visit: Payer: Self-pay

## 2021-10-05 DIAGNOSIS — R4182 Altered mental status, unspecified: Secondary | ICD-10-CM | POA: Insufficient documentation

## 2021-10-05 MED ORDER — DIPHENHYDRAMINE HCL 25 MG PO CAPS
50.0000 mg | ORAL_CAPSULE | Freq: Once | ORAL | Status: AC
  Administered 2021-10-05: 50 mg via ORAL
  Filled 2021-10-05: qty 2

## 2021-10-05 NOTE — ED Notes (Signed)
Vitals rechecked multiple times blood pressure is still reading low. Pt in and out of sleep.

## 2021-10-05 NOTE — ED Notes (Signed)
Pt presents to be increasingly paranoid, becoming paranoid with any staff direction, repeatedly saying "I refuse"

## 2021-10-05 NOTE — ED Notes (Signed)
Pt cont swatting at air and things that are not there, falling asleep while speaking, able to follow simple commands.

## 2021-10-05 NOTE — ED Provider Notes (Signed)
[facility]-EMER[facility]PT Provider Note   CSN: [id number] Arrival date & time: 10/05/21  4098     History  No chief complaint on file.   Tristan [name] is a 36 y.o. male.  The history is provided by the EMS personnel and medical records. No language interpreter was used.   36 year old male significant history of bipolar, polysubstance abuse including heroin abuse brought here via EMS for evaluation of altered mental status.  Per EMS note, bystander saw patient rolling [place] the bush at a motel earlier today, EMS report patient was uncooperative with EMS.  Patient appears altered, level 5 caveat due to altered mental status.  Home Medications Prior to Admission medications   Medication Sig Start Date End Date Taking? Authorizing Provider  dicyclomine (BENTYL) 20 MG tablet Take 1 tablet (20 mg total) by mouth 2 (two) times daily. 10/11/19   Elpidio Anis, PA-C  FLUoxetine (PROZAC) 10 MG capsule Take 1 capsule (10 mg total) by mouth daily. 02/23/19   Money, Gerlene Burdock, FNP  hydrOXYzine (ATARAX/VISTARIL) 25 MG tablet Take 1 tablet (25 mg total) by mouth every 6 (six) hours as needed for anxiety. 02/23/19   Money, Gerlene Burdock, FNP  QUEtiapine (SEROQUEL) 50 MG tablet Take 1 tablet (50 mg total) by mouth at bedtime. 02/23/19   Money, Gerlene Burdock, FNP      Allergies    Tomato    Review of Systems   Review of Systems  Unable to perform ROS: Mental status change   Physical Exam Updated Vital Signs BP (!) 144/82 (BP Location: Left Arm)    Pulse 96    Resp 16    SpO2 (!) 85%  Physical Exam Vitals and nursing note reviewed.  Constitutional:      General: He is not in acute distress.    Appearance: He is well-developed.     Comments: Patient is drowsy but arousable.  HENT:     Head: Atraumatic.     Mouth/Throat:     Mouth: Mucous membranes are dry.     Comments: Lips are dry Eyes:     Conjunctiva/sclera: Conjunctivae normal.     Comments: Pupils 2 mm bilaterally,  reactive  Cardiovascular:     Rate and Rhythm: Normal rate and regular rhythm.     Pulses: Normal pulses.     Heart sounds: Normal heart sounds.  Pulmonary:     Effort: Pulmonary effort is normal.     Breath sounds: Normal breath sounds.  Abdominal:     Palpations: Abdomen is soft.  Musculoskeletal:     Cervical back: Neck supple.  Skin:    Findings: No rash.     Comments: Multiple scattered scratch marks noted throughout body without signs of infection.  Neurological:     Mental Status: He is disoriented.     GCS: GCS eye subscore is 3. GCS verbal subscore is 4. GCS motor subscore is 5.    ED Results / Procedures / Treatments   Labs (all labs ordered are listed, but only abnormal results are displayed) Labs Reviewed - No data to display  EKG None  Radiology No results found.  Procedures Procedures    Medications Ordered in ED Medications - No data to display  ED Course/ Medical Decision Making/ A&P                           Medical Decision Making  BP (!) 144/82 (BP Location: Left Arm)  Pulse 96    Temp 98.3 F (36.8 C) (Oral)    Resp 16    SpO2 (!) 85%   8:09 AM Patient with known history of polysubstance abuse including heroin abuse brought here via EMS when a bystander found him rolling in a bush at a motel.  Patient was uncooperative with EMS initially.  Patient appears to be somnolent but arousable.  Pupils 2 mm and reactive bilaterally.  He has multiple scratch marks noted throughout body without signs of infection.  O2 sats did drop down to 85%'s but when aroused, O2 sats improved.  Will monitor closely, supplemental oxygen placed.  I have considered given Narcan as a suspect he is slightly having altered mental status secondary to polysubstance use including heroin use.  At this time, plan to monitor patient closely until he metabolized drugs.  His initial CBG is 95 no significant signs of head trauma.  I have reviewed patient's prior ER visits which include  multiple visiting involving polysubstance abuse and accidental ingestion of heroin use  9:40 AM  Patient appears to be more awake than earlier.  He is scratching himself persistently.  Is able to protect his airway.  Will give Benadryl for itchiness.  However, he is not stable enough to be discharged at this time.  2:21 PM Patient has been observed in the ED for the past 7 hours.  At this time he is clinically sober, able to ambulate without assist.  Please note his blood pressure is 91/44 however he is without any infectious symptoms and he is able to tolerate p.o.  I spent time encourage patient to avoid recreational drug use as it is negatively harmful for his health.  I will provide patient with outpatient resources for outpatient care.  Encourage patient to return if symptoms worsen.  Patient able to voice understanding and able to make informed decision at this time.        Final Clinical Impression(s) / ED Diagnoses Final diagnoses:  Altered mental status, unspecified altered mental status type    Rx / DC Orders ED Discharge Orders     None         Fayrene Helper, PA-C 10/05/21 1427    Virgina Norfolk, DO 10/05/21 1447

## 2021-10-05 NOTE — ED Notes (Signed)
Pt placed on simple face-mask with 5L/M due to repeatedly removing [place] after multiple attempts.

## 2021-10-05 NOTE — ED Triage Notes (Signed)
Per EMS -a bystander saw the patient rolling in the bushes at a motel and called EMS. Patient has a history of heroin use, but would not tell EMS what he took. Patient uncooperative with EMS.

## 2021-10-05 NOTE — Discharge Instructions (Signed)
Please avoid drug use as it is negatively impact your health.  Please seek help by using resources below.  Return if you have any concern.

## 2021-10-11 ENCOUNTER — Encounter (HOSPITAL_COMMUNITY): Payer: Self-pay | Admitting: Emergency Medicine

## 2021-10-11 ENCOUNTER — Other Ambulatory Visit: Payer: Self-pay

## 2021-10-11 ENCOUNTER — Emergency Department (HOSPITAL_COMMUNITY)
Admission: EM | Admit: 2021-10-11 | Discharge: 2021-10-11 | Disposition: A | Discharge status: Home / Self Care | Payer: Self-pay | Attending: Emergency Medicine | Admitting: Emergency Medicine

## 2021-10-11 DIAGNOSIS — F1110 Opioid abuse, uncomplicated: Secondary | ICD-10-CM | POA: Insufficient documentation

## 2021-10-11 NOTE — ED Provider Notes (Signed)
[facility]-EMERGENCY DEPT Provider Note   CSN: [id number] Arrival date & time: 10/11/21  2033     History  No chief complaint on file.   Tristan [name] is a 36 y.o. male.  Presenting to the emergency room with concern for heroin use.  Patient reports that he feels fine and has no medical complaints.  He denies any thoughts of suicide or homicide.  States that he only used heroin today.  Patient states that he did not overdose today, no Narcan needed today.  Additional history was obtained from EMS report.  Patient was at a [employer] when he was seen to be acting lethargic and abnormal.  HPI     Home Medications Prior to Admission medications   Medication Sig Start Date End Date Taking? Authorizing Provider  dicyclomine (BENTYL) 20 MG tablet Take 1 tablet (20 mg total) by mouth 2 (two) times daily. 10/11/19   Elpidio Anis, PA-C  FLUoxetine (PROZAC) 10 MG capsule Take 1 capsule (10 mg total) by mouth daily. 02/23/19   Money, Gerlene Burdock, FNP  hydrOXYzine (ATARAX/VISTARIL) 25 MG tablet Take 1 tablet (25 mg total) by mouth every 6 (six) hours as needed for anxiety. 02/23/19   Money, Gerlene Burdock, FNP  QUEtiapine (SEROQUEL) 50 MG tablet Take 1 tablet (50 mg total) by mouth at bedtime. 02/23/19   Money, Gerlene Burdock, FNP      Allergies    Tomato    Review of Systems   Review of Systems  All other systems reviewed and are negative.  Physical Exam Updated Vital Signs BP 116/90 (BP Location: Right Arm)    Pulse 77    Temp 98.2 F (36.8 C) (Oral)    Resp 17    Ht 5\' 10"  (1.778 m)    Wt 88.5 kg    SpO2 97%    BMI 27.99 kg/m  Physical Exam Vitals and nursing note reviewed.  Constitutional:      General: He is not in acute distress.    Appearance: He is well-developed.  HENT:     Head: Normocephalic and atraumatic.  Eyes:     Conjunctiva/sclera: Conjunctivae normal.  Cardiovascular:     Rate and Rhythm: Normal rate and regular rhythm.     Heart sounds: No  murmur heard. Pulmonary:     Effort: Pulmonary effort is normal. No respiratory distress.     Breath sounds: Normal breath sounds.  Abdominal:     Palpations: Abdomen is soft.     Tenderness: There is no abdominal tenderness.  Musculoskeletal:        General: No swelling.     Cervical back: Neck supple.  Skin:    General: Skin is warm and dry.     Capillary Refill: Capillary refill takes less than 2 seconds.  Neurological:     General: No focal deficit present.     Mental Status: He is alert and oriented to person, place, and time.  Psychiatric:        Mood and Affect: Mood normal.        Thought Content: Thought content normal.    ED Results / Procedures / Treatments   Labs (all labs ordered are listed, but only abnormal results are displayed) Labs Reviewed - No data to display  EKG None  Radiology No results found.  Procedures Procedures    Medications Ordered in ED Medications - No data to display  ED Course/ Medical Decision Making/ A&P  Medical Decision Making  36 year old gentleman presented to ER with concern for lethargy, heroin abuse via EMS.  Additional history obtained from EMS report, chart review.  Patient currently denies any acute medical complaints.  His vital signs are normal.  He is pleasant.  No SI or HI.  Unremarkable physical exam.  Given lack of any specific medical complaint and reassuring exam, do not feel he requires any laboratory for radiographic work-up in ER today.  Recommend that he follow-up with primary doctor.  Discussed dangers of heroin abuse.  Counseled on illicit substance cessation.  Provided resources for abuse.  Patient was observed in ER for couple hours and remained stable.  Feel he is appropriate for discharge home at this time.    After the discussed management above, the patient was determined to be safe for discharge.  The patient was in agreement with this plan and all questions regarding their  care were answered.  ED return precautions were discussed and the patient will return to the ED with any significant worsening of condition.          Final Clinical Impression(s) / ED Diagnoses Final diagnoses:  Heroin abuse [facility])    Rx / DC Orders ED Discharge Orders     None         Milagros Loll, MD 10/12/21 1534

## 2021-10-11 NOTE — Discharge Instructions (Signed)
Follow-up with your primary care doctor.  Stay away from illicit substances such as heroin.  Come back to ER as needed for any new or concerning medical complaints.

## 2021-10-11 NOTE — ED Triage Notes (Signed)
Patient presents due to heroin use today. He has moments of lethargy but is A&O x 4 when easily awoken. He admits to battling with addiction and would like help.    EMS vitals: 132/80 BP 110 HR 95% SPO2 on room air 95 CBG

## 2021-10-22 ENCOUNTER — Emergency Department (HOSPITAL_COMMUNITY): Admission: EM | Admit: 2021-10-22 | Discharge: 2021-10-22 | Discharge status: Against Medical Advice | Payer: Self-pay

## 2021-10-22 NOTE — ED Triage Notes (Signed)
Before even starting triage, pt says he is leaving to go get something to eat. He was ambulatory through triage doors. Alert and oriented.

## 2021-10-22 NOTE — ED Triage Notes (Signed)
Pt arrives via [facility] from outside of the [employer] where he was rolling around in the grass. Called by a bystander. First time taking meth, normally uses cocaine. VSS en route to ED.

## 2021-10-27 ENCOUNTER — Emergency Department (HOSPITAL_COMMUNITY): Payer: Self-pay

## 2021-10-27 ENCOUNTER — Inpatient Hospital Stay (HOSPITAL_COMMUNITY)
Admission: EM | Admit: 2021-10-27 | Discharge: 2021-10-29 | DRG: 917 | Disposition: A | Discharge status: Home / Self Care | Payer: Self-pay | Attending: Pulmonary Disease | Admitting: Pulmonary Disease

## 2021-10-27 DIAGNOSIS — R401 Stupor: Secondary | ICD-10-CM

## 2021-10-27 DIAGNOSIS — E872 Acidosis, unspecified: Secondary | ICD-10-CM

## 2021-10-27 DIAGNOSIS — F1910 Other psychoactive substance abuse, uncomplicated: Secondary | ICD-10-CM

## 2021-10-27 DIAGNOSIS — J9602 Acute respiratory failure with hypercapnia: Secondary | ICD-10-CM

## 2021-10-27 DIAGNOSIS — J9601 Acute respiratory failure with hypoxia: Secondary | ICD-10-CM

## 2021-10-27 DIAGNOSIS — R4182 Altered mental status, unspecified: Secondary | ICD-10-CM

## 2021-10-27 DIAGNOSIS — N179 Acute kidney failure, unspecified: Secondary | ICD-10-CM

## 2021-10-27 DIAGNOSIS — T43621A Poisoning by amphetamines, accidental (unintentional), initial encounter: Secondary | ICD-10-CM | POA: Diagnosis present

## 2021-10-27 DIAGNOSIS — F1510 Other stimulant abuse, uncomplicated: Secondary | ICD-10-CM | POA: Diagnosis present

## 2021-10-27 DIAGNOSIS — D509 Iron deficiency anemia, unspecified: Secondary | ICD-10-CM | POA: Diagnosis present

## 2021-10-27 DIAGNOSIS — T424X1A Poisoning by benzodiazepines, accidental (unintentional), initial encounter: Secondary | ICD-10-CM | POA: Diagnosis present

## 2021-10-27 DIAGNOSIS — D539 Nutritional anemia, unspecified: Secondary | ICD-10-CM | POA: Diagnosis present

## 2021-10-27 DIAGNOSIS — G9341 Metabolic encephalopathy: Secondary | ICD-10-CM | POA: Diagnosis present

## 2021-10-27 DIAGNOSIS — F1310 Sedative, hypnotic or anxiolytic abuse, uncomplicated: Secondary | ICD-10-CM | POA: Diagnosis present

## 2021-10-27 DIAGNOSIS — Z20822 Contact with and (suspected) exposure to covid-19: Secondary | ICD-10-CM | POA: Diagnosis present

## 2021-10-27 DIAGNOSIS — Z5902 Unsheltered homelessness: Secondary | ICD-10-CM

## 2021-10-27 DIAGNOSIS — T405X1A Poisoning by cocaine, accidental (unintentional), initial encounter: Principal | ICD-10-CM | POA: Diagnosis present

## 2021-10-27 DIAGNOSIS — F1990 Other psychoactive substance use, unspecified, uncomplicated: Secondary | ICD-10-CM | POA: Insufficient documentation

## 2021-10-27 DIAGNOSIS — F1410 Cocaine abuse, uncomplicated: Secondary | ICD-10-CM | POA: Diagnosis present

## 2021-10-27 LAB — I-STAT ARTERIAL BLOOD GAS, ED
Acid-Base Excess: 0 mmol/L (ref 0.0–2.0)
Bicarbonate: 25 mmol/L (ref 20.0–28.0)
Calcium, Ion: 1.15 mmol/L (ref 1.15–1.40)
HCT: 27 % — ABNORMAL LOW (ref 39.0–52.0)
Hemoglobin: 9.2 g/dL — ABNORMAL LOW (ref 13.0–17.0)
O2 Saturation: 100 %
Patient temperature: 95.7
Potassium: 3.7 mmol/L (ref 3.5–5.1)
Sodium: 136 mmol/L (ref 135–145)
TCO2: 26 mmol/L (ref 22–32)
pCO2 arterial: 38 mmHg (ref 32.0–48.0)
pH, Arterial: 7.418 (ref 7.350–7.450)
pO2, Arterial: 306 mmHg — ABNORMAL HIGH (ref 83.0–108.0)

## 2021-10-27 LAB — RESP PANEL BY RT-PCR (FLU A&B, COVID) ARPGX2
Influenza A by PCR: NEGATIVE
Influenza B by PCR: NEGATIVE
SARS Coronavirus 2 by RT PCR: NEGATIVE

## 2021-10-27 LAB — I-STAT CHEM 8, ED
BUN: 9 mg/dL (ref 8–23)
Calcium, Ion: 1.18 mmol/L (ref 1.15–1.40)
Chloride: 101 mmol/L (ref 98–111)
Creatinine, Ser: 1.1 mg/dL (ref 0.61–1.24)
Glucose, Bld: 83 mg/dL (ref 70–99)
HCT: 37 % — ABNORMAL LOW (ref 39.0–52.0)
Hemoglobin: 12.6 g/dL — ABNORMAL LOW (ref 13.0–17.0)
Potassium: 4.1 mmol/L (ref 3.5–5.1)
Sodium: 136 mmol/L (ref 135–145)
TCO2: 25 mmol/L (ref 22–32)

## 2021-10-27 LAB — LIPASE, BLOOD: Lipase: 38 U/L (ref 11–51)

## 2021-10-27 LAB — URINALYSIS, MICROSCOPIC (REFLEX)

## 2021-10-27 LAB — CBC WITH DIFFERENTIAL/PLATELET
Abs Immature Granulocytes: 0.12 10*3/uL — ABNORMAL HIGH (ref 0.00–0.07)
Basophils Absolute: 0.1 10*3/uL (ref 0.0–0.1)
Basophils Relative: 1 %
Eosinophils Absolute: 0.1 10*3/uL (ref 0.0–0.5)
Eosinophils Relative: 1 %
HCT: 36.1 % — ABNORMAL LOW (ref 39.0–52.0)
Hemoglobin: 12.1 g/dL — ABNORMAL LOW (ref 13.0–17.0)
Immature Granulocytes: 1 %
Lymphocytes Relative: 25 %
Lymphs Abs: 2.3 10*3/uL (ref 0.7–4.0)
MCH: 26.7 pg (ref 26.0–34.0)
MCHC: 33.5 g/dL (ref 30.0–36.0)
MCV: 79.7 fL — ABNORMAL LOW (ref 80.0–100.0)
Monocytes Absolute: 0.6 10*3/uL (ref 0.1–1.0)
Monocytes Relative: 6 %
Neutro Abs: 6 10*3/uL (ref 1.7–7.7)
Neutrophils Relative %: 66 %
Platelets: 330 10*3/uL (ref 150–400)
RBC: 4.53 MIL/uL (ref 4.22–5.81)
RDW: 14.4 % (ref 11.5–15.5)
WBC: 9.2 10*3/uL (ref 4.0–10.5)
nRBC: 0 % (ref 0.0–0.2)

## 2021-10-27 LAB — URINALYSIS, ROUTINE W REFLEX MICROSCOPIC
Bilirubin Urine: NEGATIVE
Glucose, UA: NEGATIVE mg/dL
Hgb urine dipstick: NEGATIVE
Ketones, ur: NEGATIVE mg/dL
Leukocytes,Ua: NEGATIVE
Nitrite: NEGATIVE
Protein, ur: 30 mg/dL — AB
Specific Gravity, Urine: 1.02 (ref 1.005–1.030)
pH: 7 (ref 5.0–8.0)

## 2021-10-27 LAB — RAPID URINE DRUG SCREEN, HOSP PERFORMED
Amphetamines: POSITIVE — AB
Barbiturates: NOT DETECTED
Benzodiazepines: POSITIVE — AB
Cocaine: POSITIVE — AB
Opiates: NOT DETECTED
Tetrahydrocannabinol: NOT DETECTED

## 2021-10-27 LAB — I-STAT VENOUS BLOOD GAS, ED
Acid-base deficit: 8 mmol/L — ABNORMAL HIGH (ref 0.0–2.0)
Bicarbonate: 22.7 mmol/L (ref 20.0–28.0)
Calcium, Ion: 1.18 mmol/L (ref 1.15–1.40)
HCT: 37 % — ABNORMAL LOW (ref 39.0–52.0)
Hemoglobin: 12.6 g/dL — ABNORMAL LOW (ref 13.0–17.0)
O2 Saturation: 83 %
Potassium: 4.1 mmol/L (ref 3.5–5.1)
Sodium: 136 mmol/L (ref 135–145)
TCO2: 25 mmol/L (ref 22–32)
pCO2, Ven: 73 mmHg (ref 44.0–60.0)
pH, Ven: 7.101 — CL (ref 7.250–7.430)
pO2, Ven: 66 mmHg — ABNORMAL HIGH (ref 32.0–45.0)

## 2021-10-27 LAB — LACTIC ACID, PLASMA
Lactic Acid, Venous: 7 mmol/L (ref 0.5–1.9)
Lactic Acid, Venous: 7 mmol/L (ref 0.5–1.9)

## 2021-10-27 LAB — SALICYLATE LEVEL: Salicylate Lvl: <7 mg/dL — ABNORMAL LOW (ref 7.0–30.0)

## 2021-10-27 LAB — PROTIME-INR
INR: 1 (ref 0.8–1.2)
Prothrombin Time: 13.2 seconds (ref 11.4–15.2)

## 2021-10-27 LAB — ETHANOL: Alcohol, Ethyl (B): <10 mg/dL (ref <10)

## 2021-10-27 LAB — ACETAMINOPHEN LEVEL: Acetaminophen (Tylenol), Serum: <10 ug/mL — ABNORMAL LOW (ref 10–30)

## 2021-10-27 MED ORDER — HALOPERIDOL LACTATE 5 MG/ML IJ SOLN
5.0000 mg | Freq: Once | INTRAMUSCULAR | Status: AC
  Administered 2021-10-27: 5 mg via INTRAVENOUS
  Filled 2021-10-27: qty 1

## 2021-10-27 MED ORDER — ETOMIDATE 2 MG/ML IV SOLN
INTRAVENOUS | Status: AC
  Filled 2021-10-27: qty 10

## 2021-10-27 MED ORDER — LACTATED RINGERS IV SOLN: INTRAVENOUS | Status: DC

## 2021-10-27 MED ORDER — LACTATED RINGERS IV BOLUS
1000.0000 mL | Freq: Once | INTRAVENOUS | Status: AC
  Administered 2021-10-27: 1000 mL via INTRAVENOUS

## 2021-10-27 MED ORDER — DEXMEDETOMIDINE HCL IN NACL 400 MCG/100ML IV SOLN: 0.4000 ug/kg/h | INTRAVENOUS | Status: DC

## 2021-10-27 MED ORDER — FENTANYL BOLUS VIA INFUSION
50.0000 ug | INTRAVENOUS | Status: DC | PRN
  Administered 2021-10-28 (×3): 100 ug via INTRAVENOUS
  Filled 2021-10-27: qty 100

## 2021-10-27 MED ORDER — DEXMEDETOMIDINE HCL IN NACL 400 MCG/100ML IV SOLN
0.0000 ug/kg/h | INTRAVENOUS | Status: DC
  Administered 2021-10-27: 0.4 ug/kg/h via INTRAVENOUS
  Administered 2021-10-28 (×2): 1.2 ug/kg/h via INTRAVENOUS
  Filled 2021-10-27 (×3): qty 100

## 2021-10-27 MED ORDER — HALOPERIDOL LACTATE 5 MG/ML IJ SOLN
INTRAMUSCULAR | Status: AC
  Administered 2021-10-27: 5 mg via INTRAVENOUS
  Filled 2021-10-27: qty 1

## 2021-10-27 MED ORDER — VANCOMYCIN HCL 1250 MG/250ML IV SOLN
1250.0000 mg | Freq: Once | INTRAVENOUS | Status: AC
  Administered 2021-10-27: 1250 mg via INTRAVENOUS
  Filled 2021-10-27: qty 250

## 2021-10-27 MED ORDER — DOCUSATE SODIUM 100 MG PO CAPS: 100.0000 mg | ORAL_CAPSULE | Freq: Two times a day (BID) | ORAL | Status: DC | PRN

## 2021-10-27 MED ORDER — MIDAZOLAM HCL 2 MG/2ML IJ SOLN
2.0000 mg | INTRAMUSCULAR | Status: DC | PRN
  Administered 2021-10-28: 2 mg via INTRAVENOUS
  Filled 2021-10-27: qty 2

## 2021-10-27 MED ORDER — DOCUSATE SODIUM 50 MG/5ML PO LIQD
100.0000 mg | Freq: Two times a day (BID) | ORAL | Status: DC
  Administered 2021-10-27: 100 mg
  Filled 2021-10-27: qty 10

## 2021-10-27 MED ORDER — ROCURONIUM BROMIDE 10 MG/ML (PF) SYRINGE
PREFILLED_SYRINGE | INTRAVENOUS | Status: AC
  Filled 2021-10-27: qty 10

## 2021-10-27 MED ORDER — POLYETHYLENE GLYCOL 3350 17 G PO PACK: 17.0000 g | PACK | Freq: Every day | ORAL | Status: DC

## 2021-10-27 MED ORDER — VANCOMYCIN HCL IN DEXTROSE 1-5 GM/200ML-% IV SOLN: 1000.0000 mg | Freq: Two times a day (BID) | INTRAVENOUS | Status: DC

## 2021-10-27 MED ORDER — FENTANYL 2500MCG IN NS 250ML (10MCG/ML) PREMIX INFUSION
50.0000 ug/h | INTRAVENOUS | Status: DC
  Administered 2021-10-28: 200 ug/h via INTRAVENOUS
  Filled 2021-10-27: qty 250

## 2021-10-27 MED ORDER — POLYETHYLENE GLYCOL 3350 17 G PO PACK: 17.0000 g | PACK | Freq: Every day | ORAL | Status: DC | PRN

## 2021-10-27 MED ORDER — SODIUM CHLORIDE 0.9 % IV SOLN
2.0000 g | Freq: Three times a day (TID) | INTRAVENOUS | Status: DC
  Administered 2021-10-27 – 2021-10-28 (×2): 2 g via INTRAVENOUS
  Filled 2021-10-27 (×2): qty 2

## 2021-10-27 MED ORDER — HEPARIN SODIUM (PORCINE) 5000 UNIT/ML IJ SOLN
5000.0000 [IU] | Freq: Three times a day (TID) | INTRAMUSCULAR | Status: DC
  Administered 2021-10-27 – 2021-10-28 (×2): 5000 [IU] via SUBCUTANEOUS
  Filled 2021-10-27 (×5): qty 1

## 2021-10-27 MED ORDER — DOCUSATE SODIUM 50 MG/5ML PO LIQD: 100.0000 mg | Freq: Two times a day (BID) | ORAL | Status: DC | PRN

## 2021-10-27 MED ORDER — MIDAZOLAM-SODIUM CHLORIDE 100-0.9 MG/100ML-% IV SOLN
0.5000 mg/h | INTRAVENOUS | Status: DC
  Administered 2021-10-27: 2 mg/h via INTRAVENOUS
  Filled 2021-10-27: qty 100

## 2021-10-27 MED ORDER — MIDAZOLAM HCL 2 MG/2ML IJ SOLN: 2.0000 mg | INTRAMUSCULAR | Status: DC | PRN

## 2021-10-27 MED ORDER — FENTANYL 2500MCG IN NS 250ML (10MCG/ML) PREMIX INFUSION
0.0000 ug/h | INTRAVENOUS | Status: DC
  Administered 2021-10-27: 25 ug/h via INTRAVENOUS
  Filled 2021-10-27: qty 500

## 2021-10-27 MED ORDER — PANTOPRAZOLE SODIUM 40 MG IV SOLR
40.0000 mg | Freq: Every day | INTRAVENOUS | Status: DC
  Administered 2021-10-27: 40 mg via INTRAVENOUS
  Filled 2021-10-27: qty 40

## 2021-10-27 NOTE — ED Provider Notes (Signed)
[facility] EMERGENCY DEPARTMENT  Provider Note  CSN: [id number] Arrival date & time: 10/27/21 1949  History No chief complaint on file.   Lawernce Keas Doe is a 36 y.o. male with unknown name/DOB or PMH brought by EMS after they were called to the scene of a gas station where patient was acting erratically. He was not cooperative with them, flailing about. He was given 5mg  Versed IM and has been somnolent since. He arrived with a NRB in place. Unable to give any other history.    Home Medications Prior to Admission medications   Not on File     Allergies    Patient has no allergy information on record.   Review of Systems   Review of Systems Please see HPI for pertinent positives and negatives  Physical Exam BP 101/67    Pulse 77    Temp (!) 95.9 F (35.5 C)    Resp (!) 24    Ht 6' (1.829 m)    Wt 79.4 kg Comment: ESTIMATED   SpO2 100%    BMI 23.73 kg/m   Physical Exam Vitals and nursing note reviewed.  Constitutional:      General: He is in acute distress.     Appearance: He is diaphoretic.  HENT:     Head: Normocephalic and atraumatic.     Nose: Nose normal.     Mouth/Throat:     Mouth: Mucous membranes are moist.  Eyes:     Conjunctiva/sclera: Conjunctivae normal.     Pupils: Pupils are equal, round, and reactive to light.  Cardiovascular:     Rate and Rhythm: Normal rate.  Pulmonary:     Comments: Sonorous respirations Abdominal:     General: Abdomen is flat. There is no distension.     Palpations: Abdomen is soft.  Musculoskeletal:        General: No swelling. Normal range of motion.     Cervical back: Neck supple.     Right lower leg: No edema.     Left lower leg: No edema.  Skin:    General: Skin is warm.     Comments: Extensive scarring to skin of arms, legs, torso  Neurological:     Comments: unresponsive  Psychiatric:     Comments: Unable to assess    ED Results / Procedures / Treatments   EKG None  Procedures Procedure  Name: Intubation Date/Time: 10/27/2021 8:23 PM Performed by: 12/25/2021, MD Pre-anesthesia Checklist: Patient identified, Patient being monitored, Emergency Drugs available and Suction available Oxygen Delivery Method: Ambu bag Preoxygenation: Pre-oxygenation with 100% oxygen Induction Type: Rapid sequence Ventilation: Mask ventilation without difficulty Laryngoscope Size: Glidescope Tube size: 7.5 mm Number of attempts: 1 Placement Confirmation: ETT inserted through vocal cords under direct vision, CO2 detector and Breath sounds checked- equal and bilateral Secured at: 25 cm Tube secured with: ETT holder    NG placement  Date/Time: 10/27/2021 8:33 PM Performed by: 12/25/2021, MD Authorized by: Pollyann Savoy, MD  Consent: The procedure was performed in an emergent situation. Patient tolerance: patient tolerated the procedure well with no immediate complications   .Critical Care Performed by: Pollyann Savoy, MD Authorized by: Pollyann Savoy, MD   Critical care provider statement:    Critical care time (minutes):  80   Critical care time was exclusive of:  Separately billable procedures and treating other patients   Critical care was necessary to treat or prevent imminent or life-threatening deterioration of  the following conditions:  CNS failure or compromise and respiratory failure   Critical care was time spent personally by me on the following activities:  Development of treatment plan with patient or surrogate, discussions with consultants, evaluation of patient's response to treatment, examination of patient, ordering and review of laboratory studies, ordering and review of radiographic studies, ordering and performing treatments and interventions, pulse oximetry, re-evaluation of patient's condition, review of old charts and gastric intubation   Care discussed with: admitting provider    Medications Ordered in the ED Medications  rocuronium bromide  100 MG/10ML SOSY (has no administration in time range)  etomidate (AMIDATE) 2 MG/ML injection (has no administration in time range)  lactated ringers infusion ( Intravenous New Bag/Given 10/27/21 2152)  polyethylene glycol (MIRALAX / GLYCOLAX) packet 17 g (has no administration in time range)  heparin injection 5,000 Units (has no administration in time range)  pantoprazole (PROTONIX) injection 40 mg (has no administration in time range)  docusate (COLACE) 50 MG/5ML liquid 100 mg (has no administration in time range)  polyethylene glycol (MIRALAX / GLYCOLAX) packet 17 g (has no administration in time range)  fentaNYL in NS (49mcg/ml) infusion-PREMIX (has no administration in time range)  fentaNYL (SUBLIMAZE) bolus via infusion 50-100 mcg (has no administration in time range)  dexmedetomidine (PRECEDEX) 400 MCG/100ML (4 mcg/mL) infusion (0.4 mcg/kg/hr  79.4 kg Intravenous New Bag/Given 10/27/21 2314)  midazolam (VERSED) injection 2 mg (has no administration in time range)  midazolam (VERSED) injection 2 mg (has no administration in time range)  docusate (COLACE) 50 MG/5ML liquid 100 mg (has no administration in time range)  vancomycin (VANCOREADY) IVPB 1250 mg/250 mL (1,250 mg Intravenous New Bag/Given 10/27/21 2308)  ceFEPIme (MAXIPIME) 2 g in sodium chloride 0.9 % 100 mL IVPB (has no administration in time range)  vancomycin (VANCOCIN) IVPB 1000 mg/200 mL premix (has no administration in time range)  lactated ringers bolus 1,000 mL (0 mLs Intravenous Stopped 10/27/21 2142)  haloperidol lactate (HALDOL) injection 5 mg (5 mg Intravenous Given by Other 10/27/21 2220)    Initial Impression and Plan  Patient initially with good SpO2 on NRB but had a brief desat to low 60s, recovered and then dropped again. Not having apnea but there is sonorous respirations. Will proceed with intubation to secure airway and ensure adequate oxygenation. Will check labs, CXR, CT head.   ED Course   Clinical  Course as of 10/27/21 2315  Sat Oct 27, 2021  2031 CBC with normal WBC, mild anemia, unknown baseline. I-stat chem 8 is unremarkable. ABG shows acute respiratory acidosis, likely from decreased respiratory drive.  [CS]  1287 CXR with clear lungs, ETT and NG tube in good position.  [CS]  2102 ABG shows normalization of pH, suspect that his respiratory acidosis was due to over sedation. He is more active now, sitting up, biting his ETT. Will add fentanyl drip to his versed drip to help with sedation. Consult ICU for admission.  [CS]  2109 EtOH, APAP and ASA all neg. Lactic acid is elevated of unclear significance. Does not have any other signs of sepsis.  [CS]  2133 Patient continues to be agitated, trying to sit up. Given additional bolus doses of versed and drips increased with minimal improvement. Will give a dose of Haldol while in CT. Awaiting call back from ICU at this time.  [CS]  2134 UA neg for infection [CS]  2206 Spoke with ICU team who will come evaluate for admission. Better sedation after  Haldol.  [CS]  2221 Patient became markedly agitated, crawling out of bed and trying to pull out his tube. Restrained for safety. Given additional haldol, will switch to Precedex.  [CS]  2223 UDS is positive for cocaine and amphetamines. Benzos may be iatrogenic.  [CS]    Clinical Course User Index [CS] Pollyann SavoySheldon, Mihaela Fajardo B, MD     MDM Rules/Calculators/A&P Medical Decision Making Problems Addressed: Acute respiratory failure with hypercapnia [facility](HCC): acute illness or injury that poses a threat to life or bodily functions Altered mental status, unspecified altered mental status type: acute illness or injury that poses a threat to life or bodily functions Substance abuse [facility](HCC): acute illness or injury that poses a threat to life or bodily functions  Amount and/or Complexity of Data Reviewed Labs: ordered. Decision-making details documented in ED Course. Radiology: ordered and independent  interpretation performed. Decision-making details documented in ED Course. ECG/medicine tests: ordered and independent interpretation performed. Decision-making details documented in ED Course.  Risk Prescription drug management. Parenteral controlled substances. Decision regarding hospitalization. Diagnosis or treatment significantly limited by social determinants of health.    Final Clinical Impression(s) / ED Diagnoses Final diagnoses:  Acute respiratory failure with hypercapnia (HCC)  Substance abuse (HCC)  Altered mental status, unspecified altered mental status type    Rx / DC Orders ED Discharge Orders     None        Pollyann SavoySheldon, Kable Haywood B, MD 10/27/21 2315

## 2021-10-27 NOTE — H&P (Signed)
NAME:  Tristan [name], MRN:  [id number], DOB:  09/23/1875, LOS: 0 ADMISSION DATE:  10/27/2021, CONSULTATION DATE:  2/4 REFERRING MD:  Bernette Mayers, CHIEF COMPLAINT:  AMS   History of Present Illness:  Patient is encephalopathic and/or intubated. Therefore history has been obtained from chart review.   Tristan [name], is a 36 y.o. male, who presented to the [facility] ED after being found altered.   They have a unknown medical history.  Prior report and chart review patient was brought in by EMS after being found altered at a gas station acting erratically. He was given 5mg   IM versed in route by EMS per EDP.  Upon arrival to ED the patient was found unresponsive, on 15L NRB. He was intubated for airway protection. Head CT was negative for acute process. UDS postive for cocaine, benzodiazepines and amphetamines. Initial VBG 7.1/73/66/22. Post intubation ABG 7.41/38/306/25. ETOH: less than 10. Salicylate: less than 7. Apap: less than 10. Lactate acid 7. Patient found with scratches all over body. 2 L IVF given.  PCCM was consulted for admission.  Pertinent  Medical History  Unknown past medical history  Significant Hospital Events: Including procedures, antibiotic start and stop dates in addition to other pertinent events   2/4 presented to [facility]. Intubated. Head CT neg. UDS + for cocaine and amphetamines  Interim History / Subjective:  See above  Versed gtt 10mg  Fentanyl gtt  Unable to obtain subjective evaluation due to patient status.  Objective   Blood pressure 101/67, pulse 77, temperature (!) 95.9 F (35.5 C), resp. rate (!) 24, height 6' (1.829 m), SpO2 100 %.    Vent Mode: PRVC FiO2 (%):  [100 %] 100 % Set Rate:  [24 bmp] 24 bmp Vt Set:  [620 mL] 620 mL PEEP:  [5 cmH20] 5 cmH20 Plateau Pressure:  [18 cmH20] 18 cmH20  No intake or output data in the 24 hours ending 10/27/21 2154 There were no vitals filed for this visit.  Examination: General: In bed, was fighting with ED staff  requiring bilateral restraints and multiple staff to hold down, muscular HEENT: MM pink/moist, anicteric, atraumatic Neuro: RASS -5, sedated CV: S1S2, NSR, no m/r/g appreciated PULM:  clear in the upper lobes, clear in the lower lobes, trachea midline, chest expansion symmetric GI: soft, bsx4 active, non-tender   Extremities: warm/dry, no pretibial edema, capillary refill less than 3 seconds  Skin: generalize abrasions all over body, no rashes or lesions noted  Labs: ETOH: less than 10 Salicylate: less than 7 Apap: less than 10 Creat 1.24 CO2 21, AG 13 HGB 12.1, MCV 79.7 Lactic acid 7 Lipase 38 UA neg UDS postive for cocaine, benzodiazepines and amphetamines Initial VBG 7.1/73/66/22 Post intubation ABG 7.41/38/306/25 CT head: no acute intracranial abnormality Chest xray: stable tube, no evidence of pneumo, infiltrate, or effusion  Resolved Hospital Problem list     Assessment & Plan:  Polysubstance abuse/Drug overdose, UDS postive for cocaine, benzodiazepines and amphetamines Altered mental status, suspect secondary to above Acute agitation, suspect secondary to cocaine and amphetamines Lactic acidosis, suspect secondary to cocaine and amphetamines UDS postive for cocaine, benzodiazepines and amphetamines. Head CT was negative for acute process. ETOH: less than 10. Salicylate: less than 7. Apap: less than 10. Lactate acid 7. S/P 2 L IVF given in ED. UA neg, WBC WNL, CXR with no infectious source. Patient found with scratches all over body. Unknown IVDU? Suspect lactic acidosis and AMS secondary to polysubstance abuse however can not exclude infectious etiology. -Admit  to ICU -Analgesia and sedation as below -Obtain blood cultures, procalcitonin, MRSA pcr, narrow/deescalate abx as result -Start Vanc/Cefepime -Trend lactate -Follow up 12 lead -Monitor neuro status -Follow up AM CBC, BMP -SW consult for assistance in identification of patient. -Substance abuse education when  appropriate  Acute respiratory failure with hypoxia and hypercapnia Secondary to AMS/polysubstance abuse. Initial VBG 7.1/73/66/22. Post intubation ABG 7.41/38/306/25 -LTVV strategy with tidal volumes of 4-8 cc/kg ideal body weight -Goal plateau pressures less than 30 and driving pressures less than 15 -Wean PEEP/FiO2 for SpO2 92-98% -VAP bundle -Wean ventilator in AM -PAD bundle with Precedex gtt, fentanyl gtt, and prn versed. If unable to meet rass goal with prn versed will add drip. -RASS goal -1 to -2 -Follow intermittent CXR and ABG PRN  AKI Creat 1.24 -Ensure renal perfusion. Goal MAP 65 or greater. -Avoid neprotoxic drugs as possible. -Strict I&O's -Follow up AM creatinine -continue foley  Anemia, macrocytic HGB 12.1, MCV 79.7, no signs of bleeding -Transfuse PRBC if HBG less than 7 -Obtain AM CBC to trend H&H -Monitor for signs of bleeding   Best Practice (right click and "Reselect all SmartList Selections" daily)   Diet/type: NPO DVT prophylaxis: prophylactic heparin  GI prophylaxis: PPI Lines: N/A Foley:  Yes, and it is still needed Code Status:  full code Last date of multidisciplinary goals of care discussion [Pending]  Labs   CBC: Recent Labs  Lab 10/27/21 2004 10/27/21 2012 10/27/21 2050  WBC  --  9.2  --   NEUTROABS  --  6.0  --   HGB 12.6*   12.6* 12.1* 9.2*  HCT 37.0*   37.0* 36.1* 27.0*  MCV  --  79.7*  --   PLT  --  330  --     Basic Metabolic Panel: Recent Labs  Lab 10/27/21 2004 10/27/21 2012 10/27/21 2050  NA 136   136 137 136  K 4.1   4.1 4.2 3.7  CL 101 103  --   CO2  --  21*  --   GLUCOSE 83 89  --   BUN 9 9  --   CREATININE 1.10 1.24  --   CALCIUM  --  8.5*  --    GFR: CrCl cannot be calculated (Unknown ideal weight.). Recent Labs  Lab 10/27/21 2012  WBC 9.2  LATICACIDVEN 7.0*    Liver Function Tests: Recent Labs  Lab 10/27/21 2012  AST 34  ALT 24  ALKPHOS 44  BILITOT 0.5  PROT 5.4*  ALBUMIN 3.2*    Recent Labs  Lab 10/27/21 2012  LIPASE 38   No results for input(s): AMMONIA in the last 168 hours.  ABG    Component Value Date/Time   PHART 7.418 10/27/2021 2050   PCO2ART 38.0 10/27/2021 2050   PO2ART 306 (H) 10/27/2021 2050   HCO3 25.0 10/27/2021 2050   TCO2 26 10/27/2021 2050   ACIDBASEDEF 8.0 (H) 10/27/2021 2004   O2SAT 100.0 10/27/2021 2050     Coagulation Profile: Recent Labs  Lab 10/27/21 2012  INR 1.0    Cardiac Enzymes: No results for input(s): CKTOTAL, CKMB, CKMBINDEX, TROPONINI in the last 168 hours.  HbA1C: No results found for: HGBA1C  CBG: No results for input(s): GLUCAP in the last 168 hours.  Review of Systems:   Unable to obtain ROS due to patient status  Past Medical History:  He,  has no past medical history on file.   Surgical History:   Unable to obtain  Social History:  Unable to obtain  Family History:  His family history is not on file.   Allergies Not on File   Home Medications  Prior to Admission medications   Not on File     Critical care time: 42 minutes    Gershon Mussel., MSN, APRN, AGACNP-BC [facility]  10/27/2021 , 11:21 PM  Please see [email or web address] for pager details  If no response, please call [phone] After hours, please call Elink at [phone]

## 2021-10-27 NOTE — Sepsis Progress Note (Addendum)
Elink following code sepsis °

## 2021-10-27 NOTE — Progress Notes (Signed)
Patient transported ED Room 26 to CT and back with no complications noted.

## 2021-10-27 NOTE — Progress Notes (Signed)
Pharmacy Antibiotic Note  Tristan [name] is a 36 y.o. male admitted on 10/27/2021 with sepsis.  Pharmacy has been consulted for vancomycin and cefepime dosing.  Unable to calculate ClCr or vanc AUC due to unknown age. Scr is 1.24. Elevated lactic acid, wbc wnl, CXR clear, UA unremarkable.  Plan: Cefepime 2g q 8 hr Vancomycin 1250 mg x1 then 1000 mg q 12 hr Monitor cultures, clinical status, renal function, vancomycin level Narrow abx as able and f/u duration  F/u age and recalculate as able    Height: 6' (182.9 cm) Weight: 79.4 kg (175 lb) (ESTIMATED) IBW/kg (Calculated) : 77.6  Temp (24hrs), Avg:95.6 F (35.3 C), Min:95.4 F (35.2 C), Max:95.9 F (35.5 C)  Recent Labs  Lab 10/27/21 2004 10/27/21 2012  WBC  --  9.2  CREATININE 1.10 1.24  LATICACIDVEN  --  7.0*    Estimated Creatinine Clearance: -5.2 mL/min (by C-G formula based on SCr of 1.24 mg/dL).    Not on File  Antimicrobials this admission: Cefepime  2/4 >>  Vanc 2/4 >>   Dose adjustments this admission:  N/a  Microbiology results: 2/4 BCx: pending 2/4 UCx: pending   2/4 MRSA PCR: pending  Thank you for allowing pharmacy to be a part of this patients care.  Alphia Moh, PharmD, BCPS, BCCP Clinical Pharmacist  Please check AMION for all [facility] Pharmacy phone numbers After 10:00 PM, call Main Pharmacy [phone]

## 2021-10-27 NOTE — ED Triage Notes (Signed)
Pt brought to ED by [facility] via stretcher with c/o AMS and possible drug use after being found by bystander "flapping" on sidewalk and exhibiting other odd behaviors. Per EMS pt administered Versed 5MG  IM to help cal delirium. Upon arrival to ED, pt presents as unresponsive, diaphoretic connected to 15L via non-rebreather, EMS denies any Narcan administration PTA.

## 2021-10-28 LAB — BASIC METABOLIC PANEL
Anion gap: 7 (ref 5–15)
BUN: 10 mg/dL (ref 6–20)
CO2: 23 mmol/L (ref 22–32)
Calcium: 8.3 mg/dL — ABNORMAL LOW (ref 8.9–10.3)
Chloride: 106 mmol/L (ref 98–111)
Creatinine, Ser: 0.93 mg/dL (ref 0.61–1.24)
GFR, Estimated: 55 mL/min — ABNORMAL LOW (ref >60)
Glucose, Bld: 87 mg/dL (ref 70–99)
Potassium: 3.5 mmol/L (ref 3.5–5.1)
Sodium: 136 mmol/L (ref 135–145)

## 2021-10-28 LAB — POCT I-STAT, CHEM 8
BUN: 9 mg/dL (ref 6–20)
Calcium, Ion: 1.18 mmol/L (ref 1.15–1.40)
Chloride: 101 mmol/L (ref 98–111)
Creatinine, Ser: 1.1 mg/dL (ref 0.61–1.24)
Glucose, Bld: 83 mg/dL (ref 70–99)
HCT: 37 % — ABNORMAL LOW (ref 39.0–52.0)
Hemoglobin: 12.6 g/dL — ABNORMAL LOW (ref 13.0–17.0)
Potassium: 4.1 mmol/L (ref 3.5–5.1)
Sodium: 136 mmol/L (ref 135–145)
TCO2: 25 mmol/L (ref 22–32)

## 2021-10-28 LAB — COMPREHENSIVE METABOLIC PANEL
ALT: 24 U/L (ref 0–44)
AST: 34 U/L (ref 15–41)
Albumin: 3.2 g/dL — ABNORMAL LOW (ref 3.5–5.0)
Alkaline Phosphatase: 44 U/L (ref 38–126)
Anion gap: 13 (ref 5–15)
BUN: 9 mg/dL (ref 6–20)
CO2: 21 mmol/L — ABNORMAL LOW (ref 22–32)
Calcium: 8.5 mg/dL — ABNORMAL LOW (ref 8.9–10.3)
Chloride: 103 mmol/L (ref 98–111)
Creatinine, Ser: 1.24 mg/dL (ref 0.61–1.24)
GFR, Estimated: 39 mL/min — ABNORMAL LOW (ref >60)
Glucose, Bld: 89 mg/dL (ref 70–99)
Potassium: 4.2 mmol/L (ref 3.5–5.1)
Sodium: 137 mmol/L (ref 135–145)
Total Bilirubin: 0.5 mg/dL (ref 0.3–1.2)
Total Protein: 5.4 g/dL — ABNORMAL LOW (ref 6.5–8.1)

## 2021-10-28 LAB — PHOSPHORUS: Phosphorus: 3.5 mg/dL (ref 2.5–4.6)

## 2021-10-28 LAB — CK: Total CK: 578 U/L — ABNORMAL HIGH (ref 49–397)

## 2021-10-28 LAB — CBC
HCT: 32.7 % — ABNORMAL LOW (ref 39.0–52.0)
Hemoglobin: 11.6 g/dL — ABNORMAL LOW (ref 13.0–17.0)
MCH: 26.5 pg (ref 26.0–34.0)
MCHC: 35.5 g/dL (ref 30.0–36.0)
MCV: 74.7 fL — ABNORMAL LOW (ref 80.0–100.0)
Platelets: 278 10*3/uL (ref 150–400)
RBC: 4.38 MIL/uL (ref 4.22–5.81)
RDW: 14 % (ref 11.5–15.5)
WBC: 9.9 10*3/uL (ref 4.0–10.5)
nRBC: 0 % (ref 0.0–0.2)

## 2021-10-28 LAB — GLUCOSE, CAPILLARY
Glucose-Capillary: 74 mg/dL (ref 70–99)
Glucose-Capillary: 84 mg/dL (ref 70–99)
Glucose-Capillary: 86 mg/dL (ref 70–99)
Glucose-Capillary: 81 mg/dL (ref 70–99)
Glucose-Capillary: 148 mg/dL — ABNORMAL HIGH (ref 70–99)

## 2021-10-28 LAB — MAGNESIUM: Magnesium: 2.1 mg/dL (ref 1.7–2.4)

## 2021-10-28 LAB — TYPE AND SCREEN
ABO/RH(D): O POS
Antibody Screen: NEGATIVE

## 2021-10-28 LAB — MRSA NEXT GEN BY PCR, NASAL: MRSA by PCR Next Gen: NOT DETECTED

## 2021-10-28 LAB — LACTIC ACID, PLASMA: Lactic Acid, Venous: 1.1 mmol/L (ref 0.5–1.9)

## 2021-10-28 LAB — PROCALCITONIN: Procalcitonin: <0.1 ng/mL

## 2021-10-28 LAB — ABO/RH: ABO/RH(D): O POS

## 2021-10-28 MED ORDER — AMOXICILLIN-POT CLAVULANATE 875-125 MG PO TABS
1.0000 | ORAL_TABLET | Freq: Two times a day (BID) | ORAL | Status: DC
  Administered 2021-10-28 – 2021-10-29 (×3): 1 via ORAL
  Filled 2021-10-28 (×4): qty 1

## 2021-10-28 MED ORDER — DOCUSATE SODIUM 100 MG PO CAPS
100.0000 mg | ORAL_CAPSULE | Freq: Two times a day (BID) | ORAL | Status: DC
  Administered 2021-10-28: 100 mg via ORAL
  Filled 2021-10-28 (×2): qty 1

## 2021-10-28 MED ORDER — HYDROXYZINE HCL 25 MG PO TABS: 25.0000 mg | ORAL_TABLET | Freq: Four times a day (QID) | ORAL | Status: DC | PRN

## 2021-10-28 MED ORDER — FLUOXETINE HCL 10 MG PO CAPS
10.0000 mg | ORAL_CAPSULE | Freq: Every day | ORAL | Status: DC
  Administered 2021-10-28: 10 mg via ORAL
  Filled 2021-10-28 (×2): qty 1

## 2021-10-28 MED ORDER — PANTOPRAZOLE SODIUM 40 MG PO TBEC
40.0000 mg | DELAYED_RELEASE_TABLET | Freq: Every day | ORAL | Status: DC
  Administered 2021-10-28: 40 mg via ORAL
  Filled 2021-10-28: qty 1

## 2021-10-28 MED ORDER — QUETIAPINE FUMARATE 50 MG PO TABS
50.0000 mg | ORAL_TABLET | Freq: Every day | ORAL | Status: DC
  Administered 2021-10-28: 50 mg via ORAL
  Filled 2021-10-28: qty 1

## 2021-10-28 MED ORDER — SODIUM CHLORIDE 0.9 % IV SOLN: Freq: Once | INTRAVENOUS | Status: DC

## 2021-10-28 MED ORDER — DEXTROSE-NACL 5-0.45 % IV SOLN: INTRAVENOUS | Status: DC

## 2021-10-28 MED ORDER — POLYETHYLENE GLYCOL 3350 17 G PO PACK: 17.0000 g | PACK | Freq: Every day | ORAL | Status: DC

## 2021-10-28 MED ORDER — CHLORHEXIDINE GLUCONATE CLOTH 2 % EX PADS
6.0000 | MEDICATED_PAD | Freq: Every day | CUTANEOUS | Status: DC
Start: 2021-10-28 — End: 2021-10-29
  Administered 2021-10-28: 6 via TOPICAL

## 2021-10-28 NOTE — Progress Notes (Signed)
[facility]  The patient gave [place] his name, DOB History reviewed, long history of narcotic abuse documented in his chart which will be merged Takes prozac, seroquel, atarax at home, will restart  Heber Casey, MD [place] [facility] Pager: [phone] Cell: [phone] After 7:00 pm call Elink  [phone]

## 2021-10-28 NOTE — Progress Notes (Signed)
Patient transported from ED RM26 to [id number] with no complications.

## 2021-10-28 NOTE — Progress Notes (Signed)
NAME:  Tristan [name], MRN:  [id number], DOB:  09/23/1875, LOS: 1 ADMISSION DATE:  10/27/2021, CONSULTATION DATE:  2/4 REFERRING MD:  Bernette Mayers, CHIEF COMPLAINT:  AMS   History of Present Illness:  Patient is encephalopathic and/or intubated. Therefore history has been obtained from chart review.   Tristan [name], is a 36 y.o. male, who presented to the [facility] ED after being found altered. They have a unknown medical history. Prior report and chart review patient was brought in by EMS after being found altered at a gas station acting erratically. He was given 5mg   IM versed in route by EMS per EDP. Upon arrival to ED the patient was found unresponsive, on 15L NRB. He was intubated for airway protection. Head CT was negative for acute process. UDS postive for cocaine, benzodiazepines and amphetamines. Initial VBG 7.1/73/66/22. Post intubation ABG 7.41/38/306/25. ETOH: less than 10. Salicylate: less than 7. Apap: less than 10. Lactate acid 7. Patient found with scratches all over body. 2 L IVF given. PCCM was consulted for admission.  Pertinent  Medical History  Unknown past medical history  Significant Hospital Events: Including procedures, antibiotic start and stop dates in addition to other pertinent events   2/4 presented to [facility]. Intubated. Head CT neg. UDS + for cocaine and amphetamines  Interim History / Subjective:   Awake following commands  Intubated on life support   Objective   Blood pressure (!) 121/93, pulse 66, temperature (!) 96.8 F (36 C), temperature source Bladder, resp. rate (!) 24, height 6' (1.829 m), weight 86.2 kg, SpO2 100 %.    Vent Mode: PRVC FiO2 (%):  [40 %-100 %] 40 % Set Rate:  [24 bmp] 24 bmp Vt Set:  [birth year]1985 mL] 620 mL PEEP:  [5 cmH20] 5 cmH20 Plateau Pressure:  [18 cmH20] 18 cmH20   Intake/Output Summary (Last 24 hours) at 10/28/2021 12/26/2021 Last data filed at 10/28/2021 12/26/2021 Gross per 24 hour  Intake 1917.59 ml  Output 1885 ml  Net 32.59 ml   Filed Weights    10/27/21 2200 10/28/21 0100  Weight: 79.4 kg 86.2 kg    Examination: General: young male, intubated on life support HEENT: tracking, NCAT  Neuro: alert following commands, writing on white board CV: RRR, s1 s2  PULM:  BL vented breaths  GI: soft nt nd  Extremities: BL LE edema  Skin: no rash   Labs: reviewed   Resolved Hospital Problem list     Assessment & Plan:   Polysubstance abuse/Drug overdose, UDS postive for cocaine, benzodiazepines and amphetamines Altered mental status, suspect secondary to above Acute agitation, suspect secondary to cocaine and amphetamines Lactic acidosis, suspect secondary to cocaine and amphetamines P: Support care until mental status improves Precedex for agitiation  Once extubated he can hopefully tell 12/26/21 who he is  Acute respiratory failure with hypoxia and hypercapnia Secondary to AMS/polysubstance abuse. Initial VBG 7.1/73/66/22. Post intubation ABG 7.41/38/306/25 P: Adult vent protocol  Wean as tolerated SAT SBT this am Hopeful for extubation   AKI Creat 1.24 P: Follow UOP  Avoid nephrotoxic agents   Anemia, macrocytic HGB 12.1, MCV 79.7, no signs of bleeding P: Follow no signs of bleeding   Best Practice (right click and "Reselect all SmartList Selections" daily)   Diet/type: NPO DVT prophylaxis: prophylactic heparin  GI prophylaxis: PPI Lines: N/A Foley:  Yes, and it is still needed Code Status:  full code Last date of multidisciplinary goals of care discussion [Pending]  Labs   CBC: Recent  Labs  Lab 10/27/21 2004 10/27/21 2012 10/27/21 2050 10/28/21 0148  WBC  --  9.2  --  9.9  NEUTROABS  --  6.0  --   --   HGB 12.6*   12.6* 12.1* 9.2* 11.6*  HCT 37.0*   37.0* 36.1* 27.0* 32.7*  MCV  --  79.7*  --  74.7*  PLT  --  330  --  278    Basic Metabolic Panel: Recent Labs  Lab 10/27/21 2004 10/27/21 2012 10/27/21 2050 10/28/21 0148  NA 136   136 137 136 136  K 4.1   4.1 4.2 3.7 3.5  CL 101 103  --  106   CO2  --  21*  --  23  GLUCOSE 83 89  --  87  BUN 9 9  --  10  CREATININE 1.10 1.24  --  0.93  CALCIUM  --  8.5*  --  8.3*  MG  --   --   --  2.1  PHOS  --   --   --  3.5   GFR: Estimated Creatinine Clearance: -7 mL/min (by C-G formula based on SCr of 0.93 mg/dL). Recent Labs  Lab 10/27/21 0018 10/27/21 2012 10/27/21 2205 10/28/21 0148  PROCALCITON <0.10  --   --   --   WBC  --  9.2  --  9.9  LATICACIDVEN  --  7.0* 7.0* 1.1    Liver Function Tests: Recent Labs  Lab 10/27/21 2012  AST 34  ALT 24  ALKPHOS 44  BILITOT 0.5  PROT 5.4*  ALBUMIN 3.2*   Recent Labs  Lab 10/27/21 2012  LIPASE 38   No results for input(s): AMMONIA in the last 168 hours.  ABG    Component Value Date/Time   PHART 7.418 10/27/2021 2050   PCO2ART 38.0 10/27/2021 2050   PO2ART 306 (H) 10/27/2021 2050   HCO3 25.0 10/27/2021 2050   TCO2 26 10/27/2021 2050   ACIDBASEDEF 8.0 (H) 10/27/2021 2004   O2SAT 100.0 10/27/2021 2050     Coagulation Profile: Recent Labs  Lab 10/27/21 2012  INR 1.0    Cardiac Enzymes: Recent Labs  Lab 10/28/21 0148  CKTOTAL 578*    HbA1C: No results found for: HGBA1C  CBG: Recent Labs  Lab 10/28/21 0102 10/28/21 0348  GLUCAP 74 84    Review of Systems:   Unable to obtain ROS due to patient status  Past Medical History:  He,  has no past medical history on file.   Surgical History:   Unable to obtain  Social History:    Unable to obtain  Family History:  His family history is not on file.   Allergies Not on File   Home Medications  Prior to Admission medications   Not on File     This patient is critically ill with multiple organ system failure; which, requires frequent high complexity decision making, assessment, support, evaluation, and titration of therapies. This was completed through the application of advanced monitoring technologies and extensive interpretation of multiple databases. During this encounter critical care time  was devoted to patient care services described in this note for 32 minutes.   Josephine Igo, DO [facility] 10/28/2021 7:06 AM

## 2021-10-28 NOTE — Procedures (Signed)
Extubation Procedure Note  Patient Details:   Name: Tristan [name] DOB: 09/23/1875 MRN: [id number]   Airway Documentation:    Vent end date: (not recorded) Vent end time: (not recorded)   Evaluation  O2 sats: stable throughout Complications: No apparent complications Patient did tolerate procedure well. Bilateral Breath Sounds: Clear, Diminished   Yes, patient able to speak.  Patient extubated per MD order at 1027 and placed on 4L nasal cannula. Patient tolerated well. All vitals stable.  Farris Has 10/28/2021, 10:38 AM

## 2021-10-28 NOTE — Progress Notes (Signed)
eLink Physician-Brief Progress Note Patient Name: Tristan [name] DOB: 09/23/1875 MRN: [id number]   Date of Service  10/28/2021  HPI/Events of Note  Patient admitted via EMS and the ED secondary to altered mental status progressing to acute hypoxemic / hypercapnic respiratory failure in the context of suspected polysubstance intoxication (cocaine + amphetamines), he is intubated, sedated, and mechanically ventilated.  eICU Interventions  New Patient Evaluation.        Thomasene Lot Sahil Milner 10/28/2021, 1:18 AM

## 2021-10-28 NOTE — Sepsis Progress Note (Signed)
Notified provider of need to order repeat lactic acid. Was given verbal orders by Duwayne Heck, MD to order lactic acid per sepsis protocol. Will continue to monitor code sepsis.

## 2021-10-29 LAB — CBC
HCT: 32.3 % — ABNORMAL LOW (ref 39.0–52.0)
Hemoglobin: 11.7 g/dL — ABNORMAL LOW (ref 13.0–17.0)
MCH: 27.5 pg (ref 26.0–34.0)
MCHC: 36.2 g/dL — ABNORMAL HIGH (ref 30.0–36.0)
MCV: 75.8 fL — ABNORMAL LOW (ref 80.0–100.0)
Platelets: 298 10*3/uL (ref 150–400)
RBC: 4.26 MIL/uL (ref 4.22–5.81)
RDW: 14.5 % (ref 11.5–15.5)
WBC: 10.2 10*3/uL (ref 4.0–10.5)
nRBC: 0 % (ref 0.0–0.2)

## 2021-10-29 LAB — BASIC METABOLIC PANEL
Anion gap: 7 (ref 5–15)
BUN: 8 mg/dL (ref 6–20)
CO2: 25 mmol/L (ref 22–32)
Calcium: 7.9 mg/dL — ABNORMAL LOW (ref 8.9–10.3)
Chloride: 104 mmol/L (ref 98–111)
Creatinine, Ser: 1.05 mg/dL (ref 0.61–1.24)
GFR, Estimated: >60 mL/min (ref >60)
Glucose, Bld: 129 mg/dL — ABNORMAL HIGH (ref 70–99)
Potassium: 3.5 mmol/L (ref 3.5–5.1)
Sodium: 136 mmol/L (ref 135–145)

## 2021-10-29 LAB — FOLATE: Folate: 6.9 ng/mL (ref >5.9)

## 2021-10-29 LAB — PROCALCITONIN: Procalcitonin: <0.1 ng/mL

## 2021-10-29 MED ORDER — ENSURE ENLIVE PO LIQD: 237.0000 mL | Freq: Three times a day (TID) | ORAL | Status: DC

## 2021-10-29 NOTE — Discharge Summary (Addendum)
Physician Discharge Summary         Patient ID: Tristan [name] MRN: [id number] DOB/AGE: Tristan [name] 36 y.o.  Admit date: 10/27/2021 Discharge date: 10/29/2021  Discharge Diagnoses:    Polysubstance abuse/ Drug overdose Acute metabolic encephalopathy  Acute hypoxic and hypercapnic respiratory failure  AKI  Microcytic anemia   Discharge summary    Patient presented as unknown male patient to the [facility] ED after being found altered. They have a unknown medical history. Prior report and chart review patient was brought in by EMS after being found altered at a gas station acting erratically. He was given 5mg   IM versed in route by EMS per EDP. Upon arrival to ED the patient was found unresponsive, on 15L NRB. He was intubated for airway protection. Head CT was negative for acute process. UDS postive for cocaine, benzodiazepines and amphetamines. Initial VBG 7.1/73/66/22. Post intubation ABG 7.41/38/306/25. ETOH: less than 10. Salicylate: less than 7. Apap: less than 10. Lactate acid 7. Patient found with scratches all over body. 2 L IVF given. PCCM was consulted for admission.  He was empirically cultured and started on antibiotics on admit, which were stopped 2/5 given patient remained afebrile, no obvious source, no growth on culture data, and reassuring PCT.  He was able to be extubated on 2/5.  He was then able to provide his name and date of birth.  Notes frequent visit to ER due to polysubstance abuse on pending merging chart.  On 2/6, he was hemodynamically and neurologically stable to be discharged home.  However patient is homeless.  TOC was consulted for help with substance abuse, homelessness, and hoping to get patient established at our [facility] center, however patient wanted to leave prior to that happening or else leaving AMA.     Discharge Plan by Active Problems    Polysubstance abuse/Drug overdose, UDS postive for cocaine, benzodiazepines and amphetamines Altered mental  status, suspect secondary to above Acute agitation, suspect secondary to cocaine and amphetamines, resolved Lactic acidosis, suspect secondary to cocaine and amphetamines, resolved P: Patient back to baseline TOC consulted to help with substance abuse counseling/ help  Verified, patient is not taking prozac, seroquel or atarax.  Hx of Biopolar in chart, prozac can induce mania.  Will hold all these meds as he is not taking.     Acute respiratory failure with hypoxia and hypercapnia Secondary to AMS/polysubstance abuse.  P: Extubated 2/5 No issues, currently on room air.  Continue general pulmonary hygiene     AKI Creat 1.24 P: Resolved, sCr 1.05 and great UOP     Anemia, macrocytic HGB 12.1, MCV 79.7, no signs of bleeding P: Follow no signs of bleeding  Can follow-up outpatient for further anemia workup    Significant Hospital tests/ studies  2/4 CXR > no acute abnormality  2/4 CTH > No evidence of acute intracranial abnormality.  Procedures    Culture data/antimicrobials    2/4 vanc/ cefepime 2/5 augmentin   2/4 SARS/ flu > neg 2/5 MRSA PCR > neg 2/4 Bcx2 > ngtd    Consults  TOC    Discharge Exam: BP 117/72    Pulse 81    Temp 97.9 F (36.6 C) (Oral)    Resp (!) 21    Ht 6' (1.829 m)    Wt 86.2 kg    SpO2 96%    BMI 25.77 kg/m   General:  Adult male lying in bed in NAD.  Currently no complaints HEENT: MM pink/moist, pupils 5/reactive Neuro:  Aox3, MAE CV: rr, NSR PULM:  non labored, CTA, on room air GI: soft, bs+, NT, voiding Extremities: warm/dry, no LE edema  Skin: scabbed/ healing scratches over body, multiple tattoos   Labs at discharge   Lab Results  Component Value Date   CREATININE 1.05 10/29/2021   BUN 8 10/29/2021   NA 136 10/29/2021   K 3.5 10/29/2021   CL 104 10/29/2021   CO2 25 10/29/2021   Lab Results  Component Value Date   WBC 10.2 10/29/2021   HGB 11.7 (L) 10/29/2021   HCT 32.3 (L) 10/29/2021   MCV 75.8 (L) 10/29/2021    PLT 298 10/29/2021   Lab Results  Component Value Date   ALT 24 10/27/2021   AST 34 10/27/2021   ALKPHOS 44 10/27/2021   BILITOT 0.5 10/27/2021   Lab Results  Component Value Date   INR 1.0 10/27/2021    Current radiological studies    CT Head Wo Contrast  Result Date: 10/27/2021 CLINICAL DATA:  Altered mental status. EXAM: CT HEAD WITHOUT CONTRAST TECHNIQUE: Contiguous axial images were obtained from the base of the skull through the vertex without intravenous contrast. RADIATION DOSE REDUCTION: This exam was performed according to the departmental dose-optimization program which includes automated exposure control, adjustment of the mA and/or kV according to patient size and/or use of iterative reconstruction technique. COMPARISON:  None. FINDINGS: Brain: No evidence of acute infarction, hemorrhage, hydrocephalus, extra-axial collection or mass lesion/mass effect. Vascular: No hyperdense vessel or unexpected calcification. Skull: Normal. Negative for fracture or focal lesion. Sinuses/Orbits: No acute finding. Other: None. IMPRESSION: No evidence of acute intracranial abnormality. Electronically Signed   By: Harmon Pier M.D.   On: 10/27/2021 21:50   DG Chest Portable 1 View  Result Date: 10/27/2021 CLINICAL DATA:  Post intubation.  Possible drug use.  Unresponsive. EXAM: PORTABLE CHEST 1 VIEW COMPARISON:  None. FINDINGS: An endotracheal tube has been placed with tip measuring 5.2 cm above the carina. An enteric tube has been placed. The tip is off the field of view but is below the left hemidiaphragm. Normal heart size and pulmonary vascularity. No focal airspace disease or consolidation in the lungs. No blunting of costophrenic angles. No pneumothorax. Mediastinal contours appear intact. IMPRESSION: Appliances appear in satisfactory position.  Lungs are clear. Electronically Signed   By: Burman Nieves M.D.   On: 10/27/2021 20:19    Disposition:    He is homeless but did not want to want  for TOC.     Allergies as of 10/29/2021   No Known Allergies      Medication List    You have not been prescribed any medications.      Follow-up appointment     Discharge Condition:    stable    Time spent in discharge: 30 mins   Posey Boyer, ACNP [facility] 10/29/2021, 12:55 PM

## 2021-10-29 NOTE — TOC Initial Note (Signed)
Transition of Care [facility]) - Initial/Assessment Note    Patient Details  Name: Tristan [name] MRN: [id number] Date of Birth: [birth year]  [facility] CM/SW Contact:    Milinda Antis, [place] Phone Number: 10/29/2021, 12:53 PM  Clinical Narrative:                 CSW met with the patient at bedside.  The patient reports that he does not have anywhere to stay currently and is living on the streets.  CSW informed the patient of the lack of shelter availability, but informed him about [employer].  The patient stated that he would be open to going.  CSW contacted [employer] and they will have somewhere for the patient to sleep as long as he is there for an interview before 9pm.          Patient Goals and CMS Choice        Expected Discharge Plan and Services                                                Prior Living Arrangements/Services                       Activities of Daily Living      Permission Sought/Granted                  Emotional Assessment              Admission diagnosis:  Substance abuse ([place]) [F19.10] Altered mental status [R41.82] Acute respiratory failure with hypercapnia ([place]) [J96.02] Altered mental status, unspecified altered mental status type [R41.82] Patient Active Problem List   Diagnosis Date Noted   Altered mental status 10/27/2021   Polysubstance abuse ([place])    Lactic acidosis    Acute respiratory failure with hypoxia and hypercapnia ([place])    AKI (acute kidney injury) ([place])    PCP:  Pcp, No Pharmacy:   [id number] [place] [id number]: [phone]: [phone]    Social Determinants of Health (SDOH) Interventions    Readmission Risk Interventions No flowsheet data found.

## 2021-10-29 NOTE — Evaluation (Signed)
Occupational Therapy Evaluation/Discharge Patient Details Name: Tristan [name] MRN: [id number] DOB: 1986/09/05 Today's Date: 10/29/2021   History of Present Illness Pt is a 36 y/o male who presented with AMS and erratic behavior at a gas station. Pt given Versed in route to ED. On arrival, pt unresponsive with acute respiratory failure, required intubation. UDS positive for cocaine, benzodiazepines and amphetamines. Pt extubated 2/5. PMH: polysubstance abuse   Clinical Impression   PTA, pt reports either staying in hotels vs staying with parent. Pt reports complete Independence in all daily tasks. Pt reports feeling back to baseline and able to complete ADLs/mobility Independently. Pt declined hallway mobility this AM. Pt denies lightheadedness, pain or concerns about managing daily tasks at discharge. No further skilled OT services needed at acute level or on DC. OT to sign off. VSS on RA.     Recommendations for follow up therapy are one component of a multi-disciplinary discharge planning process, led by the attending physician.  Recommendations may be updated based on patient status, additional functional criteria and insurance authorization.   Follow Up Recommendations  No OT follow up    Assistance Recommended at Discharge None  Patient can return home with the following      Functional Status Assessment  Patient has not had a recent decline in their functional status  Equipment Recommendations  None recommended by OT    Recommendations for Other Services       Precautions / Restrictions Precautions Precautions: None Restrictions Weight Bearing Restrictions: No      Mobility Bed Mobility Overal bed mobility: Modified Independent                  Transfers Overall transfer level: Independent Equipment used: None                      Balance Overall balance assessment: No apparent balance deficits (not formally assessed)                                          ADL either performed or assessed with clinical judgement   ADL Overall ADL's : Independent                                             Vision Ability to See in Adequate Light: 0 Adequate Vision Assessment?: No apparent visual deficits     Perception     Praxis      Pertinent Vitals/Pain Pain Assessment Pain Assessment: No/denies pain     Hand Dominance Right   Extremity/Trunk Assessment Upper Extremity Assessment Upper Extremity Assessment: Overall WFL for tasks assessed   Lower Extremity Assessment Lower Extremity Assessment: Overall WFL for tasks assessed   Cervical / Trunk Assessment Cervical / Trunk Assessment: Normal   Communication Communication Communication: No difficulties   Cognition Arousal/Alertness: Awake/alert Behavior During Therapy: WFL for tasks assessed/performed Overall Cognitive Status: Within Functional Limits for tasks assessed                                       General Comments  VSS on RA. 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        OT Problem List:        OT Treatment/Interventions:      OT Goals(Current goals can be found in the care plan section) Acute Rehab OT Goals Patient Stated Goal: get discharged today OT Goal Formulation: All assessment and education complete, DC therapy  OT Frequency:      Co-evaluation               AM-PAC OT "6 Clicks" Daily Activity     Outcome Measure Help from another person eating meals?: None Help from another person taking care of personal grooming?: None Help from another person toileting, which includes using toliet, bedpan, or urinal?: None Help from another person bathing (including washing, rinsing, drying)?: None Help from another person to put on and taking off regular upper body clothing?: None Help from another person to put on and taking off regular lower body clothing?: None 6 Click Score: 24   End of Session Nurse Communication: Mobility status  Activity Tolerance: Patient tolerated treatment well Patient left: in bed;with call bell/phone within reach  OT Visit Diagnosis: Other abnormalities of gait and mobility (R26.89)                Time: 4098-1191 OT Time Calculation (min): 13 min Charges:  OT General Charges $OT Visit: 1 Visit OT Evaluation $OT Eval Low Complexity: 1 Low  Bradd Canary, OTR/L Acute Rehab Services Office: [phone]   Lorre Munroe 10/29/2021, 9:45 AM

## 2021-10-29 NOTE — Progress Notes (Signed)
Initial Nutrition Assessment  DOCUMENTATION CODES:   Not applicable  INTERVENTION:   - Ensure Enlive po TID, each supplement provides 350 kcal and 20 grams of protein  NUTRITION DIAGNOSIS:   Increased nutrient needs related to acute illness as evidenced by estimated needs.  GOAL:   Patient will meet greater than or equal to 90% of their needs  MONITOR:   PO intake, Supplement acceptance, Labs, Weight trends, I & O's  REASON FOR ASSESSMENT:   Consult Assessment of nutrition requirement/status  ASSESSMENT:   36 year old male who presented to the ED on 2/04 with AMS. Pt intubated for airway protection. PMH of polysubstance abuse. Pt admitted with drug overdose.  02/05 - extubated, diet advanced to clear liquids 02/06 - diet advanced to Regular  Discussed pt with RN and during ICU rounds. Pt likely to d/c home later today.  Spoke with pt at bedside. Pt reports excellent appetite and PO intake. Pt requesting meal tray. Spoke with RN who reports breakfast meal tray never arrived despite multiple calls down to the kitchen. RD provided pt with an Ensure Enlive and strawberry [place] ice at time of visit. Pt had just eaten graham crackers and consumed Coke prior to RD visit.  Pt reports that he typically eats "as many meals as I can" daily. Pt states that he is always hungry. He denies any recent weight changes and is unsure of his UBW.  RD to order Ensure Enlive TID between meals to maximize PO intake.  Medications reviewed and include: colace, protonix, miralax  Labs reviewed. CBG's: 81-148 x 24 hours  UOP: 3375 ml x 24 hours I/O's: -1.2 L since admit  NUTRITION - FOCUSED PHYSICAL EXAM:  Deferred per pt request.  Diet Order:   Diet Order             Diet regular Room service appropriate? Yes with Assist; Fluid consistency: Thin  Diet effective now                   EDUCATION NEEDS:   No education needs have been identified at this time  Skin:  Skin  Assessment: Reviewed RN Assessment  Last BM:  no documented BM  Height:   Ht Readings from Last 1 Encounters:  10/28/21 6' (1.829 m)    Weight:   Wt Readings from Last 1 Encounters:  10/28/21 86.2 kg    BMI:  Body mass index is 25.77 kg/m.  Estimated Nutritional Needs:   Kcal:  2300-2500  Protein:  110-125 grams  Fluid:  >/= 2.0 L    Mertie Clause, MS, RD, LDN Inpatient Clinical Dietitian Please see AMiON for contact information.

## 2021-10-31 ENCOUNTER — Emergency Department (HOSPITAL_COMMUNITY)
Admission: EM | Admit: 2021-10-31 | Discharge: 2021-11-01 | Disposition: A | Discharge status: Home / Self Care | Payer: Self-pay | Attending: Emergency Medicine | Admitting: Emergency Medicine

## 2021-10-31 ENCOUNTER — Emergency Department (HOSPITAL_COMMUNITY): Payer: Self-pay

## 2021-10-31 ENCOUNTER — Other Ambulatory Visit: Payer: Self-pay

## 2021-10-31 DIAGNOSIS — R079 Chest pain, unspecified: Secondary | ICD-10-CM | POA: Insufficient documentation

## 2021-10-31 DIAGNOSIS — Z5321 Procedure and treatment not carried out due to patient leaving prior to being seen by health care provider: Secondary | ICD-10-CM | POA: Insufficient documentation

## 2021-10-31 DIAGNOSIS — R0602 Shortness of breath: Secondary | ICD-10-CM | POA: Insufficient documentation

## 2021-10-31 LAB — BASIC METABOLIC PANEL
Anion gap: 12 (ref 5–15)
BUN: 6 mg/dL (ref 6–20)
CO2: 21 mmol/L — ABNORMAL LOW (ref 22–32)
Calcium: 9 mg/dL (ref 8.9–10.3)
Chloride: 106 mmol/L (ref 98–111)
Creatinine, Ser: 1.12 mg/dL (ref 0.61–1.24)
GFR, Estimated: >60 mL/min (ref >60)
Glucose, Bld: 77 mg/dL (ref 70–99)
Potassium: 4.2 mmol/L (ref 3.5–5.1)
Sodium: 139 mmol/L (ref 135–145)

## 2021-10-31 LAB — CBC
HCT: 36.1 % — ABNORMAL LOW (ref 39.0–52.0)
Hemoglobin: 12.6 g/dL — ABNORMAL LOW (ref 13.0–17.0)
MCH: 27.2 pg (ref 26.0–34.0)
MCHC: 34.9 g/dL (ref 30.0–36.0)
MCV: 77.8 fL — ABNORMAL LOW (ref 80.0–100.0)
Platelets: 396 10*3/uL (ref 150–400)
RBC: 4.64 MIL/uL (ref 4.22–5.81)
RDW: 14.6 % (ref 11.5–15.5)
WBC: 7.8 10*3/uL (ref 4.0–10.5)
nRBC: 0 % (ref 0.0–0.2)

## 2021-10-31 LAB — TROPONIN I (HIGH SENSITIVITY)
Troponin I (High Sensitivity): 7 ng/L (ref <18)
Troponin I (High Sensitivity): 8 ng/L (ref <18)

## 2021-10-31 NOTE — ED Triage Notes (Signed)
Patient reported he fall off of 9 step stairs; + injury on his face; and when asked if he loss consciousness, stated "I think so, for a few seconds"

## 2021-10-31 NOTE — ED Triage Notes (Signed)
Patient arrived in triage, reluctant to stay. Arrived via  EMS from a motel; reported + ETOH,  denies drug use. C/O chest pressure and shortness of breath. Improved with rest. ASA 324 mg and NTG x1 given en route.

## 2021-10-31 NOTE — ED Provider Triage Note (Signed)
Emergency Medicine Provider Triage Evaluation Note  Tristan [name] , a 36 y.o. male  was evaluated in triage.  Pt complains of chest pain since today.  Patient states chest pain worse when standing up and walking around.  Patient recently discharged from the facility on 2/6 due to drug overdose.  Patient admits to cocaine use, benzo use, opioid use, alcohol use yesterday.  Patient also requesting detox program assistance.    Review of Systems  Positive: Chest pain, shortness of breath Negative: Nausea, vomiting, diarrhea, fevers  Physical Exam  BP 108/72    Pulse (!) 129    Temp 99.7 F (37.6 C) (Oral)    Resp 18    Ht 5\' 10"  (1.778 m)    Wt 88.5 kg    SpO2 98%    BMI 27.99 kg/m  Gen:   Awake, no distress   Resp:  Normal effort  MSK:   Moves extremities without difficulty  Other:    Medical Decision Making  Medically screening exam initiated at 7:12 PM.  Appropriate orders placed.  Tristan [name] was informed that the remainder of the evaluation will be completed by another provider, this initial triage assessment does not replace that evaluation, and the importance of remaining in the ED until their evaluation is complete.     Jerelyn Charles, PA-C 10/31/21 1914

## 2021-10-31 NOTE — ED Notes (Signed)
Patient mother called to return a call, she states that he son tried to call her several times from the ER but she was in a class. NT attempted to locate patient out in the lobby with no luck. Mother states that she is concerned about patients mental capacity to make his own decisions. Mother would like to be called if son is located or checks back in. Tristan [name] [phone]

## 2021-11-01 LAB — CULTURE, BLOOD (ROUTINE X 2)
Culture: NO GROWTH
Culture: NO GROWTH
Special Requests: ADEQUATE

## 2021-11-01 NOTE — ED Notes (Signed)
PT CALLED FOR VITALS MULTIPLE TIMES AND NO RESPONSE

## 2021-11-21 ENCOUNTER — Other Ambulatory Visit (HOSPITAL_COMMUNITY)
Admission: EM | Admit: 2021-11-21 | Discharge: 2021-11-23 | Disposition: A | Discharge status: Home / Self Care | Payer: No Payment, Other | Attending: Psychiatry | Admitting: Psychiatry

## 2021-11-21 DIAGNOSIS — F319 Bipolar disorder, unspecified: Secondary | ICD-10-CM | POA: Insufficient documentation

## 2021-11-21 DIAGNOSIS — F4310 Post-traumatic stress disorder, unspecified: Secondary | ICD-10-CM | POA: Insufficient documentation

## 2021-11-21 DIAGNOSIS — Z20822 Contact with and (suspected) exposure to covid-19: Secondary | ICD-10-CM | POA: Insufficient documentation

## 2021-11-21 DIAGNOSIS — F332 Major depressive disorder, recurrent severe without psychotic features: Secondary | ICD-10-CM

## 2021-11-21 DIAGNOSIS — F1994 Other psychoactive substance use, unspecified with psychoactive substance-induced mood disorder: Secondary | ICD-10-CM | POA: Insufficient documentation

## 2021-11-21 DIAGNOSIS — I10 Essential (primary) hypertension: Secondary | ICD-10-CM | POA: Diagnosis not present

## 2021-11-21 LAB — CBC WITH DIFFERENTIAL/PLATELET
Abs Immature Granulocytes: 0.34 10*3/uL — ABNORMAL HIGH (ref 0.00–0.07)
Basophils Absolute: 0.1 10*3/uL (ref 0.0–0.1)
Basophils Relative: 0 %
Eosinophils Absolute: 0.2 10*3/uL (ref 0.0–0.5)
Eosinophils Relative: 1 %
HCT: 34 % — ABNORMAL LOW (ref 39.0–52.0)
Hemoglobin: 12.1 g/dL — ABNORMAL LOW (ref 13.0–17.0)
Immature Granulocytes: 2 %
Lymphocytes Relative: 10 %
Lymphs Abs: 2.1 10*3/uL (ref 0.7–4.0)
MCH: 26.9 pg (ref 26.0–34.0)
MCHC: 35.6 g/dL (ref 30.0–36.0)
MCV: 75.7 fL — ABNORMAL LOW (ref 80.0–100.0)
Monocytes Absolute: 1 10*3/uL (ref 0.1–1.0)
Monocytes Relative: 5 %
Neutro Abs: 16.6 10*3/uL — ABNORMAL HIGH (ref 1.7–7.7)
Neutrophils Relative %: 82 %
Platelets: 269 10*3/uL (ref 150–400)
RBC: 4.49 MIL/uL (ref 4.22–5.81)
RDW: 14.2 % (ref 11.5–15.5)
WBC: 20.3 10*3/uL — ABNORMAL HIGH (ref 4.0–10.5)
nRBC: 0 % (ref 0.0–0.2)

## 2021-11-21 LAB — RESP PANEL BY RT-PCR (FLU A&B, COVID) ARPGX2
Influenza A by PCR: NEGATIVE
Influenza B by PCR: NEGATIVE
SARS Coronavirus 2 by RT PCR: NEGATIVE

## 2021-11-21 LAB — URINALYSIS, ROUTINE W REFLEX MICROSCOPIC
Bilirubin Urine: NEGATIVE
Glucose, UA: NEGATIVE mg/dL
Hgb urine dipstick: NEGATIVE
Ketones, ur: NEGATIVE mg/dL
Leukocytes,Ua: NEGATIVE
Nitrite: NEGATIVE
Protein, ur: NEGATIVE mg/dL
Specific Gravity, Urine: 1.026 (ref 1.005–1.030)
pH: 5 (ref 5.0–8.0)

## 2021-11-21 LAB — COMPREHENSIVE METABOLIC PANEL
ALT: 17 U/L (ref 0–44)
AST: 25 U/L (ref 15–41)
Albumin: 3.5 g/dL (ref 3.5–5.0)
Alkaline Phosphatase: 51 U/L (ref 38–126)
Anion gap: 9 (ref 5–15)
BUN: 19 mg/dL (ref 6–20)
CO2: 26 mmol/L (ref 22–32)
Calcium: 8.9 mg/dL (ref 8.9–10.3)
Chloride: 98 mmol/L (ref 98–111)
Creatinine, Ser: 1.18 mg/dL (ref 0.61–1.24)
GFR, Estimated: >60 mL/min (ref >60)
Glucose, Bld: 78 mg/dL (ref 70–99)
Potassium: 3.7 mmol/L (ref 3.5–5.1)
Sodium: 133 mmol/L — ABNORMAL LOW (ref 135–145)
Total Bilirubin: 0.5 mg/dL (ref 0.3–1.2)
Total Protein: 5.9 g/dL — ABNORMAL LOW (ref 6.5–8.1)

## 2021-11-21 LAB — POCT URINE DRUG SCREEN - MANUAL ENTRY (I-SCREEN)
POC Amphetamine UR: POSITIVE — AB
POC Buprenorphine (BUP): NOT DETECTED
POC Cocaine UR: POSITIVE — AB
POC Marijuana UR: POSITIVE — AB
POC Methadone UR: NOT DETECTED
POC Methamphetamine UR: POSITIVE — AB
POC Morphine: POSITIVE — AB
POC Oxazepam (BZO): NOT DETECTED
POC Oxycodone UR: NOT DETECTED
POC Secobarbital (BAR): NOT DETECTED

## 2021-11-21 LAB — LIPID PANEL
Cholesterol: 154 mg/dL (ref 0–200)
HDL: 74 mg/dL (ref >40)
LDL Cholesterol: 66 mg/dL (ref 0–99)
Total CHOL/HDL Ratio: 2.1 RATIO
Triglycerides: 68 mg/dL (ref <150)
VLDL: 14 mg/dL (ref 0–40)

## 2021-11-21 LAB — MAGNESIUM: Magnesium: 2.2 mg/dL (ref 1.7–2.4)

## 2021-11-21 LAB — HEMOGLOBIN A1C
Hgb A1c MFr Bld: 5.1 % (ref 4.8–5.6)
Mean Plasma Glucose: 99.67 mg/dL

## 2021-11-21 LAB — ETHANOL: Alcohol, Ethyl (B): <10 mg/dL (ref <10)

## 2021-11-21 LAB — TSH: TSH: 1.732 u[IU]/mL (ref 0.350–4.500)

## 2021-11-21 LAB — POC SARS CORONAVIRUS 2 AG -  ED: SARS Coronavirus 2 Ag: NEGATIVE

## 2021-11-21 MED ORDER — LORAZEPAM 1 MG PO TABS: 1.0000 mg | ORAL_TABLET | Freq: Four times a day (QID) | ORAL | Status: DC | PRN

## 2021-11-21 MED ORDER — NAPROXEN 500 MG PO TABS
500.0000 mg | ORAL_TABLET | Freq: Two times a day (BID) | ORAL | Status: DC | PRN
  Administered 2021-11-21 – 2021-11-22 (×2): 500 mg via ORAL
  Filled 2021-11-21 (×2): qty 1

## 2021-11-21 MED ORDER — ADULT MULTIVITAMIN W/MINERALS CH
1.0000 | ORAL_TABLET | Freq: Every day | ORAL | Status: DC
  Administered 2021-11-21 – 2021-11-23 (×3): 1 via ORAL
  Filled 2021-11-21 (×3): qty 1

## 2021-11-21 MED ORDER — MAGNESIUM HYDROXIDE 400 MG/5ML PO SUSP: 30.0000 mL | Freq: Every day | ORAL | Status: DC | PRN

## 2021-11-21 MED ORDER — ACETAMINOPHEN 325 MG PO TABS: 650.0000 mg | ORAL_TABLET | Freq: Four times a day (QID) | ORAL | Status: DC | PRN

## 2021-11-21 MED ORDER — ALUM & MAG HYDROXIDE-SIMETH 200-200-20 MG/5ML PO SUSP: 30.0000 mL | ORAL | Status: DC | PRN

## 2021-11-21 MED ORDER — NICOTINE 14 MG/24HR TD PT24
14.0000 mg | MEDICATED_PATCH | Freq: Every day | TRANSDERMAL | Status: DC
  Administered 2021-11-22: 14 mg via TRANSDERMAL
  Filled 2021-11-21 (×4): qty 1

## 2021-11-21 MED ORDER — THIAMINE HCL 100 MG/ML IJ SOLN
100.0000 mg | Freq: Once | INTRAMUSCULAR | Status: AC
  Administered 2021-11-21: 100 mg via INTRAMUSCULAR
  Filled 2021-11-21: qty 2

## 2021-11-21 MED ORDER — METHOCARBAMOL 500 MG PO TABS
500.0000 mg | ORAL_TABLET | Freq: Three times a day (TID) | ORAL | Status: DC | PRN
  Administered 2021-11-22 (×2): 500 mg via ORAL
  Filled 2021-11-21 (×2): qty 1

## 2021-11-21 MED ORDER — CLONIDINE HCL 0.1 MG PO TABS: 0.1000 mg | ORAL_TABLET | ORAL | Status: DC

## 2021-11-21 MED ORDER — FLUOXETINE HCL 20 MG PO CAPS
20.0000 mg | ORAL_CAPSULE | Freq: Every day | ORAL | Status: DC
  Administered 2021-11-21 – 2021-11-23 (×3): 20 mg via ORAL
  Filled 2021-11-21: qty 1
  Filled 2021-11-21: qty 7
  Filled 2021-11-21 (×2): qty 1

## 2021-11-21 MED ORDER — CLONIDINE HCL 0.1 MG PO TABS
0.1000 mg | ORAL_TABLET | Freq: Four times a day (QID) | ORAL | Status: DC
Start: 2021-11-21 — End: 2021-11-23
  Administered 2021-11-21 – 2021-11-22 (×4): 0.1 mg via ORAL
  Filled 2021-11-21 (×5): qty 1

## 2021-11-21 MED ORDER — THIAMINE HCL 100 MG PO TABS
100.0000 mg | ORAL_TABLET | Freq: Every day | ORAL | Status: DC
  Administered 2021-11-22 – 2021-11-23 (×2): 100 mg via ORAL
  Filled 2021-11-21 (×2): qty 1

## 2021-11-21 MED ORDER — CLONIDINE HCL 0.1 MG PO TABS: 0.1000 mg | ORAL_TABLET | Freq: Every day | ORAL | Status: DC

## 2021-11-21 MED ORDER — ONDANSETRON 4 MG PO TBDP
4.0000 mg | ORAL_TABLET | Freq: Four times a day (QID) | ORAL | Status: DC | PRN
  Administered 2021-11-21: 4 mg via ORAL
  Filled 2021-11-21: qty 1

## 2021-11-21 MED ORDER — LOPERAMIDE HCL 2 MG PO CAPS
2.0000 mg | ORAL_CAPSULE | ORAL | Status: DC | PRN
  Administered 2021-11-22: 4 mg via ORAL
  Filled 2021-11-21: qty 2

## 2021-11-21 MED ORDER — TRAZODONE HCL 50 MG PO TABS
50.0000 mg | ORAL_TABLET | Freq: Every evening | ORAL | Status: DC | PRN
  Administered 2021-11-21: 50 mg via ORAL
  Filled 2021-11-21: qty 1

## 2021-11-21 MED ORDER — HYDROXYZINE HCL 25 MG PO TABS
25.0000 mg | ORAL_TABLET | Freq: Three times a day (TID) | ORAL | Status: DC | PRN
  Administered 2021-11-21 – 2021-11-23 (×2): 25 mg via ORAL
  Filled 2021-11-21 (×3): qty 1

## 2021-11-21 MED ORDER — DICYCLOMINE HCL 20 MG PO TABS
20.0000 mg | ORAL_TABLET | Freq: Four times a day (QID) | ORAL | Status: DC | PRN
  Administered 2021-11-21 – 2021-11-22 (×3): 20 mg via ORAL
  Filled 2021-11-21 (×3): qty 1

## 2021-11-21 NOTE — Progress Notes (Signed)
?   11/21/21 1602  ?[facility] Triage Screening (Walk-ins at [facility] only)  ?How Did You Hear About [place]? Self  ?What Is the Reason for Your Visit/Call Today? Pt is a 36 yo male who presented due to continuing regular drug use (heroin & meth) and increasing depression because of it. Pt endorses SI with a plan to intentionally overdose on street drugs. Pt is an IV drug user. Pt reported multiple suicide attempts in the past with the last attempt a couple of months ago when he intentionally cut his forearm hoping to bleed to death. Pt stated that he has been very close to attempting recently twice, once on his birthday ([birth year]) and again yesterday when he almost walked into traffic to be hit. Pt denies HI but stated that daily "because of my lifestyle" he feels he is put into situation where he has to defend himself or hit before he is hit. Pt denies NSSH and paranoia. Pt reported daily use of heroin and meth and weekly use of cocaine and alcohol (multiple times a week.) Last reported use was yesterday.  ?How Long Has This Been Causing You Problems? > than 6 months  ?Have You Recently Had Any Thoughts About Hurting Yourself? Yes  ?How long ago did you have thoughts about hurting yourself? this morning  ?Are You Planning to Commit Suicide/Harm Yourself At This time? Yes  ?Have you Recently Had Thoughts About Hurting Someone Karolee Ohs? Yes  ?How long ago did you have thoughts of harming others? "all the time" "I can't avoid those situations."  ?Are You Planning To Harm Someone At This Time? No  ?Are you currently experiencing any auditory, visual or other hallucinations? No  ?Have You Used Any Alcohol or Drugs in the Past 24 Hours? Yes  ?How long ago did you use Drugs or Alcohol? yesterday- use of heroin and meth  ?What Did You Use and How Much? unknown amount  ?Do you have any current medical co-morbidities that require immediate attention? No  ?Clinician description of patient physical appearance/behavior: Pt was polite, cooperative,  alert and oriented x 4. Pt was fidgety and moving his knees back and forth throughout most of the assessment. Pt was tearful throughout. Pt?s thought content and speech appeared within normal range.  Pt?s mood seemed depressed and anxious and his flat, tearful affect was congruent.  ?What Do You Feel Would Help You the Most Today? Treatment for Depression or other mood problem;Alcohol or Drug Use Treatment  ?If access to [facility] was not available, would you have sought care in the Emergency Department? Yes  ?Determination of Need Urgent (48 hours)  ?Options For Referral Facility-Based Crisis  ? ?Danil Wedge T. Jimmye Norman, MS, [facility] ?Triage Specialist ?[place] ? ?

## 2021-11-21 NOTE — ED Notes (Signed)
Pt sleeping@this time. Breathing even and unlabored. Will continue to monitor for safety 

## 2021-11-21 NOTE — ED Provider Notes (Signed)
Behavioral Health Admission [facility]BS)  Date: 11/21/21 Patient Name: Tristan [name] MRN: [id number] Chief Complaint:  Chief Complaint  Patient presents with   Addiction Problem   Suicidal      Diagnoses:  Final diagnoses:  Substance induced mood disorder (HCC)  Severe episode of recurrent major depressive disorder, without psychotic features [facility])    HPI: Patient presents voluntarily to [facility] behavioral health for walk-in assessment.    [name] presents to request substance use treatment.  He reports "I have been battling addiction since 2017."  He shares that 2 weeks ago he was placed in the ICU and required ventilation related to substance use.  He states "when I woke up with a breathing tube that was life-changing."  Patient reports he is committed to his sobriety today and would like to ultimately seek residential substance use treatment.  Patient has mostly recently resided with his mother in [place].  For the past 4 days he has been homeless after his mother told him he should "get himself together."   [name] denies suicidal ideations currently.  He reports suicidal ideations increasing recently.  Most recently felt suicidal on yesterday.  He decided to seek treatment today.  He endorses at least 4 previous suicide attempts, 3 attempts including using excessive amounts of substances.  1 previous attempt by cutting himself with a machete. He is able to contract verbally for safety with this Clinical research associate.   [name] endorses daily use of heroin, last use on yesterday.  He also endorses daily use of marijuana, last use on yesterday.  He endorses cocaine use approximately 3 times per week, last used cocaine 2 days ago.  He endorses alcohol use, typically he uses alcohol rarely.  Patient reports he has used alcohol daily for the last 1 week.  He reports he recently began experimenting with methamphetamine, approximately 1 month ago.  He last used methamphetamine  on yesterday.  He is also a daily nicotine cigarette smoker.  Patient reports he has not sought substance use treatment since 2020.  He reports since 2017 he has had a maximum of 5 consecutive days sober.  Patient has also been diagnosed with depression and bipolar 1 disorder.  He has most recently been stable on fluoxetine.  He stopped taking his fluoxetine approximately 2 weeks ago.  Would like to restart today.  He is not currently linked with outpatient psychiatry.  He reports he has been admitted to inpatient psychiatric hospitalization, most recently approximately 3 years ago at [facility] behavioral health.  Per medical record most recently admitted for observation at [facility] behavioral health in January 2021.No family mental health or addiction history reported.   Patient is assessed face-to-face by nurse practitioner.  He is seated in assessment area, no acute distress.  He is alert and oriented, pleasant and cooperative during assessment.   He presents with depressed mood, tearful affect.  He denies homicidal ideations.  Patient denies nonsuicidal self-harm behavior. He has normal speech and behavior.  He denies both auditory and visual hallucinations.  Patient is able to converse coherently with goal-directed thoughts and no distractibility or preoccupation.  He denies paranoia.  Objectively there is no evidence of psychosis/mania or delusional thinking.  Patient endorses decreased sleep and appetite.  He is not currently employed.  He denies access to weapons.  Patient offered support and encouragement.   PHQ 2-9:  Flowsheet Row ED from 11/21/2021 in [facility]  Thoughts that you would be better off dead,  or of hurting yourself in some way More than half the days  PHQ-9 Total Score 16       [place] ED from 11/21/2021 in [facility] ED from 10/11/2021 in [facility] ED from 10/05/2021 in [facility]  C-SSRS RISK CATEGORY High Risk No Risk No Risk        Total Time spent with patient: 30 minutes  Musculoskeletal  Strength & Muscle Tone: within normal limits Gait & Station: normal Patient leans: N/A  Psychiatric Specialty Exam  Presentation General Appearance: Appropriate for Environment; Casual  Eye Contact:Good  Speech:Clear and Coherent; Normal Rate  Speech Volume:Normal  Handedness:Right   Mood and Affect  Mood:Depressed  Affect:Depressed; Tearful   Thought Process  Thought Processes:Coherent; Goal Directed; Linear  Descriptions of Associations:Intact  Orientation:Full (Time, Place and Person)  Thought Content:Logical; WDL  Diagnosis of Schizophrenia or Schizoaffective disorder in past: No   Hallucinations:Hallucinations: None  Ideas of Reference:None  Suicidal Thoughts:Suicidal Thoughts: No  Homicidal Thoughts:Homicidal Thoughts: No   Sensorium  Memory:Immediate Good; Recent Good  Judgment:Fair  Insight:Fair   Executive Functions  Concentration:Good  Attention Span:Good  [place] of Knowledge:Good  Language:Good   Psychomotor Activity  Psychomotor Activity:Psychomotor Activity: Normal   Assets  Assets:Communication Skills; Desire for Improvement; Intimacy; Leisure Time; Physical Health; Resilience; Social Support   Sleep  Sleep:Sleep: Poor   Nutritional Assessment (For OBS and FBC admissions only) Has the patient had a weight loss or gain of 10 pounds or more in the last 3 months?: Yes Has the patient had a decrease in food intake/or appetite?: Yes Does the patient have dental problems?: No Does the patient have eating habits or behaviors that may be indicators of an eating disorder including binging or inducing vomiting?: No Has the patient recently lost weight without trying?: 2.0 Has the patient been eating poorly because of a decreased appetite?: 1 Malnutrition Screening  Tool Score: 3    Physical Exam ROS  Blood pressure 101/84, pulse 92, temperature 98.5 F (36.9 C), resp. rate 18, SpO2 98 %. There is no height or weight on file to calculate BMI.  Past Psychiatric History: Major depressive disorder, bipolar 1 disorder, PTSD, suicide attempt, polysubstance abuse  Is the patient at risk to self? No  Has the patient been a risk to self in the past 6 months? Yes .    Has the patient been a risk to self within the distant past? Yes   Is the patient a risk to others? No   Has the patient been a risk to others in the past 6 months? No   Has the patient been a risk to others within the distant past? No   Past Medical History:  Past Medical History:  Diagnosis Date   Bipolar 1 disorder ([place])    Hypertension    PTSD (post-traumatic stress disorder)     Past Surgical History:  Procedure Laterality Date   NO PAST SURGERIES      Family History:  Family History  Problem Relation Age of Onset   Other Father    Psychiatric Illness Father     Social History:  Social History   Socioeconomic History   Marital status: Single    Spouse name: Not on file   Number of children: Not on file   Years of education: Not on file   Highest education level: Not on file  Occupational History   Not on file  Tobacco Use   Smoking status: Every Day    Packs/day: 0.50    Types: Cigarettes   Smokeless tobacco: Never  Vaping Use   Vaping Use: Not on file  Substance and Sexual Activity   Alcohol use: Never   Drug use: Yes    Types: Marijuana, Heroin, Cocaine    Comment: denies drug use   Sexual activity: Not Currently  Other Topics Concern   Not on file  Social History Narrative   ** Merged History Encounter **       Social Determinants of Health   Financial Resource Strain: Not on file  Food Insecurity: Not on file  Transportation Needs: Not on file  Physical Activity: Not on file  Stress: Not on file  Social Connections: Not on file  Intimate  Partner Violence: Not on file    SDOH:  SDOH Screenings   Alcohol Screen: Not on file  Depression (PHQ2-9): Medium Risk   PHQ-2 Score: 16  Financial Resource Strain: Not on file  Food Insecurity: Not on file  Housing: Not on file  Physical Activity: Not on file  Social Connections: Not on file  Stress: Not on file  Tobacco Use: High Risk   Smoking Tobacco Use: Every Day   Smokeless Tobacco Use: Never   Passive Exposure: Not on file  Transportation Needs: Not on file    Last Labs:  Admission on 10/31/2021, Discharged on 11/01/2021  Component Date Value Ref Range Status   Sodium 10/31/2021 139  135 - 145 mmol/L Final   Potassium 10/31/2021 4.2  3.5 - 5.1 mmol/L Final   Chloride 10/31/2021 106  98 - 111 mmol/L Final   CO2 10/31/2021 21 (L)  22 - 32 mmol/L Final   Glucose, Bld 10/31/2021 77  70 - 99 mg/dL Final   Glucose reference range applies only to samples taken after fasting for at least 8 hours.   BUN 10/31/2021 6  6 - 20 mg/dL Final   Creatinine, Ser 10/31/2021 1.12  0.61 - 1.24 mg/dL Final   Calcium 10/31/2021 9.0  8.9 - 10.3 mg/dL Final   GFR, Estimated 10/31/2021 >60  >60 mL/min Final   Comment: (NOTE) Calculated using the CKD-EPI Creatinine Equation (2021)    Anion gap 10/31/2021 12  5 - 15 Final   Performed at [facility], [place] [address] [place], [place] [address]   WBC 10/31/2021 7.8  4.0 - 10.5 K/uL Final   RBC 10/31/2021 4.64  4.22 - 5.81 MIL/uL Final   Hemoglobin 10/31/2021 12.6 (L)  13.0 - 17.0 g/dL Final   HCT 10/31/2021 36.1 (L)  39.0 - 52.0 % Final   MCV 10/31/2021 77.8 (L)  80.0 - 100.0 fL Final   MCH 10/31/2021 27.2  26.0 - 34.0 pg Final   MCHC 10/31/2021 34.9  30.0 - 36.0 g/dL Final   RDW 10/31/2021 14.6  11.5 - 15.5 % Final   Platelets 10/31/2021 396  150 - 400 K/uL Final   nRBC 10/31/2021 0.0  0.0 - 0.2 % Final   Performed at [facility], [place] [address] [place], [place] [address]   Troponin I (High Sensitivity) 10/31/2021 7  <18  ng/L Final   Comment: (NOTE) Elevated high sensitivity troponin I (hsTnI) values and significant  changes across serial measurements may suggest ACS but many other  chronic and acute conditions are known to elevate hsTnI results.  Refer to the "Links" section for chest pain algorithms and additional  guidance. Performed at  [facility], [place] [address], [place], [place] [address]    Troponin I (High Sensitivity) 10/31/2021 8  <18 ng/L Final   Comment: (NOTE) Elevated high sensitivity troponin I (hsTnI) values and significant  changes across serial measurements may suggest ACS but many other  chronic and acute conditions are known to elevate hsTnI results.  Refer to the "Links" section for chest pain algorithms and additional  guidance. Performed at [facility], [place] [address] [place], [place] [address]     Allergies: Tomato  PTA Medications: (Not in a hospital admission)   Medical Decision Making  Patient reviewed with Dr. Serafina Mitchell.  He will be placed in observation unit at [facility] for treatment and stabilization.  He will be reassessed on 11/22/2021, disposition will be determined at that time.  Patient remains voluntary at this time. Laboratory studies ordered including CBC, CMP, ethanol, A1c, hepatic function, lipid panel, magnesium, prolactin and TSH.  Urine drug screen and urinalysis orders initiated.  EKG ordered.  Current medications: -Acetaminophen 650 mg every 6 as needed/mild pain -Fluoxetine 20 mg daily/mood -Maalox 30 mL oral every 4 as needed/digestion -Hydroxyzine 25 mg 3 times daily as needed/anxiety -Magnesium hydroxide 30 mL daily as needed/mild constipation -Nicotine transdermal patch 14 mg daily -Trazodone 50 mg nightly as needed/sleep  CIWA Ativan protocol initiated: -Loperamide 2 to 4 mg oral as needed/diarrhea or loose stools -Lorazepam 1 mg every 6 hours as needed CIWA greater than 10 -Multivitamin with minerals 1  tablet daily -Ondansetron disintegrating tablet 4 mg every 6 as needed/nausea or vomiting -Thiamine injection 100 mg IM once -Thiamine tablet 100 mg daily  Clonidine Detox protocol initiated: -Clonidine 0.1 mg 4 daily x10 doses, clonidine 0.1 mg every morning and nightly x4 doses, clonidine 0.1 mg daily before breakfast x2 doses -Dicyclomine 20 mg every 6 hours as needed/spasms or abdominal cramping -Methocarbamol 500 mg every 8 hours as needed/muscle spasms -Naproxen 500 mg twice daily as needed/aching, pain or discomfort      Recommendations  Based on my evaluation the patient does not appear to have an emergency medical condition.  Lucky Rathke, FNP 11/21/21  7:03 PM
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
## 2021-11-22 DIAGNOSIS — F319 Bipolar disorder, unspecified: Secondary | ICD-10-CM | POA: Diagnosis not present

## 2021-11-22 DIAGNOSIS — F1994 Other psychoactive substance use, unspecified with psychoactive substance-induced mood disorder: Secondary | ICD-10-CM | POA: Diagnosis not present

## 2021-11-22 DIAGNOSIS — F4310 Post-traumatic stress disorder, unspecified: Secondary | ICD-10-CM | POA: Diagnosis not present

## 2021-11-22 DIAGNOSIS — I10 Essential (primary) hypertension: Secondary | ICD-10-CM | POA: Diagnosis not present

## 2021-11-22 NOTE — ED Notes (Signed)
Pt sleeping@this time. Breathing even and unlabored. Will continue to monitor for safety 

## 2021-11-22 NOTE — ED Notes (Signed)
Patient denies [place]. Patient is cooperative and interacts well with staff. Respiratory is even and unlabored. No distress noted. Patient resting in bed at present. will continue to monitor for safety.  ?

## 2021-11-22 NOTE — ED Notes (Signed)
Pt went straight to sleep shortly after arrival on the unit after eating spaghetti meal. ?

## 2021-11-22 NOTE — ED Notes (Signed)
Patient admitted to [facility] from [facility]  Patient is calm and cooperative with admission.  No He was oriented to unit and given lunch.  He reports mild opiate withdrawal symptoms with joint/muscle ache, yawning and some tearing.  No piloerection, nausea or vomiting noted.  Patient denies avh shi or plan at this time and reports that he wants to go to 28 day rehab.  He appears motivated for treatment.  Initial education begun by Probation officer regarding addiction and recovery.  Patient was receptive to learning.  Encouraged him to seek out staff if overwhelmed by thoughts or feelings.  Will monitor and provide a safe supportive environment for him.   ?

## 2021-11-22 NOTE — Progress Notes (Signed)
Patient complained of opiate withdrawal of diarrhea, stomach cramps, muscle ache and anxiety.  He was given standing clonidine, robaxin, bentyl, vistaril and 4 mg imodium for symptoms.  Awaiting result.   ?

## 2021-11-22 NOTE — ED Notes (Signed)
Pt is in the bed sleeping. Respirations are even and unlabored. No acute distress noted. Will continue to monitor for safety. 

## 2021-11-22 NOTE — ED Notes (Addendum)
Pt refused his clonidine and refused his blood pressure to be taken. Provider notified. ?

## 2021-11-22 NOTE — ED Notes (Signed)
Pt refused to participate in [place] meeting pt stated that he just got here today and is not mentally ready for group. ?

## 2021-11-22 NOTE — ED Notes (Signed)
Pt was given breakfast and juice. ?

## 2021-11-22 NOTE — Progress Notes (Addendum)
Patient is alert and oriented X 4, endorsing needing help with substance abuse. Patient denies SI, HI and AVH. Patient is cooperative and calm on unit, no acute behaviors observed on unit at this time. ?

## 2021-11-22 NOTE — ED Provider Notes (Signed)
Behavioral Health Progress Note  Date and Time: 11/22/2021 12:33 PM Name: Tristan [name] MRN:  [id number]  Subjective:   36 year old man with history of depression, substance use, bipolar disorder who [facility] for assistance with substance use treatment.  Patient had reported using heroin, marijuana, cocaine and meth.  UDS + for amphetamines, cocaine, methamphetamines, morphine, marijuana.  Per chart review, [facility].  Diagnosis at discharge substance-induced mood disorder, MDD, polysubstance abuse.  Patient was discharged with fluoxetine 10 mg, hydroxyzine 25 mg every 6 hours as needed and quetiapine 50 mg nightly.  On interview patient is found lying in bed in no acute distress.  Patient states that he presented yesterday for assistance with substance use and that is "getting out of control".  He states that he has been using heroin since he was 36 years old.  He states he has been using daily and using over 1 g a day; he reports his last heroin use was yesterday.  He reports current opioid withdrawal symptoms of body aches, nausea, sweating and diarrhea.  He denies vomiting, runny nose, watery eyes.  He denies SI/HI/AVH.  Patient states he has been diagnosed with depression and bipolar disorder.  By description is unclear if patient has had a true manic episode.  Patient states that he has had times where he acts impulsively and will drive out of state.  Patient states that during these periods he can go with very little sleep for approximately 2 days and that energy is variable during this time.  Patient indicates that he has had time periods like this in the context of sobriety.  Patient [place]  Patient states that he was released in February 2020 and had difficulty adjusting to his release as well as the pandemic.  Patient reports multiple  psychiatric hospitalizations since this time.  Obtained from patient and chart review Past Psychiatric History: Previous Medication Trials: Remeron, Zyprexa, Seroquel Previous Psychiatric Hospitalizations: 6 x since 2020 Previous Suicide Attempts: yes - x 4; 3 of which involve overdosing on substances History of Violence: no Outpatient psychiatrist: no  Social History: Children: 2 Source of Income: not currently employed Education:  did not Scientist, product/process developmentassess Special Ed: did not assess Housing Status: homeless; previously was living with his mother up until 4 days ago   Substance Use (with emphasis over the last 12 months) Recreational Drugs: Daily heroin use-over a gram a day.  Cocaine 3 times a week.  Rare alcohol use.  Methamphetamine on occasion for the last month. Use of Alcohol: rare Rehab History: denies H/O Complicated Withdrawal: no  Legal History: Past Charges/Incarcerations: yes, previously incarcerated for ~2 years. Released in feb 2002. States he has a Programme researcher, broadcasting/film/videoparole officer Pending charges: denies  Family Psychiatric History: yes - unsure what diagnoses they have but describes family members being hospitalized. Per chart review father with h/o mental illness     Diagnosis:  Final diagnoses:  Substance induced mood disorder (HCC)  Severe episode of recurrent major depressive disorder, without psychotic features (HCC)    Total Time spent with patient: 30 minutes  Past Psychiatric History: bipolar disorder, MDD, SIMD Past Medical History:  Past Medical History:  Diagnosis Date   Bipolar 1 disorder (HCC)    Hypertension    PTSD (post-traumatic stress disorder)     Past Surgical History:  Procedure Laterality Date   NO PAST SURGERIES  Family History:  Family History  Problem Relation Age of Onset   Other Father    Psychiatric Illness Father    Family Psychiatric  History: yes - unsure what diagnoses they have but describes family members being hospitalized.per  chart review father with h/o mental illness Social History:  Social History   Substance and Sexual Activity  Alcohol Use Never     Social History   Substance and Sexual Activity  Drug Use Yes   Types: Marijuana, Heroin, Cocaine   Comment: denies drug use    Social History   Socioeconomic History   Marital status: Single    Spouse name: Not on file   Number of children: Not on file   Years of education: Not on file   Highest education level: Not on file  Occupational History   Not on file  Tobacco Use   Smoking status: Every Day    Packs/day: 0.50    Types: Cigarettes   Smokeless tobacco: Never  Vaping Use   Vaping Use: Not on file  Substance and Sexual Activity   Alcohol use: Never   Drug use: Yes    Types: Marijuana, Heroin, Cocaine    Comment: denies drug use   Sexual activity: Not Currently  Other Topics Concern   Not on file  Social History Narrative   ** Merged History Encounter **       Social Determinants of Health   Financial Resource Strain: Not on file  Food Insecurity: Not on file  Transportation Needs: Not on file  Physical Activity: Not on file  Stress: Not on file  Social Connections: Not on file   SDOH:  SDOH Screenings   Alcohol Screen: Not on file  Depression (PHQ2-9): Medium Risk   PHQ-2 Score: 16  Financial Resource Strain: Not on file  Food Insecurity: Not on file  Housing: Not on file  Physical Activity: Not on file  Social Connections: Not on file  Stress: Not on file  Tobacco Use: High Risk   Smoking Tobacco Use: Every Day   Smokeless Tobacco Use: Never   Passive Exposure: Not on file  Transportation Needs: Not on file   Additional Social History:    Pain Medications: see MAR Prescriptions: see MAR Over the Counter: see MAR History of alcohol / drug use?: Yes Longest period of sobriety (when/how long): unknown Name of Substance 1: heroin - IV 1 - Age of First Use: 29 1 - Amount (size/oz): about 1 gm 1 - Frequency:  daily 1 - Duration: ongoing 1 - Last Use / Amount: yesterday 1 - Method of Aquiring: unknown 1- Route of Use: IV Name of Substance 2: meth- IV 2 - Age of First Use: 29 2 - Amount (size/oz): unknown 2 - Frequency: daily 2 - Duration: ongoing 2 - Last Use / Amount: yesterday 2 - Method of Aquiring: unknown 2 - Route of Substance Use: IV Name of Substance 3: cocaine 3 - Age of First Use: 19 3 - Amount (size/oz): varies 3 - Frequency: 3-4 times a week 3 - Duration: ongoing 3 - Last Use / Amount: 2 days ago 3 - Method of Aquiring: unknown 3 - Route of Substance Use: snort Name of Substance 4: alcohol 4 - Age of First Use: 13 4 - Amount (size/oz): pint of liquor 4 - Frequency: 1-2 times a week 4 - Duration: ongoing 4 - Last Use / Amount: "a few days ago" 4 - Method of Aquiring: unknown 4 - Route of Substance  Use: drink/oral            Sleep: Fair  Appetite:  Fair  Current Medications:  Current Facility-Administered Medications  Medication Dose Route Frequency Provider Last Rate Last Admin   acetaminophen (TYLENOL) tablet 650 mg  650 mg Oral Q6H PRN Lenard Lance, FNP       alum & mag hydroxide-simeth (MAALOX/MYLANTA) 200-200-20 MG/5ML suspension 30 mL  30 mL Oral Q4H PRN Lenard Lance, FNP       cloNIDine (CATAPRES) tablet 0.1 mg  0.1 mg Oral QID Lenard Lance, FNP   0.1 mg at 11/22/21 7482   Followed by   Melene Muller ON 11/24/2021] cloNIDine (CATAPRES) tablet 0.1 mg  0.1 mg Oral Nilsa Nutting, FNP       Followed by   Melene Muller ON 11/26/2021] cloNIDine (CATAPRES) tablet 0.1 mg  0.1 mg Oral QAC breakfast Lenard Lance, FNP       dicyclomine (BENTYL) tablet 20 mg  20 mg Oral Q6H PRN Lenard Lance, FNP   20 mg at 11/22/21 0856   FLUoxetine (PROZAC) capsule 20 mg  20 mg Oral Daily Lenard Lance, FNP   20 mg at 11/22/21 7078   hydrOXYzine (ATARAX) tablet 25 mg  25 mg Oral TID PRN Lenard Lance, FNP   25 mg at 11/21/21 2054   loperamide (IMODIUM) capsule 2-4 mg  2-4 mg Oral PRN  Lenard Lance, FNP       LORazepam (ATIVAN) tablet 1 mg  1 mg Oral Q6H PRN Lenard Lance, FNP       magnesium hydroxide (MILK OF MAGNESIA) suspension 30 mL  30 mL Oral Daily PRN Lenard Lance, FNP       methocarbamol (ROBAXIN) tablet 500 mg  500 mg Oral Q8H PRN Lenard Lance, FNP   500 mg at 11/22/21 6754   multivitamin with minerals tablet 1 tablet  1 tablet Oral Daily Lenard Lance, FNP   1 tablet at 11/22/21 0834   naproxen (NAPROSYN) tablet 500 mg  500 mg Oral BID PRN Lenard Lance, FNP   500 mg at 11/22/21 0856   nicotine (NICODERM CQ - dosed in mg/24 hours) patch 14 mg  14 mg Transdermal Daily Lenard Lance, FNP       ondansetron (ZOFRAN-ODT) disintegrating tablet 4 mg  4 mg Oral Q6H PRN Lenard Lance, FNP   4 mg at 11/21/21 2054   thiamine tablet 100 mg  100 mg Oral Daily Lenard Lance, FNP   100 mg at 11/22/21 0834   traZODone (DESYREL) tablet 50 mg  50 mg Oral QHS PRN Lenard Lance, FNP   50 mg at 11/21/21 2059   No current outpatient medications on file.    Labs  Lab Results:  Admission on 11/21/2021  Component Date Value Ref Range Status   SARS Coronavirus 2 by RT PCR 11/21/2021 NEGATIVE  NEGATIVE Final   Comment: (NOTE) SARS-CoV-2 target nucleic acids are NOT DETECTED.  The SARS-CoV-2 RNA is generally detectable in upper respiratory specimens during the acute phase of infection. The lowest concentration of SARS-CoV-2 viral copies this assay can detect is 138 copies/mL. A negative result does not preclude SARS-Cov-2 infection and should not be used as the sole basis for treatment or other patient management decisions. A negative result may occur with  improper specimen collection/handling, submission of specimen other than nasopharyngeal swab, presence of viral mutation(s) within the areas targeted by this assay,  and inadequate number of viral copies(<138 copies/mL). A negative result must be combined with clinical observations, patient history, and  epidemiological information. The expected result is Negative.  Fact Sheet for Patients:  BloggerCourse.com  Fact Sheet for Healthcare Providers:  SeriousBroker.it  This test is no                          t yet approved or cleared by the [place] FDA and  has been authorized for detection and/or diagnosis of SARS-CoV-2 by FDA under an Emergency Use Authorization (EUA). This EUA will remain  in effect (meaning this test can be used) for the duration of the COVID-19 declaration under Section 564(b)(1) of the Act, 21 U.S.C.section 360bbb-3(b)(1), unless the authorization is terminated  or revoked sooner.       Influenza A by PCR 11/21/2021 NEGATIVE  NEGATIVE Final   Influenza B by PCR 11/21/2021 NEGATIVE  NEGATIVE Final   Comment: (NOTE) The Xpert Xpress SARS-CoV-2/FLU/RSV plus assay is intended as an aid in the diagnosis of influenza from Nasopharyngeal swab specimens and should not be used as a sole basis for treatment. Nasal washings and aspirates are unacceptable for Xpert Xpress SARS-CoV-2/FLU/RSV testing.  Fact Sheet for Patients: BloggerCourse.com  Fact Sheet for Healthcare Providers: SeriousBroker.it  This test is not yet approved or cleared by the [place] FDA and has been authorized for detection and/or diagnosis of SARS-CoV-2 by FDA under an Emergency Use Authorization (EUA). This EUA will remain in effect (meaning this test can be used) for the duration of the COVID-19 declaration under Section 564(b)(1) of the Act, 21 U.S.C. section 360bbb-3(b)(1), unless the authorization is terminated or revoked.  Performed at [facility], [address], [place], [place] [address]    WBC 11/21/2021 20.3 (H)  4.0 - 10.5 K/uL Final   RBC 11/21/2021 4.49  4.22 - 5.81 MIL/uL Final   Hemoglobin 11/21/2021 12.1 (L)  13.0 - 17.0 g/dL Final   HCT 60/45/4098 34.0 (L)   39.0 - 52.0 % Final   MCV 11/21/2021 75.7 (L)  80.0 - 100.0 fL Final   MCH 11/21/2021 26.9  26.0 - 34.0 pg Final   MCHC 11/21/2021 35.6  30.0 - 36.0 g/dL Final   RDW 11/91/4782 14.2  11.5 - 15.5 % Final   Platelets 11/21/2021 269  150 - 400 K/uL Final   nRBC 11/21/2021 0.0  0.0 - 0.2 % Final   Neutrophils Relative % 11/21/2021 82  % Final   Neutro Abs 11/21/2021 16.6 (H)  1.7 - 7.7 K/uL Final   Lymphocytes Relative 11/21/2021 10  % Final   Lymphs Abs 11/21/2021 2.1  0.7 - 4.0 K/uL Final   Monocytes Relative 11/21/2021 5  % Final   Monocytes Absolute 11/21/2021 1.0  0.1 - 1.0 K/uL Final   Eosinophils Relative 11/21/2021 1  % Final   Eosinophils Absolute 11/21/2021 0.2  0.0 - 0.5 K/uL Final   Basophils Relative 11/21/2021 0  % Final   Basophils Absolute 11/21/2021 0.1  0.0 - 0.1 K/uL Final   Immature Granulocytes 11/21/2021 2  % Final   Abs Immature Granulocytes 11/21/2021 0.34 (H)  0.00 - 0.07 K/uL Final   Performed at [facility], [address] [place], [place] [address]   Sodium 11/21/2021 133 (L)  135 - 145 mmol/L Final   Potassium 11/21/2021 3.7  3.5 - 5.1 mmol/L Final   Chloride 11/21/2021 98  98 - 111 mmol/L  Final   CO2 11/21/2021 26  22 - 32 mmol/L Final   Glucose, Bld 11/21/2021 78  70 - 99 mg/dL Final   Glucose reference range applies only to samples taken after fasting for at least 8 hours.   BUN 11/21/2021 19  6 - 20 mg/dL Final   Creatinine, Ser 11/21/2021 1.18  0.61 - 1.24 mg/dL Final   Calcium 40/98/1191 8.9  8.9 - 10.3 mg/dL Final   Total Protein 47/82/9562 5.9 (L)  6.5 - 8.1 g/dL Final   Albumin 13/04/6577 3.5  3.5 - 5.0 g/dL Final   AST 46/96/2952 25  15 - 41 U/L Final   ALT 11/21/2021 17  0 - 44 U/L Final   Alkaline Phosphatase 11/21/2021 51  38 - 126 U/L Final   Total Bilirubin 11/21/2021 0.5  0.3 - 1.2 mg/dL Final   GFR, Estimated 11/21/2021 >60  >60 mL/min Final   Comment: (NOTE) Calculated using the CKD-EPI Creatinine Equation (2021)    Anion gap  11/21/2021 9  5 - 15 Final   Performed at [facility], [address], [place], [place] [address]   Hgb A1c MFr Bld 11/21/2021 5.1  4.8 - 5.6 % Final   Comment: (NOTE) Pre diabetes:          5.7%-6.4%  Diabetes:              >6.4%  Glycemic control for   <7.0% adults with diabetes    Mean Plasma Glucose 11/21/2021 99.67  mg/dL Final   Performed at [facility], [address], [place], [place] [address]   Magnesium 11/21/2021 2.2  1.7 - 2.4 mg/dL Final   Performed at [facility], [address] [place], [place] [address]   Alcohol, Ethyl (B) 11/21/2021 <10  <10 mg/dL Final   Comment: (NOTE) Lowest detectable limit for serum alcohol is 10 mg/dL.  For medical purposes only. Performed at [facility], [address], [place], [place] [address]    Cholesterol 11/21/2021 154  0 - 200 mg/dL Final   Triglycerides 34/74/2595 68  <150 mg/dL Final   HDL 63/87/5643 74  >40 mg/dL Final   Total CHOL/HDL Ratio 11/21/2021 2.1  RATIO Final   VLDL 11/21/2021 14  0 - 40 mg/dL Final   LDL Cholesterol 11/21/2021 66  0 - 99 mg/dL Final   Comment:        Total Cholesterol/HDL:CHD Risk Coronary Heart Disease Risk Table                     Men   Women  1/2 Average Risk   3.4   3.3  Average Risk       5.0   4.4  2 X Average Risk   9.6   7.1  3 X Average Risk  23.4   11.0        Use the calculated Patient Ratio above and the CHD Risk Table to determine the patient's CHD Risk.        ATP III CLASSIFICATION (LDL):  <100     mg/dL   Optimal  329-518  mg/dL   Near or Above                    Optimal  130-159  mg/dL   Borderline  841-660  mg/dL   High  >630     mg/dL   Very High Performed at [facility], [address], [place], [place] [address]    TSH 11/21/2021 1.732  0.350 - 4.500 uIU/mL Final   Comment: Performed by a 3rd Generation assay with a functional sensitivity of <=0.01 uIU/mL. Performed at [facility], [address], [place], [place]  [address]    Color, Urine 11/21/2021 YELLOW  YELLOW Final   APPearance 11/21/2021 CLEAR  CLEAR Final   Specific Gravity, Urine 11/21/2021 1.026  1.005 - 1.030 Final   pH 11/21/2021 5.0  5.0 - 8.0 Final   Glucose, UA 11/21/2021 NEGATIVE  NEGATIVE mg/dL Final   Hgb urine dipstick 11/21/2021 NEGATIVE  NEGATIVE Final   Bilirubin Urine 11/21/2021 NEGATIVE  NEGATIVE Final   Ketones, ur 11/21/2021 NEGATIVE  NEGATIVE mg/dL Final   Protein, ur 61/95/0932 NEGATIVE  NEGATIVE mg/dL Final   Nitrite 67/08/4579 NEGATIVE  NEGATIVE Final   Leukocytes,Ua 11/21/2021 NEGATIVE  NEGATIVE Final   Performed at [facility], [address], [place], [place] [address]   POC Amphetamine UR 11/21/2021 Positive (A)  NONE DETECTED (Cut Off Level 1000 ng/mL) Final   POC Secobarbital (BAR) 11/21/2021 None Detected  NONE DETECTED (Cut Off Level 300 ng/mL) Final   POC Buprenorphine (BUP) 11/21/2021 None Detected  NONE DETECTED (Cut Off Level 10 ng/mL) Final   POC Oxazepam (BZO) 11/21/2021 None Detected  NONE DETECTED (Cut Off Level 300 ng/mL) Final   POC Cocaine UR 11/21/2021 Positive (A)  NONE DETECTED (Cut Off Level 300 ng/mL) Final   POC Methamphetamine UR 11/21/2021 Positive (A)  NONE DETECTED (Cut Off Level 1000 ng/mL) Final   POC Morphine 11/21/2021 Positive (A)  NONE DETECTED (Cut Off Level 300 ng/mL) Final   POC Oxycodone UR 11/21/2021 None Detected  NONE DETECTED (Cut Off Level 100 ng/mL) Final   POC Methadone UR 11/21/2021 None Detected  NONE DETECTED (Cut Off Level 300 ng/mL) Final   POC Marijuana UR 11/21/2021 Positive (A)  NONE DETECTED (Cut Off Level 50 ng/mL) Final   SARS Coronavirus 2 Ag 11/21/2021 Negative  Negative Preliminary  Admission on 10/31/2021, Discharged on 11/01/2021  Component Date Value Ref Range Status   Sodium 10/31/2021 139  135 - 145 mmol/L Final   Potassium 10/31/2021 4.2  3.5 - 5.1 mmol/L Final   Chloride 10/31/2021 106  98 - 111 mmol/L Final   CO2 10/31/2021 21 (L)  22 - 32 mmol/L  Final   Glucose, Bld 10/31/2021 77  70 - 99 mg/dL Final   Glucose reference range applies only to samples taken after fasting for at least 8 hours.   BUN 10/31/2021 6  6 - 20 mg/dL Final   Creatinine, Ser 10/31/2021 1.12  0.61 - 1.24 mg/dL Final   Calcium 82/50/5397 9.0  8.9 - 10.3 mg/dL Final   GFR, Estimated 10/31/2021 >60  >60 mL/min Final   Comment: (NOTE) Calculated using the CKD-EPI Creatinine Equation (2021)    Anion gap 10/31/2021 12  5 - 15 Final   Performed at [facility], [address] [place], [place] [address]   WBC 10/31/2021 7.8  4.0 - 10.5 K/uL Final   RBC 10/31/2021 4.64  4.22 - 5.81 MIL/uL Final   Hemoglobin 10/31/2021 12.6 (L)  13.0 - 17.0 g/dL Final   HCT 93/79/0240 36.1 (L)  39.0 - 52.0 % Final   MCV 10/31/2021 77.8 (L)  80.0 - 100.0 fL Final   MCH 10/31/2021 27.2  26.0 - 34.0 pg Final   MCHC 10/31/2021 34.9  30.0 - 36.0 g/dL Final  RDW 10/31/2021 14.6  11.5 - 15.5 % Final   Platelets 10/31/2021 396  150 - 400 K/uL Final   nRBC 10/31/2021 0.0  0.0 - 0.2 % Final   Performed at [facility], [address], [place], [place] [address]   Troponin I (High Sensitivity) 10/31/2021 7  <18 ng/L Final   Comment: (NOTE) Elevated high sensitivity troponin I (hsTnI) values and significant  changes across serial measurements may suggest ACS but many other  chronic and acute conditions are known to elevate hsTnI results.  Refer to the "Links" section for chest pain algorithms and additional  guidance. Performed at [facility], [address] [place], [place] [address]    Troponin I (High Sensitivity) 10/31/2021 8  <18 ng/L Final   Comment: (NOTE) Elevated high sensitivity troponin I (hsTnI) values and significant  changes across serial measurements may suggest ACS but many other  chronic and acute conditions are known to elevate hsTnI results.  Refer to the "Links" section for chest pain algorithms and additional  guidance. Performed at [facility], [address] [place], [place] [address]   Admission on 10/27/2021, Discharged on 10/29/2021  Component Date Value Ref Range Status   Sodium 10/27/2021 136  135 - 145 mmol/L Final   Potassium 10/27/2021 4.1  3.5 - 5.1 mmol/L Final   Chloride 10/27/2021 101  98 - 111 mmol/L Final   BUN 10/27/2021 9  8 - 23 mg/dL Final   Creatinine, Ser 10/27/2021 1.10  0.61 - 1.24 mg/dL Final   Glucose, Bld 91/47/8295 83  70 - 99 mg/dL Final   Glucose reference range applies only to samples taken after fasting for at least 8 hours.   Calcium, Ion 10/27/2021 1.18  1.15 - 1.40 mmol/L Final   TCO2 10/27/2021 25  22 - 32 mmol/L Final   Hemoglobin 10/27/2021 12.6 (L)  13.0 - 17.0 g/dL Final   HCT 62/13/0865 37.0 (L)  39.0 - 52.0 % Final   pH, Ven 10/27/2021 7.101 (LL)  7.250 - 7.430 Final   pCO2, Ven 10/27/2021 73.0 (HH)  44.0 - 60.0 mmHg Final   pO2, Ven 10/27/2021 66.0 (H)  32.0 - 45.0 mmHg Final   Bicarbonate 10/27/2021 22.7  20.0 - 28.0 mmol/L Final   TCO2 10/27/2021 25  22 - 32 mmol/L Final   O2 Saturation 10/27/2021 83.0  % Final   Acid-base deficit 10/27/2021 8.0 (H)  0.0 - 2.0 mmol/L Final   Sodium 10/27/2021 136  135 - 145 mmol/L Final   Potassium 10/27/2021 4.1  3.5 - 5.1 mmol/L Final   Calcium, Ion 10/27/2021 1.18  1.15 - 1.40 mmol/L Final   HCT 10/27/2021 37.0 (L)  39.0 - 52.0 % Final   Hemoglobin 10/27/2021 12.6 (L)  13.0 - 17.0 g/dL Final   Sample type 78/46/9629 VENOUS   Final   Comment 10/27/2021 NOTIFIED PHYSICIAN   Final   Sodium 10/27/2021 137  135 - 145 mmol/L Final   Potassium 10/27/2021 4.2  3.5 - 5.1 mmol/L Final   Chloride 10/27/2021 103  98 - 111 mmol/L Final   CO2 10/27/2021 21 (L)  22 - 32 mmol/L Final   Glucose, Bld 10/27/2021 89  70 - 99 mg/dL Final   Glucose reference range applies only to samples taken after fasting for at least 8 hours.   BUN 10/27/2021 9  6 - 20 mg/dL Final   QA FLAGS AND/OR RANGES MODIFIED BY DEMOGRAPHIC UPDATE ON 02/05 AT 1611  Creatinine, Ser 10/27/2021 1.24  0.61 - 1.24 mg/dL Final   Calcium 24/40/1027 8.5 (L)  8.9 - 10.3 mg/dL Final   Total Protein 25/36/6440 5.4 (L)  6.5 - 8.1 g/dL Final   Albumin 34/74/2595 3.2 (L)  3.5 - 5.0 g/dL Final   AST 63/87/5643 34  15 - 41 U/L Final   ALT 10/27/2021 24  0 - 44 U/L Final   Alkaline Phosphatase 10/27/2021 44  38 - 126 U/L Final   Total Bilirubin 10/27/2021 0.5  0.3 - 1.2 mg/dL Final   GFR, Estimated 10/27/2021 39 (L)  >60 mL/min Final   Comment: (NOTE) Calculated using the CKD-EPI Creatinine Equation (2021)    Anion gap 10/27/2021 13  5 - 15 Final   Performed at [facility], [address], [place], [place] [address]   Alcohol, Ethyl (B) 10/27/2021 <10  <10 mg/dL Final   Comment: (NOTE) Lowest detectable limit for serum alcohol is 10 mg/dL.  For medical purposes only. Performed at [facility], [address], [place], [place] [address]    SARS Coronavirus 2 by RT PCR 10/27/2021 NEGATIVE  NEGATIVE Final   Comment: (NOTE) SARS-CoV-2 target nucleic acids are NOT DETECTED.  The SARS-CoV-2 RNA is generally detectable in upper respiratory specimens during the acute phase of infection. The lowest concentration of SARS-CoV-2 viral copies this assay can detect is 138 copies/mL. A negative result does not preclude SARS-Cov-2 infection and should not be used as the sole basis for treatment or other patient management decisions. A negative result may occur with  improper specimen collection/handling, submission of specimen other than nasopharyngeal swab, presence of viral mutation(s) within the areas targeted by this assay, and inadequate number of viral copies(<138 copies/mL). A negative result must be combined with clinical observations, patient history, and epidemiological information. The expected result is Negative.  Fact Sheet for Patients:  BloggerCourse.com  Fact Sheet for Healthcare Providers:   SeriousBroker.it  This test is no                          t yet approved or cleared by the [place] FDA and  has been authorized for detection and/or diagnosis of SARS-CoV-2 by FDA under an Emergency Use Authorization (EUA). This EUA will remain  in effect (meaning this test can be used) for the duration of the COVID-19 declaration under Section 564(b)(1) of the Act, 21 U.S.C.section 360bbb-3(b)(1), unless the authorization is terminated  or revoked sooner.       Influenza A by PCR 10/27/2021 NEGATIVE  NEGATIVE Final   Influenza B by PCR 10/27/2021 NEGATIVE  NEGATIVE Final   Comment: (NOTE) The Xpert Xpress SARS-CoV-2/FLU/RSV plus assay is intended as an aid in the diagnosis of influenza from Nasopharyngeal swab specimens and should not be used as a sole basis for treatment. Nasal washings and aspirates are unacceptable for Xpert Xpress SARS-CoV-2/FLU/RSV testing.  Fact Sheet for Patients: BloggerCourse.com  Fact Sheet for Healthcare Providers: SeriousBroker.it  This test is not yet approved or cleared by the [place] FDA and has been authorized for detection and/or diagnosis of SARS-CoV-2 by FDA under an Emergency Use Authorization (EUA). This EUA will remain in effect (meaning this test can be used) for the duration of the COVID-19 declaration under Section 564(b)(1) of the Act, 21 U.S.C. section 360bbb-3(b)(1), unless the authorization is terminated or revoked.  Performed at [facility], [address], [place], [place] [address]  Color, Urine 10/27/2021 YELLOW  YELLOW Final   APPearance 10/27/2021 CLOUDY (A)  CLEAR Final   Specific Gravity, Urine 10/27/2021 1.020  1.005 - 1.030 Final   pH 10/27/2021 7.0  5.0 - 8.0 Final   Glucose, UA 10/27/2021 NEGATIVE  NEGATIVE mg/dL Final   Hgb urine dipstick 10/27/2021 NEGATIVE  NEGATIVE Final   Bilirubin Urine 10/27/2021 NEGATIVE   NEGATIVE Final   Ketones, ur 10/27/2021 NEGATIVE  NEGATIVE mg/dL Final   Protein, ur 84/69/6295 30 (A)  NEGATIVE mg/dL Final   Nitrite 28/41/3244 NEGATIVE  NEGATIVE Final   Leukocytes,Ua 10/27/2021 NEGATIVE  NEGATIVE Final   Performed at [facility], [address] [place], [place] [address]   Opiates 10/27/2021 NONE DETECTED  NONE DETECTED Final   Cocaine 10/27/2021 POSITIVE (A)  NONE DETECTED Final   Benzodiazepines 10/27/2021 POSITIVE (A)  NONE DETECTED Final   Amphetamines 10/27/2021 POSITIVE (A)  NONE DETECTED Final   Tetrahydrocannabinol 10/27/2021 NONE DETECTED  NONE DETECTED Final   Barbiturates 10/27/2021 NONE DETECTED  NONE DETECTED Final   Comment: (NOTE) DRUG SCREEN FOR MEDICAL PURPOSES ONLY.  IF CONFIRMATION IS NEEDED FOR ANY PURPOSE, NOTIFY LAB WITHIN 5 DAYS.  LOWEST DETECTABLE LIMITS FOR URINE DRUG SCREEN Drug Class                     Cutoff (ng/mL) Amphetamine and metabolites    1000 Barbiturate and metabolites    200 Benzodiazepine                 200 Tricyclics and metabolites     300 Opiates and metabolites        300 Cocaine and metabolites        300 THC                            50 Performed at [facility], [address], [place], [place] [address]    WBC 10/27/2021 9.2  4.0 - 10.5 K/uL Final   RBC 10/27/2021 4.53  4.22 - 5.81 MIL/uL Final   Hemoglobin 10/27/2021 12.1 (L)  13.0 - 17.0 g/dL Final   HCT 44/11/4740 36.1 (L)  39.0 - 52.0 % Final   MCV 10/27/2021 79.7 (L)  80.0 - 100.0 fL Final   MCH 10/27/2021 26.7  26.0 - 34.0 pg Final   MCHC 10/27/2021 33.5  30.0 - 36.0 g/dL Final   RDW 59/56/3875 14.4  11.5 - 15.5 % Final   Platelets 10/27/2021 330  150 - 400 K/uL Final   nRBC 10/27/2021 0.0  0.0 - 0.2 % Final   Neutrophils Relative % 10/27/2021 66  % Final   Neutro Abs 10/27/2021 6.0  1.7 - 7.7 K/uL Final   Lymphocytes Relative 10/27/2021 25  % Final   Lymphs Abs 10/27/2021 2.3  0.7 - 4.0 K/uL Final   Monocytes Relative 10/27/2021 6   % Final   Monocytes Absolute 10/27/2021 0.6  0.1 - 1.0 K/uL Final   Eosinophils Relative 10/27/2021 1  % Final   Eosinophils Absolute 10/27/2021 0.1  0.0 - 0.5 K/uL Final   Basophils Relative 10/27/2021 1  % Final   Basophils Absolute 10/27/2021 0.1  0.0 - 0.1 K/uL Final   Immature Granulocytes 10/27/2021 1  % Final   Abs Immature Granulocytes 10/27/2021 0.12 (H)  0.00 - 0.07 K/uL Final   Performed at [facility], [address] [place], [place] [address]  Prothrombin Time 10/27/2021 13.2  11.4 - 15.2 seconds Final   INR 10/27/2021 1.0  0.8 - 1.2 Final   Comment: (NOTE) INR goal varies based on device and disease states. Performed at [facility], [address], [place], [place] [address]    ABO/RH(D) 10/27/2021 O POS   Final   Antibody Screen 10/27/2021 NEG   Final   Sample Expiration 10/27/2021    Final                   Value:10/30/2021,2359 Performed at [facility], [address] [place] [address] [place], [place] [address]    Specimen Description 10/27/2021 BLOOD RIGHT HAND   Final   Special Requests 10/27/2021 BOTTLES DRAWN AEROBIC AND ANAEROBIC Blood Culture adequate volume   Final   Culture 10/27/2021    Final                   Value:NO GROWTH 5 DAYS Performed at [facility], [address] [place], [place] [address]    Report Status 10/27/2021 11/01/2021 FINAL   Final   Specimen Description 10/27/2021 BLOOD LEFT FOREARM   Final   Special Requests 10/27/2021 BOTTLES DRAWN AEROBIC AND ANAEROBIC Blood Culture results may not be optimal due to an excessive volume of blood received in culture bottles   Final   Culture 10/27/2021    Final                   Value:NO GROWTH 5 DAYS Performed at [facility], [address], [place], [place] [address]    Report Status 10/27/2021 11/01/2021 FINAL   Final   Lactic Acid, Venous 10/27/2021 7.0 (HH)  0.5 - 1.9 mmol/L Final   Comment: CRITICAL RESULT CALLED TO, READ BACK BY AND VERIFIED WITH: Glena Norfolk RN  10/27/21 2051 Enid Derry Performed at [facility], [address] [place], [place] [address]    Lactic Acid, Venous 10/27/2021 7.0 (HH)  0.5 - 1.9 mmol/L Final   Comment: CRITICAL VALUE NOTED.  VALUE IS CONSISTENT WITH PREVIOUSLY REPORTED AND CALLED VALUE. Performed at [facility], [address], [place], [place] [address]    Lipase 10/27/2021 38  11 - 51 U/L Final   Performed at [facility], [address], [place], [place] [address]   Salicylate Lvl 10/27/2021 <7.0 (L)  7.0 - 30.0 mg/dL Final   Performed at [facility], [address] [place], [place] [address]   Acetaminophen (Tylenol), Serum 10/27/2021 <10 (L)  10 - 30 ug/mL Final   Comment: (NOTE) Therapeutic concentrations vary significantly. A range of 10-30 ug/mL  may be an effective concentration for many patients. However, some  are best treated at concentrations outside of this range. Acetaminophen concentrations >150 ug/mL at 4 hours after ingestion  and >50 ug/mL at 12 hours after ingestion are often associated with  toxic reactions.  Performed at [facility], [address], [place], [place] [address]    pH, Arterial 10/27/2021 7.418  7.350 - 7.450 Final   pCO2 arterial 10/27/2021 38.0  32.0 - 48.0 mmHg Final   pO2, Arterial 10/27/2021 306 (H)  83.0 - 108.0 mmHg Final   Bicarbonate 10/27/2021 25.0  20.0 - 28.0 mmol/L Final   TCO2 10/27/2021 26  22 - 32 mmol/L Final   O2 Saturation 10/27/2021 100.0  % Final   Acid-Base Excess 10/27/2021 0.0  0.0 - 2.0 mmol/L Final  Sodium 10/27/2021 136  135 - 145 mmol/L Final   Potassium 10/27/2021 3.7  3.5 - 5.1 mmol/L Final   Calcium, Ion 10/27/2021 1.15  1.15 - 1.40 mmol/L Final   HCT 10/27/2021 27.0 (L)  39.0 - 52.0 % Final   Hemoglobin 10/27/2021 9.2 (L)  13.0 - 17.0 g/dL Final   Patient temperature 10/27/2021 95.7 F   Final   Collection site 10/27/2021 RADIAL, ALLEN'S TEST ACCEPTABLE   Final   Drawn by 10/27/2021 RT   Final   Sample  type 10/27/2021 ARTERIAL   Final   RBC / HPF 10/27/2021 0-5  0 - 5 RBC/hpf Final   WBC, UA 10/27/2021 0-5  0 - 5 WBC/hpf Final   Bacteria, UA 10/27/2021 RARE (A)  NONE SEEN Final   Squamous Epithelial / LPF 10/27/2021 0-5  0 - 5 Final   Mucus 10/27/2021 PRESENT   Final   Hyaline Casts, UA 10/27/2021 PRESENT   Final   Performed at [facility], [address] [place], [place] [address]   Procalcitonin 10/27/2021 <0.10  ng/mL Final   Comment:        Interpretation: PCT (Procalcitonin) <= 0.5 ng/mL: Systemic infection (sepsis) is not likely. Local bacterial infection is possible. (NOTE)       Sepsis PCT Algorithm           Lower Respiratory Tract                                      Infection PCT Algorithm    ----------------------------     ----------------------------         PCT < 0.25 ng/mL                PCT < 0.10 ng/mL          Strongly encourage             Strongly discourage   discontinuation of antibiotics    initiation of antibiotics    ----------------------------     -----------------------------       PCT 0.25 - 0.50 ng/mL            PCT 0.10 - 0.25 ng/mL               OR       >80% decrease in PCT            Discourage initiation of                                            antibiotics      Encourage discontinuation           of antibiotics    ----------------------------     -----------------------------         PCT >= 0.50 ng/mL              PCT 0.26 - 0.50 ng/mL               AND                                 <80% decrease in PCT             Encourage initiation of  antibiotics       Encourage continuation           of antibiotics    ----------------------------     -----------------------------        PCT >= 0.50 ng/mL                  PCT > 0.50 ng/mL               AND         increase in PCT                  Strongly encourage                                      initiation of antibiotics    Strongly  encourage escalation           of antibiotics                                     -----------------------------                                           PCT <= 0.25 ng/mL                                                 OR                                        > 80% decrease in PCT                                      Discontinue / Do not initiate                                             antibiotics  Performed at [facility], [address], [place], [place] [address]    WBC 10/28/2021 9.9  4.0 - 10.5 K/uL Final   RBC 10/28/2021 4.38  4.22 - 5.81 MIL/uL Final   Hemoglobin 10/28/2021 11.6 (L)  13.0 - 17.0 g/dL Final   HCT 60/45/4098 32.7 (L)  39.0 - 52.0 % Final   MCV 10/28/2021 74.7 (L)  80.0 - 100.0 fL Final   MCH 10/28/2021 26.5  26.0 - 34.0 pg Final   MCHC 10/28/2021 35.5  30.0 - 36.0 g/dL Final   RDW 11/91/4782 14.0  11.5 - 15.5 % Final   Platelets 10/28/2021 278  150 - 400 K/uL Final   nRBC 10/28/2021 0.0  0.0 - 0.2 % Final   Performed at [facility], [address], [place], [place] [address]   Sodium 10/28/2021 136  135 - 145 mmol/L Final   Potassium 10/28/2021 3.5  3.5 - 5.1 mmol/L Final   Chloride 10/28/2021 106  98 - 111 mmol/L Final   CO2 10/28/2021 23  22 - 32 mmol/L Final   Glucose, Bld 10/28/2021 87  70 - 99 mg/dL Final   Glucose reference range applies only to samples taken after fasting for at least 8 hours.   BUN 10/28/2021 10  6 - 20 mg/dL Final   QA FLAGS AND/OR RANGES MODIFIED BY DEMOGRAPHIC UPDATE ON 02/05 AT 1611   Creatinine, Ser 10/28/2021 0.93  0.61 - 1.24 mg/dL Final   Calcium 16/06/9603 8.3 (L)  8.9 - 10.3 mg/dL Final   GFR, Estimated 10/28/2021 55 (L)  >60 mL/min Final   Comment: (NOTE) Calculated using the CKD-EPI Creatinine Equation (2021)    Anion gap 10/28/2021 7  5 - 15 Final   Performed at [facility], [address] [place], [place] [address]   Magnesium 10/28/2021 2.1  1.7 - 2.4 mg/dL Final   Performed at [facility], [address] [place], [place] [address]   Phosphorus 10/28/2021 3.5  2.5 - 4.6 mg/dL Final   Performed at [facility], [address], [place], [place] [address]   MRSA by PCR Next Gen 10/28/2021 NOT DETECTED  NOT DETECTED Final   Comment: (NOTE) The GeneXpert MRSA Assay (FDA approved for NASAL specimens only), is one component of a comprehensive MRSA colonization surveillance program. It is not intended to diagnose MRSA infection nor to guide or monitor treatment for MRSA infections. Test performance is not FDA approved in patients less than 51 years old. Performed at [facility], [address] [place], [place] [address]    Lactic Acid, Venous 10/28/2021 1.1  0.5 - 1.9 mmol/L Final   Performed at [facility], [address] [place], [place] [address]   Glucose-Capillary 10/28/2021 74  70 - 99 mg/dL Final   Glucose reference range applies only to samples taken after fasting for at least 8 hours.   Total CK 10/28/2021 578 (H)  49 - 397 U/L Final   Performed at [facility], [address], [place], [place] [address]   Glucose-Capillary 10/28/2021 84  70 - 99 mg/dL Final   Glucose reference range applies only to samples taken after fasting for at least 8 hours.   Glucose-Capillary 10/28/2021 86  70 - 99 mg/dL Final   Glucose reference range applies only to samples taken after fasting for at least 8 hours.   ABO/RH(D) 10/28/2021    Final                   Value:O POS Performed at [facility], [address] [place], [place] [address]    Glucose-Capillary 10/28/2021 81  70 - 99 mg/dL Final   Glucose reference range applies only to samples taken after fasting for at least 8 hours.   Sodium 10/27/2021 136  135 - 145 mmol/L Final   Potassium 10/27/2021 4.1  3.5 - 5.1 mmol/L Final   Chloride 10/27/2021 101  98 - 111 mmol/L Final   BUN 10/27/2021 9  6 - 20 mg/dL Final   QA FLAGS AND/OR RANGES MODIFIED BY DEMOGRAPHIC UPDATE ON 02/05 AT  1611   Creatinine, Ser 10/27/2021 1.10  0.61 - 1.24 mg/dL Final   Glucose, Bld 13/24/4010 83  70 - 99 mg/dL Final   Glucose reference range applies only to samples taken after fasting for at least 8 hours.   Calcium, Ion 10/27/2021 1.18  1.15 - 1.40 mmol/L Final  TCO2 10/27/2021 25  22 - 32 mmol/L Final   Hemoglobin 10/27/2021 12.6 (L)  13.0 - 17.0 g/dL Final   HCT 16/06/9603 37.0 (L)  39.0 - 52.0 % Final   Glucose-Capillary 10/28/2021 148 (H)  70 - 99 mg/dL Final   Glucose reference range applies only to samples taken after fasting for at least 8 hours.   Procalcitonin 10/29/2021 <0.10  ng/mL Final   Comment:        Interpretation: PCT (Procalcitonin) <= 0.5 ng/mL: Systemic infection (sepsis) is not likely. Local bacterial infection is possible. (NOTE)       Sepsis PCT Algorithm           Lower Respiratory Tract                                      Infection PCT Algorithm    ----------------------------     ----------------------------         PCT < 0.25 ng/mL                PCT < 0.10 ng/mL          Strongly encourage             Strongly discourage   discontinuation of antibiotics    initiation of antibiotics    ----------------------------     -----------------------------       PCT 0.25 - 0.50 ng/mL            PCT 0.10 - 0.25 ng/mL               OR       >80% decrease in PCT            Discourage initiation of                                            antibiotics      Encourage discontinuation           of antibiotics    ----------------------------     -----------------------------         PCT >= 0.50 ng/mL              PCT 0.26 - 0.50 ng/mL               AND                                 <80% decrease in PCT             Encourage initiation of                                             antibiotics       Encourage continuation           of antibiotics    ----------------------------     -----------------------------        PCT >= 0.50 ng/mL                  PCT >  0.50 ng/mL  AND         increase in PCT                  Strongly encourage                                      initiation of antibiotics    Strongly encourage escalation           of antibiotics                                     -----------------------------                                           PCT <= 0.25 ng/mL                                                 OR                                        > 80% decrease in PCT                                      Discontinue / Do not initiate                                             antibiotics  Performed at [facility], [address], [place], [place] [address]    WBC 10/29/2021 10.2  4.0 - 10.5 K/uL Final   RBC 10/29/2021 4.26  4.22 - 5.81 MIL/uL Final   Hemoglobin 10/29/2021 11.7 (L)  13.0 - 17.0 g/dL Final   HCT 60/45/4098 32.3 (L)  39.0 - 52.0 % Final   MCV 10/29/2021 75.8 (L)  80.0 - 100.0 fL Final   MCH 10/29/2021 27.5  26.0 - 34.0 pg Final   MCHC 10/29/2021 36.2 (H)  30.0 - 36.0 g/dL Final   RDW 11/91/4782 14.5  11.5 - 15.5 % Final   Platelets 10/29/2021 298  150 - 400 K/uL Final   nRBC 10/29/2021 0.0  0.0 - 0.2 % Final   Performed at [facility], [address] [place], [place] [address]   Sodium 10/29/2021 136  135 - 145 mmol/L Final   Potassium 10/29/2021 3.5  3.5 - 5.1 mmol/L Final   Chloride 10/29/2021 104  98 - 111 mmol/L Final   CO2 10/29/2021 25  22 - 32 mmol/L Final   Glucose, Bld 10/29/2021 129 (H)  70 - 99 mg/dL Final   Glucose reference range applies only to samples taken after fasting for at least 8 hours.   BUN 10/29/2021 8  6 - 20 mg/dL Final   Creatinine, Ser 10/29/2021 1.05  0.61 - 1.24 mg/dL Final   Calcium 30/86/5784 7.9 (L)  8.9 - 10.3 mg/dL Final   GFR, Estimated 10/29/2021 >60  >60 mL/min Final   Comment: (NOTE) Calculated using the CKD-EPI Creatinine Equation (2021)    Anion gap 10/29/2021 7  5 - 15 Final   Performed at [facility], [address], [place], [place] [address]   Folate 10/29/2021 6.9  >5.9 ng/mL Final   Performed at [facility], [address], [place], [place] [address]    Blood Alcohol level:  Lab Results  Component Value Date   ETH <10 11/21/2021   ETH <10 10/27/2021    Metabolic Disorder Labs: Lab Results  Component Value Date   HGBA1C 5.1 11/21/2021   MPG 99.67 11/21/2021   MPG 96.8 02/22/2019   No results found for: PROLACTIN Lab Results  Component Value Date   CHOL 154 11/21/2021   TRIG 68 11/21/2021   HDL 74 11/21/2021   CHOLHDL 2.1 11/21/2021   VLDL 14 11/21/2021   LDLCALC 66 11/21/2021   LDLCALC 66 02/22/2019    Therapeutic Lab Levels: No results found for: LITHIUM Lab Results  Component Value Date   VALPROATE 42.9 (L) 05/06/2014   No components found for:  CBMZ  Physical Findings   AUDIT    Flowsheet Row Admission (Discharged) from [facility] Ad[facility] (Discharged[facility] Admission (Discharged) from [facility] Admission (Discharged) from 05[facility]02/[facility]  Alcohol Use Disorder Identification Test Final Score (AUDIT) 28 27 0 0      PHQ2-9    Flowsheet Row ED from 11/21/2021 in [facility]  PHQ-2 Total Score 4  PHQ-9 Total Score 16      Flowsheet Row ED from 11/21/2021 in [facility] ED from 10/11/2021 in [facility] from 10/05/2021 in [facility]  C-SSRS RISK CATEGORY Error: Q3, 4, or 5 should not be populated when Q2 is No No Risk No Risk        Musculoskeletal  Strength & Muscle Tone: within normal limits Gait & Station: normal Patient leans: N/A  Psychiatric Specialty Exam  Presentation  General Appearance: Appropriate for Environment; Casual  Eye  Contact:Good  Speech:Clear and Coherent; Normal Rate  Speech Volume:Normal  Handedness:Right   Mood and Affect  Mood:Depressed  Affect:Appropriate; Congruent   Thought Process  Thought Processes:Coherent; Goal Directed; Linear  Descriptions of Associations:Intact  Orientation:Full (Time, Place and Person)  Thought Content:WDL; Logical  Diagnosis of Schizophrenia or Schizoaffective disorder in past: No    Hallucinations:Hallucinations: None  Ideas of Reference:None  Suicidal Thoughts:Suicidal Thoughts: No  Homicidal Thoughts:Homicidal Thoughts: No   Sensorium  Memory:Immediate Good; Recent Good  Judgment:Fair  Insight:Fair   Executive Functions  Concentration:Good  Attention Span:Good  Recall:Good  Fund of Knowledge:Good  Language:Good   Psychomotor Activity  Psychomotor Activity:Psychomotor Activity: Normal   Assets  Assets:Communication Skills; Desire for Improvement; Intimacy; Leisure Time; Physical Health; Social Support   Sleep  Sleep:Sleep: Fair   Nutritional Assessment (For OBS and FBC admissions only) Has the patient had a weight loss or gain of 10 pounds or more in the last 3 months?: Yes Has the patient had a decrease in food intake/or appetite?: Yes Does the patient have dental problems?: No Does the patient have eating habits or behaviors that may be indicators of an eating disorder including binging or inducing  vomiting?: No Has the patient recently lost weight without trying?: 2.0 Has the patient been eating poorly because of a decreased appetite?: 1 Malnutrition Screening Tool Score: 3    Physical Exam  Physical Exam Constitutional:      Appearance: Normal appearance. He is normal weight.  HENT:     Head: Normocephalic and atraumatic.  Eyes:     Extraocular Movements: Extraocular movements intact.  Pulmonary:     Effort: Pulmonary effort is normal.  Neurological:     General: No focal deficit present.     Mental  Status: He is alert and oriented to person, place, and time.  Psychiatric:        Attention and Perception: Attention and perception normal.        Speech: Speech normal.        Behavior: Behavior normal. Behavior is cooperative.        Thought Content: Thought content normal.   Review of Systems  Constitutional:  Positive for diaphoresis. Negative for chills and fever.  HENT:  Negative for hearing loss.   Eyes:  Negative for discharge and redness.  Respiratory:  Negative for cough.   Cardiovascular:  Negative for chest pain.  Gastrointestinal:  Positive for diarrhea and nausea. Negative for abdominal pain and vomiting.  Musculoskeletal:  Positive for myalgias.  Neurological:  Negative for headaches.  Psychiatric/Behavioral:  Positive for depression and substance abuse. Negative for hallucinations and suicidal ideas. The patient is nervous/anxious.   Blood pressure 112/67, pulse 67, temperature 98.5 F (36.9 C), temperature source Oral, resp. rate 18, SpO2 98 %. There is no height or weight on file to calculate BMI.  Treatment Plan Summary: 36 year old man with history of depression, substance use, bipolar disorder who presented to the [facility] on 11/21/2021 for assistance with substance use treatment.  Patient had reported using heroin, marijuana, cocaine and meth.  UDS + for amphetamines, cocaine, methamphetamines, morphine, marijuana.  Patient denies SI/HI/AVH.  Patient reports desire to go to rehab.  Patient currently reporting symptoms that would be consistent with opioid detox including generalized body aches, nausea, sweating, diarrhea.  Patient tolerating clonidine detox protocol well as well as reinitiation of previous antidepressant.  By history obtained today, it does not appear that patient has experienced a true manic/hypomanic episode and will continue antidepressant as ordered-during stay will explore symptoms in greater detail.  Patient is appropriate to transfer to the [facility] for  continued treatment, crisis stabilization and detox.   MDD  R/o SIMD  R/o bipolar spectrum disorder -continue Fluoxetine 20 mg daily/mood  Polysubstance abuse CIWA Ativan protocol initiated: -Loperamide 2 to 4 mg oral as needed/diarrhea or loose stools -Lorazepam 1 mg every 6 hours as needed CIWA greater than 10 -Multivitamin with minerals 1 tablet daily -Ondansetron disintegrating tablet 4 mg every 6 as needed/nausea or vomiting -Thiamine injection 100 mg IM once -Thiamine tablet 100 mg daily   Clonidine Detox protocol : -Clonidine 0.1 mg 4 daily x10 doses, clonidine 0.1 mg every morning and nightly x4 doses, clonidine 0.1 mg daily before breakfast x2 doses -Dicyclomine 20 mg every 6 hours as needed/spasms or abdominal cramping -Methocarbamol 500 mg every 8 hours as needed/muscle spasms -Naproxen 500 mg twice daily as needed/aching, pain or discomfort  Nicotine dependence -continue nicoderm cq patch  Dispo: ongoing. SW assisting-patient requesting residential rehab upon completion of detox.  Estella Husk, MD 11/22/2021 12:33 PM

## 2021-11-22 NOTE — ED Notes (Signed)
Pt had sneak ?

## 2021-11-22 NOTE — ED Notes (Signed)
Pt was given sandwich, chips, and juice for lunch. ?

## 2021-11-23 DIAGNOSIS — F4310 Post-traumatic stress disorder, unspecified: Secondary | ICD-10-CM | POA: Diagnosis not present

## 2021-11-23 DIAGNOSIS — F319 Bipolar disorder, unspecified: Secondary | ICD-10-CM | POA: Diagnosis not present

## 2021-11-23 DIAGNOSIS — I10 Essential (primary) hypertension: Secondary | ICD-10-CM | POA: Diagnosis not present

## 2021-11-23 DIAGNOSIS — F1994 Other psychoactive substance use, unspecified with psychoactive substance-induced mood disorder: Secondary | ICD-10-CM | POA: Diagnosis not present

## 2021-11-23 LAB — PROLACTIN: Prolactin: 4.5 ng/mL (ref 4.0–15.2)

## 2021-11-23 MED ORDER — FLUOXETINE HCL 20 MG PO CAPS
20.0000 mg | ORAL_CAPSULE | Freq: Every day | ORAL | 0 refills | Status: DC
Start: 2021-11-24 — End: 2022-01-08

## 2021-11-23 NOTE — Group Note (Signed)
Group Topic: Relapse and Recovery  ?Group Date: 11/23/2021 ?Start Time: 1008 ?End Time: 1030 ?Facilitators: Doyne Keel E  ?Department: [facility] ? ?Number of Participants: 4  ?Group Focus: activities of daily living skills, coping skills, and substance abuse education ?Treatment Modality:  Behavior Modification Therapy ?Interventions utilized were patient education ?Purpose: enhance coping skills and relapse prevention strategies ? ?Name: Tristan [name] Date of Birth: January 13, 1986  ?MR: [id number]   ? ?Level of Participation: minimal ?Quality of Participation: attentive ?Interactions with others: n/a ?Mood/Affect: appropriate ?Triggers (if applicable): n/a ?Cognition: coherent/clear ?Progress: Moderate ?Response: n/a ?Plan: follow-up needed ? ?Patients Problems:  ?Patient Active Problem List  ? Diagnosis Date Noted  ? Altered mental status 10/27/2021  ? Polysubstance abuse (HCC)   ? Lactic acidosis   ? Acute respiratory failure with hypoxia and hypercapnia (HCC)   ? AKI (acute kidney injury) (HCC)   ? Substance induced mood disorder (HCC) 09/29/2019  ? MDD (major depressive disorder), recurrent episode, severe (HCC) 02/20/2019  ? Polysubstance abuse (HCC) 01/29/2019  ? MDD (major depressive disorder), severe (HCC) 01/27/2019  ? Suicide attempt [facility])   ? Bipolar 1 disorder, mixed (HCC) 05/02/2014  ?  ?

## 2021-11-23 NOTE — ED Notes (Signed)
Snacks given 

## 2021-11-23 NOTE — ED Notes (Signed)
Pt is in the bed sleeping. Respirations are even and unlabored. No acute distress noted. Will continue to monitor for safety. 

## 2021-11-23 NOTE — ED Provider Notes (Signed)
FBC/OBS ASAP Discharge Summary  Date and Time: 11/23/2021 12:24 PM  Name: Tristan [name]  MRN:  [id number]   Discharge Diagnoses:  Final diagnoses:  Substance induced mood disorder (HCC)  Severe episode of recurrent major depressive disorder, without psychotic features (HCC)    Subjective:  Patient seen and chart reviewed chart reviewed-patient has been medication compliant been appropriate with staff and peers on the unit. Most recent CIWA 0 and COWS 5.  Patient interviewed this morning in his room.  Prior to interview, patient was observed participating in group appropriately.  Prior to interview  RN reported that  patient was requesting to be discharged.  Patient is calm, cooperative and pleasant on interview.  He denies nausea, vomiting, diarrhea, runny nose or watery eyes.  He denies body aches.  Patient had requested that clonidine taper be discontinued due to feeling lightheaded.  Patient states that he feels ready for discharge at this time and states that his parents have set up outpatient treatment for him.  He states that he is considering going to a Suboxone or rehab although indicated he is undecided.  He denies SI/HI/AVH.  Patient states that he slept "on and off" yesterday and attributes this to not taking trazodone and feeling somewhat anxious.  Discussed with patient the benefits of staying including but not limited to continued detox, observation, medication adjustments; however,  states that he would like to discharge at this time.  Patient was informed by LCSW a referral has been made to day [facility] and  he is advised to follow up.  Patient verbalized understanding.  On my interview, patient is in NAD, alert, oriented, calm, cooperative, and attentive, with normal affect, speech, and behavior. Objectively, there is no evidence of psychosis/ mania (able to converse coherently, linear and goal directed thought, no RIS, no distractibility, not pre-occupied, no FOI, etc) nor  depression to the point of suicidality (able to concentrate, affect full and reactive, speech normal r/v/t, no psychomotor retardation/agitation, etc).  Overall, patient appears to be at the point, in the absence of inhibiting or disinhibiting symptoms, where he can successfully move to lesser restrictive setting for care.   Stay Summary:  36 year old man with history of depression, substance use, bipolar disorder who presented to the [facility] on 11/21/2021 for assistance with substance use treatment.  Patient had reported using heroin, marijuana, cocaine and meth.  Patient has also reported using occasional aocohol. Etoh negative. UDS + for amphetamines, cocaine, methamphetamines, morphine, marijuana.  Patient was restarted on home fluoxetine 20 mg and started on CIWA and COWS protcol.  Patient was transferred to the [facility] on 11/22/2021.  On 3/eighth/2023 patient requested discharge-see above for additional details regarding day of discharge.  Patient denied SI/HI/AVH and was not an imminent risk to self, others or psychotic and therefore was discharged per her request.  Prior to discharge, patient was provided with outpatient resources by LCSW that was placed in AVS.  Patient was discharged with 7-day prescription of Prozac was provided a 30-day paper prescription.  Total Time spent with patient: 15 minutes  Past Psychiatric History: PTSD, reported bipolar disorder, MDD, polysubstance abuse Past Medical History:  Past Medical History:  Diagnosis Date   Bipolar 1 disorder (HCC)    Hypertension    PTSD (post-traumatic stress disorder)     Past Surgical History:  Procedure Laterality Date   NO PAST SURGERIES     Family History:  Family History  Problem Relation Age of Onset   Other Father  Psychiatric Illness Father    Family Psychiatric History: yes - unsure what diagnoses they have but describes family members being hospitalized. Per chart review father with h/o mental illness Social History:   Social History   Substance and Sexual Activity  Alcohol Use Never     Social History   Substance and Sexual Activity  Drug Use Yes   Types: Marijuana, Heroin, Cocaine   Comment: denies drug use    Social History   Socioeconomic History   Marital status: Single    Spouse name: Not on file   Number of children: Not on file   Years of education: Not on file   Highest education level: Not on file  Occupational History   Not on file  Tobacco Use   Smoking status: Every Day    Packs/day: 0.50    Types: Cigarettes   Smokeless tobacco: Never  Vaping Use   Vaping Use: Not on file  Substance and Sexual Activity   Alcohol use: Never   Drug use: Yes    Types: Marijuana, Heroin, Cocaine    Comment: denies drug use   Sexual activity: Not Currently  Other Topics Concern   Not on file  Social History Narrative   ** Merged History Encounter **       Social Determinants of Health   Financial Resource Strain: Not on file  Food Insecurity: Not on file  Transportation Needs: Not on file  Physical Activity: Not on file  Stress: Not on file  Social Connections: Not on file   SDOH:  SDOH Screenings   Alcohol Screen: Not on file  Depression (PHQ2-9): Medium Risk   PHQ-2 Score: 16  Financial Resource Strain: Not on file  Food Insecurity: Not on file  Housing: Not on file  Physical Activity: Not on file  Social Connections: Not on file  Stress: Not on file  Tobacco Use: High Risk   Smoking Tobacco Use: Every Day   Smokeless Tobacco Use: Never   Passive Exposure: Not on file  Transportation Needs: Not on file    Tobacco Cessation:  N/A, patient does not currently use tobacco products  Current Medications:  Current Facility-Administered Medications  Medication Dose Route Frequency Provider Last Rate Last Admin   acetaminophen (TYLENOL) tablet 650 mg  650 mg Oral Q6H PRN Lenard Lance, FNP       alum & mag hydroxide-simeth (MAALOX/MYLANTA) 200-200-20 MG/5ML suspension  30 mL  30 mL Oral Q4H PRN Lenard Lance, FNP       dicyclomine (BENTYL) tablet 20 mg  20 mg Oral Q6H PRN Lenard Lance, FNP   20 mg at 11/22/21 1819   FLUoxetine (PROZAC) capsule 20 mg  20 mg Oral Daily Lenard Lance, FNP   20 mg at 11/23/21 0949   hydrOXYzine (ATARAX) tablet 25 mg  25 mg Oral TID PRN Lenard Lance, FNP   25 mg at 11/23/21 5093   loperamide (IMODIUM) capsule 2-4 mg  2-4 mg Oral PRN Lenard Lance, FNP   4 mg at 11/22/21 1823   LORazepam (ATIVAN) tablet 1 mg  1 mg Oral Q6H PRN Lenard Lance, FNP       magnesium hydroxide (MILK OF MAGNESIA) suspension 30 mL  30 mL Oral Daily PRN Lenard Lance, FNP       methocarbamol (ROBAXIN) tablet 500 mg  500 mg Oral Q8H PRN Lenard Lance, FNP   500 mg at 11/22/21 1819   multivitamin with minerals  tablet 1 tablet  1 tablet Oral Daily Lenard Lance, FNP   1 tablet at 11/23/21 0949   naproxen (NAPROSYN) tablet 500 mg  500 mg Oral BID PRN Lenard Lance, FNP   500 mg at 11/22/21 0856   nicotine (NICODERM CQ - dosed in mg/24 hours) patch 14 mg  14 mg Transdermal Daily Lenard Lance, FNP   14 mg at 11/22/21 1521   ondansetron (ZOFRAN-ODT) disintegrating tablet 4 mg  4 mg Oral Q6H PRN Lenard Lance, FNP   4 mg at 11/21/21 2054   thiamine tablet 100 mg  100 mg Oral Daily Lenard Lance, FNP   100 mg at 11/23/21 0949   traZODone (DESYREL) tablet 50 mg  50 mg Oral QHS PRN Lenard Lance, FNP   50 mg at 11/21/21 2059   No current outpatient medications on file.    PTA Medications: (Not in a hospital admission)   Musculoskeletal  Strength & Muscle Tone: within normal limits Gait & Station: normal Patient leans: N/A  Psychiatric Specialty Exam  Presentation  General Appearance: Appropriate for Environment; Casual  Eye Contact:Good  Speech:Clear and Coherent; Normal Rate  Speech Volume:Normal  Handedness:Right   Mood and Affect  Mood:Depressed  Affect:Appropriate; Congruent   Thought Process  Thought Processes:Coherent; Goal Directed;  Linear  Descriptions of Associations:Intact  Orientation:Full (Time, Place and Person)  Thought Content:WDL; Logical  Diagnosis of Schizophrenia or Schizoaffective disorder in past: No    Hallucinations:Hallucinations: None  Ideas of Reference:None  Suicidal Thoughts:Suicidal Thoughts: No  Homicidal Thoughts:Homicidal Thoughts: No   Sensorium  Memory:Immediate Good; Recent Good  Judgment:Fair  Insight:Fair   Executive Functions  Concentration:Good  Attention Span:Good  Recall:Good  Fund of Knowledge:Good  Language:Good   Psychomotor Activity  Psychomotor Activity:Psychomotor Activity: Normal   Assets  Assets:Communication Skills; Desire for Improvement; Intimacy; Leisure Time; Physical Health; Social Support   Sleep  Sleep:Sleep: Fair   No data recorded  Physical Exam  Physical Exam Constitutional:      Appearance: Normal appearance. He is normal weight.  HENT:     Head: Normocephalic and atraumatic.  Eyes:     Extraocular Movements: Extraocular movements intact.  Pulmonary:     Effort: Pulmonary effort is normal.  Neurological:     General: No focal deficit present.     Mental Status: He is alert and oriented to person, place, and time.  Psychiatric:        Attention and Perception: Attention and perception normal.        Speech: Speech normal.        Behavior: Behavior normal. Behavior is cooperative.        Thought Content: Thought content normal.   Review of Systems  Constitutional:  Negative for chills and fever.  HENT:  Negative for hearing loss.   Eyes:  Negative for discharge and redness.  Respiratory:  Negative for cough.   Cardiovascular:  Negative for chest pain.  Gastrointestinal:  Negative for abdominal pain.  Musculoskeletal:  Negative for myalgias.  Neurological:  Negative for headaches.  Psychiatric/Behavioral:  Positive for substance abuse. Negative for hallucinations and suicidal ideas.   Blood pressure 102/78, pulse  62, temperature 97.8 F (36.6 C), temperature source Tympanic, resp. rate 18, SpO2 100 %. There is no height or weight on file to calculate BMI.  Demographic Factors:  Male and Unemployed  Loss Factors: Decrease in vocational status and Financial problems/change in socioeconomic status  Historical Factors: Family history of mental illness  or substance abuse and Impulsivity  Risk Reduction Factors:   Positive social support  Continued Clinical Symptoms:  Depression:   Comorbid alcohol abuse/dependence Alcohol/Substance Abuse/Dependencies  Cognitive Features That Contribute To Risk:  Thought constriction (tunnel vision)    Suicide Risk:  Minimal: No identifiable suicidal ideation.  Patients presenting with no risk factors but with morbid ruminations; may be classified as minimal risk based on the severity of the depressive symptoms  Plan Of Care/Follow-up recommendations:  Activity:  as tolerated Diet:  regular Other:    Take all medications as prescribed by his/her mental healthcare provider. Report any adverse effects and or reactions from the medicines to your outpatient provider promptly. Do not engage in alcohol and or illegal drug use while on prescription medicines. In the event of worsening symptoms, call the crisis hotline, 911 and or go to the nearest ED for appropriate evaluation and treatment of symptoms. follow-up with your primary care provider for your other medical issues, concerns and or health care needs.  Allergies as of 11/23/2021       Reactions   Tomato         Medication List     TAKE these medications    FLUoxetine 20 MG capsule Commonly known as: PROZAC Take 1 capsule (20 mg total) by mouth daily. Start taking on: November 24, 2021       Patient provided with 7 day sample of above medication as well as 30 day paper script to fill at pharmacy of choice.  .  Patient was provided with follow up information regarding psychiatric outpatient  resources in AVS with the assistance of SW prior to discharge.      Disposition: self care  Estella HuskKatherine S Sylvester Salonga, MD 11/23/2021, 12:24 PM

## 2021-11-23 NOTE — ED Notes (Signed)
In a second group with the nurse ?

## 2021-11-23 NOTE — Discharge Instructions (Signed)
Take all medications as prescribed by his/her mental healthcare provider. °Report any adverse effects and or reactions from the medicines to your outpatient provider promptly. °Do not engage in alcohol and or illegal drug use while on prescription medicines. °In the event of worsening symptoms, call the crisis hotline, 911 and or go to the nearest ED for appropriate evaluation and treatment of symptoms. °follow-up with your primary care provider for your other medical issues, concerns and or health care needs. ° ° ° ° °Please come to [facility] (this facility) during walk in hours for appointment with psychiatrist for further medication management and for therapists for therapy.  ° ° Walk in hours are 8-11 AM Monday through Thursday for medication management. Therapy walk in hours are Monday-Wednesday 8 AM-1PM.   It is first come, first -serve; it is best to arrive by 7:00 AM.  ° °On Friday from 1 pm to 4 pm for therapy intake only. Please arrive by 12:00 pm as it is  first come, first -serve.   ° °When you arrive please go upstairs for your appointment. If you are unsure of where to go, inform the front desk that you are here for a walk in appointment and they will assist you with directions upstairs. ° °Address:  °[address], in [place], [address] °Ph: [phone]  ° °

## 2021-11-23 NOTE — Clinical Social Work Psych Note (Signed)
LCSW Initial Note  ? ?LCSW met with Harmon for introduction and to begin discussions regarding treatment and potential discharge planning.  ? ?Daylen presented with a depressed mood, dysphoric affect, however reports feeling "okay for now". He denied having any SI, HI or AVH at this time.  ? ?Dontario presented to the [facility] seeking treatment for suicidal ideations and ongoing substance abuse issues. During his admission, Cuong reported that he was suicidal with a plan to overdose. He shared that he was "tired of living like this" and wanted to seek professional help.  ? ?Quy has a PMHx of Depression and Bipolar 1 disorder. Harrington reports having multiple suicide attempts in the past, his most recent attempt being him cutting his wrist a couple months ago. Nachmen also shared that he was recently in the [facility] ICU due to a previous accidental overdose that happened two weeks ago. ? ?Leeandre currently lives with his mother and is currently unemployed. He is single and his two teenage daughters. Locklan identifies his addiction issues as his most significant stressor at this time.  ? ?Domanick expressed interest in residential treatment services following his discharge from this facility. Glendal has been started on an Clonidine taper that is scheduled to end on 11/28/2021. Providers will continue to assess the patient's withdrawal symptoms to determine when to end the patient's taper.  ? ?Dymond expressed understanding.  ? ?LCSW will refer the patient to appropriate residential substance abuse programs for review.  ? ?Radonna Ricker, MSW, LCSW ?Clinical Education officer, museum Insurance claims handler) ?[facility]  ? ?

## 2021-11-28 ENCOUNTER — Encounter (HOSPITAL_COMMUNITY): Payer: Self-pay | Admitting: Radiology

## 2021-11-29 ENCOUNTER — Telehealth (HOSPITAL_COMMUNITY): Payer: Self-pay

## 2021-11-29 NOTE — BH Assessment (Addendum)
Care Management - BHUC Follow Up Discharges   Writer attempted to make contact with patient today and was unsuccessful.  Writer left a HIPPA compliant voice message.   Per chart review, patient was provided with substance abuse outpatient resources.  

## 2021-12-22 ENCOUNTER — Other Ambulatory Visit: Payer: Self-pay

## 2021-12-22 ENCOUNTER — Encounter (HOSPITAL_COMMUNITY): Payer: Self-pay | Admitting: Emergency Medicine

## 2021-12-22 ENCOUNTER — Emergency Department (HOSPITAL_COMMUNITY)
Admission: EM | Admit: 2021-12-22 | Discharge: 2021-12-22 | Disposition: A | Discharge status: Home / Self Care | Payer: Self-pay | Attending: Emergency Medicine | Admitting: Emergency Medicine

## 2021-12-22 ENCOUNTER — Ambulatory Visit (HOSPITAL_COMMUNITY)
Admission: RE | Admit: 2021-12-22 | Discharge: 2021-12-22 | Disposition: A | Discharge status: Home / Self Care | Payer: PRIVATE HEALTH INSURANCE | Attending: Psychiatry | Admitting: Psychiatry

## 2021-12-22 DIAGNOSIS — R079 Chest pain, unspecified: Secondary | ICD-10-CM | POA: Insufficient documentation

## 2021-12-22 DIAGNOSIS — R441 Visual hallucinations: Secondary | ICD-10-CM | POA: Insufficient documentation

## 2021-12-22 DIAGNOSIS — R519 Headache, unspecified: Secondary | ICD-10-CM | POA: Insufficient documentation

## 2021-12-22 MED ORDER — LORAZEPAM 1 MG PO TABS
1.0000 mg | ORAL_TABLET | Freq: Once | ORAL | Status: AC
  Administered 2021-12-22: 1 mg via ORAL
  Filled 2021-12-22: qty 1

## 2021-12-22 NOTE — H&P (Signed)
Behavioral Health Medical Screening Exam ? ?Tristan [name] is a 36 y.o. male with psychiatric history of depression, polysubstance abuse (heroin, marijuana, cocaine, meth), and bipolar disorder.  Patient presented voluntarily to [facility] for a walk-in assessment.  Patient presents with chief complaint of increased stress and requesting resources for substance abuse treatment ? ?Patient was seen face-to-face and his chart was reviewed by this NP. During evaluation, patient is drowsy and oriented x4 (per chart review, patient had Ativan 1mg  PO at 18:56 in the ED prior to his arrival at [facility]).  Patient is calm and cooperative. He is speaking in a normal tone of voice at moderate rate with good eye contact.  Patient's mood is depressed with congruent affect.  His thought process is coherent; he did not appear to be responding to any internal/external stimuli or experiencing any delusional thought content during this assessment. ? ?Patient reports that he was recently kicked out of his mother's home due to drug use. He says that he is under stress due to homelessness and drug addiction.  Patient reported that he abuses IV heroin daily.  He says he uses about 1 gram of heroin per day; last use was prior to ED visit today (12/22/21).  Patient was evaluated at [facility] ED prior to this assessment for CP, headache and paranoia. He denies current chest pain. He rates his headache at 5/10, he denies any head injury. He continues to endorse paranoia of "I think someone is putting stuff in the heroine; I'm afraid for my life."  he says that he would like resources for residential substance abuse treatment program.  Patient reported that he has been experiencing auditory hallucination of voices saying " you need to get your life together" and visual hallucination of " bright lights." He denies worsening hallucination; patient says hallucinations has been present for several months. He reports drinking alcohol  occasionally. He says he drank 1 can of 40oz beer today. He denies other substance abuse. He denies suicidal ideation and homicidal ideation.  Patient states that he is able to contract for safety.  Patient reported that he is currently not connected to any outpatient psychiatric provider or therapist.  He reports that he is currently not taking any psychotropics. ? ?Total Time spent with patient: 20 minutes ? ?Psychiatric Specialty Exam: ? ?Presentation  ?General Appearance: Appropriate for Environment ? ?Eye Contact:Good ? ?Speech:Clear and Coherent ? ?Speech Volume:Normal ? ?Handedness:Right ? ? ?Mood and Affect  ?Mood:Euthymic ? ?Affect:Congruent ? ? ?Thought Process  ?Thought Processes:Coherent ? ?Descriptions of Associations:Intact ? ?Orientation:Full (Time, Place and Person) ? ?Thought Content:Paranoid Ideation ? ?History of Schizophrenia/Schizoaffective disorder:No ? ?Duration of Psychotic Symptoms:N/A ? ?Hallucinations:Hallucinations: Visual ? ?Ideas of Reference:Paranoia ? ?Suicidal Thoughts:Suicidal Thoughts: No ? ?Homicidal Thoughts:Homicidal Thoughts: No ? ? ?Sensorium  ?Memory:Immediate Good; Recent Good; Remote Good ? ?Judgment:Fair ? ?Insight:Fair ? ? ?Executive Functions  ?Concentration:Good ? ?Attention Span:Good ? ?Recall:Good ? ?Fund of Knowledge:Good ? ?Language:Good ? ? ?Psychomotor Activity  ?Psychomotor Activity:Psychomotor Activity: Normal ? ? ?Assets  ?Assets:Desire for Improvement; Communication Skills; Physical Health; Social Support ? ? ?Sleep  ?Sleep:Sleep: Fair ?Number of Hours of Sleep: 6 ? ? ? ?Physical Exam: ?Physical Exam ?Vitals reviewed.  ?Constitutional:   ?   General: He is not in acute distress. ?   Appearance: He is not ill-appearing.  ?HENT:  ?   Head: Normocephalic and atraumatic.  ?   Right Ear: External ear normal.  ?   Left Ear: External ear normal.  ?  Nose: Nose normal. No congestion or rhinorrhea.  ?Eyes:  ?   General:     ?   Right eye: No discharge.     ?   Left  eye: No discharge.  ?Cardiovascular:  ?   Rate and Rhythm: Normal rate.  ?   Pulses: Normal pulses.  ?Pulmonary:  ?   Effort: Pulmonary effort is normal. No respiratory distress.  ?   Breath sounds: Normal breath sounds. No wheezing or rhonchi.  ?Chest:  ?   Chest wall: No tenderness.  ?Abdominal:  ?   General: Bowel sounds are normal. There is no distension.  ?Musculoskeletal:     ?   General: Normal range of motion.  ?   Cervical back: Normal range of motion.  ?Skin: ?   General: Skin is warm.  ?Neurological:  ?   Mental Status: He is alert and oriented to person, place, and time.  ?Psychiatric:     ?   Attention and Perception: Attention normal. He is attentive. He perceives auditory and visual hallucinations.     ?   Mood and Affect: Mood is depressed.     ?   Speech: Speech normal.     ?   Behavior: Behavior normal. Behavior is cooperative.     ?   Thought Content: Thought content is paranoid. Thought content is not delusional. Thought content does not include homicidal or suicidal ideation. Thought content does not include homicidal or suicidal plan.     ?   Cognition and Memory: Cognition normal.  ? ?Review of Systems  ?Constitutional: Negative.   ?HENT: Negative.    ?Eyes: Negative.   ?Respiratory: Negative.    ?Cardiovascular: Negative.   ?Gastrointestinal: Negative.   ?Genitourinary: Negative.   ?Musculoskeletal: Negative.   ?Skin: Negative.   ?Neurological: Negative.   ?Endo/Heme/Allergies: Negative.   ?Psychiatric/Behavioral:  Positive for depression, hallucinations and substance abuse. Negative for suicidal ideas. The patient is nervous/anxious.   ?Blood pressure (!) 118/57, pulse 87, resp. rate 14, SpO2 100 %. There is no height or weight on file to calculate BMI. ? ?Musculoskeletal: ?Strength & Muscle Tone: within normal limits ?Gait & Station: normal ?Patient leans: Right ? ? ?Recommendations: ? ?Based on my evaluation the patient does not appear to have an emergency medical condition. ?-Patient was  given outpatient resources for psychiatric providers.  Patient was also given information for [facility] behavioral health urgent care and open access hours/services reviewed with patient.  Patient verbalized understanding. ? ?Maricela Bo, NP ?12/22/2021, 9:23 PM ? ?

## 2021-12-22 NOTE — Discharge Instructions (Addendum)
Please return to the emergency department if you are concerned about chest pain, headache, thoughts of hurting yourself or others or are unable to establish care at behavioral health urgent care facility. ?

## 2021-12-22 NOTE — ED Provider Notes (Signed)
?[facility]-EMERGENCY DEPT ?Provider Note ? ? ?CSN: [id number] ?Arrival date & time: 12/22/21  1748 ? ?  ? ?History ? ?Chief Complaint  ?Patient presents with  ? Chest Pain  ? Headache  ? Paranoid  ? ? ?Tristan [name] is a 36 y.o. male with a past medical history significant for bipolar disorder, major depressive disorder, substance-induced mood disorder, recent admission for overdose with intubation who presents with concern for headache, as well as question that he may have some hallucinations.  He had complained of chest pain in triage, but on my evaluation he denies any chest pain today.  He reports that he received some kind of letter which insinuated that he may have a heart condition and needed further evaluation.  On chart review I do not see any evidence of previously diagnosed heart condition, nothing noted in his ICU admission regarding his heart.  He did receive a twelve-lead in EMS which showed sinus tachycardia with no evidence of any ischemia, he declined further work-up in the emergency department this time.  Patient reports that he drank a 40, and took some CBD today, denies any other drug use.  Patient reports that he does not know what mental conditions he is diagnosed with, does not take any medications at this time. ? ? ?Chest Pain ?Associated symptoms: headache   ?Headache ? ?  ? ?Home Medications ?Prior to Admission medications   ?Medication Sig Start Date End Date Taking? Authorizing Provider  ?FLUoxetine (PROZAC) 20 MG capsule Take 1 capsule (20 mg total) by mouth daily. 11/24/21 12/24/21  Estella Husk, MD  ?   ? ?Allergies    ?Tomato   ? ?Review of Systems   ?Review of Systems  ?Neurological:  Positive for headaches.  ?Psychiatric/Behavioral:  Positive for hallucinations. The patient is nervous/anxious.   ?All other systems reviewed and are negative. ? ?Physical Exam ?Updated Vital Signs ?BP (!) 143/77 (BP Location: Right Arm)   Pulse 98   Temp 98.4 ?F  (36.9 ?C) (Oral)   Resp 18   SpO2 97%  ?Physical Exam ?Vitals and nursing note reviewed.  ?Constitutional:   ?   General: He is not in acute distress. ?   Appearance: Normal appearance.  ?HENT:  ?   Head: Normocephalic and atraumatic.  ?Eyes:  ?   General:     ?   Right eye: No discharge.     ?   Left eye: No discharge.  ?Cardiovascular:  ?   Rate and Rhythm: Normal rate and regular rhythm.  ?   Heart sounds: No murmur heard. ?  No friction rub. No gallop.  ?Pulmonary:  ?   Effort: Pulmonary effort is normal.  ?   Breath sounds: Normal breath sounds.  ?Abdominal:  ?   General: Bowel sounds are normal.  ?   Palpations: Abdomen is soft.  ?Skin: ?   General: Skin is warm and dry.  ?   Capillary Refill: Capillary refill takes less than 2 seconds.  ?Neurological:  ?   Mental Status: He is alert and oriented to person, place, and time.  ?   Comments: Cranial nerves II through XII grossly intact.  Intact finger-nose, intact heel-to-shin.  Romberg negative, gait normal.  Alert and oriented x3.  Moves all 4 limbs spontaneously, normal coordination.  No pronator drift.  Intact strength 5 out of 5 bilateral upper and lower extremities. ? ?Patient with odd affect, flight of ideas, easily redirectable, denies any SI, HI at  this time.  He does endorse some potential audiovisual hallucinations but reports that someone else may have seen what he saw.   ?Psychiatric:     ?   Mood and Affect: Mood normal.     ?   Behavior: Behavior normal.  ? ? ?ED Results / Procedures / Treatments   ?Labs ?(all labs ordered are listed, but only abnormal results are displayed) ?Labs Reviewed - No data to display ? ?EKG ?None ? ?Radiology ?No results found. ? ?Procedures ?Procedures  ? ? ?Medications Ordered in ED ?Medications  ?LORazepam (ATIVAN) tablet 1 mg (1 mg Oral Given 12/22/21 1856)  ? ? ?ED Course/ Medical Decision Making/ A&P ?  ?                        ?Medical Decision Making ?Risk ?Prescription drug management. ? ? ?I discussed this case  with my attending physician who cosigned this note including patient's presenting symptoms, physical exam, and planned diagnostics and interventions. Attending physician stated agreement with plan or made changes to plan which were implemented.  ? ?This is a 36 year old male with a long psychiatric history, previous admissions for drug overdose who presents with concern for headache, he had some report of chest pain in triage but denied chest pain when we spoke.  He denies having chest pain at any point today.  His primary concern is that he seems to be having some possible hallucinations although he is not sure, he does seem to have some flight of ideas which are consistent with unmedicated bipolar disorder with mania.  He denies any SI, HI today.  Performed shared decision making patient discussed that we can do blood work, CT scan of the head with his concern of headache.  He does not have any focal neurologic deficits to suggest an acute finding, I do not have any recent injury.  I have low clinical suspicion for stroke, however patient does have clinical signs and symptoms of some element of psychosis/mania plus or minus possible audiovisual hallucinations.  He is alert and oriented despite his odd affect.  After long discussion discussed with patient that I think that he would be stable to follow-up with behavioral health urgent care.  Patient encouraged to return to the emergency department if he has any health concerns or he would like to talk to our psychiatric team.  Patient understands and agrees to plan, would like to be discharged at this time.  We discussed treatment for possible headache, and patient declined.  He did ask for something for anxiety, provided Ativan 1 mg.  He does endorse some alcohol use prior to arrival but does not seem clinically intoxicated.  He is able to walk a straight line.  He declined any further work-up in the emergency department.  EKG obtained via EMS reported to be sinus  tachycardia although was not able to visualize this.  He has been in normal heart rate and rhythm on auscultation throughout his evaluation.  He declined further EKG evaluation. ?Final Clinical Impression(s) / ED Diagnoses ?Final diagnoses:  ?Acute nonintractable headache, unspecified headache type  ?Hallucination, visual  ? ? ?Rx / DC Orders ?ED Discharge Orders   ? ? None  ? ?  ? ? ?  ?Olene Floss, PA-C ?12/22/21 1958 ? ?  ?Mancel Bale, MD ?12/23/21 1648 ? ?

## 2021-12-22 NOTE — ED Notes (Signed)
Safe transport called to take patient to the [facility]. ?

## 2021-12-22 NOTE — ED Triage Notes (Addendum)
BIBA ?Per EMS: Pt coming from [employer] w/ c/o chest pain that eventually went to his head. Pt has flight of ideas and seems paranoid.  ?Pt states he took something for anxiety  ?Admits to using CBD and ETOH today ?324 aspirin given en route  ?Refused IV w/ EMS  ?126/70  ?110 HR  ?109 CBG  ?100% RA  ?12 lead unremarkable  ?Pt denies chest pain at this time- states he has headache only.  ?

## 2022-01-07 ENCOUNTER — Other Ambulatory Visit (HOSPITAL_COMMUNITY)
Admission: EM | Admit: 2022-01-07 | Discharge: 2022-01-09 | Disposition: A | Discharge status: Home / Self Care | Payer: No Payment, Other | Attending: Psychiatry | Admitting: Psychiatry

## 2022-01-07 ENCOUNTER — Encounter (HOSPITAL_COMMUNITY): Payer: Self-pay | Admitting: Registered Nurse

## 2022-01-07 DIAGNOSIS — B1920 Unspecified viral hepatitis C without hepatic coma: Secondary | ICD-10-CM | POA: Insufficient documentation

## 2022-01-07 DIAGNOSIS — F17200 Nicotine dependence, unspecified, uncomplicated: Secondary | ICD-10-CM | POA: Insufficient documentation

## 2022-01-07 DIAGNOSIS — Z20822 Contact with and (suspected) exposure to covid-19: Secondary | ICD-10-CM | POA: Insufficient documentation

## 2022-01-07 DIAGNOSIS — F1994 Other psychoactive substance use, unspecified with psychoactive substance-induced mood disorder: Secondary | ICD-10-CM | POA: Insufficient documentation

## 2022-01-07 DIAGNOSIS — Z79899 Other long term (current) drug therapy: Secondary | ICD-10-CM | POA: Insufficient documentation

## 2022-01-07 DIAGNOSIS — F1120 Opioid dependence, uncomplicated: Secondary | ICD-10-CM | POA: Diagnosis not present

## 2022-01-07 DIAGNOSIS — F1910 Other psychoactive substance abuse, uncomplicated: Secondary | ICD-10-CM | POA: Diagnosis present

## 2022-01-07 DIAGNOSIS — Z9151 Personal history of suicidal behavior: Secondary | ICD-10-CM | POA: Insufficient documentation

## 2022-01-07 LAB — URINALYSIS, ROUTINE W REFLEX MICROSCOPIC
Bilirubin Urine: NEGATIVE
Glucose, UA: NEGATIVE mg/dL
Hgb urine dipstick: NEGATIVE
Ketones, ur: NEGATIVE mg/dL
Leukocytes,Ua: NEGATIVE
Nitrite: NEGATIVE
Protein, ur: NEGATIVE mg/dL
Specific Gravity, Urine: 1.023 (ref 1.005–1.030)
pH: 6 (ref 5.0–8.0)

## 2022-01-07 LAB — CBC WITH DIFFERENTIAL/PLATELET
Abs Immature Granulocytes: 0.02 10*3/uL (ref 0.00–0.07)
Basophils Absolute: 0 10*3/uL (ref 0.0–0.1)
Basophils Relative: 1 %
Eosinophils Absolute: 0.1 10*3/uL (ref 0.0–0.5)
Eosinophils Relative: 2 %
HCT: 36.7 % — ABNORMAL LOW (ref 39.0–52.0)
Hemoglobin: 12.4 g/dL — ABNORMAL LOW (ref 13.0–17.0)
Immature Granulocytes: 0 %
Lymphocytes Relative: 26 %
Lymphs Abs: 1.6 10*3/uL (ref 0.7–4.0)
MCH: 26.1 pg (ref 26.0–34.0)
MCHC: 33.8 g/dL (ref 30.0–36.0)
MCV: 77.3 fL — ABNORMAL LOW (ref 80.0–100.0)
Monocytes Absolute: 0.4 10*3/uL (ref 0.1–1.0)
Monocytes Relative: 7 %
Neutro Abs: 3.9 10*3/uL (ref 1.7–7.7)
Neutrophils Relative %: 64 %
Platelets: 357 10*3/uL (ref 150–400)
RBC: 4.75 MIL/uL (ref 4.22–5.81)
RDW: 14.8 % (ref 11.5–15.5)
WBC: 6 10*3/uL (ref 4.0–10.5)
nRBC: 0 % (ref 0.0–0.2)

## 2022-01-07 LAB — COMPREHENSIVE METABOLIC PANEL
ALT: 711 U/L — ABNORMAL HIGH (ref 0–44)
AST: 325 U/L — ABNORMAL HIGH (ref 15–41)
Albumin: 4 g/dL (ref 3.5–5.0)
Alkaline Phosphatase: 104 U/L (ref 38–126)
Anion gap: 7 (ref 5–15)
BUN: 10 mg/dL (ref 6–20)
CO2: 29 mmol/L (ref 22–32)
Calcium: 9.6 mg/dL (ref 8.9–10.3)
Chloride: 103 mmol/L (ref 98–111)
Creatinine, Ser: 1.04 mg/dL (ref 0.61–1.24)
GFR, Estimated: >60 mL/min (ref >60)
Glucose, Bld: 119 mg/dL — ABNORMAL HIGH (ref 70–99)
Potassium: 4.9 mmol/L (ref 3.5–5.1)
Sodium: 139 mmol/L (ref 135–145)
Total Bilirubin: 0.7 mg/dL (ref 0.3–1.2)
Total Protein: 6.8 g/dL (ref 6.5–8.1)

## 2022-01-07 LAB — POCT URINE DRUG SCREEN - MANUAL ENTRY (I-SCREEN)
POC Amphetamine UR: POSITIVE — AB
POC Buprenorphine (BUP): NOT DETECTED
POC Cocaine UR: POSITIVE — AB
POC Marijuana UR: POSITIVE — AB
POC Methadone UR: NOT DETECTED
POC Methamphetamine UR: POSITIVE — AB
POC Morphine: NOT DETECTED
POC Oxazepam (BZO): POSITIVE — AB
POC Oxycodone UR: NOT DETECTED
POC Secobarbital (BAR): NOT DETECTED

## 2022-01-07 LAB — RESP PANEL BY RT-PCR (FLU A&B, COVID) ARPGX2
Influenza A by PCR: NEGATIVE
Influenza B by PCR: NEGATIVE
SARS Coronavirus 2 by RT PCR: NEGATIVE

## 2022-01-07 LAB — POC SARS CORONAVIRUS 2 AG: SARSCOV2ONAVIRUS 2 AG: NEGATIVE

## 2022-01-07 LAB — ETHANOL: Alcohol, Ethyl (B): <10 mg/dL (ref <10)

## 2022-01-07 MED ORDER — ALUM & MAG HYDROXIDE-SIMETH 200-200-20 MG/5ML PO SUSP: 30.0000 mL | ORAL | Status: DC | PRN

## 2022-01-07 MED ORDER — TRAZODONE HCL 50 MG PO TABS
50.0000 mg | ORAL_TABLET | Freq: Every evening | ORAL | Status: DC | PRN
  Administered 2022-01-08: 50 mg via ORAL
  Filled 2022-01-07: qty 1

## 2022-01-07 MED ORDER — ONDANSETRON 4 MG PO TBDP: 4.0000 mg | ORAL_TABLET | Freq: Four times a day (QID) | ORAL | Status: DC | PRN

## 2022-01-07 MED ORDER — DICYCLOMINE HCL 20 MG PO TABS: 20.0000 mg | ORAL_TABLET | Freq: Four times a day (QID) | ORAL | Status: DC | PRN

## 2022-01-07 MED ORDER — CLONIDINE HCL 0.1 MG PO TABS
0.1000 mg | ORAL_TABLET | Freq: Four times a day (QID) | ORAL | Status: DC
  Administered 2022-01-07 – 2022-01-08 (×3): 0.1 mg via ORAL
  Filled 2022-01-07 (×3): qty 1

## 2022-01-07 MED ORDER — HYDROXYZINE HCL 25 MG PO TABS: 25.0000 mg | ORAL_TABLET | Freq: Three times a day (TID) | ORAL | Status: DC | PRN

## 2022-01-07 MED ORDER — MAGNESIUM HYDROXIDE 400 MG/5ML PO SUSP
30.0000 mL | Freq: Every day | ORAL | Status: DC | PRN
  Administered 2022-01-08: 30 mL via ORAL
  Filled 2022-01-07: qty 30

## 2022-01-07 MED ORDER — CLONIDINE HCL 0.1 MG PO TABS: 0.1000 mg | ORAL_TABLET | ORAL | Status: DC

## 2022-01-07 MED ORDER — ACETAMINOPHEN 325 MG PO TABS: 650.0000 mg | ORAL_TABLET | Freq: Four times a day (QID) | ORAL | Status: DC | PRN

## 2022-01-07 MED ORDER — CLONIDINE HCL 0.1 MG PO TABS: 0.1000 mg | ORAL_TABLET | Freq: Every day | ORAL | Status: DC

## 2022-01-07 MED ORDER — NAPROXEN 500 MG PO TABS: 500.0000 mg | ORAL_TABLET | Freq: Two times a day (BID) | ORAL | Status: DC | PRN

## 2022-01-07 MED ORDER — LOPERAMIDE HCL 2 MG PO CAPS: 2.0000 mg | ORAL_CAPSULE | ORAL | Status: DC | PRN

## 2022-01-07 MED ORDER — HYDROXYZINE HCL 25 MG PO TABS
25.0000 mg | ORAL_TABLET | Freq: Four times a day (QID) | ORAL | Status: DC | PRN
  Administered 2022-01-08 (×2): 25 mg via ORAL
  Filled 2022-01-07 (×2): qty 1
  Filled 2022-01-07: qty 15

## 2022-01-07 MED ORDER — METHOCARBAMOL 500 MG PO TABS: 500.0000 mg | ORAL_TABLET | Freq: Three times a day (TID) | ORAL | Status: DC | PRN

## 2022-01-07 NOTE — BH Assessment (Signed)
[facility] Assessment Progress Note ? ? ?Per Shuvon Rankin, NP, this voluntary pt does not require psychiatric hospitalization at this time.  Pt is psychiatrically cleared.  Discharge instructions include referral information for area residential substance use disorder treatment providers.  Shuvon has been notified. ? ?Doylene Canning, MA ?Triage Specialist ?[phone] ? ?

## 2022-01-07 NOTE — Discharge Instructions (Addendum)
To help you maintain a sober lifestyle, a substance use disorder treatment program may be beneficial to you.  Contact one of the following providers at your earliest opportunity to ask about enrolling in their program:  ? ?     [facility]  ?     [address]  ?     [place], [place] [address]  ?    [phone]  ? ?     [facility] ?     [address] ?     [place], [place] [address] ?     [phone]  ? ?     [facility]  ?     [address]  ?     [place], [place] [address]  ?     [phone]  ? ?     [facility]  ?     [address]  ?     [place], [place] [address]  ?     [phone]  ? ?     [facility] ?     [address]  ?     [place], [place] [address]  ?     [phone] ?

## 2022-01-07 NOTE — BH Assessment (Signed)
Comprehensive Clinical Assessment (CCA) Note ? ?01/07/2022 ?Tristan [name] Obst ?[id number] ? ?Disposition: Per Shuvon NP, patient is recommended for [facility].  ? ?[place] ED from 01/07/2022 in [facility] ED from 12/22/2021 in [place] DEPT ED from 11/21/2021 in [facility]  ?C-SSRS RISK CATEGORY Moderate Risk No Risk Error: Q3, 4, or 5 should not be populated when Q2 is No  ? ?  ? ?The patient demonstrates the following risk factors for suicide: Chronic risk factors for suicide include: substance use disorder. Acute risk factors for suicide include: unemployment. Protective factors for this patient include: hope for the future. Considering these factors, the overall suicide risk at this point appears to be moderate. Patient is not appropriate for outpatient follow up. ? ?Tristan [name] voluntary to [facility] with [employer]. Per [employer] he met with client earlier today at [facility] and he was making remarks about wanting to play in traffic. Per [employer] pt ran away from him before he was able to get him some help. [employer] reports he saw pt go to the street and cross the street safety at least three times so he did not intervene anymore after that. [employer] reports he received a call from Tristan [name] again this time stating that pt was stealing from the store. [employer] reports this time he told patient that he needed to get some mental health assistance and suggested that he come to [facility]. [employer] states that pt did not make any suicidal statements since seeing him the second time. Pt is reporting SI with plans to use drugs to OD. Pt reports hx of suicide attempts and reports he was admitted to [facility] in 2020. Pt reports injecting a gram of heroin 3 hours ago hoping that he overdose. Pt states "I'm going to end myself before they do". Pt reports HI towards "evil people" no plan or intent. Pt reports AVH of hearing adlibs and seeing things float around. Pt with  inappropriate laughter and presents under the influence of drugs.   ? ?Patient is oriented to person, place, and situation. Patient eye contact is fleeting, speech slurred, presenting with some motor agitation and anxious. Patient reports SI with thoughts to OD on drugs however contracts for safety. Patient reports non-command AH and VH. Patent reports he wants to go to detox and get off drugs. Patient seems motivated for treatment.  ?Chief Complaint:  ?Chief Complaint  ?Patient presents with  ? Addiction Problem  ? ?Visit Diagnosis:  ?Substance induced mood disorder ([place])  ?Heroin use disorder, severe, dependence ([address])  ?Polysubstance abuse ([place])  ?  ? ? ?CCA Screening, Triage and Referral ([facility]) ? ?Patient Reported Information ?How did you hear about [place]? Legal System ? ?What Is the Reason for Your Visit/Call Today? SI, AVH, drug use ? ?How Long Has This Been Causing You Problems? 1-6 months ? ?What Do You Feel Would Help You the Most Today? Treatment for Depression or other mood problem; Alcohol or Drug Use Treatment ? ? ?Have You Recently Had Any Thoughts About Hurting Yourself? Yes ? ?Are You Planning to Commit Suicide/Harm Yourself At This time? Yes ? ? ?Have you Recently Had Thoughts About [place]? Yes ? ?Are You Planning to Harm Someone at This Time? No ? ?Explanation: No data recorded ? ?Have You Used Any Alcohol or Drugs in the Past 24 Hours? Yes ? ?How Long Ago Did You Use Drugs or Alcohol? No data recorded ?What Did You Use and How Much?  gram ? ? ?Do You Currently Have a Therapist/Psychiatrist? No ? ?Name of Therapist/Psychiatrist: No data recorded ? ?Have You Been Recently Discharged From Any Office Practice or Programs? No ? ?Explanation of Discharge From Practice/Program: No data recorded ? ?  ?CCA Screening Triage Referral Assessment ?Type of Contact: Face-to-Face ? ?Telemedicine Service Delivery:   ?Is this Initial or Reassessment? No data recorded ?Date Telepsych consult ordered in [facility]:   No data recorded ?Time Telepsych consult ordered in [facility]:  No data recorded ?Location of Assessment: [facility] Assessment Services ? ?Provider Location: [facility] Assessment Services ? ? ?Collateral Involvement: none ? ? ?Does Patient Have a Stage manager Guardian? No data recorded ?Name and Contact of Legal Guardian: No data recorded ?If Minor and Not Living with Parent(s), Who has Custody? No data recorded ?Is CPS involved or ever been involved? -- Tristan [name]) ? ?Is APS involved or ever been involved? -- Tristan [name]) ? ? ?Patient Determined To Be At Risk for Harm To Self or Others Based on Review of Patient Reported Information or Presenting Complaint? No ? ?Method: No data recorded ?Availability of Means: No data recorded ?Intent: No data recorded ?Notification Required: No data recorded ?Additional Information for Danger to Others Potential: No data recorded ?Additional Comments for Danger to Others Potential: No data recorded ?Are There Guns or Other Weapons in [place]? No data recorded ?Types of Guns/Weapons: No data recorded ?Are These Weapons Safely Secured?                            No data recorded ?Who Could Verify You Are Able To Have These Secured: No data recorded ?Do You Have any Outstanding Charges, Pending Court Dates, Parole/Probation? No data recorded ?Contacted To Inform of Risk of Harm To Self or Others: No data recorded ? ? ?Does Patient Present under Involuntary Commitment? No ? ?IVC Papers Initial File Date: No data recorded ? ?[place] of Residence: Kathleen Argue ? ? ?Patient Currently Receiving the Following Services: Not Receiving Services ? ? ?Determination of Need: Urgent (48 hours) ? ? ?Options For Referral: Outpatient Therapy; Inpatient Hospitalization; Medication Management; Chemical Dependency Intensive Outpatient Therapy (CDIOP); Facility-Based Crisis ? ? ? ? ?CCA Biopsychosocial ?Patient Reported Schizophrenia/Schizoaffective Diagnosis in Past: No ? ? ?Strengths: uta ? ? ?Mental Health  Symptoms ?Depression:   ?Hopelessness; Irritability; Sleep (too much or little) ?  ?Duration of Depressive symptoms:    ?Mania:   ?None ?  ?Anxiety:    ?Worrying; None ?  ?Psychosis:   ?None (some paranoia) ?  ?Duration of Psychotic symptoms:  ?Duration of Psychotic Symptoms: N/A ?  ?Trauma:   ?None ?  ?Obsessions:   ?None ?  ?Compulsions:   ?None ?  ?Inattention:   ?None ?  ?Hyperactivity/Impulsivity:   ?None ?  ?Oppositional/Defiant Behaviors:   ?None ?  ?Emotional Irregularity:   ?Mood lability; Potentially harmful impulsivity; Recurrent suicidal behaviors/gestures/threats; Intense/inappropriate anger ?  ?Other Mood/Personality Symptoms:   ?uta ?  ? ?Mental Status Exam ?Appearance and self-care  ?Stature:   ?Average ?  ?Weight:   ?Average weight ?  ?Clothing:   ?Casual; Neat/clean ?  ?Grooming:   ?Normal ?  ?Cosmetic use:   ?None ?  ?Posture/gait:   ?Normal ?  ?Motor activity:   ?Not Remarkable ?  ?Sensorium  ?Attention:   ?Normal ?  ?Concentration:   ?Normal ?  ?Orientation:   ?Person; Place; Situation; Time ?  ?Recall/memory:   ?Normal ?  ?Affect  and Mood  ?Affect:   ?Anxious ?  ?Mood:   ?Irritable; Anxious ?  ?Relating  ?Eye contact:   ?Fleeting ?  ?Facial expression:   ?Responsive ?  ?Attitude toward examiner:   ?Cooperative ?  ?Thought and Language  ?Speech flow:  ?Pressured; Slurred ?  ?Thought content:   ?Appropriate to Mood and Circumstances ?  ?Preoccupation:   ?None ?  ?Hallucinations:   ?Auditory ?  ?Organization:  No data recorded  ?Executive Functions  ?Fund of Knowledge:   ?Average ?  ?Intelligence:   ?Average ?  ?Abstraction:   ?Functional ?  ?Judgement:   ?Impaired ?  ?Reality Testing:   ?Adequate ?  ?Insight:   ?Fair ?  ?Decision Making:   ?Impulsive ?  ?Social Functioning  ?Social Maturity:   ?Impulsive ?  ?Social Judgement:   ?"Games developer"; Heedless ?  ?Stress  ?Stressors:   ?Family conflict; Housing; Control and instrumentation engineer; Relationship ?  ?Coping Ability:   ?Deficient supports; Overwhelmed;  Exhausted ?  ?Skill Deficits:   ?None Tristan [name]) ?  ?Supports:   ?Family; Support needed ?  ? ? ?Religion: ?Religion/Spirituality ?Are You A Religious Person?: Yes ? ?Leisure/Recreation: ?Leisure / Recreation ?Do You Have Hobbies?:

## 2022-01-07 NOTE — ED Notes (Signed)
STAT lab courier called to transport labs to [facility] lab 

## 2022-01-07 NOTE — BH Assessment (Signed)
[place] voluntary to [facility] with [employer]. Per [employer] he met with client earlier today at [facility] and he was making remarks about wanting to play in traffic. Per [employer] pt ran away from him before he was able to get him some help. [employer] reports he saw pt go to the street and cross the street safety at least three times so he did not intervene anymore after that. [employer] reports he received a call from [place] again this time stating that pt was stealing from the store. [employer] reports this time he told patient that he needed to get some mental health assistance and suggested that he come to [facility]. [employer] states that pt did not make any suicidal statements since seeing him the second time. Pt is reporting SI with plans to use drugs to OD. Pt reports hx of suicide attempts and reports he was admitted to [facility] in 2020. Pt reports injecting a gram of heroin 3 hours ago hoping that he overdose. Pt states "I'm going to end myself before they do". Pt reports HI towards "evil people" no plan or intent. Pt reports AVH of hearing adlibs and seeing things float around. Pt with inappropriate laughter and presents under the influence of drugs.   ?

## 2022-01-07 NOTE — ED Notes (Addendum)
Pt admitted to [facility] due to SI with plan to OD on drugs, AVH, and substance use. Pt A&O x4, calm and cooperative. When asked if pt was experiencing SI today, pt states, "I don't know, probably not, but maybe." Pt denies current SI/HI/AVH. Pt tolerated admission process and skin assessment well. Pt ambulated independently to unit. Oriented to unit/staff. Pt ate chips and drank juice in dining room then went to his room to lay down. No signs of acute distress noted. Will continue to monitor for safety. ?

## 2022-01-07 NOTE — ED Provider Notes (Signed)
BH Urgent Care Continuous Assessment Admission H&P ? ?Date: 01/07/22 ?Patient Name: Tristan [name] ?MRN: [id number] ?Chief Complaint: No chief complaint on file. ?   ? ?Diagnoses:  ?Final diagnoses:  ?Substance induced mood disorder (HCC)  ?Heroin use disorder, severe, dependence (HCC)  ?Polysubstance abuse (HCC)  ? ? ?HPI: History of Present illness: Tristan [name] is a 36 y.o. male patient presented to [facility] as a walk in via [employer] after they were called to [employer] store related to patient stealing.   ?  ?Police reported that patient made a statement that he was having thoughts of walking into traffic earlier today but he was seen crossing the street several times safely without trying to get hit by car.  Police was called to [employer] related to patient stealing. States prior to bring to [facility] patient didn't make any suicidal statement only wanted to get help for substance abuse.   ?  ?Tristan [name], 36 y.o., male patient seen face to face by this provider, consulted with Dr. Earlene Plater; and chart reviewed on 01/07/22.  On evaluation Tristan [name] reports he was brought to [facility] because he was spinning out of control and hearing things.  When asked if he was having suicidal thoughts he states "I was earlier today but not now.  I was at [employer] earlier today and this guy asked if I wanted to be transported here but I didn't want to go.  Later I went back to [employer] I grabbed 2 pieces of shit and the lady was like (' Stop you got to get out of here stealing stuff") and she called the police and they brought me here.  I told them I wanted to get help.  He denies homicidal ideation stating "Only if I have to protect myself."  Patient states that he lives with his mother but has been on the street for several weeks related to her being unable to tolerate his drug use.  States he is able to go to her house for shower and change of  cloths.  "I have a address just been on streets for last couple of weeks."  Patient stating that he is hearing voices; non command and states that he has paranoia sometimes when he is high.  Denies paranoia at this time.  Patient denies outpatient psychiatric services but had services at [facility] one time.  Patient is unemployed ?During evaluation Tristan [name] is sitting at table with arms and head resting on table.  He doesn't appear to be in any distress.  He is alert/oriented x 4; but states he is feeing tired.  He is calm/cooperative; and mood congruent with affect.  He is speaking in a clear tone at moderate volume, and normal pace; with minimal eye contact.  His thought process is coherent and relevant; There is no indication that he is currently responding to internal/external stimuli or experiencing delusional thought content other than his statement that he is hearing voices.  He denies suicidal ideation at this time.  He also denies homicidal ideation and paranoia.  Patient has remained calm throughout assessment and has answered questions appropriately.  Patient is requesting substance use services for detox.  Tristan [name] care coordinator assisting with resources.  [facility] has available beds but need to speak with patient for phone interview.  Patient given phone to complete process. ? ?PHQ 2-9:  ?Flowsheet Row ED from 11/21/2021 in [facility]  ?Thoughts that  you would be better off dead, or of hurting yourself in some way More than half the days  ?PHQ-9 Total Score 16  ? ?  ?  ?Flowsheet Row ED from 12/22/2021 in [facility] from 11/21/2021 in [place] from 10/11/2021 in [facility]  ?C-SSRS RISK CATEGORY No Risk Error: Q3, 4, or 5 should not be populated when Q2 is No No Risk  ? ?  ?  ? ?Total Time spent with patient: 30 minutes ? ?Musculoskeletal  ?Strength & Muscle Tone:  within normal limits ?Gait & Station: normal ?Patient leans: N/A ? ?Psychiatric Specialty Exam  ?Presentation ?General Appearance: Appropriate for Environment; Disheveled ? ?Eye Contact:Minimal ? ?Speech:Clear and Coherent; Slow ? ?Speech Volume:Normal ? ?Handedness:Right ? ? ?Mood and Affect  ?Mood:Dysphoric ? ?Affect:Congruent ? ? ?Thought Process  ?Thought Processes:Coherent; Goal Directed ? ?Descriptions of Associations:Intact ? ?Orientation:Full (Time, Place and Person) ? ?Thought Content:Logical ? Diagnosis of Schizophrenia or Schizoaffective disorder in past: No ?  ?Hallucinations:Hallucinations: Auditory ?Description of Auditory Hallucinations: Reports he is hearing voices but doesn't elaborate on what voices are saying ? ?Ideas of Reference:None (Reports he does have paranoia when he is high.  Denies at this time) ? ?Suicidal Thoughts:Suicidal Thoughts: Yes, Passive ?SI Passive Intent and/or Plan: Without Intent (Reports he had suicidal thoughts earlier today but denies at this time) ? ?Homicidal Thoughts:Homicidal Thoughts: No ? ? ?Sensorium  ?Memory:Immediate Good; Recent Good ? ?Judgment:Fair ? ?Insight:Fair ? ? ?Executive Functions  ?Concentration:Good ? ?Attention Span:Good ? ?Recall:Good ? ?Fund of Knowledge:Good ? ?Language:Good ? ? ?Psychomotor Activity  ?Psychomotor Activity:No data recorded ? ?Assets  ?Assets:Communication Skills; Desire for Improvement; Social Support ? ? ?Sleep  ?Sleep:Sleep: Fair ? ? ?Nutritional Assessment (For OBS and FBC admissions only) ?Has the patient had a weight loss or gain of 10 pounds or more in the last 3 months?: Yes ?Has the patient had a decrease in food intake/or appetite?: Yes ?Does the patient have dental problems?: No ?Does the patient have eating habits or behaviors that may be indicators of an eating disorder including binging or inducing vomiting?: No ?Has the patient recently lost weight without trying?: 0 ?Has the patient been eating poorly because of a  decreased appetite?: 0 ?Malnutrition Screening Tool Score: 0 ? ? ? ?Physical Exam ?Vitals and nursing note reviewed. Exam conducted with a chaperone present.  ?Constitutional:   ?   General: He is not in acute distress. ?   Appearance: Normal appearance. He is not ill-appearing.  ?Cardiovascular:  ?   Rate and Rhythm: Normal rate.  ?Pulmonary:  ?   Effort: Pulmonary effort is normal.  ?Skin: ?   General: Skin is warm and dry.  ?Neurological:  ?   Mental Status: He is alert and oriented to person, place, and time.  ?Psychiatric:     ?   Attention and Perception: Attention and perception normal. He does not perceive auditory (reporting he is hearing voice; but doesn't appear to be responding at this time) or visual hallucinations.     ?   Mood and Affect: Affect normal. Mood is depressed.     ?   Speech: Speech normal.     ?   Behavior: Behavior normal. Behavior is cooperative.     ?   Thought Content: Thought content is not paranoid or delusional. Thought content does not include homicidal ideation. Suicidal: Denies at this time.    ?   Cognition and  Memory: Cognition normal.     ?   Judgment: Judgment is impulsive.  ? ?Review of Systems  ?Constitutional: Negative.   ?HENT: Negative.    ?Eyes: Negative.   ?Respiratory: Negative.    ?Cardiovascular: Negative.   ?Gastrointestinal: Negative.   ?Genitourinary: Negative.   ?Musculoskeletal: Negative.   ?Skin: Negative.   ?Neurological: Negative.   ?Endo/Heme/Allergies: Negative.   ?Psychiatric/Behavioral:  Positive for substance abuse (Heroin.  States his last use was 12:00 PM today). Depression: Stable. Hallucinations: States he is hearing voices but doesn't elaborate on what voices are saying. Suicidal ideas: Made passive statement earlier today but states he doesn't want to die, just wants to get life together.The patient is not nervous/anxious. Insomnia: Sleep is fair about 6 hours daily.  ?     Patient stating that he wants to get life together and get off drugs  because he is spinning out of control  ? ?Blood pressure 122/64, pulse 98, temperature 98.4 ?F (36.9 ?C), temperature source Oral, resp. rate 18, SpO2 100 %. There is no height or weight on file to calculate

## 2022-01-07 NOTE — ED Notes (Signed)
Patient accepted for observation.  He reports depressed mood and heavy drug use in the last 5 days.  He has active track marks on both forearms and was malodorous.  Patients clothes put into washing machine.  He was given shower shower supplies and showered immediately upon arriving on unit.  Patient given scrubs.  He is on COWS and scored 2.  Patient is pleasant, organized and grateful for help offered.  He stated, "I have to stop using.  I can't believe it has gotten this bad. "  He presently denies avh shi or plan appears motivated for treatment.  Patient encouraged to seek out staff if wdl symptoms begin or to have needs met.   ?

## 2022-01-07 NOTE — ED Provider Notes (Signed)
Behavioral Health Urgent Care Medical Screening Exam ? ?Patient Name: Tristan [name] ?MRN: [id number] ?Date of Evaluation: 01/07/22 ?Chief Complaint:   ?Diagnosis:  ?Final diagnoses:  ?Substance induced mood disorder (HCC)  ?Heroin use disorder, severe, dependence (HCC)  ?Polysubstance abuse (HCC)  ? ? ?History of Present illness: Tristan [name] is a 36 y.o. male patient presented to [facility] as a walk in via [facility] Police after they were called to [employer] store related to patient stealing.   ? ?Police reported that patient made a statement that he was having thoughts of walking into traffic earlier today but he was seen crossing the street several times safely without trying to get hit by car.  Police was called to [employer] related to patient stealing. States prior to bring to [facility] patient didn't make any suicidal statement only wanted to get help for substance abuse.   ? ?Tristan [name], 36 y.o., male patient seen face to face by this provider, consulted with Dr. Earlene Plater; and chart reviewed on 01/07/22.  On evaluation Tristan [name] reports he was brought to [facility] because he was spinning out of control and hearing things.  When asked if he was having suicidal thoughts he states "I was earlier today but not now.  I was at [employer] earlier today and this guy asked if I wanted to be transported here but I didn't want to go.  Later I went back to [employer] I grabbed 2 pieces of shit and the lady was like (' Stop you got to get out of here stealing stuff") and she called the police and they brought me here.  I told them I wanted to get help.  He denies homicidal ideation stating "Only if I have to protect myself."  Patient states that he lives with his mother but has been on the street for several weeks related to her being unable to tolerate his drug use.  States he is able to go to her house for shower and change of cloths.  "I have a  address just been on streets for last couple of weeks."  Patient stating that he is hearing voices; non command and states that he has paranoia sometimes when he is high.  Denies paranoia at this time.  Patient denies outpatient psychiatric services but had services at [facility] one time.  Patient is unemployed ?During evaluation Tristan [name] is sitting at table with arms and head resting on table.  He doesn't appear to be in any distress.  He is alert/oriented x 4; but states he is feeing tired.  He is calm/cooperative; and mood congruent with affect.  He is speaking in a clear tone at moderate volume, and normal pace; with minimal eye contact.  His thought process is coherent and relevant; There is no indication that he is currently responding to internal/external stimuli or experiencing delusional thought content other than his statement that he is hearing voices.  He denies suicidal ideation at this time.  He also denies homicidal ideation and paranoia.  Patient has remained calm throughout assessment and has answered questions appropriately.  Patient is requesting substance use services for detox.  [facility] care coordinator assisting with resources.  [facility] has available beds but need to speak with patient for phone interview.  Patient given phone to complete process. ?  ? ?Psychiatric Specialty Exam ? ?Presentation  ?General Appearance:Appropriate for Environment; Disheveled ? ?Eye Contact:Minimal ? ?Speech:Clear and Coherent; Slow ? ?Speech Volume:Normal ? ?  Handedness:Right ? ? ?Mood and Affect  ?Mood:Dysphoric ? ?Affect:Congruent ? ? ?Thought Process  ?Thought Processes:Coherent; Goal Directed ? ?Descriptions of Associations:Intact ? ?Orientation:Full (Time, Place and Person) ? ?Thought Content:Logical ? Diagnosis of Schizophrenia or Schizoaffective disorder in past: No ?  Hallucinations:Auditory ?Reports he is hearing voices but doesn't elaborate on what voices are saying ? ?Ideas of Reference:None  (Reports he does have paranoia when he is high.  Denies at this time) ? ?Suicidal Thoughts:Yes, Passive ?Without Intent (Reports he had suicidal thoughts earlier today but denies at this time) ? ?Homicidal Thoughts:No ? ? ?Sensorium  ?Memory:Immediate Good; Recent Good ? ?Judgment:Fair ? ?Insight:Fair ? ? ?Executive Functions  ?Concentration:Good ? ?Attention Span:Good ? ?Recall:Good ? ?Fund of Knowledge:Good ? ?Language:Good ? ? ?Psychomotor Activity  ?Psychomotor Activity:Normal ? ? ?Assets  ?Assets:Communication Skills; Desire for Improvement; Social Support ? ? ?Sleep  ?Sleep:Fair ? ?Number of hours: 6 ? ? ?Nutritional Assessment (For OBS and FBC admissions only) ?Has the patient had a weight loss or gain of 10 pounds or more in the last 3 months?: Yes ?Has the patient had a decrease in food intake/or appetite?: Yes ?Does the patient have dental problems?: No ?Does the patient have eating habits or behaviors that may be indicators of an eating disorder including binging or inducing vomiting?: No ?Has the patient recently lost weight without trying?: 0 ?Has the patient been eating poorly because of a decreased appetite?: 0 ?Malnutrition Screening Tool Score: 0 ? ? ? ?Physical Exam: ?Physical Exam ?Vitals and nursing note reviewed. Exam conducted with a chaperone present.  ?Constitutional:   ?   General: He is not in acute distress. ?   Appearance: Normal appearance. He is not ill-appearing.  ?Cardiovascular:  ?   Rate and Rhythm: Normal rate.  ?Pulmonary:  ?   Effort: Pulmonary effort is normal.  ?Skin: ?   General: Skin is warm and dry.  ?Neurological:  ?   Mental Status: He is alert and oriented to person, place, and time.  ?Psychiatric:     ?   Attention and Perception: Attention and perception normal. He does not perceive auditory (reporting he is hearing voice; but doesn't appear to be responding at this time) or visual hallucinations.     ?   Mood and Affect: Affect normal. Mood is depressed.     ?    Speech: Speech normal.     ?   Behavior: Behavior normal. Behavior is cooperative.     ?   Thought Content: Thought content is not paranoid or delusional. Thought content does not include homicidal ideation. Suicidal: Denies at this time.    ?   Cognition and Memory: Cognition normal.     ?   Judgment: Judgment is impulsive.  ? ?Review of Systems  ?Constitutional: Negative.   ?HENT: Negative.    ?Eyes: Negative.   ?Respiratory: Negative.    ?Cardiovascular: Negative.   ?Gastrointestinal: Negative.   ?Genitourinary: Negative.   ?Musculoskeletal: Negative.   ?Skin: Negative.   ?Neurological: Negative.   ?Endo/Heme/Allergies: Negative.   ?Psychiatric/Behavioral:  Positive for substance abuse (Heroin.  States his last use was 12:00 PM today). Depression: Stable. Hallucinations: States he is hearing voices but doesn't elaborate on what voices are saying. Suicidal ideas: Made passive statement earlier today but states he doesn't want to die, just wants to get life together.The patient is not nervous/anxious. Insomnia: Sleep is fair about 6 hours daily.  ?     Patient stating that he wants to get  life together and get off drugs because he is spinning out of control  ?Blood pressure 122/64, pulse 98, temperature 98.4 ?F (36.9 ?C), temperature source Oral, resp. rate 18, SpO2 100 %. There is no height or weight on file to calculate BMI. ? ?Musculoskeletal: ?Strength & Muscle Tone: within normal limits ?Gait & Station: normal ?Patient leans: N/A ? ? ?[facility] for Follow up and Recommendations: ?Based on my evaluation the patient does not appear to have an emergency medical condition and can be discharged with resources and follow up care in outpatient services for Medication Management, Substance Abuse Intensive Outpatient Program, Individual Therapy, and Substance use and psychiatric services ? ?Declined [facility] related to positive amphetamine ? ? ?Tanisa Lagace, NP ?01/07/2022, 5:27 PM ? ?

## 2022-01-07 NOTE — ED Notes (Signed)
Pt asleep in bed. Respirations even and unlabored. Will continue to monitor for safety. ?

## 2022-01-08 DIAGNOSIS — F1994 Other psychoactive substance use, unspecified with psychoactive substance-induced mood disorder: Secondary | ICD-10-CM | POA: Diagnosis not present

## 2022-01-08 DIAGNOSIS — F1120 Opioid dependence, uncomplicated: Secondary | ICD-10-CM | POA: Diagnosis not present

## 2022-01-08 DIAGNOSIS — Z20822 Contact with and (suspected) exposure to covid-19: Secondary | ICD-10-CM | POA: Diagnosis not present

## 2022-01-08 DIAGNOSIS — B1920 Unspecified viral hepatitis C without hepatic coma: Secondary | ICD-10-CM | POA: Diagnosis not present

## 2022-01-08 LAB — HEPATITIS PANEL, ACUTE
HCV Ab: REACTIVE — AB
Hep A IgM: NONREACTIVE
Hep B C IgM: NONREACTIVE
Hepatitis B Surface Ag: NONREACTIVE

## 2022-01-08 LAB — GC/CHLAMYDIA PROBE AMP (~~LOC~~) NOT AT ARMC
Chlamydia: NEGATIVE
Comment: NEGATIVE
Comment: NORMAL
Neisseria Gonorrhea: NEGATIVE

## 2022-01-08 MED ORDER — FLUOXETINE HCL 20 MG PO CAPS
20.0000 mg | ORAL_CAPSULE | Freq: Every day | ORAL | Status: DC
  Administered 2022-01-08 – 2022-01-09 (×2): 20 mg via ORAL
  Filled 2022-01-08 (×2): qty 1
  Filled 2022-01-08: qty 14

## 2022-01-08 MED ORDER — FLUOXETINE HCL 20 MG PO CAPS: 20.0000 mg | ORAL_CAPSULE | Freq: Every day | ORAL | 1 refills | Status: DC

## 2022-01-08 MED ORDER — CLONIDINE HCL 0.1 MG PO TABS: 0.1000 mg | ORAL_TABLET | ORAL | Status: DC | PRN

## 2022-01-08 MED ORDER — HYDROXYZINE HCL 25 MG PO TABS: 25.0000 mg | ORAL_TABLET | Freq: Four times a day (QID) | ORAL | 1 refills | Status: DC | PRN

## 2022-01-08 NOTE — ED Notes (Signed)
Writer attempted to draw pt blood from R arm x2, unsuccessful attempt. Pt encouraged to drink plenty of fluids. Safety maintained. ?

## 2022-01-08 NOTE — ED Notes (Signed)
Pt resting in bed. A&O x4, calm and cooperative. Denies current SI/HI/AVH. Denies any current needs. No signs of acute distress noted. Will continue to monitor for safety.  

## 2022-01-08 NOTE — ED Notes (Signed)
Pt calm, cooperative with staff. Denies SI/HI/AVH. Denies withdrawal sx from opiates at present. Breakfast given.Pt request to go to residential rehab after discharge. Pt states, "I've got to stop doing this. I'm not only hurting myself, but my family too". Encouragement provided. Informed pt to notify staff with any needs or concerns. Will continue to monitor for safety. ?

## 2022-01-08 NOTE — ED Notes (Signed)
Pt. Is attending AA. ?

## 2022-01-08 NOTE — Clinical Social Work Psych Note (Signed)
LCSW Initial Note  ? ?LCSW met with Tristan [name] for introduction and to begin discussions regarding treatment and potential discharge planning. Tristan [name] presented with a flat affect, dysphoric mood, however was pleasant and cooperative with this Probation officer.  ? ?Tristan [name] reports he came to the [facility] seeking assistance with his substance use issues. During his initial assessment, Tristan [name] reported being SI with a plan to overdose on drugs. Tristan [name] denied having any SI, HI or AVH at this time. Tristan [name] shared that he wants to receive detox services and referral services for residential substance abuse treatment.  ? ?Tristan [name] endorsed using heroin, methamphetamines, and THC on a daily basis. He also endorsed drinking ETOH occasionally. Tristan [name] reports that he has been using drugs non-stop for the last 4-5 days. He reports he did not eat or sleep much during this 5 day period.   ? ?He reports he typically lives with his mother, however had not spoken to her or any other family supports in a few days due to his substance abuse.  ? ?LCSW referred the patient to [facility] for review. Tristan [name] was scheduled to go to [facility] tomorrow, Wednesday 01/08/22 at 9:00am.  ? ?LCSW will provide a taxi voucher to assist in transporting the patient to [facility].  ? ?Dr. Serafina Mitchell, MD notified ? Tristan Elk, RN notified ? ? ? ?Tristan [name], MSW, LCSW ?Clinical Education officer, museum Insurance claims handler) ?[facility]  ?   ?

## 2022-01-08 NOTE — Group Note (Signed)
Group Topic: Recovery Basics  ?Group Date: 01/08/2022 ?Start Time: 1320 ?End Time: 1340 ?Facilitators: Doyne Keel E  ?Department: [facility] ? ?Number of Participants: 3  ?Group Focus: coping skills and daily focus ?Treatment Modality:  Psychoeducation and Solution-Focused Therapy ?Interventions utilized were patient education ?Purpose: enhance coping skills ? ?Name: Tristan [name] Date of Birth: [birth year]  ?MR: [id number]   ? ?Level of Participation: active ?Quality of Participation: attentive and cooperative ?Interactions with others: gave feedback ?Mood/Affect: appropriate ?Triggers (if applicable): n/a ?Cognition: coherent/clear ?Progress: Moderate ?Response: n/a ?Plan: follow-up needed ? ?Patients Problems:  ?Patient Active Problem List  ? Diagnosis Date Noted  ? Heroin use disorder, severe, dependence (HCC) 01/07/2022  ? Altered mental status 10/27/2021  ? Polysubstance abuse (HCC)   ? Lactic acidosis   ? Acute respiratory failure with hypoxia and hypercapnia (HCC)   ? AKI (acute kidney injury) (HCC)   ? Substance induced mood disorder (HCC) 09/29/2019  ? MDD (major depressive disorder), recurrent episode, severe (HCC) 02/20/2019  ? Polysubstance abuse (HCC) 01/29/2019  ? MDD (major depressive disorder), severe (HCC) 01/27/2019  ? Suicide attempt [facility])   ? Bipolar 1 disorder, mixed (HCC) 05/02/2014  ?  ?

## 2022-01-08 NOTE — ED Notes (Signed)
Pt sleeping@this time. Breathing even and unlabored will continue to monitor for safety 

## 2022-01-08 NOTE — ED Notes (Signed)
Pt eating lunch in no acute distress. Safety maintained. 

## 2022-01-08 NOTE — ED Notes (Signed)
Pt asleep in bed. Respirations even and unlabored. Will continue to monitor for safety. ?

## 2022-01-08 NOTE — ED Provider Notes (Addendum)
Behavioral Health Progress Note ? ?Date and Time: 01/08/2022 12:41 PM ?Name: Tristan [name] ?MRN:  [id number] ? ?Subjective:   ?Tristan [name] is a 36 y.o. male with history of polysubstance abuse, MDD, SIMD, self reported bipolar disorder who presented to [facility] on 01/07/22 as a walk in via [facility] after they were called to [employer] store related to patient stealing.  Upon police arriving patient reportedly made suicidal comments and was brought to the [facility] for evaluation. He was subsequently admitted to the [facility] for crisis stabilization. UDS+amphetamine, benzodiazepines, cocaine, meth, marijuana. ? ?Per chart review, patient has been admitted to [facility] in the past as well as to the [facility]. Patient was admitted to [facility] in Janaruy 2021 with dx of MDD, polysubstance abuse and SIMD. He was discharged with prozac 10 mg, hydroxyzine 25 mg q6 hrs prn and quetiapine 50 mg qhs. He was admitted to the [facility] on 11/21/2021 and discharged on 11/23/21 per request; he was discharged with prozac 20 mg. ? ?Patient seen in conjunction with LCSW this AM. Prior to interview, patient observed in the dining area watching TV in NAD. On interview patient is calm, cooperative and pleasant. Patient reports his reason for presentation as "I'm trying to gain better control of my substance use and mental health". Patient states that he was released from prison in November 2022 after serving 1.5 years. He states that after his release he was staying with his mother and was working; however, he describes increasing his substance use since January and has not been staying with her recently d/t continued substance use. Patient reports using meth, heroin and cannabis on a daily basis. He states that he had multiple unintentional overdoses "At the beginning of the year" and states that he had to receive narcan multiple times.He reports feeling worthless and expresses that he feels like he is an  embarrassment. Patient recalls recent [facility] admission and indicates that he has not been medication compliant since discharge. He states that he is interested in residential substance use treatment. He denies SI/IH/AVH. He is agreeable to restart previously prescribed prozac. Patient states that he feels anxious and "paranoid" at times and expresses that he feels like people are talking about him and judging him. Discussed anxiety, paranoia, and substance use and how it may be interconnected. Patient verbalized understanding. Discussed results of UDS (no evidence of opiods present) and patient states that due to his frequent overdoses at the beginning of the year there is a possibility that he is not being sold opiates.Discussed clonidine taper risks/benefits- BP under good control currently and per chart review patient experienced lightheadedness- after discussion patient is agreeable to this medication being available PRN. Patient requests referral to residential rehab and states that he has a Research officer, trade union that he will get into contact with and notify.  ? ?LFTs noticeably elevated; discussed with patient recommendation to obtain acute hepatitis panel-patient in agreement and requests HIV testing. ? ?Obtained from patient and chart review and patient interview ?Past Psychiatric History: ?Previous Medication Trials: Remeron, Zyprexa, Seroquel, prozac ?Previous Psychiatric Hospitalizations: 6 x since 2020 ?Previous Suicide Attempts: yes - x 4; 3 of which involve overdosing on substances ?History of Violence: no ?Outpatient psychiatrist: no ?  ?Social History: ?Children: 2 ?Source of Income: not currently employed ?Housing Status: homeless; previously was living with his mother  ?  ?  ?Substance Use (with emphasis over the last 12 months) ?Recreational Drugs: daily meth, heroin and cannabis ?Use  of Alcohol: rare ?Rehab History: denies ?H/O Complicated Withdrawal: no ?  ?Legal History: ?Past Charges/Incarcerations:  yes, recently released from prison in November 2022 after serving 1.5 years. States he has a Research officer, trade union ?Pending charges: denies ?  ?Family Psychiatric History: ?yes - unsure what diagnoses they have but describes family members being hospitalized. Per chart review father with h/o mental illness ?  ? ? ? ? ?Diagnosis:  ?Final diagnoses:  ?Substance induced mood disorder ([place])  ?Heroin use disorder, severe, dependence ([place])  ?Polysubstance abuse ([place])  ? ? ?Total Time spent with patient: 30 minutes ? ?Past Psychiatric History: MDD, PTSD ?Past Medical History:  ?Past Medical History:  ?Diagnosis Date  ? Bipolar 1 disorder ([place])   ? Hypertension   ? PTSD (post-traumatic stress disorder)   ?  ?Past Surgical History:  ?Procedure Laterality Date  ? NO PAST SURGERIES    ? ?Family History:  ?Family History  ?Problem Relation Age of Onset  ? Other Father   ? Psychiatric Illness Father   ? ?Family Psychiatric  History:  ?yes - unsure what diagnoses they have but describes family members being hospitalized. Per chart review father with h/o mental illness ?  ?Social History:  ?Social History  ? ?Substance and Sexual Activity  ?Alcohol Use Never  ?   ?Social History  ? ?Substance and Sexual Activity  ?Drug Use Yes  ? Types: Marijuana, Heroin, Cocaine  ? Comment: denies drug use  ?  ?Social History  ? ?Socioeconomic History  ? Marital status: Single  ?  Spouse name: Not on file  ? Number of children: Not on file  ? Years of education: Not on file  ? Highest education level: Not on file  ?Occupational History  ? Not on file  ?Tobacco Use  ? Smoking status: Every Day  ?  Packs/day: 0.50  ?  Types: Cigarettes  ? Smokeless tobacco: Never  ?Vaping Use  ? Vaping Use: Not on file  ?Substance and Sexual Activity  ? Alcohol use: Never  ? Drug use: Yes  ?  Types: Marijuana, Heroin, Cocaine  ?  Comment: denies drug use  ? Sexual activity: Not Currently  ?Other Topics Concern  ? Not on file  ?Social History Narrative  ? ** Merged History  Encounter **  ?    ? ?Social Determinants of Health  ? ?Financial Resource Strain: Not on file  ?Food Insecurity: Not on file  ?Transportation Needs: Not on file  ?Physical Activity: Not on file  ?Stress: Not on file  ?Social Connections: Not on file  ? ?SDOH:  ?SDOH Screenings  ? ?Alcohol Screen: Not on file  ?Depression (PHQ2-9): Medium Risk  ? PHQ-2 Score: 17  ?Financial Resource Strain: Not on file  ?Food Insecurity: Not on file  ?Housing: Not on file  ?Physical Activity: Not on file  ?Social Connections: Not on file  ?Stress: Not on file  ?Tobacco Use: High Risk  ? Smoking Tobacco Use: Every Day  ? Smokeless Tobacco Use: Never  ? Passive Exposure: Not on file  ?Transportation Needs: Not on file  ? ?Additional Social History:  ?  ?Pain Medications: see MAR ?Prescriptions: see MAR ?Over the Counter: see MAR ?History of alcohol / drug use?: Yes (Pt reports using heroin, crack, meth, THC and alcohol) ?Longest period of sobriety (when/how long): unknown ?  ?2 - Age of First Use: 29 ?2 - Amount (size/oz): unknown ?2 - Frequency: daily ?2 - Duration: ongoing ?2 - Last Use / Amount:  yesterday ?Name of Substance 3: cocaine ?3 - Age of First Use: 19 ?3 - Amount (size/oz): varies ?3 - Frequency: 3-4 times a week ?3 - Last Use / Amount: 2 days ago ?Name of Substance 4: alcohol ?4 - Age of First Use: 13 ?4 - Amount (size/oz): pint of liquor ?4 - Frequency: 1-2 times a week ?4 - Duration: ongoing ?4 - Last Use / Amount: "a few days ago" ?  ?  ?  ?  ?  ? ?Sleep: Fair ? ?Appetite:  Fair ? ?Current Medications:  ?Current Facility-Administered Medications  ?Medication Dose Route Frequency Provider Last Rate Last Admin  ? acetaminophen (TYLENOL) tablet 650 mg  650 mg Oral Q6H PRN Rankin, Shuvon B, NP      ? alum & mag hydroxide-simeth (MAALOX/MYLANTA) 200-200-20 MG/5ML suspension 30 mL  30 mL Oral Q4H PRN Rankin, Shuvon B, NP      ? cloNIDine (CATAPRES) tablet 0.1 mg  0.1 mg Oral Q4H PRN Ival Bible, MD      ?  dicyclomine (BENTYL) tablet 20 mg  20 mg Oral Q6H PRN Rankin, Shuvon B, NP      ? FLUoxetine (PROZAC) capsule 20 mg  20 mg Oral Daily Ival Bible, MD   20 mg at 01/08/22 1101  ? hydrOXYzine (ATARAX) tablet

## 2022-01-08 NOTE — ED Notes (Signed)
Pt sleeping in no acute distress. RR even and unlabored. Safety maintained. 

## 2022-01-08 NOTE — ED Notes (Signed)
Pt allowed for blood to be drawn this writer attempted x1 and was unable to collect speciman.  Second nurse also attempted and was unable to obtain blood.  Provider made aware. Pt encouraged to drink fluids.  Will continue to monitor for safety  ?

## 2022-01-09 ENCOUNTER — Telehealth: Payer: Self-pay

## 2022-01-09 ENCOUNTER — Other Ambulatory Visit (HOSPITAL_COMMUNITY): Payer: Self-pay

## 2022-01-09 DIAGNOSIS — F1120 Opioid dependence, uncomplicated: Secondary | ICD-10-CM | POA: Diagnosis not present

## 2022-01-09 DIAGNOSIS — F1994 Other psychoactive substance use, unspecified with psychoactive substance-induced mood disorder: Secondary | ICD-10-CM | POA: Diagnosis not present

## 2022-01-09 DIAGNOSIS — Z20822 Contact with and (suspected) exposure to covid-19: Secondary | ICD-10-CM | POA: Diagnosis not present

## 2022-01-09 DIAGNOSIS — B1920 Unspecified viral hepatitis C without hepatic coma: Secondary | ICD-10-CM | POA: Diagnosis not present

## 2022-01-09 NOTE — Discharge Summary (Signed)
Tristan [name] to be D/C'd  Tristan [name]  per MD order. Discussed with the patient and all questions fully answered. An After Visit Summary was printed and given to the patient. Medication samples and scripts were also given to patient. Patient escorted out and D/C home via taxi cab.  ?Seng Fouts  Marquis Lunch  ?01/09/2022 8:31 AM ?  ?   ?

## 2022-01-09 NOTE — Progress Notes (Signed)
Pt is awake, alert and oriented. Pt did not voice any complaints of pain or discomfort. No signs of acute distress noted. Administered scheduled med with no issue. Pt denies pain and current SI/HI/AVH. Staff will monitor for pt's safety. ?

## 2022-01-09 NOTE — Telephone Encounter (Signed)
[facility] Patient Advocate Encounter  Insurance verification completed.    The patient is insured through [employer].  Medication will need a PA.  We will continue to follow to see if copay assistance is needed.  [name], CPhT [facility] Patient Advocate [facility] Phone: [phone] Fax:  [phone]  

## 2022-01-09 NOTE — ED Notes (Signed)
Pt sleeping@this time. Breathing even ad unlabored. Will continue to monitor for safety ?

## 2022-01-09 NOTE — ED Provider Notes (Addendum)
FBC/OBS ASAP Discharge Summary ? ?Date and Time: 01/09/2022 8:16 AM  ?Name: Tristan [name]  ?MRN:  [id number]  ? ?Discharge Diagnoses:  ?Final diagnoses:  ?Substance induced mood disorder (HCC)  ?Heroin use disorder, severe, dependence (HCC)  ?Polysubstance abuse (HCC)  ?Hepatitis C virus infection without hepatic coma, unspecified chronicity  ? ? ?Subjective:  ?Patient seen and chart reviewed-patient has been medication compliant and has been appropriate with staff and peers on the unit. Discussed hepatitis C results and provided education regarding hepatitis C and need to follow up with infectious disease for further treatment. Patient denied SI/HI/AVH. He reported that he would [facility] to [facility] for [facility]hab. Patient informed that he will be provided with [facility] and will be provided with paper scripts and 14 day samples. ? ?Stay Summary:  ?Tristan [name] is a 36 y.o. male with history of polysubstance abuse, MDD, SIMD, self reported bipolar disorder who [facility] after they were called to [employer] store related to patient stealing.  Upon police arriving patient reportedly made suicidal comments and [facility]. UDS+amphetamine, benzodiazepines, cocaine, meth, marijuana. ?Patient was restarted on previous medication of prozac 20 mg upon admission and placed on COWS protocol for opiate withdrawal d/t reported opiate use. Initially a clonidine taper was started on admission; however, this was discontinued per request. COWS score ranged from 8-0 throughout admission; scores were consistently 0 towards end of admission. LFTs were elevated and hepatitis panel was ordered-results revealed hepatitis C. [facility] for substance use treatment. Prior to discharge discussed  diagnosis of hepatitis C and patient was provided information regarding hepatitis C-discussed that information for follow up with infectious disease has also been placed in AVS. Patient verbalized understanding.  ? ?PHQ9 scored decreased from 17 to 9 by day of discharge. ? ?Patient had requested HIV testing; however, staff were unable to obtain labs despite multiple attempts. Patient may inquire about HIV testing at Infectious diseases appointment/with PCP ? ?On my interview, patient is in NAD, alert, oriented, calm, cooperative, and attentive, with normal affect, speech, and behavior. Objectively, there is no evidence of psychosis/ mania (able to converse coherently, linear and goal directed thought, no RIS, no distractibility, not pre-occupied, no FOI, etc) nor depression to the point of suicidality (able to concentrate, affect full and reactive, speech normal r/v/t, no psychomotor retardation/agitation, etc). Patient denied SI/HI/AVH. ? ?Overall, patient appears to be at the point, in the absence of inhibiting or disinhibiting symptoms, where he can successfully move to lesser restrictive setting for care. ? ? ?Total Time spent with patient: 20 minutes ? ?Past Psychiatric History: MDD, SIMD, polysubstance abuse, PTSD, bipolar disorder ?Past Medical History:  ?Past Medical History:  ?Diagnosis Date  ? Bipolar 1 disorder (HCC)   ? Hypertension   ? PTSD (post-traumatic stress disorder)   ?  ?Past Surgical History:  ?Procedure Laterality Date  ? NO PAST SURGERIES    ? ?Family History:  ?Family History  ?Problem Relation Age of Onset  ? Other Father   ? Psychiatric Illness Father   ? ?Family Psychiatric History:  ?unsure what diagnoses they have but describes family members being hospitalized. Per chart review father with h/o mental illness ?Social History:  ?Social History  ? ?Substance and Sexual Activity  ?Alcohol Use Never  ?   ?  Social History  ? ?Substance and Sexual Activity  ?Drug Use Yes  ? Types: Marijuana,  Heroin, Cocaine  ? Comment: denies drug use  ?  ?Social History  ? ?Socioeconomic History  ? Marital status: Single  ?  Spouse name: Not on file  ? Number of children: Not on file  ? Years of education: Not on file  ? Highest education level: Not on file  ?Occupational History  ? Not on file  ?Tobacco Use  ? Smoking status: Every Day  ?  Packs/day: 0.50  ?  Types: Cigarettes  ? Smokeless tobacco: Never  ?Vaping Use  ? Vaping Use: Not on file  ?Substance and Sexual Activity  ? Alcohol use: Never  ? Drug use: Yes  ?  Types: Marijuana, Heroin, Cocaine  ?  Comment: denies drug use  ? Sexual activity: Not Currently  ?Other Topics Concern  ? Not on file  ?Social History Narrative  ? ** Merged History Encounter **  ?    ? ?Social Determinants of Health  ? ?Financial Resource Strain: Not on file  ?Food Insecurity: Not on file  ?Transportation Needs: Not on file  ?Physical Activity: Not on file  ?Stress: Not on file  ?Social Connections: Not on file  ? ?SDOH:  ?SDOH Screenings  ? ?Alcohol Screen: Not on file  ?Depression (PHQ2-9): Medium Risk  ? PHQ-2 Score: 17  ?Financial Resource Strain: Not on file  ?Food Insecurity: Not on file  ?Housing: Not on file  ?Physical Activity: Not on file  ?Social Connections: Not on file  ?Stress: Not on file  ?Tobacco Use: High Risk  ? Smoking Tobacco Use: Every Day  ? Smokeless Tobacco Use: Never  ? Passive Exposure: Not on file  ?Transportation Needs: Not on file  ? ? ?Tobacco Cessation:  Prescription not provided because: declined ? ?Current Medications:  ?Current Facility-Administered Medications  ?Medication Dose Route Frequency Provider Last Rate Last Admin  ? acetaminophen (TYLENOL) tablet 650 mg  650 mg Oral Q6H PRN Rankin, Shuvon B, NP      ? alum & mag hydroxide-simeth (MAALOX/MYLANTA) 200-200-20 MG/5ML suspension 30 mL  30 mL Oral Q4H PRN Rankin, Shuvon B, NP      ? cloNIDine (CATAPRES) tablet 0.1 mg  0.1 mg Oral Q4H PRN Estella Husk, MD      ? dicyclomine (BENTYL) tablet  20 mg  20 mg Oral Q6H PRN Rankin, Shuvon B, NP      ? FLUoxetine (PROZAC) capsule 20 mg  20 mg Oral Daily Estella Husk, MD   20 mg at 01/09/22 3662  ? hydrOXYzine (ATARAX) tablet 25 mg  25 mg Oral Q6H PRN Rankin, Shuvon B, NP   25 mg at 01/08/22 2108  ? loperamide (IMODIUM) capsule 2-4 mg  2-4 mg Oral PRN Rankin, Shuvon B, NP      ? magnesium hydroxide (MILK OF MAGNESIA) suspension 30 mL  30 mL Oral Daily PRN Rankin, Shuvon B, NP   30 mL at 01/08/22 2112  ? methocarbamol (ROBAXIN) tablet 500 mg  500 mg Oral Q8H PRN Rankin, Shuvon B, NP      ? naproxen (NAPROSYN) tablet 500 mg  500 mg Oral BID PRN Rankin, Shuvon B, NP      ? ondansetron (ZOFRAN-ODT) disintegrating tablet 4 mg  4 mg Oral Q6H PRN Rankin, Shuvon B, NP      ? traZODone (DESYREL) tablet 50 mg  50 mg Oral QHS PRN Rankin, Shuvon B, NP   50  mg at 01/08/22 2108  ? ?Current Outpatient Medications  ?Medication Sig Dispense Refill  ? FLUoxetine (PROZAC) 20 MG capsule Take 1 capsule (20 mg total) by mouth daily. 30 capsule 1  ? hydrOXYzine (ATARAX) 25 MG tablet Take 1 tablet (25 mg total) by mouth every 6 (six) hours as needed for anxiety. 30 tablet 1  ? ? ?PTA Medications: (Not in a hospital admission) ? ? ?Musculoskeletal  ?Strength & Muscle Tone: within normal limits ?Gait & Station: normal ?Patient leans: N/A ? ?Psychiatric Specialty Exam  ?Presentation  ?General Appearance: Appropriate for Environment; Casual ? ?Eye Contact:Fair ? ?Speech:Clear and Coherent; Normal Rate ? ?Speech Volume:Normal ? ?Handedness:Right ? ? ?Mood and Affect  ?Mood:Dysphoric; Depressed ? ?Affect:Appropriate; Congruent ? ? ?Thought Process  ?Thought Processes:Coherent; Goal Directed; Linear ? ?Descriptions of Associations:Intact ? ?Orientation:Full (Time, Place and Person) ? ?Thought Content:WDL; Logical ? Diagnosis of Schizophrenia or Schizoaffective disorder in past: No ?  ? Hallucinations:Hallucinations: None ? ?Ideas of Reference:None ? ?Suicidal Thoughts:Suicidal  Thoughts: No ? ?Homicidal Thoughts:Homicidal Thoughts: No ? ? ?Sensorium  ?Memory:Immediate Good; Recent Good; Remote Fair ? ?Judgment:Fair ? ?Insight:Fair ? ? ?Executive Functions  ?Concentration:Good ? ?Attention

## 2022-01-10 ENCOUNTER — Encounter: Payer: Self-pay | Admitting: Family

## 2022-01-14 ENCOUNTER — Emergency Department (HOSPITAL_COMMUNITY)
Admission: EM | Admit: 2022-01-14 | Discharge: 2022-01-16 | Disposition: A | Discharge status: Other Acute Inpatient Hospital | Payer: PRIVATE HEALTH INSURANCE | Attending: Emergency Medicine | Admitting: Emergency Medicine

## 2022-01-14 DIAGNOSIS — R45851 Suicidal ideations: Secondary | ICD-10-CM

## 2022-01-14 DIAGNOSIS — D649 Anemia, unspecified: Secondary | ICD-10-CM | POA: Insufficient documentation

## 2022-01-14 DIAGNOSIS — Z79899 Other long term (current) drug therapy: Secondary | ICD-10-CM | POA: Insufficient documentation

## 2022-01-14 DIAGNOSIS — Z20822 Contact with and (suspected) exposure to covid-19: Secondary | ICD-10-CM | POA: Insufficient documentation

## 2022-01-14 DIAGNOSIS — Z046 Encounter for general psychiatric examination, requested by authority: Secondary | ICD-10-CM | POA: Insufficient documentation

## 2022-01-14 DIAGNOSIS — Y900 Blood alcohol level of less than 20 mg/100 ml: Secondary | ICD-10-CM | POA: Insufficient documentation

## 2022-01-14 DIAGNOSIS — F1910 Other psychoactive substance abuse, uncomplicated: Secondary | ICD-10-CM | POA: Diagnosis present

## 2022-01-14 DIAGNOSIS — F322 Major depressive disorder, single episode, severe without psychotic features: Secondary | ICD-10-CM | POA: Diagnosis present

## 2022-01-14 DIAGNOSIS — I10 Essential (primary) hypertension: Secondary | ICD-10-CM | POA: Insufficient documentation

## 2022-01-15 ENCOUNTER — Encounter (HOSPITAL_COMMUNITY): Payer: Self-pay

## 2022-01-15 DIAGNOSIS — F1910 Other psychoactive substance abuse, uncomplicated: Secondary | ICD-10-CM

## 2022-01-15 LAB — CBC WITH DIFFERENTIAL/PLATELET
Abs Immature Granulocytes: 0.01 10*3/uL (ref 0.00–0.07)
Basophils Absolute: 0 10*3/uL (ref 0.0–0.1)
Basophils Relative: 1 %
Eosinophils Absolute: 0.1 10*3/uL (ref 0.0–0.5)
Eosinophils Relative: 3 %
HCT: 32.1 % — ABNORMAL LOW (ref 39.0–52.0)
Hemoglobin: 11 g/dL — ABNORMAL LOW (ref 13.0–17.0)
Immature Granulocytes: 0 %
Lymphocytes Relative: 32 %
Lymphs Abs: 1.6 10*3/uL (ref 0.7–4.0)
MCH: 25.9 pg — ABNORMAL LOW (ref 26.0–34.0)
MCHC: 34.3 g/dL (ref 30.0–36.0)
MCV: 75.7 fL — ABNORMAL LOW (ref 80.0–100.0)
Monocytes Absolute: 0.5 10*3/uL (ref 0.1–1.0)
Monocytes Relative: 11 %
Neutro Abs: 2.6 10*3/uL (ref 1.7–7.7)
Neutrophils Relative %: 53 %
Platelets: 323 10*3/uL (ref 150–400)
RBC: 4.24 MIL/uL (ref 4.22–5.81)
RDW: 14.8 % (ref 11.5–15.5)
WBC: 4.9 10*3/uL (ref 4.0–10.5)
nRBC: 0 % (ref 0.0–0.2)

## 2022-01-15 LAB — BASIC METABOLIC PANEL
Anion gap: 5 (ref 5–15)
BUN: 16 mg/dL (ref 6–20)
CO2: 27 mmol/L (ref 22–32)
Calcium: 9 mg/dL (ref 8.9–10.3)
Chloride: 107 mmol/L (ref 98–111)
Creatinine, Ser: 1.09 mg/dL (ref 0.61–1.24)
GFR, Estimated: >60 mL/min (ref >60)
Glucose, Bld: 97 mg/dL (ref 70–99)
Potassium: 3.8 mmol/L (ref 3.5–5.1)
Sodium: 139 mmol/L (ref 135–145)

## 2022-01-15 LAB — URINALYSIS, ROUTINE W REFLEX MICROSCOPIC
Bilirubin Urine: NEGATIVE
Glucose, UA: NEGATIVE mg/dL
Hgb urine dipstick: NEGATIVE
Ketones, ur: NEGATIVE mg/dL
Leukocytes,Ua: NEGATIVE
Nitrite: NEGATIVE
Protein, ur: NEGATIVE mg/dL
Specific Gravity, Urine: 1.02 (ref 1.005–1.030)
pH: 6 (ref 5.0–8.0)

## 2022-01-15 LAB — RAPID URINE DRUG SCREEN, HOSP PERFORMED
Amphetamines: POSITIVE — AB
Barbiturates: NOT DETECTED
Benzodiazepines: POSITIVE — AB
Cocaine: POSITIVE — AB
Opiates: NOT DETECTED
Tetrahydrocannabinol: POSITIVE — AB

## 2022-01-15 LAB — RESP PANEL BY RT-PCR (FLU A&B, COVID) ARPGX2
Influenza A by PCR: NEGATIVE
Influenza B by PCR: NEGATIVE
SARS Coronavirus 2 by RT PCR: NEGATIVE

## 2022-01-15 LAB — ETHANOL: Alcohol, Ethyl (B): <10 mg/dL (ref <10)

## 2022-01-15 MED ORDER — FLUOXETINE HCL 20 MG PO CAPS
20.0000 mg | ORAL_CAPSULE | Freq: Every day | ORAL | Status: DC
  Administered 2022-01-15: 20 mg via ORAL
  Filled 2022-01-15: qty 1

## 2022-01-15 MED ORDER — HYDROXYZINE HCL 25 MG PO TABS
25.0000 mg | ORAL_TABLET | Freq: Four times a day (QID) | ORAL | Status: DC | PRN
  Administered 2022-01-15 (×2): 25 mg via ORAL
  Filled 2022-01-15 (×2): qty 1

## 2022-01-15 NOTE — ED Notes (Signed)
Patient given water to drink.  

## 2022-01-15 NOTE — Consult Note (Addendum)
[facility] Consult  ? ?Reason for Consult:  Psychiatric Evaluation ?Referring Physician:  ER Physician ?Patient Identification: Tristan [name] ?MRN:  [id number] ?Principal Diagnosis: MDD (major depressive disorder), severe (HCC) ?Diagnosis:  Principal Problem: ?  MDD (major depressive disorder), severe (HCC) ?Active Problems: ?  Polysubstance abuse (HCC) ? ? ?Total Time spent with patient: 30 minutes ? ?Subjective:   ?Tristan [name] is a 36 y.o. male patient admitted with hx of .Bipolar disorder, PTSD, Polysubstance abuse brought in to the ER by EMS with Altered Mental status was seen running around the streets.  ? ?HPI:  Patient made minimal eye contact with provider this morning.  He is a poor historian and was not forthcoming with information.  [facility].  Patient reported this morning that he is homeless, depressed, Hopeless and helpless.  He reported that [facility] but was turned away because he did not bring enough of his daily Medications.  He went to a bridge last night thinking of jumping off the bridge but some body instead injected him with a substance telling him the will make him feel good.Marland Kitchen.  He  then saw the Police who brought him to the hospital.  He remains suicidal and his plan is to make Police shot and kill him.  He is not happy with his life stating that he has been trying to get off drugs but the system does not give him help.  [facility] but stated that he was let go so fast without care.  He reported previous suicide attempts in the past and stated that he will not hesitate to find  a way to kill him serlf.  He also reported hearing voices telling him evil things.  His UDS is positive for Cocaine, Benzo's, Marijuana and Methamphetamine.  He meets criteria for inpatient hospitalization.  We will fax out records seeking bed placement. ? ?Past Psychiatric  History: Bipolar disorder, PTSD, Polysubstance abuse. ? ?Risk to Self:   ?Risk to Others:   ?Prior Inpatient Therapy:   ?Prior Outpatient Therapy:   ? ?Past Medical History:  ?Past Medical History:  ?Diagnosis Date  ? Bipolar 1 disorder (HCC)   ? Hypertension   ? PTSD (post-traumatic stress disorder)   ?  ?Past Surgical History:  ?Procedure Laterality Date  ? NO PAST SURGERIES    ? ?Family History:  ?Family History  ?Problem Relation Age of Onset  ? Other Father   ? Psychiatric Illness Father   ? ?Family Psychiatric  History: Father with mental illness but un kwon diagnosis  ?Social History:  ?Social History  ? ?Substance and Sexual Activity  ?Alcohol Use Never  ?   ?Social History  ? ?Substance and Sexual Activity  ?Drug Use Yes  ? Types: Marijuana, Heroin, Cocaine  ? Comment: denies drug use  ?  ?Social History  ? ?Socioeconomic History  ? Marital status: Single  ?  Spouse name: Not on file  ? Number of children: Not on file  ? Years of education: Not on file  ? Highest education level: Not on file  ?Occupational History  ? Not on file  ?Tobacco Use  ? Smoking status: Every Day  ?  Packs/day: 0.50  ?  Types: Cigarettes  ? Smokeless tobacco: Never  ?Vaping Use  ? Vaping Use: Not on file  ?Substance and Sexual Activity  ? Alcohol use: Never  ? Drug use:  Yes  ?  Types: Marijuana, Heroin, Cocaine  ?  Comment: denies drug use  ? Sexual activity: Not Currently  ?Other Topics Concern  ? Not on file  ?Social History Narrative  ? ** Merged History Encounter **  ?    ? ?Social Determinants of Health  ? ?Financial Resource Strain: Not on file  ?Food Insecurity: Not on file  ?Transportation Needs: Not on file  ?Physical Activity: Not on file  ?Stress: Not on file  ?Social Connections: Not on file  ? ?Additional Social History: ?  ? ?Allergies:   ?Allergies  ?Allergen Reactions  ? Tomato Itching and Other (See Comments)  ?  Throat and tongue itches  ? ? ?Labs:  ?Results for orders placed or performed during the hospital  encounter of 01/14/22 (from the past 48 hour(s))  ?CBC with Differential     Status: Abnormal  ? Collection Time: 01/15/22  4:05 AM  ?Result Value Ref Range  ? WBC 4.9 4.0 - 10.5 K/uL  ? RBC 4.24 4.22 - 5.81 MIL/uL  ? Hemoglobin 11.0 (L) 13.0 - 17.0 g/dL  ? HCT 32.1 (L) 39.0 - 52.0 %  ? MCV 75.7 (L) 80.0 - 100.0 fL  ? MCH 25.9 (L) 26.0 - 34.0 pg  ? MCHC 34.3 30.0 - 36.0 g/dL  ? RDW 14.8 11.5 - 15.5 %  ? Platelets 323 150 - 400 K/uL  ? nRBC 0.0 0.0 - 0.2 %  ? Neutrophils Relative % 53 %  ? Neutro Abs 2.6 1.7 - 7.7 K/uL  ? Lymphocytes Relative 32 %  ? Lymphs Abs 1.6 0.7 - 4.0 K/uL  ? Monocytes Relative 11 %  ? Monocytes Absolute 0.5 0.1 - 1.0 K/uL  ? Eosinophils Relative 3 %  ? Eosinophils Absolute 0.1 0.0 - 0.5 K/uL  ? Basophils Relative 1 %  ? Basophils Absolute 0.0 0.0 - 0.1 K/uL  ? Immature Granulocytes 0 %  ? Abs Immature Granulocytes 0.01 0.00 - 0.07 K/uL  ?  Comment: Performed at [facility], [address], [place], [place] [address]  ?Ethanol     Status: None  ? Collection Time: 01/15/22  4:05 AM  ?Result Value Ref Range  ? Alcohol, Ethyl (B) <10 <10 mg/dL  ?  Comment: (NOTE) ?Lowest detectable limit for serum alcohol is 10 mg/dL. ? ?For medical purposes only. ?Performed at [facility], [address] ?[place], [place] [address] ?  ?Basic metabolic panel     Status: None  ? Collection Time: 01/15/22  4:05 AM  ?Result Value Ref Range  ? Sodium 139 135 - 145 mmol/L  ? Potassium 3.8 3.5 - 5.1 mmol/L  ? Chloride 107 98 - 111 mmol/L  ? CO2 27 22 - 32 mmol/L  ? Glucose, Bld 97 70 - 99 mg/dL  ?  Comment: Glucose reference range applies only to samples taken after fasting for at least 8 hours.  ? BUN 16 6 - 20 mg/dL  ? Creatinine, Ser 1.09 0.61 - 1.24 mg/dL  ? Calcium 9.0 8.9 - 10.3 mg/dL  ? GFR, Estimated >60 >60 mL/min  ?  Comment: (NOTE) ?Calculated using the CKD-EPI Creatinine Equation (2021) ?  ? Anion gap 5 5 - 15  ?  Comment: Performed at [facility],  [address] [place], [place] [address]  ?Urinalysis, Routine w reflex microscopic     Status: Abnormal  ? Collection Time: 01/15/22  5:47 AM  ?Result Value Ref Range  ?  Color, Urine YELLOW YELLOW  ? APPearance CLOUDY (A) CLEAR  ? Specific Gravity, Urine 1.020 1.005 - 1.030  ? pH 6.0 5.0 - 8.0  ? Glucose, UA NEGATIVE NEGATIVE mg/dL  ? Hgb urine dipstick NEGATIVE NEGATIVE  ? Bilirubin Urine NEGATIVE NEGATIVE  ? Ketones, ur NEGATIVE NEGATIVE mg/dL  ? Protein, ur NEGATIVE NEGATIVE mg/dL  ? Nitrite NEGATIVE NEGATIVE  ? Leukocytes,Ua NEGATIVE NEGATIVE  ?  Comment: Performed at [facility], [address] [place], [place] [address]  ?Urine rapid drug screen (hosp performed)     Status: Abnormal  ? Collection Time: 01/15/22  5:47 AM  ?Result Value Ref Range  ? Opiates NONE DETECTED NONE DETECTED  ? Cocaine POSITIVE (A) NONE DETECTED  ? Benzodiazepines POSITIVE (A) NONE DETECTED  ? Amphetamines POSITIVE (A) NONE DETECTED  ? Tetrahydrocannabinol POSITIVE (A) NONE DETECTED  ? Barbiturates NONE DETECTED NONE DETECTED  ?  Comment: (NOTE) ?DRUG SCREEN FOR MEDICAL PURPOSES ?ONLY.  IF CONFIRMATION IS NEEDED ?FOR ANY PURPOSE, NOTIFY LAB ?WITHIN 5 DAYS. ? ?LOWEST DETECTABLE LIMITS ?FOR URINE DRUG SCREEN ?Drug Class                     Cutoff (ng/mL) ?Amphetamine and metabolites    1000 ?Barbiturate and metabolites    200 ?Benzodiazepine                 200 ?Tricyclics and metabolites     300 ?Opiates and metabolites        300 ?Cocaine and metabolites        300 ?THC                            50 ?Performed at [facility], [address] ?[place], [place] [address] ?  ?Resp Panel by RT-PCR (Flu A&B, Covid) Nasopharyngeal Swab     Status: None  ? Collection Time: 01/15/22  7:53 AM  ? Specimen: Nasopharyngeal Swab; Nasopharyngeal(NP) swabs in vial transport medium  ?Result Value Ref Range  ? SARS Coronavirus 2 by RT PCR NEGATIVE NEGATIVE  ?  Comment: (NOTE) ?SARS-CoV-2 target nucleic acids  are NOT DETECTED. ? ?The SARS-CoV-2 RNA is generally detectable in upper respiratory ?specimens during the acute phase of infection. The lowest ?concentration of SARS-CoV-2 viral copies this assay can detect is

## 2022-01-15 NOTE — ED Notes (Signed)
Pt reports hearing voices, Pt stated, "Im hearing things, and they can hear me, but I don't know what they are saying". ?

## 2022-01-15 NOTE — ED Triage Notes (Signed)
Patient arrived via [facility] after running around in the street after taking unknown drugs. Agitated with EMS, given 5 of versed. Reports auditory hallucinations.  ?

## 2022-01-15 NOTE — ED Provider Notes (Signed)
?[facility]-EMERGENCY DEPT ?Provider Note ? ? ?CSN: [id number] ?Arrival date & time: 01/14/22  2356 ? ?  ? ?History ? ?Chief Complaint  ?Patient presents with  ? Drug Problem  ? ? ?Tristan [name] is a 36 y.o. male. ? ?HPI ? ?Patient with medical history including polysubstance dependency, hypertension presents with altered mental status.  He came under police custody as he was found running around the streets. ? ?Patient is a poor historian patient would not communicate with me. ? ?Patient has been seen in the past for polysubstance abuse most recently been seen in the 17th was seen at the behavioral health hospital for polysubstance dependency was started back on medication and was given referral to outpatient follow-up. ? ?Home Medications ?Prior to Admission medications   ?Medication Sig Start Date End Date Taking? Authorizing Provider  ?FLUoxetine (PROZAC) 20 MG capsule Take 1 capsule (20 mg total) by mouth daily. 01/09/22   Estella Husk, MD  ?hydrOXYzine (ATARAX) 25 MG tablet Take 1 tablet (25 mg total) by mouth every 6 (six) hours as needed for anxiety. 01/08/22   Estella Husk, MD  ?   ? ?Allergies    ?Tomato   ? ?Review of Systems   ?Review of Systems  ?Unable to perform ROS: Mental status change  ? ?Physical Exam ?Updated Vital Signs ?BP 106/61   Pulse 79   Temp 98.3 ?F (36.8 ?C) (Oral)   Resp 15   SpO2 97%  ?Physical Exam ?Vitals and nursing note reviewed.  ?Constitutional:   ?   General: He is not in acute distress. ?   Appearance: He is not ill-appearing.  ?   Comments: Patient was found laying on the bed, controlling his own airway, slightly somnolent but easily arousable, showed no acute signs distress  ?HENT:  ?   Head: Normocephalic and atraumatic.  ?   Ears:  ?   Comments: No deformity the head present no raccoon eyes about sign noted ?   Nose: No congestion.  ?Eyes:  ?   Conjunctiva/sclera: Conjunctivae normal.  ?Cardiovascular:  ?   Rate and Rhythm:  Normal rate and regular rhythm.  ?   Pulses: Normal pulses.  ?   Heart sounds: No murmur heard. ?  No friction rub. No gallop.  ?Pulmonary:  ?   Effort: No respiratory distress.  ?   Breath sounds: No wheezing, rhonchi or rales.  ?Abdominal:  ?   Palpations: Abdomen is soft.  ?   Tenderness: There is no abdominal tenderness. There is no right CVA tenderness or left CVA tenderness.  ?Skin: ?   General: Skin is warm and dry.  ?Neurological:  ?   Mental Status: He is alert.  ?   Comments: No facial asymmetry following two-step commands no unilateral weakness present.  ?Psychiatric:     ?   Mood and Affect: Mood normal.  ? ? ?ED Results / Procedures / Treatments   ?Labs ?(all labs ordered are listed, but only abnormal results are displayed) ?Labs Reviewed  ?CBC WITH DIFFERENTIAL/PLATELET - Abnormal; Notable for the following components:  ?    Result Value  ? Hemoglobin 11.0 (*)   ? HCT 32.1 (*)   ? MCV 75.7 (*)   ? MCH 25.9 (*)   ? All other components within normal limits  ?URINALYSIS, ROUTINE W REFLEX MICROSCOPIC - Abnormal; Notable for the following components:  ? APPearance CLOUDY (*)   ? All other components within normal limits  ?RAPID  URINE DRUG SCREEN, HOSP PERFORMED - Abnormal; Notable for the following components:  ? Cocaine POSITIVE (*)   ? Benzodiazepines POSITIVE (*)   ? Amphetamines POSITIVE (*)   ? Tetrahydrocannabinol POSITIVE (*)   ? All other components within normal limits  ?RESP PANEL BY RT-PCR (FLU A&B, COVID) ARPGX2  ?ETHANOL  ?BASIC METABOLIC PANEL  ? ? ?EKG ?None ? ?Radiology ?No results found. ? ?Procedures ?Procedures  ? ? ?Medications Ordered in ED ?Medications - No data to display ? ?ED Course/ Medical Decision Making/ A&P ?  ?                        ?Medical Decision Making ?Amount and/or Complexity of Data Reviewed ?Labs: ordered. ? ? ?This patient presents to the ED for concern of altered mental status, this involves an extensive number of treatment options, and is a complaint that carries  with it a high risk of complications and morbidity.  The differential diagnosis includes metabolic derailment, CVA, psychiatric emergency ? ? ? ?Additional history obtained: ? ?Additional history obtained from N/A ?External records from outside source obtained and reviewed including IVC paperwork, previous psych notes ? ? ?Co morbidities that complicate the patient evaluation ? ?Polysubstance dependency ? ?Social Determinants of Health: ? ?N/A ? ? ? ?Lab Tests: ? ?I Ordered, and personally interpreted labs.  The pertinent results include:  CBC shows microcytic anemia hemoglobin 11, BMP unremarkable, ethanol less than 10 UA unremarkable, cocaine positive benzo positive amphetamine positive for cannabinoids positive ? ? ?Imaging Studies ordered: ? ?I ordered imaging studies including N/A ?I independently visualized and interpreted imaging which showed N/A ?I agree with the radiologist interpretation ? ? ?Cardiac Monitoring: ? ?The patient was maintained on a cardiac monitor.  I personally viewed and interpreted the cardiac monitored which showed an underlying rhythm of: Pending ? ? ?Medicines ordered and prescription drug management: ? ?I ordered medication including N/A ?I have reviewed the patients home medicines and have made adjustments as needed ? ?Critical Interventions: ? ?N/A ? ? ?Reevaluation: ? ?Presents via police due to altered mental status, he was slightly slow on my exam but arousable, presentation consistent with polysubstance abuse.  Will obtain basic lab work-up and continue monitoring ? ?Lab work was unremarkable rapid urine drug she shows cocaine benzos amphetamine cannabinoids likely the cause of his mental status change continue to monitor ? ?Patient was reassessed she is tolerating p.o. he is at his baseline, he is endorsing suicidal ideation and states he wants to jump in front of a truck or overdose.  He states he wants to stay for psychiatric help will place him under psych hold await TTS  recommendations ? ?Consultations Obtained: ? ?TTS pending at this time ? ? ? ?Test Considered: ? ?CT head will defer as my suspicion for intracranial head bleed very low at this time no signs of head trauma, no focal deficits, not anticoag's. ? ? ? ?Dispostion and problem list ? ?After consideration of the diagnostic results and the patients response to treatment, I feel that the patent would benefit from psychiatric hold. ? ?Patient is endorsing suicidal ideations, will place under psych hold, order home medications, and await TTS recommendation he is here voluntarily not under IVC at this time. ? ? ? ? ? ? ? ? ? ? ? ?Final Clinical Impression(s) / ED Diagnoses ?Final diagnoses:  ?Polysubstance abuse (HCC)  ?Suicidal ideation  ? ? ?Rx / DC Orders ?ED Discharge Orders   ? ?  None  ? ?  ? ? ?  ?Carroll SageFaulkner, Eren Puebla J, PA-C ?01/15/22 [phone] ? ?  ?Tilden Fossaees, Elizabeth, MD ?01/15/22 [phone] ? ?

## 2022-01-15 NOTE — Progress Notes (Signed)
CSW requested that [facility] Fransico Michael, RN review pt. ? ? ?Maryjean Ka, MSW, LCSWA ?01/15/2022 11:47 PM ? ? ?

## 2022-01-16 ENCOUNTER — Other Ambulatory Visit: Payer: Self-pay

## 2022-01-16 ENCOUNTER — Inpatient Hospital Stay (HOSPITAL_COMMUNITY)
Admission: AD | Admit: 2022-01-16 | Discharge: 2022-01-22 | DRG: 885 | Disposition: A | Discharge status: Home / Self Care | Payer: Federal, State, Local not specified - Other | Source: Intra-hospital | Attending: Psychiatry | Admitting: Psychiatry

## 2022-01-16 ENCOUNTER — Encounter (HOSPITAL_COMMUNITY): Payer: Self-pay

## 2022-01-16 ENCOUNTER — Encounter (HOSPITAL_COMMUNITY): Payer: Self-pay | Admitting: Nurse Practitioner

## 2022-01-16 DIAGNOSIS — Z79899 Other long term (current) drug therapy: Secondary | ICD-10-CM

## 2022-01-16 DIAGNOSIS — K760 Fatty (change of) liver, not elsewhere classified: Secondary | ICD-10-CM | POA: Diagnosis present

## 2022-01-16 DIAGNOSIS — F1910 Other psychoactive substance abuse, uncomplicated: Secondary | ICD-10-CM | POA: Diagnosis present

## 2022-01-16 DIAGNOSIS — Z20822 Contact with and (suspected) exposure to covid-19: Secondary | ICD-10-CM | POA: Diagnosis present

## 2022-01-16 DIAGNOSIS — R45851 Suicidal ideations: Secondary | ICD-10-CM | POA: Diagnosis present

## 2022-01-16 DIAGNOSIS — Z9151 Personal history of suicidal behavior: Secondary | ICD-10-CM | POA: Diagnosis not present

## 2022-01-16 DIAGNOSIS — K30 Functional dyspepsia: Secondary | ICD-10-CM | POA: Diagnosis present

## 2022-01-16 DIAGNOSIS — F29 Unspecified psychosis not due to a substance or known physiological condition: Secondary | ICD-10-CM | POA: Diagnosis present

## 2022-01-16 DIAGNOSIS — F333 Major depressive disorder, recurrent, severe with psychotic symptoms: Secondary | ICD-10-CM

## 2022-01-16 DIAGNOSIS — F10139 Alcohol abuse with withdrawal, unspecified: Secondary | ICD-10-CM | POA: Diagnosis present

## 2022-01-16 DIAGNOSIS — K5900 Constipation, unspecified: Secondary | ICD-10-CM | POA: Diagnosis present

## 2022-01-16 DIAGNOSIS — F1111 Opioid abuse, in remission: Secondary | ICD-10-CM | POA: Diagnosis present

## 2022-01-16 DIAGNOSIS — B1710 Acute hepatitis C without hepatic coma: Secondary | ICD-10-CM | POA: Diagnosis present

## 2022-01-16 DIAGNOSIS — F1490 Cocaine use, unspecified, uncomplicated: Secondary | ICD-10-CM | POA: Diagnosis present

## 2022-01-16 DIAGNOSIS — K219 Gastro-esophageal reflux disease without esophagitis: Secondary | ICD-10-CM | POA: Diagnosis present

## 2022-01-16 DIAGNOSIS — F332 Major depressive disorder, recurrent severe without psychotic features: Secondary | ICD-10-CM | POA: Diagnosis present

## 2022-01-16 DIAGNOSIS — F17200 Nicotine dependence, unspecified, uncomplicated: Secondary | ICD-10-CM | POA: Diagnosis present

## 2022-01-16 DIAGNOSIS — B1920 Unspecified viral hepatitis C without hepatic coma: Secondary | ICD-10-CM

## 2022-01-16 DIAGNOSIS — R7401 Elevation of levels of liver transaminase levels: Secondary | ICD-10-CM | POA: Diagnosis present

## 2022-01-16 DIAGNOSIS — G4700 Insomnia, unspecified: Secondary | ICD-10-CM | POA: Diagnosis present

## 2022-01-16 DIAGNOSIS — R4182 Altered mental status, unspecified: Secondary | ICD-10-CM | POA: Diagnosis present

## 2022-01-16 DIAGNOSIS — F315 Bipolar disorder, current episode depressed, severe, with psychotic features: Principal | ICD-10-CM | POA: Diagnosis present

## 2022-01-16 MED ORDER — ONDANSETRON 4 MG PO TBDP
4.0000 mg | ORAL_TABLET | Freq: Four times a day (QID) | ORAL | Status: AC | PRN
  Administered 2022-01-16: 4 mg via ORAL
  Filled 2022-01-16: qty 1

## 2022-01-16 MED ORDER — HYDROXYZINE HCL 25 MG PO TABS: 25.0000 mg | ORAL_TABLET | Freq: Four times a day (QID) | ORAL | Status: DC | PRN

## 2022-01-16 MED ORDER — ADULT MULTIVITAMIN W/MINERALS CH
1.0000 | ORAL_TABLET | Freq: Every day | ORAL | Status: DC
  Administered 2022-01-16 – 2022-01-22 (×7): 1 via ORAL
  Filled 2022-01-16: qty 1
  Filled 2022-01-16: qty 7
  Filled 2022-01-16 (×8): qty 1

## 2022-01-16 MED ORDER — LORAZEPAM 1 MG PO TABS
1.0000 mg | ORAL_TABLET | Freq: Four times a day (QID) | ORAL | Status: AC | PRN
  Administered 2022-01-16 – 2022-01-18 (×2): 1 mg via ORAL
  Filled 2022-01-16 (×2): qty 1

## 2022-01-16 MED ORDER — PANTOPRAZOLE SODIUM 40 MG PO TBEC
40.0000 mg | DELAYED_RELEASE_TABLET | Freq: Every day | ORAL | Status: DC
Start: 2022-01-16 — End: 2022-01-22
  Administered 2022-01-16 – 2022-01-22 (×7): 40 mg via ORAL
  Filled 2022-01-16 (×8): qty 1
  Filled 2022-01-16: qty 7
  Filled 2022-01-16 (×2): qty 1

## 2022-01-16 MED ORDER — NICOTINE POLACRILEX 2 MG MT GUM
2.0000 mg | CHEWING_GUM | OROMUCOSAL | Status: DC | PRN
  Administered 2022-01-16 – 2022-01-21 (×3): 2 mg via ORAL
  Filled 2022-01-16 (×2): qty 1

## 2022-01-16 MED ORDER — THIAMINE HCL 100 MG/ML IJ SOLN
100.0000 mg | Freq: Once | INTRAMUSCULAR | Status: AC
  Administered 2022-01-16: 100 mg via INTRAMUSCULAR
  Filled 2022-01-16: qty 2

## 2022-01-16 MED ORDER — QUETIAPINE FUMARATE 50 MG PO TABS
50.0000 mg | ORAL_TABLET | Freq: Every day | ORAL | Status: DC
  Administered 2022-01-17: 50 mg via ORAL
  Filled 2022-01-16 (×3): qty 1

## 2022-01-16 MED ORDER — ALUM & MAG HYDROXIDE-SIMETH 200-200-20 MG/5ML PO SUSP: 30.0000 mL | ORAL | Status: DC | PRN

## 2022-01-16 MED ORDER — QUETIAPINE FUMARATE 25 MG PO TABS
25.0000 mg | ORAL_TABLET | Freq: Every day | ORAL | Status: DC
  Filled 2022-01-16 (×2): qty 1

## 2022-01-16 MED ORDER — THIAMINE HCL 100 MG PO TABS
100.0000 mg | ORAL_TABLET | Freq: Every day | ORAL | Status: DC
  Administered 2022-01-17 – 2022-01-22 (×6): 100 mg via ORAL
  Filled 2022-01-16 (×2): qty 1
  Filled 2022-01-16: qty 7
  Filled 2022-01-16 (×6): qty 1

## 2022-01-16 MED ORDER — ACETAMINOPHEN 325 MG PO TABS
650.0000 mg | ORAL_TABLET | Freq: Four times a day (QID) | ORAL | Status: DC | PRN
  Administered 2022-01-16: 650 mg via ORAL
  Filled 2022-01-16: qty 2

## 2022-01-16 MED ORDER — IBUPROFEN 400 MG PO TABS
400.0000 mg | ORAL_TABLET | Freq: Four times a day (QID) | ORAL | Status: DC | PRN
  Administered 2022-01-16 – 2022-01-20 (×2): 400 mg via ORAL
  Filled 2022-01-16 (×2): qty 1

## 2022-01-16 MED ORDER — MAGNESIUM HYDROXIDE 400 MG/5ML PO SUSP
30.0000 mL | Freq: Every day | ORAL | Status: DC | PRN
  Administered 2022-01-16: 30 mL via ORAL
  Filled 2022-01-16: qty 30

## 2022-01-16 MED ORDER — HYDROXYZINE HCL 25 MG PO TABS
25.0000 mg | ORAL_TABLET | Freq: Three times a day (TID) | ORAL | Status: DC | PRN
  Administered 2022-01-16 – 2022-01-21 (×10): 25 mg via ORAL
  Filled 2022-01-16: qty 1
  Filled 2022-01-16: qty 10
  Filled 2022-01-16 (×9): qty 1

## 2022-01-16 MED ORDER — ENSURE ENLIVE PO LIQD
237.0000 mL | Freq: Two times a day (BID) | ORAL | Status: AC
  Administered 2022-01-16 – 2022-01-17 (×2): 237 mL via ORAL
  Filled 2022-01-16: qty 237

## 2022-01-16 MED ORDER — ESCITALOPRAM OXALATE 10 MG PO TABS
10.0000 mg | ORAL_TABLET | Freq: Every day | ORAL | Status: DC
  Administered 2022-01-17 – 2022-01-22 (×6): 10 mg via ORAL
  Filled 2022-01-16 (×3): qty 1
  Filled 2022-01-16: qty 7
  Filled 2022-01-16 (×5): qty 1

## 2022-01-16 MED ORDER — LOPERAMIDE HCL 2 MG PO CAPS
2.0000 mg | ORAL_CAPSULE | ORAL | Status: AC | PRN
  Filled 2022-01-16: qty 2

## 2022-01-16 NOTE — Progress Notes (Signed)
D:  Patient denied SI and HI, contracts for safety.  Denied A/V hallucinations.   A:  Medications administered per MD orders.  Emotional support and encouragement given patient. R:  Safety maintained with 15 minute checks.  

## 2022-01-16 NOTE — ED Notes (Signed)
[facility] called. ETA 10  minutes ? ?

## 2022-01-16 NOTE — Group Note (Signed)
Recreation Therapy Group Note ? ? ?Group Topic:Stress Management  ?Group Date: 01/16/2022 ?Start Time: 59 ?End Time: 0955 ?Facilitators: Caroll Rancher, LRT,CTRS ?Location: [address] Day[address] ? ? ?Goal Area(s) Addresses:  ?Patient will identify positive stress management techniques. ?Patient will identify benefits of using stress management post d/c. ? ?Group Description:  Meditation.  LRT played a meditation that focused on setting intention for the day.  It also focused on making the best use for your time and not focusing on things from the previous day but to focus on what you can in the now.  Patients were to listen and follow along as meditation played to fully engage.  Patients also learned they could find other stress management techniques from Youtube, apps and the Internet in general. ? ? ?Affect/Mood: N/A ?  ?Participation Level: Did not attend ?  ? ?Clinical Observations/Individualized Feedback:   ? ? ?Plan: Continue to engage patient in RT group sessions 2-3x/week. ? ? ?Caroll Rancher, LRT,CTRS ?01/16/2022 12:00 PM ?

## 2022-01-16 NOTE — ED Notes (Signed)
Patient was accepted to [facility]. Voluntary Consent faxed to [facility].  ?

## 2022-01-16 NOTE — Tx Team (Signed)
Initial Treatment Plan ?01/16/2022 ?5:01 AM ?[name] ?[id number] ? ? ? ?PATIENT STRESSORS: ?Financial difficulties   ?Medication change or noncompliance   ?Substance abuse   ?Traumatic event   ? ? ?PATIENT STRENGTHS: ?General fund of knowledge  ?Physical Health  ?Supportive family/friends  ? ? ?PATIENT IDENTIFIED PROBLEMS: ?Depression  ?Suicidal ideation  ?Substance abuse  ?  ?"Ways to block these down thoughts"  ?"Let of of things I can't change"  ?  ?  ?  ?  ? ?DISCHARGE CRITERIA:  ?Improved stabilization in mood, thinking, and/or behavior ?Need for constant or close observation no longer present ?Reduction of life-threatening or endangering symptoms to within safe limits ?Verbal commitment to aftercare and medication compliance ? ?PRELIMINARY DISCHARGE PLAN: ?Outpatient therapy ?Substance abuse treatment ? ?PATIENT/FAMILY INVOLVEMENT: ?This treatment plan has been presented to and reviewed with the patient, Tristan [name].  The patient and family have been given the opportunity to ask questions and make suggestions. ? ?Levin Bacon, RN ?01/16/2022, 5:01 AM ?

## 2022-01-16 NOTE — [facility] Group Notes (Signed)
Patient did not attend the NA group. 

## 2022-01-16 NOTE — Group Note (Signed)
[place] LCSW Group Therapy Note ? ?Date/Time: 02/23/15 at 3:00pm ? ?Type of Therapy and Topic:  Group Therapy:  Strengths and Qualities ? ?Participation Level:  Did not attend ? ?Description of Group:   ? In this group patients will be asked to explore and define the terms strength ans qualities.  Patients will be guided to discuss their thoughts, feelings, and behaviors as to where strengths and qualities originate. Participants will then list some of their strengths and qualities related to each subject topic. This group will be process-oriented, with patients participating in exploration of their own experiences as well as giving and receiving support and challenge from other group members. ? ?Therapeutic Goals: ?Patient will identify specific strengths related to their personal life. ?Patient will identify feelings, thoughts, and beliefs about strengths and qualities. ?Patient will identify ways their strengths have been used. Marland Kitchen ?Patient will identify situations where they have helped others or made someone else happy. . ? ?Summary of Patient Progress ?Did not attend ? ? ? ? ?Therapeutic Modalities:   ?Cognitive Behavioral Therapy ?Solution Focused Therapy ?Motivational Interviewing ?Brief Therapy ? ? ? ?Tristan Malanga, LCSW, LCAS ?Clincal Social Worker  ?[facility] ? ? ?

## 2022-01-16 NOTE — Progress Notes (Signed)
Tristan [name] is a 36 year old male patient being admitted voluntarily to [facility] from [facility].  He arrived via EMS with altered mental status.  He was apparently seen running around the streets.  He has history of Bipolar disorder, PTSD and Polysubstance abuse.  His UDS was positive for benzos, marijuana, cocaine and amphetamines.  He voiced suicidal ideation to OD, jump off bridge or death by cops.  He was recently seen at [facility] and was referred to [facility] but they would not accept him because he did not bring enough daily medications.  During [facility] admission, he was calm but sad.  He denied current SI but does state that it comes and goes.  He verbally agrees to not harm himself on the unit.  He denied HI or VH.  He does report intermittent auditory hallucinations that talk down to him but they are not constant.  He denied any pain or discomfort and appeared to be in no physical distress.  Oriented him to the unit.  Admission paperwork completed and signed.  Belongings searched and secured in locker # 07.  Skin search completed and noted multiple abrasions on back, chest and arms.  No redness, swelling or drainage noted.  Suicide safety plan reviewed, given to patient to complete and return to his nurse.  Q 15 minute checks initiated for safety.  We will monitor the progress towards his goals. ?
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
## 2022-01-16 NOTE — BH IP Treatment Plan (Signed)
Interdisciplinary Treatment and Diagnostic Plan Update ? ?01/16/2022 ?Time of Session: 9:15am  ?Tristan [name] ?MRN: [id number] ? ?Principal Diagnosis: MDD (major depressive disorder), recurrent episode, severe ([place]) ? ?Secondary Diagnoses: Principal Problem: ?  MDD (major depressive disorder), recurrent episode, severe ([place]) ? ? ?Current Medications:  ?Current Facility-Administered Medications  ?Medication Dose Route Frequency Provider Last Rate Last Admin  ? acetaminophen (TYLENOL) tablet 650 mg  650 mg Oral Q6H PRN Lindon Romp A, NP   650 mg at 01/16/22 1000  ? alum & mag hydroxide-simeth (MAALOX/MYLANTA) 200-200-20 MG/5ML suspension 30 mL  30 mL Oral Q4H PRN Rozetta Nunnery, NP      ? Derrill Memo ON 01/17/2022] escitalopram (LEXAPRO) tablet 10 mg  10 mg Oral Daily Doda, Vandana, MD      ? hydrOXYzine (ATARAX) tablet 25 mg  25 mg Oral TID PRN Lindon Romp A, NP   25 mg at 01/16/22 1001  ? loperamide (IMODIUM) capsule 2-4 mg  2-4 mg Oral PRN Armando Reichert, MD      ? LORazepam (ATIVAN) tablet 1 mg  1 mg Oral Q6H PRN Armando Reichert, MD      ? magnesium hydroxide (MILK OF MAGNESIA) suspension 30 mL  30 mL Oral Daily PRN Rozetta Nunnery, NP      ? multivitamin with minerals tablet 1 tablet  1 tablet Oral Daily Doda, Vandana, MD      ? nicotine polacrilex (NICORETTE) gum 2 mg  2 mg Oral PRN Lindon Romp A, NP      ? ondansetron (ZOFRAN-ODT) disintegrating tablet 4 mg  4 mg Oral Q6H PRN Armando Reichert, MD      ? QUEtiapine (SEROQUEL) tablet 25 mg  25 mg Oral QHS Doda, Edd Arbour, MD      ? thiamine (B-1) injection 100 mg  100 mg Intramuscular Once Armando Reichert, MD      ? Derrill Memo ON 01/17/2022] thiamine tablet 100 mg  100 mg Oral Daily Armando Reichert, MD      ? ?PTA Medications: ?Medications Prior to Admission  ?Medication Sig Dispense Refill Last Dose  ? FLUoxetine (PROZAC) 20 MG capsule Take 1 capsule (20 mg total) by mouth daily. 30 capsule 1   ? hydrOXYzine (ATARAX) 25 MG tablet Take 1 tablet (25 mg total) by mouth every  6 (six) hours as needed for anxiety. 30 tablet 1   ? ? ?Patient Stressors: Financial difficulties   ?Medication change or noncompliance   ?Substance abuse   ?Traumatic event   ? ?Patient Strengths: General fund of knowledge  ?Physical Health  ?Supportive family/friends  ? ?Treatment Modalities: Medication Management, Group therapy, Case management,  ?1 to 1 session with clinician, Psychoeducation, Recreational therapy. ? ? ?Physician Treatment Plan for Primary Diagnosis: MDD (major depressive disorder), recurrent episode, severe ([place]) ?Long Term Goal(s):    ? ?Short Term Goals:   ? ?Medication Management: Evaluate patient's response, side effects, and tolerance of medication regimen. ? ?Therapeutic Interventions: 1 to 1 sessions, Unit Group sessions and Medication administration. ? ?Evaluation of Outcomes: Not Met ? ?Physician Treatment Plan for Secondary Diagnosis: Principal Problem: ?  MDD (major depressive disorder), recurrent episode, severe ([place]) ? ?Long Term Goal(s):    ? ?Short Term Goals:      ? ?Medication Management: Evaluate patient's response, side effects, and tolerance of medication regimen. ? ?Therapeutic Interventions: 1 to 1 sessions, Unit Group sessions and Medication administration. ? ?Evaluation of Outcomes: Not Met ? ? ?RN Treatment Plan for Primary Diagnosis: MDD (major depressive disorder),  recurrent episode, severe ([place]) ?Long Term Goal(s): Knowledge of disease and therapeutic regimen to maintain health will improve ? ?Short Term Goals: Ability to remain free from injury will improve, Ability to participate in decision making will improve, Ability to verbalize feelings will improve, Ability to disclose and discuss suicidal ideas, and Ability to identify and develop effective coping behaviors will improve ? ?Medication Management: RN will administer medications as ordered by provider, will assess and evaluate patient's response and provide education to patient for prescribed medication. RN  will report any adverse and/or side effects to prescribing provider. ? ?Therapeutic Interventions: 1 on 1 counseling sessions, Psychoeducation, Medication administration, Evaluate responses to treatment, Monitor vital signs and CBGs as ordered, Perform/monitor CIWA, COWS, AIMS and Fall Risk screenings as ordered, Perform wound care treatments as ordered. ? ?Evaluation of Outcomes: Not Met ? ? ?LCSW Treatment Plan for Primary Diagnosis: MDD (major depressive disorder), recurrent episode, severe ([place]) ?Long Term Goal(s): Safe transition to appropriate next level of care at discharge, Engage patient in therapeutic group addressing interpersonal concerns. ? ?Short Term Goals: Engage patient in aftercare planning with referrals and resources, Increase social support, Increase emotional regulation, Facilitate acceptance of mental health diagnosis and concerns, Identify triggers associated with mental health/substance abuse issues, and Increase skills for wellness and recovery ? ?Therapeutic Interventions: Assess for all discharge needs, 1 to 1 time with Education officer, museum, Explore available resources and support systems, Assess for adequacy in community support network, Educate family and significant other(s) on suicide prevention, Complete Psychosocial Assessment, Interpersonal group therapy. ? ?Evaluation of Outcomes: Not Met ? ? ?Progress in Treatment: ?Attending groups: No. ?Participating in groups: No. ?Taking medication as prescribed: Yes. ?Toleration medication: Yes. ?Family/Significant other contact made: Yes, individual(s) contacted:  Mother and Father  ?Patient understands diagnosis: Yes. ?Discussing patient identified problems/goals with staff: Yes. ?Medical problems stabilized or resolved: Yes. ?Denies suicidal/homicidal ideation: Yes. ?Issues/concerns per patient self-inventory: No. ? ? ?New problem(s) identified: No, Describe:  None  ? ?New Short Term/Long Term Goal(s): medication stabilization, elimination of SI  thoughts, development of comprehensive mental wellness plan.  ? ?Patient Goals: "To learn coping skills and to better manage my depression"  ? ?Discharge Plan or Barriers: Patient recently admitted. CSW will continue to follow and assess for appropriate referrals and possible discharge planning.  ? ?Reason for Continuation of Hospitalization: Depression ?Medication stabilization ?Suicidal ideation ?Withdrawal symptoms ? ?Estimated Length of Stay: 3 to 5 days  ? ?Last 3 [place] Suicide Severity Risk Score: ?[place] Admission (Current) from 01/16/2022 in [facility] ED from 01/14/2022 in [place] DEPT ED from 01/07/2022 in [facility]  ?C-SSRS RISK CATEGORY Moderate Risk Moderate Risk Moderate Risk  ? ?  ? ? ?Last PHQ 2/9 Scores: ? ?  01/09/2022  ?  8:53 AM 01/08/2022  ? 11:57 AM 11/21/2021  ?  4:22 PM  ?Depression screen PHQ 2/9  ?Decreased Interest _0 ?Down, Depressed, Hopeless _1 ?PHQ - 2 Score _2 ?Altered sleeping _3 ?Tired, decreased energy 1 1 0  ?Change in appetite _4 ?Feeling bad or failure about yourself  _5 ?Trouble concentrating _6 ?Moving slowly or fidgety/restless _7 ?Suicidal thoughts _8 ?PHQ-9 Score _9 ?Difficult doing work/chores  Very difficult Very difficult  ? ? ?Scribe for Treatment Team: ?Darleen Crocker, LCSWA ?  01/16/2022 ?3:19 PM ? ? ?

## 2022-01-16 NOTE — Plan of Care (Signed)
Nurse discussed anxiety, depression and coping skills with patient.  

## 2022-01-16 NOTE — B[facility] Group Notes (Signed)
Adult Psychoeducational Group Note ? ?Date:  01/16/2022 ?Time:  3:01 PM ? ?Group Topic/Focus:  ?Wellness Toolbox:   The focus of this group is to discuss various aspects of wellness, balancing those aspects and exploring ways to increase the ability to experience wellness.  Patients will create a wellness toolbox for use upon discharge. ? ?Participation Level:  Active ? ?Participation Quality:  Attentive ? ?Affect:  Appropriate ? ?Cognitive:  Alert ? ?Insight: Appropriate ? ?Engagement in Group:  Engaged ? ?Modes of Intervention:  Activity ? ?Additional Comments:  Patient attended and participated in the relaxation group activity. ? ?Annie Sable ?01/16/2022, 3:01 PM ?

## 2022-01-16 NOTE — ED Notes (Signed)
Report given to [facility]. Patient is ready for transport.  ?
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
## 2022-01-17 LAB — HEPATIC FUNCTION PANEL
ALT: 798 U/L — ABNORMAL HIGH (ref 0–44)
AST: 368 U/L — ABNORMAL HIGH (ref 15–41)
Albumin: 3.5 g/dL (ref 3.5–5.0)
Alkaline Phosphatase: 101 U/L (ref 38–126)
Bilirubin, Direct: 0.1 mg/dL (ref 0.0–0.2)
Indirect Bilirubin: 0.8 mg/dL (ref 0.3–0.9)
Total Bilirubin: 0.9 mg/dL (ref 0.3–1.2)
Total Protein: 6.6 g/dL (ref 6.5–8.1)

## 2022-01-17 LAB — HIV ANTIBODY (ROUTINE TESTING W REFLEX): HIV Screen 4th Generation wRfx: NONREACTIVE

## 2022-01-17 MED ORDER — QUETIAPINE FUMARATE 100 MG PO TABS
100.0000 mg | ORAL_TABLET | Freq: Every day | ORAL | Status: DC
  Administered 2022-01-17 – 2022-01-21 (×5): 100 mg via ORAL
  Filled 2022-01-17: qty 1
  Filled 2022-01-17: qty 7
  Filled 2022-01-17 (×7): qty 1

## 2022-01-17 NOTE — Progress Notes (Addendum)
[facility] ? ?01/17/2022 4:49 PM ?Tristan [name]  ?MRN:  [id number] ?Subjective:  Patient is a 36 year old male with past psychiatric history of bipolar 1 disorder, polysubstance abuse, substance-induced mood disorder, heroin use disorder-severe dependence,  who [facility] due to altered mental status.  He came under police custody as he was found running around the streets.  He reported that he was hearing voices. ? ?Chart review from last 24 hours-The patient's chart was reviewed and nursing notes were reviewed. Patient discussed in progression rounds with treatment team. MAR was reviewed and Pt is complaint  with scheduled medications and required PRN medications  Vistaril x1, Advil x1, Ativan x1, milk of magnesia x1, Nicorette gum x1, Zofran x1.  Last CIWA 1.  Patient slept 8.75 hours last night. ? ?Pt is seen today in his room. Pt appears sad. Pt states his mood is " not good".  He reports that he heard bad news this morning regarding hepatitis C.  Patient reports feeling depressed and anxious due to new diagnosis.  Discussed that we have sent HCV quantitive RNA to see if he has active infection. Discussed that nowadays, hepatitis C has available treatment and we will refer him to follow-up with GI/infectious disease at discharge.  He verbalizes understanding.  Pt states his appetite is improving.  He slept well last night.  Currently, Pt denies active suicidal ideations but endorses passive suicidal ideations without plan or intent.  He denies homicidal ideation and, visual and auditory hallucination. Pt denies any headache, nausea, vomiting, dizziness, chest pain, SOB, abdominal pain, diarrhea, and constipation.  He denies any withdrawal symptoms but endorses drug and alcohol cravings.  Pt denies any medication side effects and has been tolerating it well.  Discussed increasing dose of Seroquel for mood stabilization.  He verbalizes understanding.  Pt denies any concerns.   ? ?Principal Problem: MDD (major depressive disorder), recurrent episode, severe (HCC) ?Diagnosis: Active Problems: ?  Polysubstance abuse (HCC) ?  Bipolar affective disorder, depressed, severe, with psychotic behavior (HCC) ?  Transaminitis ? ?Total Time spent with patient: 30 minutes ? ?Past Psychiatric History: see H&P ? ?Past Medical History:  ?Past Medical History:  ?Diagnosis Date  ? Bipolar 1 disorder (HCC)   ? Hypertension   ? PTSD (post-traumatic stress disorder)   ?  ?Past Surgical History:  ?Procedure Laterality Date  ? NO PAST SURGERIES    ? ?Family History:  ?Family History  ?Problem Relation Age of Onset  ? Other Father   ? Psychiatric Illness Father   ? ?Family Psychiatric  History: see H&P ?Social History:  ?Social History  ? ?Substance and Sexual Activity  ?Alcohol Use Yes  ? Comment: Pint per day  ?   ?Social History  ? ?Substance and Sexual Activity  ?Drug Use Yes  ? Types: Marijuana, Heroin, Cocaine  ? Comment: denies drug use  ?  ?Social History  ? ?Socioeconomic History  ? Marital status: Single  ?  Spouse name: Not on file  ? Number of children: Not on file  ? Years of education: Not on file  ? Highest education level: Not on file  ?Occupational History  ? Not on file  ?Tobacco Use  ? Smoking status: Every Day  ?  Packs/day: 0.50  ?  Types: Cigarettes  ? Smokeless tobacco: Never  ?Vaping Use  ? Vaping Use: Never used  ?Substance and Sexual Activity  ? Alcohol use: Yes  ?  Comment: Pint per day  ?  Drug use: Yes  ?  Types: Marijuana, Heroin, Cocaine  ?  Comment: denies drug use  ? Sexual activity: Not Currently  ?Other Topics Concern  ? Not on file  ?Social History Narrative  ? ** Merged History Encounter **  ?    ? ?Social Determinants of Health  ? ?Financial Resource Strain: Not on file  ?Food Insecurity: Not on file  ?Transportation Needs: Not on file  ?Physical Activity: Not on file  ?Stress: Not on file  ?Social Connections: Not on file  ? ?Additional Social History:  ?  ?  ?  ?  ?  ?  ?   ?  ?  ?  ?  ? ?Sleep: Good ? ?Appetite: Improving ? ?Current Medications: ?Current Facility-Administered Medications  ?Medication Dose Route Frequency Provider Last Rate Last Admin  ? alum & mag hydroxide-simeth (MAALOX/MYLANTA) 200-200-20 MG/5ML suspension 30 mL  30 mL Oral Q4H PRN Nira Conn A, NP      ? escitalopram (LEXAPRO) tablet 10 mg  10 mg Oral Daily Karsten Ro, MD   10 mg at 01/17/22 1054  ? hydrOXYzine (ATARAX) tablet 25 mg  25 mg Oral TID PRN Nira Conn A, NP   25 mg at 01/17/22 0133  ? ibuprofen (ADVIL) tablet 400 mg  400 mg Oral Q6H PRN Comer Locket, MD   400 mg at 01/16/22 1935  ? loperamide (IMODIUM) capsule 2-4 mg  2-4 mg Oral PRN Karsten Ro, MD      ? LORazepam (ATIVAN) tablet 1 mg  1 mg Oral Q6H PRN Karsten Ro, MD   1 mg at 01/16/22 1934  ? magnesium hydroxide (MILK OF MAGNESIA) suspension 30 mL  30 mL Oral Daily PRN Nira Conn A, NP   30 mL at 01/16/22 1654  ? multivitamin with minerals tablet 1 tablet  1 tablet Oral Daily Karsten Ro, MD   1 tablet at 01/17/22 1054  ? nicotine polacrilex (NICORETTE) gum 2 mg  2 mg Oral PRN Jackelyn Poling, NP   2 mg at 01/16/22 1933  ? ondansetron (ZOFRAN-ODT) disintegrating tablet 4 mg  4 mg Oral Q6H PRN Karsten Ro, MD   4 mg at 01/16/22 1933  ? pantoprazole (PROTONIX) EC tablet 40 mg  40 mg Oral Daily Bartholomew Crews E, MD   40 mg at 01/17/22 1054  ? QUEtiapine (SEROQUEL) tablet 100 mg  100 mg Oral QHS Karsten Ro, MD      ? thiamine tablet 100 mg  100 mg Oral Daily Karsten Ro, MD   100 mg at 01/17/22 1054  ? ? ?Lab Results:  ?Results for orders placed or performed during the hospital encounter of 01/16/22 (from the past 48 hour(s))  ?Hepatic function panel     Status: Abnormal  ? Collection Time: 01/17/22  6:57 AM  ?Result Value Ref Range  ? Total Protein 6.6 6.5 - 8.1 g/dL  ? Albumin 3.5 3.5 - 5.0 g/dL  ? AST 368 (H) 15 - 41 U/L  ? ALT 798 (H) 0 - 44 U/L  ? Alkaline Phosphatase 101 38 - 126 U/L  ? Total Bilirubin 0.9 0.3 - 1.2  mg/dL  ? Bilirubin, Direct 0.1 0.0 - 0.2 mg/dL  ? Indirect Bilirubin 0.8 0.3 - 0.9 mg/dL  ?  Comment: Performed at [facility], [address] [place], [place] [address]  ?HIV Antibody (routine testing w rflx)     Status: None  ? Collection Time: 01/17/22  6:57 AM  ?Result Value  Ref Range  ? HIV Screen 4th Generation wRfx Non Reactive Non Reactive  ?  Comment: Performed at [facility], [address], [place], [place] [address]  ? ? ?Blood Alcohol level:  ?Lab Results  ?Component Value Date  ? ETH <10 01/15/2022  ? ETH <10 01/07/2022  ? ? ?Metabolic Disorder Labs: ?Lab Results  ?Component Value Date  ? HGBA1C 5.1 11/21/2021  ? MPG 99.67 11/21/2021  ? MPG 96.8 02/22/2019  ? ?Lab Results  ?Component Value Date  ? PROLACTIN 4.5 11/21/2021  ? ?Lab Results  ?Component Value Date  ? CHOL 154 11/21/2021  ? TRIG 68 11/21/2021  ? HDL 74 11/21/2021  ? CHOLHDL 2.1 11/21/2021  ? VLDL 14 11/21/2021  ? LDLCALC 66 11/21/2021  ? LDLCALC 66 02/22/2019  ? ? ?Physical Findings: ?AIMS: Facial and Oral Movements ?Muscles of Facial Expression: None, normal ?Lips and Perioral Area: None, normal ?Jaw: None, normal ?Tongue: None, normal,Extremity Movements ?Upper (arms, wrists, hands, fingers): None, normal ?Lower (legs, knees, ankles, toes): None, normal, Trunk Movements ?Neck, shoulders, hips: None, normal, Overall Severity ?Severity of abnormal movements (highest score from questions above): None, normal ?Incapacitation due to abnormal movements: None, normal ?Patient's awareness of abnormal movements (rate only patient's report): No Awareness, Dental Status ?Current problems with teeth and/or dentures?: No ?Does patient usually wear dentures?: No  ?CIWA:  CIWA-Ar Total: 1 ?COWS:    ? ?Musculoskeletal: ?Strength & Muscle Tone: within normal limits ?Gait & Station: normal ?Patient leans: N/A ? ?Psychiatric Specialty Exam: ? ?Presentation  ?General Appearance: Appropriate for Environment; Casual ? ?Eye  Contact:Fair ? ?Speech:Clear and Coherent; Normal Rate ? ?Speech Volume:Decreased ? ?Handedness:Right ? ? ?Mood and Affect  ?Mood:Anxious; Depressed ? ?Affect:Congruent; Depressed ? ? ?Thought Process  ?Thought P

## 2022-01-17 NOTE — Consult Note (Signed)
Initial Consultation Note ? ? ?Patient: Tristan [name] [id number] DOB: [birth year] PCP: Pcp, No ?DOA: 01/16/2022 ?DOS: the patient was seen and examined on 01/17/2022 ?Primary service: Janine Limbo, MD ? ?Referring physician: Dr. Caswell Corwin ?Reason for consult: Elevated LFTs ? ?Assessment and Plan: ?Elevated LFTs ?Hep C ?    - recent diagnosis of hep C in an urgent care roughly one week ago; no follow up on this prior to current admission to [facility] ?    - recommended team speak with GI about further w/u and tx; however, GI referred to hepatology - a service we do not have at [facility] ?    [facility] team has acquired info for outpt hepatology and are calling for assistance ?    - recommend that they check a HCV PCR, APAP level, and RUQ [place] ?    - if HCV viral load is elevated, can speak with ID for Tx options ? ?TR[facility] will sign off at present, please call us again when needed. ? ?HPI: Tristan [name] is a 36 y.o. male with past medical history of bipolar d/o, PTSD. Presenting to [facility] w/ AMS w/ auditory hallucination. W/u is ongoing with their team. [facility] was consulted for elevated LFTs w/ a recent diagnosis of HCV. He was recently seen by Urgent Care on 4/17. During his visit, it was noted that his AST was 325 and his ALT 711. Hepatitis panel showed he was Hep C ab positive. It was recommended that he follow up with ID at discharge. Apparently he has not done this prior to this visit.  ? ?Past Medical History:  ?Diagnosis Date  ? Bipolar 1 disorder ([place])   ? Hypertension   ? PTSD (post-traumatic stress disorder)   ? ?Past Surgical History:  ?Procedure Laterality Date  ? NO PAST SURGERIES    ? ?Social History:  reports that he has been smoking cigarettes. He has been smoking an average of .5 packs per day. He has never used smokeless tobacco. He reports current alcohol use. He reports current drug use. Drugs: Marijuana, Heroin, and Cocaine. ? ?Allergies  ?Allergen Reactions  ? Tomato Itching and Other (See  Comments)  ?  Throat and tongue itches  ? ? ?Family History  ?Problem Relation Age of Onset  ? Other Father   ? Psychiatric Illness Father   ? ? ?Prior to Admission medications   ?Medication Sig Start Date End Date Taking? Authorizing Provider  ?FLUoxetine (PROZAC) 20 MG capsule Take 1 capsule (20 mg total) by mouth daily. 01/09/22   Ival Bible, MD  ?hydrOXYzine (ATARAX) 25 MG tablet Take 1 tablet (25 mg total) by mouth every 6 (six) hours as needed for anxiety. 01/08/22   Ival Bible, MD  ? ? ?Physical Exam: ?Vitals:  ? 01/16/22 0401 01/16/22 1706 01/17/22 0622 01/17/22 [id number]  ?BP: 102/74 109/78 128/79 132/77  ?Pulse: 87 85 79 87  ?Resp: 18  19   ?Temp:  98.8 ?F (37.1 ?C) 97.8 ?F (36.6 ?C)   ?TempSrc:  Oral    ?SpO2: 100%  100% 100%  ?Weight:      ?Height:      ? ?No physical exam at this time. ? ?Data Reviewed:  ? ?AST  368 ?ALT  798 ?WBC  4.9  ? ? ?Family Communication: None ?Primary team communication: Updated primary team by phone. ?Thank you very much for involving [place] in the care of your patient. ? ?Author: ?Jonnie Finner, DO ?01/17/2022 12:12 PM ? ?For on  call review [email or web address].  ?

## 2022-01-17 NOTE — Plan of Care (Signed)

## 2022-01-17 NOTE — Progress Notes (Signed)
?   01/17/22 0800  ?Psych Admission Type (Psych Patients Only)  ?Admission Status Voluntary  ?Psychosocial Assessment  ?Patient Complaints Depression;Anxiety  ?Eye Contact Brief  ?Facial Expression Anxious  ?Affect Depressed;Anxious  ?Speech Logical/coherent  ?Interaction Assertive  ?Motor Activity Fidgety  ?Appearance/Hygiene Unremarkable  ?Behavior Characteristics Unwilling to participate  ?Mood Anxious  ?Aggressive Behavior  ?Targets Self  ?Type of Behavior Verbal  ?Effect No apparent injury  ?Thought Process  ?Coherency Other (Comment) ?(Say little)  ?Content WDL  ?Delusions None reported or observed  ?Perception WDL  ?Hallucination None reported or observed  ?Judgment Poor  ?Confusion WDL  ?Danger to Self  ?Current suicidal ideation? Denies  ?Description of Suicide Plan Denies  ?Agreement Not to Harm Self Yes  ?Description of Agreement verbal  ?Danger to Others  ?Danger to Others None reported or observed  ? ? ?

## 2022-01-17 NOTE — [facility] Suicide Risk Assessment (Signed)
[facility] INPATIENT:  Family/Significant Other Suicide Prevention Education ? ?Suicide Prevention Education:  ?Education Completed;  mother, Alona Bene [phone]), has been identified by the patient as the family member/significant other with whom the patient will be residing, and identified as the person(s) who will aid the patient in the event of a mental health crisis (suicidal ideations/suicide attempt).  With written consent from the patient, the family member/significant other has been provided the following suicide prevention education, prior to the and/or following the discharge of the patient. ? ?CSW spoke with this patient mother who shared that this patient is diagnosed with bipolar disorder and PTSD. She states he has been self-medicating to cope. She states he has been acting strange and paranoid. He makes statements that things are coming through the phones and televisions.  ? ?Pt's mother is unsure if this patient has insurance however, she is going to look through her paperwork and reach back out to CSW with what she is able to find.  ? ? ?The suicide prevention education provided includes the following: ?Suicide risk factors ?Suicide prevention and interventions ?National Suicide Hotline telephone number ?[facility] assessment telephone number ?[facility] Assistance 911 ?[place] and/or Residential Mobile Crisis Unit telephone number ? ?Request made of family/significant other to: ?Remove weapons (e.g., guns, rifles, knives), all items previously/currently identified as safety concern.   ?Remove drugs/medications (over-the-counter, prescriptions, illicit drugs), all items previously/currently identified as a safety concern. ? ?The family member/significant other verbalizes understanding of the suicide prevention education information provided.  The family member/significant other agrees to remove the items of safety concern listed above. ? ?Tristan [name] A Tristan [name] ?01/17/2022,  9:30 AM ?

## 2022-01-17 NOTE — Group Note (Signed)
Occupational Therapy Group Note ? ? ?Group Topic:Goal Setting  ?Group Date: 01/17/2022 ?Start Time: 1400 ?End Time: 1500 ?Facilitators: Brantley Stage, OT  ? ?Group Description: Group encouraged engagement and participation through discussion focused on goal setting. Group members were introduced to goal-setting using the SMART Goal framework, identifying goals as Specific, Measureable, Acheivable, Relevant, and Time-Bound. Group members took time from group to create their own personal goal reflecting the SMART goal template and shared for review by peers and OT.   ? ?Therapeutic Goal(s):  ?Identify at least one goal that fits the SMART framework  ? ? ?Participation Level: Active and Engaged ?  ?Participation Quality: Independent ?  ?Behavior: Alert, Attentive , and Cooperative ?  ?Speech/Thought Process: Directed and Relevant ?  ?Affect/Mood: Appropriate ?  ?Insight: Good ?  ?Judgement: Good ?  ?Individualization: Pt was present and active in their participation of group discussion/activity. Goal and system skill setting identified  ?Modes of Intervention: Discussion and Education  ?Patient Response to Interventions:  Attentive, Engaged, Interested , Receptive, and Requested additional information/resources  ?  ?Plan: Continue to engage patient in OT groups 2 - 3x/week. ? ?01/17/2022  ?Brantley Stage, OT ?Cornell Barman, OT ? ?

## 2022-01-17 NOTE — Progress Notes (Signed)
?   01/16/22 2300  ?Psych Admission Type (Psych Patients Only)  ?Admission Status Voluntary  ?Psychosocial Assessment  ?Patient Complaints Anxiety;Depression  ?Eye Contact Brief  ?Facial Expression Anxious  ?Affect Depressed;Anxious  ?Speech Logical/coherent  ?Interaction Assertive  ?Motor Activity Fidgety  ?Appearance/Hygiene Unremarkable  ?Behavior Characteristics Unwilling to participate  ?Mood Anxious  ?Thought Process  ?Coherency Blocking  ?Content UTA  ?Delusions UTA  ?Perception UTA  ?Hallucination UTA  ?Judgment Poor  ?Confusion UTA  ?Danger to Self  ?Current suicidal ideation? Denies  ?Agreement Not to Harm Self Yes  ?Description of Agreement verbal  ?Danger to Others  ?Danger to Others None reported or observed  ? ? ?

## 2022-01-17 NOTE — [facility] Group Notes (Signed)
Adult Psychoeducational Group Note ? ?Date:  01/17/2022 ?Time:  3:00 PM ? ?Group Topic/Focus:  ?Goals Group:   The focus of this group is to help patients establish daily goals to achieve during treatment and discuss how the patient can incorporate goal setting into their daily lives to aide in recovery. ? ?Participation Level:  Active ? ?Participation Quality:  Appropriate ? ?Affect:  Appropriate ? ?Cognitive:  Appropriate ? ?Insight: Appropriate ? ?Engagement in Group:  Engaged ? ?Modes of Intervention:  Discussion ? ?Additional Comments:  Patient attended morning orientation group and participated.  ? ?Dmitry Macomber W Lorenda Peck ?01/17/2022, 3:00 PM ?

## 2022-01-18 ENCOUNTER — Inpatient Hospital Stay (HOSPITAL_COMMUNITY): Payer: Federal, State, Local not specified - Other

## 2022-01-18 DIAGNOSIS — B1920 Unspecified viral hepatitis C without hepatic coma: Secondary | ICD-10-CM

## 2022-01-18 DIAGNOSIS — B1710 Acute hepatitis C without hepatic coma: Secondary | ICD-10-CM | POA: Insufficient documentation

## 2022-01-18 LAB — PROTIME-INR
INR: 1 (ref 0.8–1.2)
Prothrombin Time: 12.7 seconds (ref 11.4–15.2)

## 2022-01-18 LAB — ACETAMINOPHEN LEVEL: Acetaminophen (Tylenol), Serum: <10 ug/mL — ABNORMAL LOW (ref 10–30)

## 2022-01-18 NOTE — Progress Notes (Signed)
D: Pt alert and oriented. Pt rates depression 0/10 and anxiety 3/10.  Pt denies experiencing any SI/HI, or AVH at this time.   A: Scheduled medications administered to pt, per MD orders. Support and encouragement provided. Frequent verbal contact made. Routine safety checks conducted q15 minutes.   R: No adverse drug reactions noted. Pt verbally contracts for safety at this time. Pt complaint with medications and treatment plan. Pt interacts well with others on the unit. Pt remains safe at this time. Will continue to monitor. 

## 2022-01-18 NOTE — Progress Notes (Signed)
Pt is upset with his recent Hepatits C diagnosis.  Pt says, "I can't believe that if I didn't get this blood test yesterday, I wouldn't have had any idea that I had hepatitis C."  Pt irritable this morning after getting ultrasound, but his mood improved throughout the day.Pt took medications without incident.  RN established rapport with pt and actively listened to pt describe pt's feelings. RN will continue to monitor and provide support as indicated. ?

## 2022-01-18 NOTE — Consult Note (Signed)
Initial Consultation Note ? ? ?Patient: Tristan [name] HDQ:[id number] DOB: Nov 11, 1985 PCP: Pcp, No ?DOA: 01/16/2022 ?DOS: the patient was seen and examined on 01/18/2022 ?Primary service: Phineas Inches, MD ? ?Referring physician: Dr. Mason Jim ?Reason for consult: Transaminitis ? ?Assessment/Plan: ?Assessment and Plan: ?No notes have been filed under this hospital service. ?Service: Hospitalist ?Elevated LFTs ?Hep C ?    - recent diagnosis of hep C in an urgent care roughly one week ago; no follow up on this prior to current admission to [facility] ?    - recommended team speak with GI about further w/u and tx; however, GI referred to hepatology - a service we do not have at [facility] ?    [facility] team has acquired info for outpt hepatology and are calling for assistance ?    - recommend that they check a HCV PCR, APAP level ?    - at this time, my recommendation remains the same; if HCV viral load is elevated, can speak with ID for Tx options; if negative then it is possible he has already cleared the virus and would need a follow up HCV viral load in 3 months ?    - RUQ [place] is negative ?    - trend another set of LFTs ?    - abdominal exam is benign and he has no jaundice ? ?Remainder per primary team. ? ?TRH will continue to follow the patient. ? ?HPI: Tristan [name] is a 36 y.o. male with past medical history of history of bipolar d/o, PTSD. Presenting to [facility] w/ AMS w/ auditory hallucination. TRH was consulted for elevated LFTs w/ a recent diagnosis of HCV. He was recently seen by Urgent Care on 4/17. During his visit, it was noted that his AST was 325 and his ALT 711. Hepatitis panel showed he was Hep C ab positive. It was recommended that he follow up with ID at discharge. Apparently he has not done this prior to this visit. He reports that he does engage in IV heroin abuse. He is not aware of any of his sexual partners having Hepatitis C. He has not noted abdominal distention/pain or jaundice.    ? ?Review of Systems: As mentioned in the history of present illness. All other systems reviewed and are negative. ?Past Medical History:  ?Diagnosis Date  ? Bipolar 1 disorder (HCC)   ? Hypertension   ? PTSD (post-traumatic stress disorder)   ? ?Past Surgical History:  ?Procedure Laterality Date  ? NO PAST SURGERIES    ? ?Social History:  reports that he has been smoking cigarettes. He has been smoking an average of .5 packs per day. He has never used smokeless tobacco. He reports current alcohol use. He reports current drug use. Drugs: Marijuana, Heroin, and Cocaine. ? ?Allergies  ?Allergen Reactions  ? Tomato Itching and Other (See Comments)  ?  Throat and tongue itches  ? ? ?Family History  ?Problem Relation Age of Onset  ? Other Father   ? Psychiatric Illness Father   ? ? ?Prior to Admission medications   ?Medication Sig Start Date End Date Taking? Authorizing Provider  ?FLUoxetine (PROZAC) 20 MG capsule Take 1 capsule (20 mg total) by mouth daily. 01/09/22   Estella Husk, MD  ?hydrOXYzine (ATARAX) 25 MG tablet Take 1 tablet (25 mg total) by mouth every 6 (six) hours as needed for anxiety. 01/08/22   Estella Husk, MD  ? ? ?Physical Exam: ?Vitals:  ? 01/17/22 0623 01/17/22 1649  01/18/22 0802 01/18/22 1143  ?BP: 132/77 121/74 127/66 134/72  ?Pulse: 87 82 69 89  ?Resp:      ?Temp:  98.3 ?F (36.8 ?C) 99.2 ?F (37.3 ?C)   ?TempSrc:  Oral Oral   ?SpO2: 100% 100% 100% 100%  ?Weight:      ?Height:      ? ?General: 36 y.o. male resting in bed in NAD ?Eyes: PERRL, normal sclera ?ENMT: Nares patent w/o discharge, orophaynx clear, dentition normal, ears w/o discharge/lesions/ulcers ?Neck: Supple, trachea midline ?Cardiovascular: RRR, +S1, S2, no m/g/r, equal pulses throughout ?Respiratory: CTABL, no w/r/r, normal WOB ?GI: BS+, NDNT, no masses noted, no organomegaly noted ?MSK: No e/c/c ?Neuro: A&O x 3, no focal deficits ?Psyc: Appropriate interaction and affect, calm/cooperative ? ?Data Reviewed:  ? ?There  are no new results to review at this time.  ? ?RUQ [place] ab: ?1. Hepatic steatosis. ?2. No gallstones, pericholecystic fluid or sonographic Murphy's sign. ?3. Decompressed gallbladder with mild circumferential gallbladder wall thickening. Wall thickening is nonspecific and may reflect incomplete distension. If there are clinical concerns for cholecystitis consider further investigation with nuclear medicine hepatic biliary scan. ? ? ?Family Communication: None at bedside. ?Thank you very much for involving [place] in the care of your patient. ? ?Author: ?Teddy Spike, DO ?01/18/2022 5:22 PM ? ?For on call review [email or web address].  ?

## 2022-01-18 NOTE — BHH Group Notes (Signed)
Patient did not attend the relaxation group. 

## 2022-01-18 NOTE — Progress Notes (Addendum)
[facility] MD Progress Note ? ?01/18/2022 2:22 PM ?Tristan [name]  ?MRN:  [id number] ?Subjective:  Patient is a 36 year old male with past psychiatric history of bipolar 1 disorder, polysubstance abuse, substance-induced mood disorder, heroin use disorder-severe dependence,  who initially presented to [facility], ED due to altered mental status.  He came under police custody as he was found running around the streets.  He reported that he was hearing voices. ? ?Chart review from last 24 hours-The patient's chart was reviewed and nursing notes were reviewed. Patient discussed in progression rounds with treatment team. MAR was reviewed and Pt is complaint  with scheduled medications and required PRN medications Vistaril x 2, yesterday, Vistaril x1, Ativan x1 today. Last CIWA 0.  Sleep hours not charted.  CSW informed that [facility] wants a note from IM Doctor saying patient is medically stable and does not need treatment for next 30 days. ? ?Pt is seen today in his room. Pt appears depressed. Pt states he is anxious.  Discussed that his ultrasound shows fatty liver and thickness of his gallbladder which is not concerning if he does not have any symptoms..  He denies any abdominal pain or new symptoms.  Discussed that we talked to IM, gastro and [facility] yesterday and they recommended to do PCR, trend LFTs and autoimmune testing.  Discussed that PCR will tell us if he has active infection or not.  He reports that when he was at [facility], they gave him a number for [facility].  He called [facility] and they want him to get an appointment for hep C treatment.  Discussed that he would have to be clean and sober to get treatment for hep C.  He reports that he did not sleep well last night and kept waking up.  He reports fair appetite.  He reports that his mood has been up and down but more depressed.  He reports that he is worried if he can come up with a plan of what he wants to do.  He is still interested in inpatient substance abuse  rehab and wants to go to [facility] treatment [facility].  He reports that he has an Careers adviser. Currently, Pt denies suicidal ideations, homicidal ideation and, visual and auditory hallucination. Pt denies any headache, nausea, vomiting, dizziness, chest pain, SOB, abdominal pain, diarrhea, and constipation.  He denies any first rank symptoms and ideas of reference.  He denies any paranoia.  He denies any withdrawal symptoms and cravings. Pt denies any medication side effects and has been tolerating it well. Pt denies any concerns.  ? ?Principal Problem: MDD (major depressive disorder), recurrent episode, severe (HCC) ?Diagnosis: Active Problems: ?  Polysubstance abuse (HCC) ?  Bipolar affective disorder, depressed, severe, with psychotic behavior (HCC) ?  Transaminitis ? ?Total Time spent with patient: 30 minutes ? ?Past Psychiatric History: see H&P ? ?Past Medical History:  ?Past Medical History:  ?Diagnosis Date  ? Bipolar 1 disorder (HCC)   ? Hypertension   ? PTSD (post-traumatic stress disorder)   ?  ?Past Surgical History:  ?Procedure Laterality Date  ? NO PAST SURGERIES    ? ?Family History:  ?Family History  ?Problem Relation Age of Onset  ? Other Father   ? Psychiatric Illness Father   ? ?Family Psychiatric  History: see H&P ?Social History:  ?Social History  ? ?Substance and Sexual Activity  ?Alcohol Use Yes  ? Comment: Pint per day  ?   ?Social History  ? ?Substance and Sexual Activity  ?  Drug Use Yes  ? Types: Marijuana, Heroin, Cocaine  ? Comment: denies drug use  ?  ?Social History  ? ?Socioeconomic History  ? Marital status: Single  ?  Spouse name: Not on file  ? Number of children: Not on file  ? Years of education: Not on file  ? Highest education level: Not on file  ?Occupational History  ? Not on file  ?Tobacco Use  ? Smoking status: Every Day  ?  Packs/day: 0.50  ?  Types: Cigarettes  ? Smokeless tobacco: Never  ?Vaping Use  ? Vaping Use: Never used  ?Substance and Sexual Activity  ?  Alcohol use: Yes  ?  Comment: Pint per day  ? Drug use: Yes  ?  Types: Marijuana, Heroin, Cocaine  ?  Comment: denies drug use  ? Sexual activity: Not Currently  ?Other Topics Concern  ? Not on file  ?Social History Narrative  ? ** Merged History Encounter **  ?    ? ?Social Determinants of Health  ? ?Financial Resource Strain: Not on file  ?Food Insecurity: Not on file  ?Transportation Needs: Not on file  ?Physical Activity: Not on file  ?Stress: Not on file  ?Social Connections: Not on file  ? ?Additional Social History:  ?  ?  ?  ?  ?  ?  ?  ?  ?  ?  ?  ? ?Sleep: Fair ? ?Appetite: Fair ? ?Current Medications: ?Current Facility-Administered Medications  ?Medication Dose Route Frequency Provider Last Rate Last Admin  ? alum & mag hydroxide-simeth (MAALOX/MYLANTA) 200-200-20 MG/5ML suspension 30 mL  30 mL Oral Q4H PRN Nira ConnBerry, Jason A, NP      ? escitalopram (LEXAPRO) tablet 10 mg  10 mg Oral Daily Leone Havenoda, Vandana, MD   10 mg at 01/18/22 1024  ? hydrOXYzine (ATARAX) tablet 25 mg  25 mg Oral TID PRN Nira ConnBerry, Jason A, NP   25 mg at 01/18/22 1027  ? ibuprofen (ADVIL) tablet 400 mg  400 mg Oral Q6H PRN Comer LocketSingleton, Kayleah Appleyard E, MD   400 mg at 01/16/22 1935  ? loperamide (IMODIUM) capsule 2-4 mg  2-4 mg Oral PRN Karsten Rooda, Vandana, MD      ? LORazepam (ATIVAN) tablet 1 mg  1 mg Oral Q6H PRN Karsten Rooda, Vandana, MD   1 mg at 01/18/22 [id number]  ? magnesium hydroxide (MILK OF MAGNESIA) suspension 30 mL  30 mL Oral Daily PRN Nira ConnBerry, Jason A, NP   30 mL at 01/16/22 1654  ? multivitamin with minerals tablet 1 tablet  1 tablet Oral Daily Karsten Rooda, Vandana, MD   1 tablet at 01/18/22 0950  ? nicotine polacrilex (NICORETTE) gum 2 mg  2 mg Oral PRN Jackelyn PolingBerry, Jason A, NP   2 mg at 01/16/22 1933  ? ondansetron (ZOFRAN-ODT) disintegrating tablet 4 mg  4 mg Oral Q6H PRN Karsten Rooda, Vandana, MD   4 mg at 01/16/22 1933  ? pantoprazole (PROTONIX) EC tablet 40 mg  40 mg Oral Daily Mason JimSingleton, Kara Mierzejewski E, MD   40 mg at 01/18/22 1026  ? QUEtiapine (SEROQUEL) tablet 100 mg  100 mg Oral QHS  Karsten Rooda, Vandana, MD   100 mg at 01/17/22 2217  ? thiamine tablet 100 mg  100 mg Oral Daily Karsten Rooda, Vandana, MD   100 mg at 01/18/22 1026  ? ? ?Lab Results:  ?Results for orders placed or performed during the hospital encounter of 01/16/22 (from the past 48 hour(s))  ?Hepatic function panel     Status: Abnormal  ?  Collection Time: 01/17/22  6:57 AM  ?Result Value Ref Range  ? Total Protein 6.6 6.5 - 8.1 g/dL  ? Albumin 3.5 3.5 - 5.0 g/dL  ? AST 368 (H) 15 - 41 U/L  ? ALT 798 (H) 0 - 44 U/L  ? Alkaline Phosphatase 101 38 - 126 U/L  ? Total Bilirubin 0.9 0.3 - 1.2 mg/dL  ? Bilirubin, Direct 0.1 0.0 - 0.2 mg/dL  ? Indirect Bilirubin 0.8 0.3 - 0.9 mg/dL  ?  Comment: Performed at [facility], [address], [place], [place] [address]  ?HIV Antibody (routine testing w rflx)     Status: None  ? Collection Time: 01/17/22  6:57 AM  ?Result Value Ref Range  ? HIV Screen 4th Generation wRfx Non Reactive Non Reactive  ?  Comment: Performed at [facility], [address] [place], [place] [address]  ?Protime-INR     Status: None  ? Collection Time: 01/18/22  6:10 AM  ?Result Value Ref Range  ? Prothrombin Time 12.7 11.4 - 15.2 seconds  ? INR 1.0 0.8 - 1.2  ?  Comment: (NOTE) ?INR goal varies based on device and disease states. ?Performed at [facility], [address] ?[place], [place] [address] ?  ?Acetaminophen level     Status: Abnormal  ? Collection Time: 01/18/22  6:10 AM  ?Result Value Ref Range  ? Acetaminophen (Tylenol), Serum <10 (L) 10 - 30 ug/mL  ?  Comment: (NOTE) ?Therapeutic concentrations vary significantly. A range of 10-30 ug/mL  ?may be an effective concentration for many patients. However, some  ?are best treated at concentrations outside of this range. ?Acetaminophen concentrations >150 ug/mL at 4 hours after ingestion  ?and >50 ug/mL at 12 hours after ingestion are often associated with  ?toxic reactions. ? ?Performed at [facility], [address] ?[place], [place] [address] ?  ? ? ?Blood Alcohol level:  ?Lab Results  ?Component Value Date  ? ETH <10 01/15/2022  ? ETH <10 01/07/2022  ? ? ?Metabolic Disorder Labs: ?Lab Results  ?Component Value Dat

## 2022-01-18 NOTE — Plan of Care (Addendum)
I called [facility] 01/18/22 to advocate for patient's acceptance. They initially told SW that he had to have Hep C medication started before he could be accepted. Since he has Hep C Ab positive but no confirmed active viral infection with Hep C Quant pending, I called to see if they would reconsider his application. I spoke to a receptionist who states the nurse Jamesetta So) and director are out of the office. I spoke to Wilhelmenia Blase who is residential supervisor who provided me the Medical Director's contact (Dr. Delene Loll). The [facility] location referred me to the [facility] where Dr Claudie Revering was thought to be working [phone]) and I called. They transferred me to her at the [facility].  ? ?I spoke with Dr. Claudie Revering and explained the case in detail including lab results, [place] results, and recommendations per GI. She feels that with his LFT elevation that an internal medicine consult needs to be obtained prior to them reconsidering his case. She understands that his Hep C w/u is pending and may not be resulted quickly. She states IM needs to assess him and make sure he can program at their facility and can potentially delay outpatient care for 30 days while in rehab. They are a non-medical facility and feel he needs further medical clearance before he can be accepted. I have asked Automotive engineer to reconsult IM. Dr. Claudie Revering states she will follow up and I provided my cell for contact.  ? ?Bartholomew Crews, MD, FAPA ? ?Addendum: I spoke with Dr Ronaldo Miyamoto with Internal Medicine regarding consult and he agrees to see the patient later today.  ?Bartholomew Crews, MD, FAPA ?

## 2022-01-18 NOTE — Group Note (Signed)
LCSW Group Therapy Note ? ? ?Group Date: 01/18/2022 ?Start Time: 1300 ?End Time: 1400 ? ?Type of Therapy and Topic:  Group Therapy - Healthy vs Unhealthy Coping Skills ? ?Participation Level:  Active  ? ?Description of Group ?The focus of this group was to determine what unhealthy coping techniques typically are used by group members and what healthy coping techniques would be helpful in coping with various problems. Patients were guided in becoming aware of the differences between healthy and unhealthy coping techniques. Patients were asked to identify 2-3 healthy coping skills they would like to learn to use more effectively. ? ?Therapeutic Goals ?Patients learned that coping is what human beings do all day long to deal with various situations in their lives ?Patients defined and discussed healthy vs unhealthy coping techniques ?Patients identified their preferred coping techniques and identified whether these were healthy or unhealthy ?Patients determined 2-3 healthy coping skills they would like to become more familiar with and use more often. ?Patients provided support and ideas to each other ? ? ?Summary of Patient Progress:  Groups did not occur due to staffing challenges.  A packet that included worksheets and information regarding coping skills was provided to the Pt.  The Pt was given time to ask questions and express any concerns with the CSW.   ? ? ?Therapeutic Modalities ?Cognitive Behavioral Therapy ?Motivational Interviewing ? ?Aram Beecham, LCSWA ?01/18/2022  1:41 PM   ? ?

## 2022-01-18 NOTE — Group Note (Signed)
Date:  01/18/2022 ?Time:  10:29 AM ? ?Group Topic/Focus:  ?Orientation:   The focus of this group is to educate the patient on the purpose and policies of crisis stabilization and provide a format to answer questions about their admission.  The group details unit policies and expectations of patients while admitted. ? ? ? ?Participation Level:  Did Not Attend ? ?Participation Quality:   ? ?Affect:   ? ?Cognitive:   ? ?Insight:  ? ?Engagement in Group:   ? ?Modes of Intervention:   ? ?Additional Comments:   ? ?Tristan [name] ?01/18/2022, 10:29 AM ? ?

## 2022-01-18 NOTE — Progress Notes (Addendum)
[facility] ? ?01/19/2022 6:59 AM ?Tristan [name]  ?MRN:  [id number] ? ?Chief Complaint: SI ? ?Reason for Admission:  ?Tristan [name] is a 36 y.o. male with a history of bipolar d/o, SIMD, and polysubstance abuse, who was initially admitted for inpatient psychiatric hospitalization on 01/16/2022 for management of altered mental status, SI and AVH in the context of polysubstance abuse. The patient is currently on Hospital Day 3.  ? ?Chart Review from last 24 hours:  ?The patient's chart was reviewed and nursing notes were reviewed. The patient's case was discussed in multidisciplinary team meeting. Per nursing, the patient did not attend groups. He had no acute behavioral issues or safety concerns noted. Per MAR he was compliant with scheduled medications and did receive Vistaril X1 for anxiety and Ativan X1 for anxiety. ? ?Information Obtained Today During Patient Interview: ?The patient was seen and evaluated on the unit. On assessment today the patient reports that he did not sleep well with frequent awakenings overnight. He reports fair appetite. He voices no physical complaints and specifically denies issues with nausea, vomiting, diarrhea, constipation or abdominal pain.  He denies any current cravings for substances or signs of withdrawal.  He states he feels mildly anxious in regards to his newfound health concerns but is trying to process this information.  He denies SI, HI, AVH, paranoia, ideas of reference or first rank symptoms.  He was encouraged to get out of bed and to attend groups.  I made him aware of the fact that we are still trying to work with [facility] but it is unlikely that they may accept his case given his hep C questionable status and his elevated liver enzymes.  I made him aware that social work is trying to see other residential rehab options he may qualify for given his health issues, and we again discussed the need for outpatient follow-up for LFT elevation  and pending viral load without fail after discharge.  He requests something additional to help with anxiety and was in agreement for trial of Neurontin after risk benefits and side effects of med were discussed. ? ?Principal Problem: MDD (major depressive disorder), recurrent episode, severe (HCC) ?Diagnosis: Active Problems: ?  Polysubstance abuse (HCC) ?  Bipolar affective disorder, depressed, severe, with psychotic behavior (HCC) ?  Transaminitis ?  Hepatitis C ? ?Total Time Spent in Direct Patient Care:  ?I personally spent 50 minutes on the unit in direct patient care. The direct patient care time included face-to-face time with the patient, reviewing the patient's chart, communicating with other professionals, and coordinating care. Greater than 50% of this time was spent in counseling or coordinating care with the patient regarding goals of hospitalization, psycho-education, and discharge planning needs. ? ?Past Psychiatric History: see H&P ? ?Past Medical History:  ?Past Medical History:  ?Diagnosis Date  ? Bipolar 1 disorder (HCC)   ? Hypertension   ? PTSD (post-traumatic stress disorder)   ?  ?Past Surgical History:  ?Procedure Laterality Date  ? NO PAST SURGERIES    ? ?Family History:  ?Family History  ?Problem Relation Age of Onset  ? Other Father   ? Psychiatric Illness Father   ? ?Family Psychiatric  History: see H&P ? ?Social History:  ?Social History  ? ?Substance and Sexual Activity  ?Alcohol Use Yes  ? Comment: Pint per day  ?   ?Social History  ? ?Substance and Sexual Activity  ?Drug Use Yes  ? Types: Marijuana, Heroin, Cocaine  ? Comment:  denies drug use  ?  ?Social History  ? ?Socioeconomic History  ? Marital status: Single  ?  Spouse name: Not on file  ? Number of children: Not on file  ? Years of education: Not on file  ? Highest education level: Not on file  ?Occupational History  ? Not on file  ?Tobacco Use  ? Smoking status: Every Day  ?  Packs/day: 0.50  ?  Types: Cigarettes  ? Smokeless  tobacco: Never  ?Vaping Use  ? Vaping Use: Never used  ?Substance and Sexual Activity  ? Alcohol use: Yes  ?  Comment: Pint per day  ? Drug use: Yes  ?  Types: Marijuana, Heroin, Cocaine  ?  Comment: denies drug use  ? Sexual activity: Not Currently  ?Other Topics Concern  ? Not on file  ?Social History Narrative  ? ** Merged History Encounter **  ?    ? ?Social Determinants of Health  ? ?Financial Resource Strain: Not on file  ?Food Insecurity: Not on file  ?Transportation Needs: Not on file  ?Physical Activity: Not on file  ?Stress: Not on file  ?Social Connections: Not on file  ? ?Sleep: Fair per patient report ? ?Appetite:  Fair ? ?Current Medications: ?Current Facility-Administered Medications  ?Medication Dose Route Frequency Provider Last Rate Last Admin  ? alum & mag hydroxide-simeth (MAALOX/MYLANTA) 200-200-20 MG/5ML suspension 30 mL  30 mL Oral Q4H PRN Nira Conn A, NP      ? escitalopram (LEXAPRO) tablet 10 mg  10 mg Oral Daily Leone Haven, Vandana, MD   10 mg at 01/18/22 1024  ? hydrOXYzine (ATARAX) tablet 25 mg  25 mg Oral TID PRN Nira Conn A, NP   25 mg at 01/18/22 1027  ? ibuprofen (ADVIL) tablet 400 mg  400 mg Oral Q6H PRN Comer Locket, MD   400 mg at 01/16/22 1935  ? loperamide (IMODIUM) capsule 2-4 mg  2-4 mg Oral PRN Karsten Ro, MD      ? LORazepam (ATIVAN) tablet 1 mg  1 mg Oral Q6H PRN Karsten Ro, MD   1 mg at 01/18/22 8938  ? magnesium hydroxide (MILK OF MAGNESIA) suspension 30 mL  30 mL Oral Daily PRN Nira Conn A, NP   30 mL at 01/16/22 1654  ? multivitamin with minerals tablet 1 tablet  1 tablet Oral Daily Karsten Ro, MD   1 tablet at 01/18/22 0950  ? nicotine polacrilex (NICORETTE) gum 2 mg  2 mg Oral PRN Jackelyn Poling, NP   2 mg at 01/16/22 1933  ? ondansetron (ZOFRAN-ODT) disintegrating tablet 4 mg  4 mg Oral Q6H PRN Karsten Ro, MD   4 mg at 01/16/22 1933  ? pantoprazole (PROTONIX) EC tablet 40 mg  40 mg Oral Daily Mason Jim, Caysie Minnifield E, MD   40 mg at 01/18/22 1026  ?  QUEtiapine (SEROQUEL) tablet 100 mg  100 mg Oral QHS Karsten Ro, MD   100 mg at 01/18/22 2136  ? thiamine tablet 100 mg  100 mg Oral Daily Karsten Ro, MD   100 mg at 01/18/22 1026  ? ? ?Lab Results:  ?Results for orders placed or performed during the hospital encounter of 01/16/22 (from the past 48 hour(s))  ?Protime-INR     Status: None  ? Collection Time: 01/18/22  6:10 AM  ?Result Value Ref Range  ? Prothrombin Time 12.7 11.4 - 15.2 seconds  ? INR 1.0 0.8 - 1.2  ?  Comment: (NOTE) ?INR goal varies based  on device and disease states. ?Performed at [facility], [address] ?[place], [place] [address] ?  ?Acetaminophen level     Status: Abnormal  ? Collection Time: 01/18/22  6:10 AM  ?Result Value Ref Range  ? Acetaminophen (Tylenol), Serum <10 (L) 10 - 30 ug/mL  ?  Comment: (NOTE) ?Therapeutic concentrations vary significantly. A range of 10-30 ug/mL  ?may be an effective concentration for many patients. However, some  ?are best treated at concentrations outside of this range. ?Acetaminophen concentrations >150 ug/mL at 4 hours after ingestion  ?and >50 ug/mL at 12 hours after ingestion are often associated with  ?toxic reactions. ? ?Performed at [facility], [address] ?[place], [place] [address] ?  ? ? ?Blood Alcohol level:  ?Lab Results  ?Component Value Date  ? ETH <10 01/15/2022  ? ETH <10 01/07/2022  ? ? ?Metabolic Disorder Labs: ?Lab Results  ?Component Value Date  ? HGBA1C 5.1 11/21/2021  ? MPG 99.67 11/21/2021  ? MPG 96.8 02/22/2019  ? ?Lab Results  ?Component Value Date  ? PROLACTIN 4.5 11/21/2021  ? ?Lab Results  ?Component Value Date  ? CHOL 154 11/21/2021  ? TRIG 68 11/21/2021  ? HDL 74 11/21/2021  ? CHOLHDL 2.1 11/21/2021  ? VLDL 14 11/21/2021  ? LDLCALC 66 11/21/2021  ? LDLCALC 66 02/22/2019  ? ? ?Physical Findings: ?AIMS: Facial and Oral Movements ?Muscles of Facial Expression: None, normal ?Lips and Perioral Area: None, normal ?Jaw: None,  normal ?Tongue: None, normal,Extremity Movements ?Upper (arms, wrists, hands, fingers): None, normal ?Lower (legs, knees, ankles, toes): None, normal, Trunk Movements ?Neck, shoulders, hips: None, normal, Overall Sev

## 2022-01-19 LAB — ANA W/REFLEX IF POSITIVE: Anti Nuclear Antibody (ANA): NEGATIVE

## 2022-01-19 LAB — HEPATIC FUNCTION PANEL
ALT: 847 U/L — ABNORMAL HIGH (ref 0–44)
AST: 318 U/L — ABNORMAL HIGH (ref 15–41)
Albumin: 3.8 g/dL (ref 3.5–5.0)
Alkaline Phosphatase: 98 U/L (ref 38–126)
Bilirubin, Direct: 0.1 mg/dL (ref 0.0–0.2)
Indirect Bilirubin: 0.7 mg/dL (ref 0.3–0.9)
Total Bilirubin: 0.8 mg/dL (ref 0.3–1.2)
Total Protein: 7.5 g/dL (ref 6.5–8.1)

## 2022-01-19 LAB — HCV RNA QUANT
HCV Quantitative Log: 5.928 log10 IU/mL (ref >1.70)
HCV Quantitative: 847000 IU/mL (ref >50)

## 2022-01-19 LAB — IGG, IGA, IGM
IgA: 163 mg/dL (ref 90–386)
IgG (Immunoglobin G), Serum: 1335 mg/dL (ref 603–1613)
IgM (Immunoglobulin M), Srm: 327 mg/dL — ABNORMAL HIGH (ref 20–172)

## 2022-01-19 MED ORDER — GABAPENTIN 100 MG PO CAPS
100.0000 mg | ORAL_CAPSULE | Freq: Three times a day (TID) | ORAL | Status: DC
  Administered 2022-01-19 – 2022-01-22 (×10): 100 mg via ORAL
  Filled 2022-01-19 (×3): qty 1
  Filled 2022-01-19: qty 21
  Filled 2022-01-19 (×2): qty 1
  Filled 2022-01-19 (×2): qty 21
  Filled 2022-01-19 (×8): qty 1

## 2022-01-19 NOTE — B[facility] Group Notes (Signed)
Goals Group ?4/29//2023 ? ? ?Group Focus: affirmation, clarity of thought, and goals/reality orientation ?Treatment Modality:  Psychoeducation ?Interventions utilized were assignment, group exercise, and support ?Purpose: To be able to understand and verbalize the reason for their admission to the hospital. To understand that the medication helps with their chemical imbalance but they also need to work on their choices in life. To be challenged to develop [name] list of 30 positives about themselves. Also introduce the concept that "feelings" are not reality. ? ?Participation Level:  Active ? ?Participation Quality:  Appropriate ? ?Affect:  Appropriate ? ?Cognitive:  Appropriate ? ?Insight:  Improving ? ?Engagement in Group:  Engaged ? ?Additional Comments:  ... ? ?Tristan [name] ?

## 2022-01-19 NOTE — Hospital Course (Signed)
36 year old man admitted at [facility] for psychiatric stabilization and management of altered mental status, suicidal ideation in the context of polysubstance abuse.  Hospitalist service was consulted for elevated LFTs and positive hepatitis C antibody. ?

## 2022-01-19 NOTE — Group Note (Signed)
LCSW Group Therapy Note ? ?No social work group was held today due to staffing of one social worker on Adult unit and  high number of admissions that required initial psychosocial assessments.  The following was provided to the patient in lieu of in-person group: ? ?Healthy vs. Unhealthy Supports and Coping Skills ? ? ?Unhealthy                                                              Healthy ?Works (at first) Works   ?Stops working or starts hurting Continues working  ?Fast Usually takes time to develop  ?Easy Often difficult to learn  ?Usually a habit Usually unknown, has to become a habit  ?Can do alone Often need to reach out for help   ?Leads to loss Leads to gain  ?   ?   ? ?My Unhealthy Coping Skills                                    My Healthy Coping Skills ?   ?   ?   ?   ?   ?   ?   ? ?My Unhealthy Supports                                           My Healthy Supports ?   ?   ?   ?   ?   ?   ?   ? ? ? ? ? ?Use the stationery provided to write a Goodbye Letter to one of your unhealthy coping skills or unhealthy supports. ? ?Share with other patients as you desire. ? ?Keisean Skowron Grossman-Orr, LCSW ?01/19/2022  ?3:37 PM  ?   ?

## 2022-01-19 NOTE — Assessment & Plan Note (Addendum)
--  transaminases WNL 11/2021, have been elevated and overall stable.  Right upper quadrant ultrasound was unrevealing.  Hepatitis C RNA PCR was positive; taken together, picture is consistent with acute hepatitis C infection ?-- Patient is asymptomatic with no signs or symptoms of liver failure. ?-- Patient will follow-up with infectious disease clinic for consideration of treatment for hepatitis C, in the meantime follow LFTs every 48 hours as well as PT/INR.  If develops abdominal pain, encephalopathy or decompensates, please call [facility] for consideration of direct admission. ?--will follow from a distance, please call me via amion or message me via secure chat with questions ?

## 2022-01-19 NOTE — [facility] Group Notes (Signed)
.  Psychoeducational Group Note ? ? ? ?Date:  4/29//23 ?Time: 1300-1400 ? ? ? ?Purpose of Group: . The group focus' on teaching patients on how to identify their needs and their Life Skills:  [name] group where two lists are made. What people need and what are things that we do that are unhealthy. The lists are developed by the patients and it is explained that we often do the actions that are not healthy to get our list of needs met. ? ?Goal:: to develop the coping skills needed to get their needs met ? ?Participation Level:  did not attend ? ?Tristan [name] ? ?

## 2022-01-19 NOTE — Assessment & Plan Note (Deleted)
--  repeat LFTs show AST 318 and ALT 847 today - contacted hospitalist (Dr Katrinka Blazing) today for any additional recommendations since LFTs have slightly increased and he feels LFTs are not exponentially increasing and can be monitored since patient is not presently jaundiced or encephalopathic- he recommended that I reach out to ID for additional recommendations. I paged Dr. Drue Second with ID and discussed the case and she reviewed the chart - Since his LFTs were elevated prior to admission, it is unlikely that transaminitis is coming from present psychiatric medications. She will have her clinic schedule for outpatient f/u and recommends we check LFTs every other day for trending and PT/INR every other lab draw for trending. If he becomes encephalopathic or develops abdominal pain or if LFTs continue to trend up we should reconsult ID for potential inpatient transfer. No further treatment at this time pending viral load. Patient made aware of these recommendations. Rechecking LFTs Monday ?-- PT 12.7/INR 1.0 - repeating Monday ?--ANA, ASMA, and immunoglobulin G, mitochondrial antibodies-?pending ?--?Tylenol level <10 ?-- RUQ u/s shows hepatic steatosis, no gallstones or pericholecystic fluid, decompressed gallbladder with mild circumferential gallbladder wall thickening with is nonspecific ?

## 2022-01-19 NOTE — Progress Notes (Addendum)
?  Progress Note ? ? ?Patient: Tristan [name] JIR:[id number] DOB: Oct 17, 1985 DOA: 01/16/2022     3 ?DOS: the patient was seen and examined on 01/19/2022 ?  ?Brief hospital course: ?36 year old [facility] and management of altered mental status, suicidal ideation in the context of polysubstance abuse.  Hospitalist service was consulted for elevated LFTs and positive hepatitis C antibody. ? ?Assessment and Plan: ?* Acute hepatitis C virus infection without hepatic coma ?--transaminases WNL 11/2021, have been elevated and overall stable.  Right upper quadrant ultrasound was unrevealing.  Hepatitis C RNA PCR was positive; taken together, picture is consistent with acute hepatitis C infection ?-- Patient is asymptomatic with no signs or symptoms of liver failure. ?-- Patient will follow-up with infectious disease clinic for consideration of treatment for hepatitis C, in the meantime follow LFTs every 48 hours as well as PT/INR.  If develops abdominal pain, encephalopathy or decompensates, please call [facility] for consideration of direct admission. ?--will follow from a distance, please call me via amion or message me via secure chat with questions ? ?Other issues per primary ? ? ?  ? ?Subjective:  ?Feels ok ?No abdominal pin ? ?Physical Exam: ?Vitals:  ? 01/19/22 0640 01/19/22 0641 01/19/22 1210 01/19/22 1618  ?BP: 116/77 112/81 103/75 (!) 112/99  ?Pulse: 79 94 80 (!) 218  ?Resp: 18     ?Temp: 98.7 ?F (37.1 ?C)     ?TempSrc: Oral     ?SpO2: 100%     ?Weight:      ?Height:      ? ?Physical Exam ?Vitals reviewed.  ?Constitutional:   ?   General: He is not in acute distress. ?   Appearance: He is not ill-appearing or toxic-appearing.  ?Cardiovascular:  ?   Rate and Rhythm: Normal rate and regular rhythm.  ?   Heart sounds: No murmur heard. ?Pulmonary:  ?   Effort: Pulmonary effort is normal. No respiratory distress.  ?   Breath sounds: No wheezing, rhonchi or  rales.  ?Neurological:  ?   Mental Status: He is alert.  ?Psychiatric:     ?   Mood and Affect: Mood normal.     ?   Behavior: Behavior normal.  ?Eating ice cream ? ?Data Reviewed: ? ?AST 368 > 318 ?ALT 798 > 847 ?AP and T bili WNL ? ?Family Communication: none ? ?Time spent: 20 minutes ? ?Author: ?Brendia Sacks, MD ?01/19/2022 5:02 PM ? ?For on call review [email or web address].  ?

## 2022-01-19 NOTE — Progress Notes (Signed)
?   01/19/22 2149  ?Psych Admission Type (Psych Patients Only)  ?Admission Status Voluntary  ?Psychosocial Assessment  ?Patient Complaints Anxiety  ?Eye Contact Brief  ?Facial Expression Animated  ?Affect Appropriate to circumstance  ?Speech Logical/coherent  ?Interaction Minimal  ?Motor Activity Other (Comment) ?(WDL)  ?Appearance/Hygiene In scrubs  ?Behavior Characteristics Appropriate to situation  ?Mood Anxious;Pleasant  ?Thought Process  ?Coherency WDL  ?Content WDL  ?Delusions None reported or observed  ?Perception WDL  ?Hallucination None reported or observed  ?Judgment Impaired  ?Confusion None  ?Danger to Self  ?Current suicidal ideation? Denies  ?Agreement Not to Harm Self Yes  ?Description of Agreement verbal  ?Danger to Others  ?Danger to Others None reported or observed  ? ? ?

## 2022-01-19 NOTE — Progress Notes (Addendum)
Pt lying bed in bed resting. Pt denies ideations and hallucinations. Pt is med compliant. ?

## 2022-01-19 NOTE — Progress Notes (Signed)

## 2022-01-20 LAB — MITOCHONDRIAL ANTIBODIES: Mitochondrial M2 Ab, IgG: 27.6 Units — ABNORMAL HIGH (ref 0.0–20.0)

## 2022-01-20 LAB — ANTI-SMOOTH MUSCLE ANTIBODY, IGG: F-Actin IgG: 10 Units (ref 0–19)

## 2022-01-20 NOTE — [facility] Group Notes (Signed)
Adult Psychoeducational Group  ?Date:  01/20/2022 ?Time:  1300-1400 ? ?Group Topic/Focus: Continuation of the group from Saturday. Looking at the lists that were created and talking about what needs to be done with the homework of 30 positives about themselves.  ?                                   Talking about taking their power back and helping themselves to develop [name] positive self esteem. ?     ?Participation Quality:  did not attend ?Tristan [name] ? ? ?

## 2022-01-20 NOTE — Group Note (Signed)
[facility] LCSW Group Therapy Note ? ?Date/Time:  01/20/2022   ? ?Type of Therapy and Topic:  Group Therapy:  Using Music to Encourage Yourself ? ?Participation Level:  Minimal  ? ?Description of Group: ?In this process group, members listened to a variety of music through choosing from CSW's list #1 through #25.  Patients identified the messages received from those songs and how the music affected their emotions.  Patients were encouraged to use music as a coping skill at home, but to be mindful of the choices made.  Patients discussed how this knowledge can help with wellness and recovery in various ways including managing depression and anxiety as well as encouraging healthy sleep habits.   ? ?Therapeutic Goals: ?Patients will explore the impact of different songs on mood ?Patients will verbalize the thoughts they have when listening to different types of music ?Patients will identify music that is soothing to them as well as music that is energizing to them ?Patients will discuss how to use this knowledge to assist in maintaining wellness and recovery ?Patients will explore the use of music as a coping skill ?Patients will encourage one another ? ?Summary of Patient Progress:  At the beginning of group, patient was not present.  He came in toward the end of group and showed an emotional reaction to one of the songs, received hugs from other group members as a result. ? ?Therapeutic Modalities: ?Solution Focused Brief Therapy ?Activity ? ? ?Ambrose Mantle, LCSW ?  ?

## 2022-01-20 NOTE — [facility] Group Notes (Signed)
Adult Psychoeducational Group Not ?Date:  01/20/2022 ?Time:  0900-1045 ?Group Topic/Focus: PROGRESSIVE RELAXATION. A group where deep breathing is taught and tensing and relaxation muscle groups is used. Imagery is used as well.  Pts are asked to imagine 3 pillars that hold them up when they are not able to hold themselves up and to share that with the group. ? ?Participation Level:  did not attend ? ?Claryssa Sandner A ? ?

## 2022-01-20 NOTE — Progress Notes (Signed)
[facility] MD Progress Note ? ?01/20/2022 12:56 PM ?Tristan [name]  ?MRN:  [id number] ? ?Chief Complaint: SI ? ?Reason for Admission:  ?Tristan [name] is a 36 y.o. male with a history of bipolar d/o, SIMD, and polysubstance abuse, who was initially admitted for inpatient psychiatric hospitalization on 01/16/2022 for management of altered mental status, SI and AVH in the context of polysubstance abuse. The patient is currently on Hospital Day 4.  ? ?Chart Review from last 24 hours:  ?The patient's chart was reviewed and nursing notes were reviewed. The patient's case was discussed in multidisciplinary team meeting. Per nursing, he attended select groups and had no acute safety concerns or psychosis noted. Per MAR, he was compliant with scheduled medications and did receive Vistaril X2 yesterday for anxiety. ? ?Information Obtained Today During Patient Interview: ?The patient was seen and evaluated on the unit.  He states he is frustrated and a part of him wants to leave but another part of him is worried about relapse and how to manage his medical issues on the street if he leaves without housing established.  He understands that he is unlikely to get into a substance abuse treatment program given his medical complications.  He states that the hospitalist talked with him yesterday and explained that he does have an active hep C infection.  I had explained that we will check his LFTs tomorrow for trending and we are working to get him outpatient follow-up appointments with infectious disease and primary care provider for treatment of his Hep C.  We discussed the option of a potential transfer to an [facility] or other sober living facility while he is waiting to have his medical needs addressed as an outpatient.  He denies SI, HI, AVH, paranoia, ideas of reference or first rank symptoms.  He states he still feels anxious in regards to his ongoing health issues.  He states his appetite is "okay" and he  denies any abdominal pain, nausea vomiting diarrhea constipation or acute GI symptoms.  He states he has been going to groups.  He voices no other physical complaints and denies medication side effects.  He denies cravings or current signs of withdrawal. ? ?Principal Problem: Acute hepatitis C virus infection without hepatic coma ?Diagnosis: Principal Problem: ?  Acute hepatitis C virus infection without hepatic coma ?Active Problems: ?  Polysubstance abuse (HCC) ?  Bipolar affective disorder, depressed, severe, with psychotic behavior (HCC) ?  Transaminitis ? ?Total Time Spent in Direct Patient Care:  ?I personally spent 25 minutes on the unit in direct patient care. The direct patient care time included face-to-face time with the patient, reviewing the patient's chart, communicating with other professionals, and coordinating care. Greater than 50% of this time was spent in counseling or coordinating care with the patient regarding goals of hospitalization, psycho-education, and discharge planning needs. ? ?Past Psychiatric History: see H&P ? ?Past Medical History:  ?Past Medical History:  ?Diagnosis Date  ? Bipolar 1 disorder (HCC)   ? Hypertension   ? PTSD (post-traumatic stress disorder)   ?  ?Past Surgical History:  ?Procedure Laterality Date  ? NO PAST SURGERIES    ? ?Family History:  ?Family History  ?Problem Relation Age of Onset  ? Other Father   ? Psychiatric Illness Father   ? ?Family Psychiatric  History: see H&P ? ?Social History:  ?Social History  ? ?Substance and Sexual Activity  ?Alcohol Use Yes  ? Comment: Pint per day  ?   ?Social  History  ? ?Substance and Sexual Activity  ?Drug Use Yes  ? Types: Marijuana, Heroin, Cocaine  ? Comment: denies drug use  ?  ?Social History  ? ?Socioeconomic History  ? Marital status: Single  ?  Spouse name: Not on file  ? Number of children: Not on file  ? Years of education: Not on file  ? Highest education level: Not on file  ?Occupational History  ? Not on file   ?Tobacco Use  ? Smoking status: Every Day  ?  Packs/day: 0.50  ?  Types: Cigarettes  ? Smokeless tobacco: Never  ?Vaping Use  ? Vaping Use: Never used  ?Substance and Sexual Activity  ? Alcohol use: Yes  ?  Comment: Pint per day  ? Drug use: Yes  ?  Types: Marijuana, Heroin, Cocaine  ?  Comment: denies drug use  ? Sexual activity: Not Currently  ?Other Topics Concern  ? Not on file  ?Social History Narrative  ? ** Merged History Encounter **  ?    ? ?Social Determinants of Health  ? ?Financial Resource Strain: Not on file  ?Food Insecurity: Not on file  ?Transportation Needs: Not on file  ?Physical Activity: Not on file  ?Stress: Not on file  ?Social Connections: Not on file  ? ?Sleep: Fair per patient report ? ?Appetite:  Good ? ?Current Medications: ?Current Facility-Administered Medications  ?Medication Dose Route Frequency Provider Last Rate Last Admin  ? alum & mag hydroxide-simeth (MAALOX/MYLANTA) 200-200-20 MG/5ML suspension 30 mL  30 mL Oral Q4H PRN Nira Conn A, NP      ? escitalopram (LEXAPRO) tablet 10 mg  10 mg Oral Daily Karsten Ro, MD   10 mg at 01/20/22 3545  ? gabapentin (NEURONTIN) capsule 100 mg  100 mg Oral TID Bartholomew Crews E, MD   100 mg at 01/20/22 0830  ? hydrOXYzine (ATARAX) tablet 25 mg  25 mg Oral TID PRN Jackelyn Poling, NP   25 mg at 01/20/22 1049  ? ibuprofen (ADVIL) tablet 400 mg  400 mg Oral Q6H PRN Bartholomew Crews E, MD   400 mg at 01/16/22 1935  ? magnesium hydroxide (MILK OF MAGNESIA) suspension 30 mL  30 mL Oral Daily PRN Nira Conn A, NP   30 mL at 01/16/22 1654  ? multivitamin with minerals tablet 1 tablet  1 tablet Oral Daily Karsten Ro, MD   1 tablet at 01/20/22 0830  ? nicotine polacrilex (NICORETTE) gum 2 mg  2 mg Oral PRN Jackelyn Poling, NP   2 mg at 01/16/22 1933  ? pantoprazole (PROTONIX) EC tablet 40 mg  40 mg Oral Daily Mason Jim, Rigley Niess E, MD   40 mg at 01/20/22 0830  ? QUEtiapine (SEROQUEL) tablet 100 mg  100 mg Oral QHS Karsten Ro, MD   100 mg at 01/19/22  2113  ? thiamine tablet 100 mg  100 mg Oral Daily Karsten Ro, MD   100 mg at 01/20/22 0830  ? ? ?Lab Results:  ?Results for orders placed or performed during the hospital encounter of 01/16/22 (from the past 48 hour(s))  ?Hepatic function panel     Status: Abnormal  ? Collection Time: 01/19/22  6:45 AM  ?Result Value Ref Range  ? Total Protein 7.5 6.5 - 8.1 g/dL  ? Albumin 3.8 3.5 - 5.0 g/dL  ? AST 318 (H) 15 - 41 U/L  ? ALT 847 (H) 0 - 44 U/L  ? Alkaline Phosphatase 98 38 - 126 U/L  ?  Total Bilirubin 0.8 0.3 - 1.2 mg/dL  ? Bilirubin, Direct 0.1 0.0 - 0.2 mg/dL  ? Indirect Bilirubin 0.7 0.3 - 0.9 mg/dL  ?  Comment: Performed at [facility], [address] [place], [place] [address]  ? ? ?Blood Alcohol level:  ?Lab Results  ?Component Value Date  ? ETH <10 01/15/2022  ? ETH <10 01/07/2022  ? ? ?Metabolic Disorder Labs: ?Lab Results  ?Component Value Date  ? HGBA1C 5.1 11/21/2021  ? MPG 99.67 11/21/2021  ? MPG 96.8 02/22/2019  ? ?Lab Results  ?Component Value Date  ? PROLACTIN 4.5 11/21/2021  ? ?Lab Results  ?Component Value Date  ? CHOL 154 11/21/2021  ? TRIG 68 11/21/2021  ? HDL 74 11/21/2021  ? CHOLHDL 2.1 11/21/2021  ? VLDL 14 11/21/2021  ? LDLCALC 66 11/21/2021  ? LDLCALC 66 02/22/2019  ? ? ?Physical Findings: ?AIMS: Facial and Oral Movements ?Muscles of Facial Expression: None, normal ?Lips and Perioral Area: None, normal ?Jaw: None, normal ?Tongue: None, normal,Extremity Movements ?Upper (arms, wrists, hands, fingers): None, normal ?Lower (legs, knees, ankles, toes): None, normal, Trunk Movements ?Neck, shoulders, hips: None, normal, Overall Severity ?Severity of abnormal movements (highest score from questions above): None, normal ?Incapacitation due to abnormal movements: None, normal ?Patient's awareness of abnormal movements (rate only patient's report): No Awareness, Dental Status ?Current problems with teeth and/or dentures?: No ?Does patient usually wear dentures?: No  ?CIWA:   CIWA-Ar Total: 1 ?COWS:  COWS Total Score: 2 ? ?Musculoskeletal: ?Strength & Muscle Tone: within normal limits ?Gait & Station: normal ?Patient leans: N/A ? ?Psychiatric Specialty Exam: ? ?Presentation  ?[facility]

## 2022-01-20 NOTE — Progress Notes (Signed)
?   01/20/22 2233  ?Psych Admission Type (Psych Patients Only)  ?Admission Status Voluntary  ?Psychosocial Assessment  ?Patient Complaints Anxiety  ?Eye Contact Fair  ?Facial Expression Flat  ?Affect Appropriate to circumstance  ?Speech Logical/coherent  ?Interaction Guarded;Minimal  ?Motor Activity Other (Comment) ?(WDL)  ?Appearance/Hygiene Unremarkable  ?Behavior Characteristics Appropriate to situation  ?Mood Anxious;Irritable  ?Thought Process  ?Coherency WDL  ?Content WDL  ?Delusions None reported or observed  ?Perception WDL  ?Hallucination None reported or observed  ?Judgment Impaired  ?Confusion None  ?Danger to Self  ?Current suicidal ideation? Denies  ?Agreement Not to Harm Self Yes  ?Description of Agreement verbal  ?Danger to Others  ?Danger to Others None reported or observed  ? ? ?

## 2022-01-20 NOTE — Progress Notes (Signed)
Pt c/o anxiety r/t wanting to leave the facility and feeling disrespected when the telephone was disconnected by staff while he was talking. Pt states a relative has agreed to allow him to reside with them. Writer encouraged Pt to inform SW of relative's name and telephone number. Writer provided PRN for anxiety.  ?

## 2022-01-20 NOTE — Progress Notes (Signed)
Pt is A&OX4, mild anxiety, denies suicidal ideations, denies homicidal ideations, denies auditory hallucinations and denies visual hallucinations. Pt verbally agrees to approach staff if these become apparent and before harming self or others. Pt denies experiencing nightmares. Mood and affect are congruent. Pt appetite is ok. Pt's memory appears to be grossly intact, and Pt hasn?t displayed any injurious behaviors. Pt is medication compliant. There?s no evidence of suicidal intent. Psychomotor activity was WNL. No s/s of Parkinson, Dystonia, Akathisia and/or Tardive Dyskinesia noted.   ?

## 2022-01-21 ENCOUNTER — Telehealth: Payer: Self-pay | Admitting: Nurse Practitioner

## 2022-01-21 ENCOUNTER — Encounter (HOSPITAL_COMMUNITY): Payer: Self-pay

## 2022-01-21 DIAGNOSIS — B1710 Acute hepatitis C without hepatic coma: Secondary | ICD-10-CM

## 2022-01-21 DIAGNOSIS — R7401 Elevation of levels of liver transaminase levels: Secondary | ICD-10-CM

## 2022-01-21 LAB — PROTIME-INR
INR: 1 (ref 0.8–1.2)
Prothrombin Time: 12.6 seconds (ref 11.4–15.2)

## 2022-01-21 LAB — HEPATIC FUNCTION PANEL
ALT: 947 U/L — ABNORMAL HIGH (ref 0–44)
AST: 371 U/L — ABNORMAL HIGH (ref 15–41)
Albumin: 3.7 g/dL (ref 3.5–5.0)
Alkaline Phosphatase: 91 U/L (ref 38–126)
Bilirubin, Direct: 0.1 mg/dL (ref 0.0–0.2)
Indirect Bilirubin: 0.7 mg/dL (ref 0.3–0.9)
Total Bilirubin: 0.8 mg/dL (ref 0.3–1.2)
Total Protein: 7.4 g/dL (ref 6.5–8.1)

## 2022-01-21 NOTE — Progress Notes (Signed)
Following up elevated transaminases secondary to acute hepatitis C. ? ?AST 371, ALT 947; modest increase, overall stable ?AP, T bili and PT/INR WNL ? ?Recommendation: Continue inpatient psychiatric treatment, monitor for signs of acute decompensation (confusion, abdominal pain, fever, jaundice, etc), and follow-up with Infectious Disease as an outpatient. ? ?Will continue to follow remotely. ? ?Brendia Sacks, MD ?[facility] ?Contact via Secure Chat or see Amion. ? ?No bill. ?

## 2022-01-21 NOTE — Progress Notes (Signed)
D:  Patient denied SI and HI, contracts for safety.  Denied A/V hallucinations.   Patient stated he slept 7 hrs last night.  Ready to discharge today.   ?A:  Medications administered per MD orders.  Emotional support and encouragement given patient. ?R:  Safety maintained with 15 minute checks. ? ?

## 2022-01-21 NOTE — Plan of Care (Signed)
Nurse discussed anxiety, depression and coping skills with patient.  

## 2022-01-21 NOTE — Group Note (Signed)
LCSW Group Therapy ? ?Type of Therapy and Topic:  Group Therapy: Thoughts, Feelings, and Actions ? ?Participation Level:  None ? ? ?Description of Group:   ?In this group, each patient discussed their previous experiencing and understanding of overthinking, identifying the harmful impact on their lives. As a group, each patient was introduced to the basic concepts of Cognitive Behavioral Therapy: that thoughts, feelings, and actions are all connected and influence one another. They were given examples of how overthinking can affect our feelings, actions, and vise versa. The group was then asked to analyze how overthinking was harmful and brainstorm alternative thinking patterns/reactions to the example situation. Then, each group member filled out and identified their own example situation in which a problem situation caused their thoughts, feelings, and actions to be negatively impacted; they were asked to come up with 3 new (more adaptive/positive) thoughts that led to 3 new feelings and actions. ? ?Therapeutic Goals: ?Patients will review and discuss their past experience with overthinking. ?Patients will learn the basics of the CBT model through group-led examples.Marland Kitchen ?Patients will identify situations where they may have negative thoughts, feelings, or actions and will then reframe the situation using more positive thoughts to react differently. ? ?Summary of Patient Progress:  Did not attend ? ? ?Therapeutic Modalities:   ?Cognitive Behavioral Therapy ?Mindfulness ? ?Sakeena Teall E Ariele Vidrio, LCSW ?01/21/2022  2:07 PM   ? ?

## 2022-01-21 NOTE — BH IP Treatment Plan (Signed)
Interdisciplinary Treatment and Diagnostic Plan Update ? ?01/21/2022 ?Time of Session: 9:30am ?Tristan [name] ?MRN: [id number] ? ?Principal Diagnosis: Bipolar affective disorder, depressed, severe, with psychotic behavior (HCC) ? ?Secondary Diagnoses: Principal Problem: ?  Bipolar affective disorder, depressed, severe, with psychotic behavior (HCC) ?Active Problems: ?  Polysubstance abuse (HCC) ?  Transaminitis ?  Acute hepatitis C virus infection without hepatic coma ? ? ?Current Medications:  ?Current Facility-Administered Medications  ?Medication Dose Route Frequency Provider Last Rate Last Admin  ? alum & mag hydroxide-simeth (MAALOX/MYLANTA) 200-200-20 MG/5ML suspension 30 mL  30 mL Oral Q4H PRN Nira Conn A, NP      ? escitalopram (LEXAPRO) tablet 10 mg  10 mg Oral Daily Karsten Ro, MD   10 mg at 01/21/22 0809  ? gabapentin (NEURONTIN) capsule 100 mg  100 mg Oral TID Comer Locket, MD   100 mg at 01/21/22 8032  ? hydrOXYzine (ATARAX) tablet 25 mg  25 mg Oral TID PRN Nira Conn A, NP   25 mg at 01/21/22 1224  ? ibuprofen (ADVIL) tablet 400 mg  400 mg Oral Q6H PRN Bartholomew Crews E, MD   400 mg at 01/20/22 2124  ? magnesium hydroxide (MILK OF MAGNESIA) suspension 30 mL  30 mL Oral Daily PRN Nira Conn A, NP   30 mL at 01/16/22 1654  ? multivitamin with minerals tablet 1 tablet  1 tablet Oral Daily Karsten Ro, MD   1 tablet at 01/21/22 0809  ? nicotine polacrilex (NICORETTE) gum 2 mg  2 mg Oral PRN Jackelyn Poling, NP   2 mg at 01/16/22 1933  ? pantoprazole (PROTONIX) EC tablet 40 mg  40 mg Oral Daily Comer Locket, MD   40 mg at 01/21/22 0809  ? QUEtiapine (SEROQUEL) tablet 100 mg  100 mg Oral QHS Karsten Ro, MD   100 mg at 01/20/22 2122  ? thiamine tablet 100 mg  100 mg Oral Daily Karsten Ro, MD   100 mg at 01/21/22 0810  ? ?PTA Medications: ?Medications Prior to Admission  ?Medication Sig Dispense Refill Last Dose  ? FLUoxetine (PROZAC) 20 MG capsule Take 1 capsule (20 mg total)  by mouth daily. 30 capsule 1   ? hydrOXYzine (ATARAX) 25 MG tablet Take 1 tablet (25 mg total) by mouth every 6 (six) hours as needed for anxiety. 30 tablet 1   ? ? ?Patient Stressors: Financial difficulties   ?Medication change or noncompliance   ?Substance abuse   ?Traumatic event   ? ?Patient Strengths: General fund of knowledge  ?Physical Health  ?Supportive family/friends  ? ?Treatment Modalities: Medication Management, Group therapy, Case management,  ?1 to 1 session with clinician, Psychoeducation, Recreational therapy. ? ? ?Physician Treatment Plan for Primary Diagnosis: Bipolar affective disorder, depressed, severe, with psychotic behavior (HCC) ?Long Term Goal(s):    ? ?Short Term Goals:   ? ?Medication Management: Evaluate patient's response, side effects, and tolerance of medication regimen. ? ?Therapeutic Interventions: 1 to 1 sessions, Unit Group sessions and Medication administration. ? ?Evaluation of Outcomes: Progressing ? ?Physician Treatment Plan for Secondary Diagnosis: Principal Problem: ?  Bipolar affective disorder, depressed, severe, with psychotic behavior (HCC) ?Active Problems: ?  Polysubstance abuse (HCC) ?  Transaminitis ?  Acute hepatitis C virus infection without hepatic coma ? ?Long Term Goal(s):    ? ?Short Term Goals:      ? ?Medication Management: Evaluate patient's response, side effects, and tolerance of medication regimen. ? ?Therapeutic Interventions: 1 to 1 sessions, Unit Group  sessions and Medication administration. ? ?Evaluation of Outcomes: Progressing ? ? ?RN Treatment Plan for Primary Diagnosis: Bipolar affective disorder, depressed, severe, with psychotic behavior (HCC) ?Long Term Goal(s): Knowledge of disease and therapeutic regimen to maintain health will improve ? ?Short Term Goals: Ability to remain free from injury will improve, Ability to verbalize frustration and anger appropriately will improve, Ability to demonstrate self-control, Ability to participate in  decision making will improve, Ability to identify and develop effective coping behaviors will improve, and Compliance with prescribed medications will improve ? ?Medication Management: RN will administer medications as ordered by provider, will assess and evaluate patient's response and provide education to patient for prescribed medication. RN will report any adverse and/or side effects to prescribing provider. ? ?Therapeutic Interventions: 1 on 1 counseling sessions, Psychoeducation, Medication administration, Evaluate responses to treatment, Monitor vital signs and CBGs as ordered, Perform/monitor CIWA, COWS, AIMS and Fall Risk screenings as ordered, Perform wound care treatments as ordered. ? ?Evaluation of Outcomes: Progressing ? ? ?LCSW Treatment Plan for Primary Diagnosis: Bipolar affective disorder, depressed, severe, with psychotic behavior (HCC) ?Long Term Goal(s): Safe transition to appropriate next level of care at discharge, Engage patient in therapeutic group addressing interpersonal concerns. ? ?Short Term Goals: Engage patient in aftercare planning with referrals and resources, Increase social support, Facilitate acceptance of mental health diagnosis and concerns, Facilitate patient progression through stages of change regarding substance use diagnoses and concerns, Identify triggers associated with mental health/substance abuse issues, and Increase skills for wellness and recovery ? ?Therapeutic Interventions: Assess for all discharge needs, 1 to 1 time with Child psychotherapist, Explore available resources and support systems, Assess for adequacy in community support network, Educate family and significant other(s) on suicide prevention, Complete Psychosocial Assessment, Interpersonal group therapy. ? ?Evaluation of Outcomes: Progressing ? ? ?Progress in Treatment: ?Attending groups: Yes. ?Participating in groups: Yes. ?Taking medication as prescribed: Yes. ?Toleration medication: Yes. ?Family/Significant  other contact made: Yes, individual(s) contacted:  mother ?Patient understands diagnosis: Yes. ?Discussing patient identified problems/goals with staff: Yes. ?Medical problems stabilized or resolved: Yes. ?Denies suicidal/homicidal ideation: Yes. ?Issues/concerns per patient self-inventory: No. ? ? ?New problem(s) identified: No, Describe:  none ? ?New Short Term/Long Term Goal(s): detox, medication management for mood stabilization; elimination of SI thoughts; development of comprehensive mental wellness/sobriety plan ? ?Patient Goals:  "To learn coping skills and to better manage my depression"  ? ?Discharge Plan or Barriers: Patient will follow up at [facility] for counseling and medication management  ? ?Reason for Continuation of Hospitalization: Depression ?Medical Issues ?Medication stabilization ? ?Estimated Length of Stay: 3-5 days ? ?  ? ? ?Last 3 [place] Suicide Severity Risk Score: ?Flowsheet Row [facility] (Current[facility]14[facility]2022 [facility] from 01/07/2022 in [facility]  ?C-SSRS RISK CATEGORY Moderate Risk Moderate Risk Moderate Risk  ? ?  ? ? ?Last PHQ 2/9 Scores: ? ?  01/09/2022  ?  8:53 AM 01/08/2022  ? 11:57 AM 11/21/2021  ?  4:22 PM  ?Depression screen PHQ 2/9  ?Decreased Interest 1 1 2   ?Down, Depressed, Hopeless 1 2 2   ?PHQ - 2 Score 2 3 4   ?Altered sleeping 1 1 2   ?Tired, decreased energy 1 1 0  ?Change in appetite 1 2 2   ?Feeling bad or failure about yourself  1 3 2   ?Trouble concentrating 1 2 2   ?Moving slowly or fidgety/restless 1 3 2   ?Suicidal  thoughts 1 2 2   ?PHQ-9 Score 9 17 16   ?Difficult doing work/chores  Very difficult Very difficult  ? ? ?Scribe for Treatment Team: ?Otelia SanteeMeredith A Rahkim Rabalais, LCSW ?01/21/2022 ?9:54 AM ? ? ?

## 2022-01-21 NOTE — Progress Notes (Signed)
[facility] MD Progress Note ? ?01/21/2022 1:11 PM ?Tristan [name]  ?MRN:  [id number] ? ?Chief Complaint: SI ? ?Reason for Admission:  ?Rayland Hamed is a 36 y.o. male with a history of bipolar d/o, SIMD, and polysubstance abuse, who was initially admitted for inpatient psychiatric hospitalization on 01/16/2022 for management of altered mental status, SI and AVH in the context of polysubstance abuse. The patient is currently on Hospital Day 5.  ? ?Chart Review from last 24 hours:  ?The patient's chart was reviewed and nursing notes were reviewed. The patient's case was discussed in multidisciplinary team meeting.  Per MAR, he was compliant with scheduled medications and did receive Vistaril X2  for anxiety and Advil x1 for pain. ? ?Information Obtained Today During Patient Interview: ?The patient was seen and evaluated on the unit. He looks anxious in regards to his ongoing health issues.  He states that he wants to go home today and will follow-up with outpatient providers for hep C.  Discussed that we are working with ID and hospitalist to see if he needs to be admitted to medical unit.  He reports that yesterday he had some abdominal pain but denies any pain this morning. He denies nausea, vomiting and diarrhea.  Discussed that we will also call his mom to see if he can go there at discharge.  Patient states that she may let him stay with her.  Discussed that some of his autoimmune tests also came back positive for which he need to follow-up with outpatient provider.  He reports headache but denies any chest pain, shortness of breath, and dizziness.  He reports that his anxiety is getting worse and he has been thinking about his ongoing health issues.  Discussed that we do not want to add too many medications because of high liver enzymes.  Encouraged him to use his coping skills.  He is irritable, states he wants to go home soon.He denies SI, HI, AVH, paranoia, ideas of reference or first rank  symptoms.  He reports that he slept well last night and reports stable appetite.  He voices no other physical complaints and denies medication side effects. ?Later today, discussed that per infectious disease, patient does not need inpatient hospitalization to medical unit and can follow up with outpatient providers.  Discussed that CSW working to get him outpatient follow-up appointments with infectious disease and primary care provider for treatment of his Hep C.  ?Tried calling mom Alona Bene @[phone] x [phone] -no answer  ?Principal Problem: Bipolar affective disorder, depressed, severe, with psychotic behavior (HCC) ?Diagnosis: Principal Problem: ?  Bipolar affective disorder, depressed, severe, with psychotic behavior (HCC) ?Active Problems: ?  Polysubstance abuse (HCC) ?  Transaminitis ?  Acute hepatitis C virus infection without hepatic coma ? ?Total Time Spent in Direct Patient Care:  ?I personally spent 25 minutes on the unit in direct patient care. The direct patient care time included face-to-face time with the patient, reviewing the patient's chart, communicating with other professionals, and coordinating care. Greater than 50% of this time was spent in counseling or coordinating care with the patient regarding goals of hospitalization, psycho-education, and discharge planning needs. ? ?Past Psychiatric History: see H&P ? ?Past Medical History:  ?Past Medical History:  ?Diagnosis Date  ? Bipolar 1 disorder (HCC)   ? Hypertension   ? PTSD (post-traumatic stress disorder)   ?  ?Past Surgical History:  ?Procedure Laterality Date  ? NO PAST SURGERIES    ? ?Family History:  ?Family History  ?  Problem Relation Age of Onset  ? Other Father   ? Psychiatric Illness Father   ? ?Family Psychiatric  History: see H&P ? ?Social History:  ?Social History  ? ?Substance and Sexual Activity  ?Alcohol Use Yes  ? Comment: Pint per day  ?   ?Social History  ? ?Substance and Sexual Activity  ?Drug Use Yes  ? Types: Marijuana,  Heroin, Cocaine  ? Comment: denies drug use  ?  ?Social History  ? ?Socioeconomic History  ? Marital status: Single  ?  Spouse name: Not on file  ? Number of children: Not on file  ? Years of education: Not on file  ? Highest education level: Not on file  ?Occupational History  ? Not on file  ?Tobacco Use  ? Smoking status: Every Day  ?  Packs/day: 0.50  ?  Types: Cigarettes  ? Smokeless tobacco: Never  ?Vaping Use  ? Vaping Use: Never used  ?Substance and Sexual Activity  ? Alcohol use: Yes  ?  Comment: Pint per day  ? Drug use: Yes  ?  Types: Marijuana, Heroin, Cocaine  ?  Comment: denies drug use  ? Sexual activity: Not Currently  ?Other Topics Concern  ? Not on file  ?Social History Narrative  ? ** Merged History Encounter **  ?    ? ?Social Determinants of Health  ? ?Financial Resource Strain: Not on file  ?Food Insecurity: Not on file  ?Transportation Needs: Not on file  ?Physical Activity: Not on file  ?Stress: Not on file  ?Social Connections: Not on file  ? ?Sleep: Good ? ?Appetite:  Good ? ?Current Medications: ?Current Facility-Administered Medications  ?Medication Dose Route Frequency Provider Last Rate Last Admin  ? alum & mag hydroxide-simeth (MAALOX/MYLANTA) 200-200-20 MG/5ML suspension 30 mL  30 mL Oral Q4H PRN Nira ConnBerry, Jason A, NP      ? escitalopram (LEXAPRO) tablet 10 mg  10 mg Oral Daily Karsten Rooda, Cobi Delph, MD   10 mg at 01/21/22 0809  ? gabapentin (NEURONTIN) capsule 100 mg  100 mg Oral TID Comer LocketSingleton, Amy E, MD   100 mg at 01/21/22 1206  ? hydrOXYzine (ATARAX) tablet 25 mg  25 mg Oral TID PRN Nira ConnBerry, Jason A, NP   25 mg at 01/21/22 16100811  ? ibuprofen (ADVIL) tablet 400 mg  400 mg Oral Q6H PRN Bartholomew CrewsSingleton, Amy E, MD   400 mg at 01/20/22 2124  ? magnesium hydroxide (MILK OF MAGNESIA) suspension 30 mL  30 mL Oral Daily PRN Nira ConnBerry, Jason A, NP   30 mL at 01/16/22 1654  ? multivitamin with minerals tablet 1 tablet  1 tablet Oral Daily Karsten Rooda, Christofer Shen, MD   1 tablet at 01/21/22 0809  ? nicotine polacrilex  (NICORETTE) gum 2 mg  2 mg Oral PRN Jackelyn PolingBerry, Jason A, NP   2 mg at 01/21/22 1021  ? pantoprazole (PROTONIX) EC tablet 40 mg  40 mg Oral Daily Comer LocketSingleton, Amy E, MD   40 mg at 01/21/22 0809  ? QUEtiapine (SEROQUEL) tablet 100 mg  100 mg Oral QHS Karsten Rooda, Micaylah Bertucci, MD   100 mg at 01/20/22 2122  ? thiamine tablet 100 mg  100 mg Oral Daily Karsten Rooda, Lamyra Malcolm, MD   100 mg at 01/21/22 0810  ? ? ?Lab Results:  ?Results for orders placed or performed during the hospital encounter of 01/16/22 (from the past 48 hour(s))  ?Hepatic function panel     Status: Abnormal  ? Collection Time: 01/21/22  6:21 AM  ?Result Value  Ref Range  ? Total Protein 7.4 6.5 - 8.1 g/dL  ? Albumin 3.7 3.5 - 5.0 g/dL  ? AST 371 (H) 15 - 41 U/L  ? ALT 947 (H) 0 - 44 U/L  ? Alkaline Phosphatase 91 38 - 126 U/L  ? Total Bilirubin 0.8 0.3 - 1.2 mg/dL  ? Bilirubin, Direct 0.1 0.0 - 0.2 mg/dL  ? Indirect Bilirubin 0.7 0.3 - 0.9 mg/dL  ?  Comment: Performed at [facility], [address], [place], [place] [address]  ?Protime-INR     Status: None  ? Collection Time: 01/21/22  6:21 AM  ?Result Value Ref Range  ? Prothrombin Time 12.6 11.4 - 15.2 seconds  ? INR 1.0 0.8 - 1.2  ?  Comment: (NOTE) ?INR goal varies based on device and disease states. ?Performed at [facility], [address] ?[place], [place] [address] ?  ? ? ?Blood Alcohol level:  ?Lab Results  ?Component Value Date  ? ETH <10 01/15/2022  ? ETH <10 01/07/2022  ? ? ?Metabolic Disorder Labs: ?Lab Results  ?Component Value Date  ? HGBA1C 5.1 11/21/2021  ? MPG 99.67 11/21/2021  ? MPG 96.8 02/22/2019  ? ?Lab Results  ?Component Value Date  ? PROLACTIN 4.5 11/21/2021  ? ?Lab Results  ?Component Value Date  ? CHOL 154 11/21/2021  ? TRIG 68 11/21/2021  ? HDL 74 11/21/2021  ? CHOLHDL 2.1 11/21/2021  ? VLDL 14 11/21/2021  ? LDLCALC 66 11/21/2021  ? LDLCALC 66 02/22/2019  ? ? ?Physical Findings: ?AIMS: Facial and Oral Movements ?Muscles of Facial Expression: None, normal ?Lips  and Perioral Area: None, normal ?Jaw: None, normal ?Tongue: None, normal,Extremity Movements ?Upper (arms, wrists, hands, fingers): None, normal ?Lower (legs, knees, ankles, toes): None, normal, Trunk Movements ?Neck, shoul

## 2022-01-21 NOTE — BHH Group Notes (Signed)
Spiritual care group on grief and loss facilitated by chaplain Katy Leeann Bady, BCC   Group Goal:   Support / Education around grief and loss   Members engage in facilitated group support and psycho-social education.   Group Description:   Following introductions and group rules, group members engaged in facilitated group dialog and support around topic of loss, with particular support around experiences of loss in their lives. Group Identified types of loss (relationships / self / things) and identified patterns, circumstances, and changes that precipitate losses. Reflected on thoughts / feelings around loss, normalized grief responses, and recognized variety in grief experience. Group noted Worden's four tasks of grief in discussion.   Group drew on Adlerian / Rogerian, narrative, MI,   Patient Progress: Did not attend.  

## 2022-01-21 NOTE — Group Note (Signed)
Recreation Therapy Group Note ? ? ?Group Topic:Stress Management  ?Group Date: 01/21/2022 ?Start Time: 0930 ?End Time: 1000 ?Facilitators: Caroll Rancher, LRT,CTRS ?Location: [address] Dayroom ? ? ?Goal Area(s) Addresses:  ?Patient will actively participate in stress management techniques presented during session.  ?Patient will successfully identify benefit of practicing stress management post d/c.  ? ?Group Description: Guided Imagery. LRT provided education, instruction, and demonstration on practice of visualization via guided imagery. Patient was asked to participate in the technique introduced during session. LRT debriefed including topics of mindfulness, stress management and specific scenarios each patient could use these techniques. Patients were given suggestions of ways to access scripts post d/c and encouraged to explore Youtube and other apps available on smartphones, tablets, and computers. ? ? ?Affect/Mood: N/A ?  ?Participation Level: N/A ?  ? ?Clinical Observations/Individualized Feedback:  Group did not occur due to orientation group starting extremely late, running over and using up group time.  ? ? ?Plan: Continue to engage patient in RT group sessions 2-3x/week. ? ? ?Caroll Rancher, LRT,CTRS ?01/21/2022 12:35 PM ?

## 2022-01-21 NOTE — Group Note (Signed)
Occupational Therapy Group Note ? ?Group Topic:Other  ?Group Date: 01/21/2022 ?Start Time: 1400 ?End Time: 1500 ?Facilitators: Brantley Stage, OT  ? ? ?Routines are sets of habitual activities or practices that we engage in on a regular basis. They can be as simple as a morning cup of coffee or as complex as a daily workout routine. In this essay, we will explore the power of routines in promoting mental health and wellbeing, the reasons why they are important, and how to identify areas of our lives where routines can help improve our overall mental health. Wellbeing The power of routines lies in their ability to provide structure, stability, and predictability in our lives. They help [place] to establish healthy habits and reduce stress and anxiety by minimizing decision fatigue. They also help [place] to manage our time effectively and achieve our goals, which can boost our sense of self-efficacy and confidence. Routines can also foster a sense of community and social connectedness by providing opportunities to engage in shared activities with others. , routines can be a powerful tool in promoting mental health and wellbeing. By providing structure, stability, and predictability in our lives, routines can help [place] to establish healthy habits, reduce stress and anxiety, manage our time effectively, achieve our goals, and foster a sense of community and social connectedness. By identifying areas of our lives where routines can improve our mental health and wellbeing, and by following tips for establishing and maintaining healthy routines, we can harness the power of routines to improve our overall quality of life. ? ? ? ? ?Participation Level: Minimal ?  ?Participation Quality: Minimal Cues ?  ?Behavior: Appropriate and Cooperative ?  ?Speech/Thought Process: Focused ?  ?Affect/Mood: Appropriate ?  ?Insight: Good ?  ?Judgement: Good ?  ?Individualization: Pt was engaged in their participation of group discussion/activity. New  routines were identified  ?Modes of Intervention: Discussion and Education  ?Patient Response to Interventions:  Attentive ?  ?Plan: Continue to engage patient in OT groups 2 - 3x/week. ? ?01/21/2022  ?Brantley Stage, OT ? ?Cornell Barman, OT ? ? ? ? ?

## 2022-01-21 NOTE — Telephone Encounter (Signed)
Copied from CRM [id number]. Topic: Appointment Scheduling - Scheduling Inquiry for Clinic ?>> Jan 21, 2022 11:59 AM Randol Kern wrote: ?Reason for CRM: Haywood Lasso from [facility] called requesting for the patient to have lab work during his appt Wednesday May 3rd. Please advise, the appt should be a hospital follow up but she requested for it to be a new patient appt since it was available at the exact time she needed.  ?Best contact: [phone] ?

## 2022-01-21 NOTE — Progress Notes (Signed)
I am dead serious about leaving today.  Waiting to be discharged today. ? ?

## 2022-01-21 NOTE — BHH Group Notes (Signed)
The focus of this group is to help patients review their daily goal of treatment and discuss progress on daily workbooks. ? ?Pt was not present for tonight's wrap up group with AA.  ?

## 2022-01-21 NOTE — Plan of Care (Cosign Needed)
Plan of care Note- ? ?Epic message sent to Dr Linus Salmons to order and follow labs for this Pt after discharge. He asked to send referral to ID and Pt will get a call from their referral coordinator. ? ?Dr Armando Reichert ?PGY2 Psychiatry Resident ? ?

## 2022-01-22 DIAGNOSIS — F315 Bipolar disorder, current episode depressed, severe, with psychotic features: Principal | ICD-10-CM

## 2022-01-22 MED ORDER — THIAMINE HCL 100 MG PO TABS: 100.0000 mg | ORAL_TABLET | Freq: Every day | ORAL | 0 refills | Status: DC

## 2022-01-22 MED ORDER — PANTOPRAZOLE SODIUM 40 MG PO TBEC: 40.0000 mg | DELAYED_RELEASE_TABLET | Freq: Every day | ORAL | 0 refills | Status: DC

## 2022-01-22 MED ORDER — QUETIAPINE FUMARATE 100 MG PO TABS: 100.0000 mg | ORAL_TABLET | Freq: Every day | ORAL | 0 refills | Status: DC

## 2022-01-22 MED ORDER — GABAPENTIN 100 MG PO CAPS: 100.0000 mg | ORAL_CAPSULE | Freq: Three times a day (TID) | ORAL | 0 refills | Status: DC

## 2022-01-22 MED ORDER — HYDROXYZINE HCL 25 MG PO TABS: 25.0000 mg | ORAL_TABLET | Freq: Three times a day (TID) | ORAL | 0 refills | Status: DC | PRN

## 2022-01-22 MED ORDER — ADULT MULTIVITAMIN W/MINERALS CH: 1.0000 | ORAL_TABLET | Freq: Every day | ORAL | 0 refills | Status: DC

## 2022-01-22 MED ORDER — NICOTINE POLACRILEX 2 MG MT GUM: 2.0000 mg | CHEWING_GUM | OROMUCOSAL | 0 refills | Status: DC | PRN

## 2022-01-22 MED ORDER — ESCITALOPRAM OXALATE 10 MG PO TABS: 10.0000 mg | ORAL_TABLET | Freq: Every day | ORAL | 0 refills | Status: DC

## 2022-01-22 NOTE — Progress Notes (Signed)
D:  Patient's self inventory sheet, patient sleeps good, no sleep medication.  Good appetite, normal energy level, good concentration.  Rated depression 3, hopeless 2, anxiety 7.  Denied withdrawals.  Denied SI.  Denied physical problems.  Denied physical pain.  Goal is  maintain sobriety and recovery.  Plans to attend NA and seek other resources. Has developed his own discharge plans. ?A:  Medications administered per  MD orders.  Emotional support  and encouragement given patient. ?R:  Safety maintained with 15 minute checks. ? ?
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
## 2022-01-22 NOTE — Plan of Care (Signed)
Nurse discussed coping skills with patient.  

## 2022-01-22 NOTE — Progress Notes (Signed)
?  [facility] Adult Case Management Discharge Plan : ? ?Will you be returning to the same living situation after discharge:  No. Will be staying with mother ?At discharge, do you have transportation home?: No. Taxi will be arranged ?Do you have the ability to pay for your medications: No. Samples will be provided ? ?Release of information consent forms completed and in the chart;  Patient's signature needed at discharge. ? ?Patient to Follow up at: ? Follow-up Information   ? ? [place]. Go to.   ?Specialty: Behavioral Health ?Why: Please go to this provider for therapy and medication management services on walk in days:  Mondays and Wednesdays, arrive at 7:30 am.  Services are provided on a first come, first served basis. ?Contact information: ?[address] ?[place] ?[phone] ? ?  ?  ? ? [place]. Go on 01/23/2022.   ?Why: You have an appointment on 01/23/22 at 1:30 pm with Geryl Rankins, MD.  Dennis Bast will need to have labwork completed at this appointment. ?Contact information: ?[place] ?[place] [phone] ?[phone] ? ?  ?  ? ? [facility] Follow up.   ?Why: Referral made ?Contact information: ?[place] ?[place] [address] ?[phone] ? ? ?  ?  ? ? [facility]              Follow up.   ?Why: You have an appointment with infectious disease on Thursday, 01/24/2022 at  2:30pm. ?Contact information: ?[address] ?[place] [phone] ? ?  ?  ? ?  ?  ? ?  ? ? ?Next level of care provider has access to [place] ? ?Safety Planning and Suicide Prevention discussed: Yes,  with mother ? ?  ? ?Has patient been referred to the Quitline?: Patient refused referral ? ?Patient has been referred for addiction treatment: Yes ? ?Vassie Moselle, LCSW ?01/22/2022, 11:07 AM ?

## 2022-01-22 NOTE — [facility] Group Notes (Signed)
Adult Psychoeducational Group Note ? ?Date:  01/22/2022 ?Time:  10:05 AM ? ?Group Topic/Focus:  ?Orientation:   The focus of this group is to educate the patient on the purpose and policies of crisis stabilization and provide a format to answer questions about their admission.  The group details unit policies and expectations of patients while admitted. ? ?Participation Level:  Active ? ?Participation Quality:  Appropriate ? ?Affect:  Appropriate ? ?Cognitive:  Appropriate ? ?Insight: Appropriate ? ?Engagement in Group:  Engaged ? ?Modes of Intervention:  Education ? ?Additional Comments:  Pt participated in group.  ? ?Tristan [name] ?01/22/2022, 10:05 AM ?

## 2022-01-22 NOTE — Telephone Encounter (Signed)
Noted-  ?Will schedule as HFU ?

## 2022-01-22 NOTE — Progress Notes (Signed)
?   01/21/22 2120  ?Psych Admission Type (Psych Patients Only)  ?Admission Status Voluntary  ?Psychosocial Assessment  ?Patient Complaints Anxiety  ?Eye Contact Fair  ?Facial Expression Flat  ?Affect Appropriate to circumstance  ?Speech Logical/coherent  ?Interaction Forwards little  ?Motor Activity Other (Comment) ?(WDL)  ?Appearance/Hygiene Unremarkable  ?Behavior Characteristics Appropriate to situation  ?Mood Anxious  ?Thought Process  ?Coherency WDL  ?Content WDL  ?Delusions None reported or observed  ?Perception WDL  ?Hallucination None reported or observed  ?Judgment Poor  ?Confusion None  ?Danger to Self  ?Current suicidal ideation? Denies  ?Danger to Others  ?Danger to Others None reported or observed  ? ? ?

## 2022-01-22 NOTE — Progress Notes (Signed)
Discharge Note:  Patient discharged home  via taxi.  Suicide prevention information given and discussed with patient who stated he understood and had no questions.  Patient denied SI and HI.  Denied A/V hallucinations.  Patient stated he appreciated all assistance received from [facility] staff.  All required discharge information given. ? ?

## 2022-01-23 ENCOUNTER — Ambulatory Visit: Payer: Self-pay | Admitting: Nurse Practitioner

## 2022-01-23 ENCOUNTER — Telehealth: Payer: Self-pay

## 2022-01-23 ENCOUNTER — Other Ambulatory Visit (HOSPITAL_COMMUNITY): Payer: Self-pay

## 2022-01-23 NOTE — Telephone Encounter (Signed)
[facility] Patient Advocate Encounter ? ?Insurance verification completed.   ? ?The patient is insured through [facility] and has a $4.00 copay. ? ?Medication will need a PA. ? ?We will continue to follow to see if copay assistance is needed. ? ?Clearance Coots, CPhT ?Specialty Pharmacy Patient Advocate ?[facility] ?Phone: [phone] ?Fax:  [phone]  ?

## 2022-01-24 ENCOUNTER — Encounter: Payer: Self-pay | Admitting: Infectious Diseases

## 2022-01-26 ENCOUNTER — Encounter (HOSPITAL_COMMUNITY): Payer: Self-pay | Admitting: Emergency Medicine

## 2022-01-26 ENCOUNTER — Emergency Department (HOSPITAL_COMMUNITY)
Admission: EM | Admit: 2022-01-26 | Discharge: 2022-01-26 | Disposition: A | Discharge status: Home / Self Care | Payer: PRIVATE HEALTH INSURANCE | Attending: Emergency Medicine | Admitting: Emergency Medicine

## 2022-01-26 ENCOUNTER — Other Ambulatory Visit: Payer: Self-pay

## 2022-01-26 DIAGNOSIS — F17210 Nicotine dependence, cigarettes, uncomplicated: Secondary | ICD-10-CM | POA: Insufficient documentation

## 2022-01-26 DIAGNOSIS — R451 Restlessness and agitation: Secondary | ICD-10-CM | POA: Diagnosis not present

## 2022-01-26 DIAGNOSIS — I10 Essential (primary) hypertension: Secondary | ICD-10-CM | POA: Insufficient documentation

## 2022-01-26 DIAGNOSIS — F19921 Other psychoactive substance use, unspecified with intoxication with delirium: Secondary | ICD-10-CM | POA: Diagnosis not present

## 2022-01-26 DIAGNOSIS — R4182 Altered mental status, unspecified: Secondary | ICD-10-CM | POA: Diagnosis present

## 2022-01-26 HISTORY — DX: Unspecified viral hepatitis C without hepatic coma: B19.20

## 2022-01-26 LAB — CBC WITH DIFFERENTIAL/PLATELET
Abs Immature Granulocytes: 0.03 10*3/uL (ref 0.00–0.07)
Basophils Absolute: 0 10*3/uL (ref 0.0–0.1)
Basophils Relative: 1 %
Eosinophils Absolute: 0.1 10*3/uL (ref 0.0–0.5)
Eosinophils Relative: 1 %
HCT: 32 % — ABNORMAL LOW (ref 39.0–52.0)
Hemoglobin: 11.4 g/dL — ABNORMAL LOW (ref 13.0–17.0)
Immature Granulocytes: 0 %
Lymphocytes Relative: 27 %
Lymphs Abs: 2.1 10*3/uL (ref 0.7–4.0)
MCH: 26.8 pg (ref 26.0–34.0)
MCHC: 35.6 g/dL (ref 30.0–36.0)
MCV: 75.3 fL — ABNORMAL LOW (ref 80.0–100.0)
Monocytes Absolute: 0.7 10*3/uL (ref 0.1–1.0)
Monocytes Relative: 10 %
Neutro Abs: 4.7 10*3/uL (ref 1.7–7.7)
Neutrophils Relative %: 61 %
Platelets: 293 10*3/uL (ref 150–400)
RBC: 4.25 MIL/uL (ref 4.22–5.81)
RDW: 15.9 % — ABNORMAL HIGH (ref 11.5–15.5)
WBC: 7.7 10*3/uL (ref 4.0–10.5)
nRBC: 0 % (ref 0.0–0.2)

## 2022-01-26 LAB — COMPREHENSIVE METABOLIC PANEL
ALT: 497 U/L — ABNORMAL HIGH (ref 0–44)
AST: 110 U/L — ABNORMAL HIGH (ref 15–41)
Albumin: 3.7 g/dL (ref 3.5–5.0)
Alkaline Phosphatase: 71 U/L (ref 38–126)
Anion gap: 6 (ref 5–15)
BUN: 20 mg/dL (ref 6–20)
CO2: 28 mmol/L (ref 22–32)
Calcium: 8.7 mg/dL — ABNORMAL LOW (ref 8.9–10.3)
Chloride: 103 mmol/L (ref 98–111)
Creatinine, Ser: 1.06 mg/dL (ref 0.61–1.24)
GFR, Estimated: >60 mL/min (ref >60)
Glucose, Bld: 89 mg/dL (ref 70–99)
Potassium: 3.8 mmol/L (ref 3.5–5.1)
Sodium: 137 mmol/L (ref 135–145)
Total Bilirubin: 0.9 mg/dL (ref 0.3–1.2)
Total Protein: 7.1 g/dL (ref 6.5–8.1)

## 2022-01-26 LAB — ETHANOL: Alcohol, Ethyl (B): <10 mg/dL (ref <10)

## 2022-01-26 MED ORDER — THIAMINE HCL 100 MG/ML IJ SOLN
100.0000 mg | Freq: Once | INTRAMUSCULAR | Status: AC
  Administered 2022-01-26: 100 mg via INTRAVENOUS
  Filled 2022-01-26: qty 2

## 2022-01-26 MED ORDER — SODIUM CHLORIDE 0.9 % IV BOLUS
1000.0000 mL | Freq: Once | INTRAVENOUS | Status: AC
  Administered 2022-01-26: 1000 mL via INTRAVENOUS
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
## 2022-01-26 NOTE — ED Notes (Signed)
Unable to update vitals. I went to the pts room to check on him and he was in a praying position facing the bed on the floor. The pt was mumbling incoherently. I asked him if he was ok and if I could get him anything. He said he was fine but then started mumbling incoherently again and hitting his bed on the mattress. Charge nurse informed.   ?
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
## 2022-01-27 ENCOUNTER — Encounter (HOSPITAL_COMMUNITY): Payer: Self-pay | Admitting: Emergency Medicine

## 2022-01-27 ENCOUNTER — Other Ambulatory Visit: Payer: Self-pay

## 2022-01-27 ENCOUNTER — Emergency Department (HOSPITAL_COMMUNITY): Payer: PRIVATE HEALTH INSURANCE

## 2022-01-27 ENCOUNTER — Emergency Department (HOSPITAL_COMMUNITY)
Admission: EM | Admit: 2022-01-27 | Discharge: 2022-01-27 | Disposition: A | Discharge status: Home / Self Care | Payer: PRIVATE HEALTH INSURANCE | Attending: Emergency Medicine | Admitting: Emergency Medicine

## 2022-01-27 DIAGNOSIS — D649 Anemia, unspecified: Secondary | ICD-10-CM | POA: Diagnosis not present

## 2022-01-27 DIAGNOSIS — R300 Dysuria: Secondary | ICD-10-CM | POA: Insufficient documentation

## 2022-01-27 DIAGNOSIS — I10 Essential (primary) hypertension: Secondary | ICD-10-CM | POA: Insufficient documentation

## 2022-01-27 DIAGNOSIS — R109 Unspecified abdominal pain: Secondary | ICD-10-CM | POA: Insufficient documentation

## 2022-01-27 LAB — CBC
HCT: 35.9 % — ABNORMAL LOW (ref 39.0–52.0)
Hemoglobin: 12.3 g/dL — ABNORMAL LOW (ref 13.0–17.0)
MCH: 26.3 pg (ref 26.0–34.0)
MCHC: 34.3 g/dL (ref 30.0–36.0)
MCV: 76.9 fL — ABNORMAL LOW (ref 80.0–100.0)
Platelets: 329 10*3/uL (ref 150–400)
RBC: 4.67 MIL/uL (ref 4.22–5.81)
RDW: 16.1 % — ABNORMAL HIGH (ref 11.5–15.5)
WBC: 7.6 10*3/uL (ref 4.0–10.5)
nRBC: 0 % (ref 0.0–0.2)

## 2022-01-27 LAB — RAPID URINE DRUG SCREEN, HOSP PERFORMED
Amphetamines: POSITIVE — AB
Barbiturates: NOT DETECTED
Benzodiazepines: NOT DETECTED
Cocaine: POSITIVE — AB
Opiates: NOT DETECTED
Tetrahydrocannabinol: NOT DETECTED

## 2022-01-27 LAB — URINALYSIS, ROUTINE W REFLEX MICROSCOPIC
Bacteria, UA: NONE SEEN
Bilirubin Urine: NEGATIVE
Glucose, UA: 50 mg/dL — AB
Hgb urine dipstick: NEGATIVE
Ketones, ur: NEGATIVE mg/dL
Leukocytes,Ua: NEGATIVE
Nitrite: NEGATIVE
Protein, ur: NEGATIVE mg/dL
Specific Gravity, Urine: 1.021 (ref 1.005–1.030)
pH: 7 (ref 5.0–8.0)

## 2022-01-27 LAB — BASIC METABOLIC PANEL
Anion gap: 6 (ref 5–15)
BUN: 14 mg/dL (ref 6–20)
CO2: 27 mmol/L (ref 22–32)
Calcium: 9.4 mg/dL (ref 8.9–10.3)
Chloride: 105 mmol/L (ref 98–111)
Creatinine, Ser: 0.93 mg/dL (ref 0.61–1.24)
GFR, Estimated: >60 mL/min (ref >60)
Glucose, Bld: 87 mg/dL (ref 70–99)
Potassium: 4.2 mmol/L (ref 3.5–5.1)
Sodium: 138 mmol/L (ref 135–145)

## 2022-01-27 LAB — HEPATIC FUNCTION PANEL
ALT: 401 U/L — ABNORMAL HIGH (ref 0–44)
AST: 90 U/L — ABNORMAL HIGH (ref 15–41)
Albumin: 3.7 g/dL (ref 3.5–5.0)
Alkaline Phosphatase: 80 U/L (ref 38–126)
Bilirubin, Direct: 0.1 mg/dL (ref 0.0–0.2)
Indirect Bilirubin: 0.4 mg/dL (ref 0.3–0.9)
Total Bilirubin: 0.5 mg/dL (ref 0.3–1.2)
Total Protein: 7 g/dL (ref 6.5–8.1)

## 2022-01-27 LAB — ETHANOL: Alcohol, Ethyl (B): <10 mg/dL (ref <10)

## 2022-01-27 LAB — CBG MONITORING, ED: Glucose-Capillary: 70 mg/dL (ref 70–99)

## 2022-01-27 LAB — LIPASE, BLOOD: Lipase: 23 U/L (ref 11–51)

## 2022-01-27 NOTE — Discharge Instructions (Signed)
Substance Abuse Treatment Programs ° °Intensive Outpatient Programs °[facility]     °[address]      °[place], [place]                   °[phone]      ° °[facility] °[address] °[place], [place] °[phone] ° °[facility]     °(Inpatient and outpatient)     °[address]           °[phone]   ° °[facility] °[phone] (Suboxone and Methadone) ° °[address]      °[place], [place] [address]      °[phone]      ° °[address] °[place], [place] °852-3033 ° °[facility] (Outpatient/Inpatient, Chemical)    °(insurance only) [phone]      °       °[facility] (Groups & Residential) °[place], [place] °[phone] ° °   °[facility]     °[address]     °[place], [place]      °[phone]      ° °[facility] (for caregivers and family) °[address] °[place], [place] °336-299-4655 ° ° ° ° ° °Residential Treatment Programs °[facility]      °[address], [place], [place] [address]  °([phone]      ° °[facility] °[address] , [place] [address] °[phone] ° °[facility]        °336-248-8914      ° °[facility] °1-800-659-3381 ° °[facility]             °[address]                                         °[place], [place]                                                °877-615-2722 or 336-784-9470                              ° °[facility] [place] °[address] °[place], [address] °[phone] ° °[facility]    °[address]      °[place], [place]     °[phone]      ° °[facility] °[address] °[place], [place] °336-285-9073 ° °[facility]   °[address]     °[place], [place] [address]     °[phone]      °Admissions: 8am-3pm M-F ° °[facility] °[address] °[place],  [place] °336-227-7417 ° °[facility]: Residential Program (90 Days)   °[place], [place]      °336-725-8389 or 800-758-6077    ° °[facility] °[place], [place] °(Walk in Hours over the weekend or by referral) ° °[facility] °[address], [place], [place] [address] °([phone] ° °Crisis Mobile: [facility]:  1-877-626-1772 (for crisis response 24 hours a day) °[facility] Hotline:      1-800-256-2452 °Outpatient Psychiatry and Counseling ° °[facility]: Mobile Crisis   Management 24 hours:  1-877-626-1772 ° °[facility] sliding scale fee and walk in schedule: M-F 8am-12pm/1pm-3pm °[address]  °[place], [place] [address] °[phone] ° °[facility] °[address] °[place], [place] [address] °[phone] ° °[facility] (Former[facility] known [facility])- new patient walk-in appointments available Monday - Friday 8am -3pm.          °[address] °[place], [place] [address] °336-676-6840 or crisis line- 336-676-6905 ° °[facility]/ Intensive Outpatient Therapy Program °[address] °[place], [place] [address] °336-832-9804 ° °[facility]                  °Crisis Services      °[phone]      °[address]     °[place], [place] [address]                ° °[facility]   °[facility] °[phone] °[address] °[place], [place] [address] ° ° °[facility]          °[address]  °[place], [place] [address]       °([phone] ° °[facility] °[address] °[place], [place] [address] °336-292-1510 ° °[facility]    °[address]    °[place], [place] [address]     °[phone]      ° °Parish McKinney, MD     °[address]     °[place] [address]     °336-282-1251     °  °[facility] °[address] °[place] [address] ° °[facility]     °[address]     °[place], [place]      °336-542-2076      ° °[facility] °Shamsher Ahluwalia, MD °[address] °[place], [place] [address] °[phone] ° °[facility]     °[address]     °[place], [place] [address]     °336-274-1237      ° °[facility] °[address] °[place], [place] [address] °336-854-4450 °Resources for Temporary Residential Assistance/Crisis Centers ° °DAY CENTERS °[facility] °M-F 8am-3pm   °[address] [place], [place] [address]   336-332-0824 °Services include: laundry, barbering, support groups, case management, phone  & computer access, showers, AA/NA mtgs, mental health/substance abuse nurse, job skills class, disability information, VA assistance, spiritual classes, etc.  ° °HOMELESS SHELTERS ° °[facility]     °[facility]   °[address], [place]     °[phone]       °       °[facility] (women and children)       °[address] °[place], [place] [address] °336-275-0820 °[email or web address] for application and process °Application Required ° °[facility]   °[address]    °[place] [address]     °[phone]       °             °[facility] °[address] °, [place] [address] °336.273.5572 °336-235-0363(schedule application appt.) °Application Required ° °[facility] (women only)    °[address]     °[place],  [address]     °336-884-1039      °  Intake starts 6pm daily °Need valid ID, SSC, & Police report °[facility] [place] °[address] °[place], [place] °336-881-5420 °Application Required ° °[facility] (men only)     °[address]      °[place], [place]     °[phone]      ° °[facility] °(Pregnant women only) °[address] °[place], [place] °336-275-0206 ° °[facility]      °[address]      °[place], [place] [address]     °[phone]      °       °[facility] °[address] °[place], [place] °336-723-1848 °90 day commitment/SA/Application process ° °[facility](men only)     °[address]     °[place], [place]     °[phone]       °Check-in at 7pm     °       °[facility] °[address] °[place], [place] [address] °336-248-6684 °Men/Women/Women and Children must be there by 7 pm ° °[facility] °[place],  °336-722-8721                ° °

## 2022-01-27 NOTE — ED Triage Notes (Signed)
Pt states that he has been feeling dizzy and experiencing R flank pain since Tues. Feels like stabbing pain, intermittent, not currently present.  ?Denies dysuria, burning when urinating, blood in urine. ? ?Denies drug use today. Has had 2 beers today pta.  ?

## 2022-01-27 NOTE — ED Provider Notes (Signed)
? ?Emergency Department Provider Note ? ? ?I have reviewed the triage vital signs and the nursing notes. ? ? ?HISTORY ? ?Chief Complaint ?Dizziness and Flank Pain ? ? ?HPI ?Tristan [name] is a 36 y.o. male presents to the ED with left flank pain.  Patient reports drinking beer and has history of polysubstance abuse but denies drug use last night.  States he has had several episodes of sharp/stabbing left-sided flank pain and feels he may have some blood in the urine.  He does have some pain with urination.  No chest pain or shortness of breath symptoms.  No fevers or chills.  Denies prior history of kidney stone.  ? ? ?Past Medical History:  ?Diagnosis Date  ? Bipolar 1 disorder (HCC)   ? Hepatitis C   ? Hypertension   ? PTSD (post-traumatic stress disorder)   ? ? ?Review of Systems ? ?Constitutional: No fever/chills ?Cardiovascular: Denies chest pain. ?Respiratory: Denies shortness of breath. ?Gastrointestinal: Positive left flank/abdominal pain.  No nausea, no vomiting.  No diarrhea.  No constipation. ?Genitourinary: Negative for dysuria. ?Musculoskeletal: Negative for back pain. ?Skin: Negative for rash. ?Neurological: Negative for headaches, focal weakness or numbness. ? ? ?____________________________________________ ? ? ?PHYSICAL EXAM: ? ?VITAL SIGNS: ?ED Triage Vitals  ?Enc Vitals Group  ?   BP 01/27/22 0523 (!) 152/93  ?   Pulse Rate 01/27/22 0523 77  ?   Resp 01/27/22 0523 16  ?   Temp 01/27/22 0523 97.9 ?F (36.6 ?C)  ?   Temp Source 01/27/22 0523 Oral  ?   SpO2 01/27/22 0523 100 %  ?   Weight 01/27/22 0522 178 lb 9.2 oz (81 kg)  ? ?Constitutional: Sleepy but arouses to voice. Conversational.  ?Eyes: Conjunctivae are normal. ?Head: Atraumatic. ?Nose: No congestion/rhinnorhea. ?Mouth/Throat: Mucous membranes are dry.   ?Neck: No stridor.   ?Cardiovascular: Normal rate, regular rhythm. Good peripheral circulation. Grossly normal heart sounds.   ?Respiratory: Normal respiratory effort.  No  retractions. Lungs CTAB. ?Gastrointestinal: Soft and nontender. No distention.  ?Musculoskeletal: No lower extremity tenderness nor edema. No gross deformities of extremities. ?Neurologic:  Normal speech and language. No gross focal neurologic deficits are appreciated.  ?Skin:  Skin is warm, dry and intact. No rash noted. ? ?____________________________________________ ?  ?LABS ?(all labs ordered are listed, but only abnormal results are displayed) ? ?Labs Reviewed  ?URINALYSIS, ROUTINE W REFLEX MICROSCOPIC - Abnormal; Notable for the following components:  ?    Result Value  ? APPearance TURBID (*)   ? Glucose, UA 50 (*)   ? All other components within normal limits  ?CBC - Abnormal; Notable for the following components:  ? Hemoglobin 12.3 (*)   ? HCT 35.9 (*)   ? MCV 76.9 (*)   ? RDW 16.1 (*)   ? All other components within normal limits  ?RAPID URINE DRUG SCREEN, HOSP PERFORMED - Abnormal; Notable for the following components:  ? Cocaine POSITIVE (*)   ? Amphetamines POSITIVE (*)   ? All other components within normal limits  ?HEPATIC FUNCTION PANEL - Abnormal; Notable for the following components:  ? AST 90 (*)   ? ALT 401 (*)   ? All other components within normal limits  ?BASIC METABOLIC PANEL  ?ETHANOL  ?LIPASE, BLOOD  ?CBG MONITORING, ED  ? ? ?____________________________________________ ? ? ?PROCEDURES ? ?Procedure(s) performed:  ? ?Procedures ? ?None ?____________________________________________ ? ? ?INITIAL IMPRESSION / ASSESSMENT AND PLAN / ED COURSE ? ?Pertinent labs & imaging results that  were available during my care of the patient were reviewed by me and considered in my medical decision making (see chart for details). ?  ?This patient is Presenting for Evaluation of abdominal pain, which does require a range of treatment options, and is a complaint that involves a high risk of morbidity and mortality. ? ?The Differential Diagnoses includes but is not exclusive to acute appendicitis, renal colic,  testicular torsion, urinary tract infection, prostatitis,  diverticulitis, small bowel obstruction, colitis, abdominal aortic aneurysm, gastroenteritis, constipation etc. ? ? ?I decided to review pertinent External Data, and in summary patient seen in the emergency department yesterday with agitation and intoxication thought to be related to drug use. ?  ?Clinical Laboratory Tests Ordered, included UA reviewed without hemoglobin or evidence of infection.  Kidney function normal.  No leukocytosis.  Mild anemia 12.3. ? ?Radiologic Tests Ordered, included CT renal. I independently interpreted the images and agree with radiology interpretation.  ? ?Cardiac Monitor Tracing which shows NSR. ? ? ?Social Determinants of Health Risk positive polysubstance abuse history.  ? ? ?Medical Decision Making: Summary:  ?Patient presents to the emergency department for evaluation of left flank pain which is sharp.  Will obtain CT renal to evaluate for ureteral stone have added on LFTs, lipase, ethanol, UDS.  Patient is drowsy on my assessment but awakens to voice and is conversational.  Does report drinking beer prior to arrival.  ? ?Reevaluation with update and discussion with patient. CT is reassuring. Discussed polysubstance abuse and provided list of area resources.  ? ?Disposition: discharge ? ?____________________________________________ ? ?FINAL CLINICAL IMPRESSION(S) / ED DIAGNOSES ? ?Final diagnoses:  ?Left flank pain  ? ? ? ?Note:  This document was prepared using Dragon voice recognition software and may include unintentional dictation errors. ? ?Alona Bene, MD, FACEP ?Emergency Medicine ? ?  ?Maia Plan, MD ?01/29/22 [phone] ? ?

## 2022-01-27 NOTE — ED Notes (Signed)
Patient transported to CT 

## 2022-01-28 ENCOUNTER — Emergency Department (HOSPITAL_COMMUNITY)
Admission: EM | Admit: 2022-01-28 | Discharge: 2022-01-28 | Disposition: A | Discharge status: Home / Self Care | Payer: PRIVATE HEALTH INSURANCE | Attending: Emergency Medicine | Admitting: Emergency Medicine

## 2022-01-28 ENCOUNTER — Encounter (HOSPITAL_COMMUNITY): Payer: Self-pay | Admitting: Emergency Medicine

## 2022-01-28 ENCOUNTER — Other Ambulatory Visit: Payer: Self-pay

## 2022-01-28 DIAGNOSIS — G4700 Insomnia, unspecified: Secondary | ICD-10-CM | POA: Insufficient documentation

## 2022-01-28 DIAGNOSIS — F419 Anxiety disorder, unspecified: Secondary | ICD-10-CM | POA: Diagnosis not present

## 2022-01-28 DIAGNOSIS — R259 Unspecified abnormal involuntary movements: Secondary | ICD-10-CM | POA: Diagnosis not present

## 2022-01-28 NOTE — Discharge Instructions (Signed)
Your history and exam today are concerning for some different movements likely related to your drug use and lack of sleep.  Please try to rest and stay hydrated and follow-up with both your primary medical team and your psychiatry team.  Please get back on your home medications and try to avoid drugs.  If any symptoms change or worsen acutely, please return to the emergency department. ?

## 2022-01-28 NOTE — ED Triage Notes (Signed)
Pt arrives w/ [facility] from [facility] w/ c/o erratic movement. Hx anxiety, depression, PTSD ?Off meds x1week.  ?VSS  ?

## 2022-01-28 NOTE — ED Provider Notes (Signed)
?[name] DEPT ?Provider Note ? ? ?CSN: [id number] ?Arrival date & time: 01/28/22  1010 ? ?  ? ?History ? ?Chief Complaint  ?Patient presents with  ? Psychiatric Evaluation  ? ? ?Tristan [name] is a 36 y.o. male. ? ?The history is provided by the patient and medical records. No language interpreter was used.  ?Mental Health Problem ?Presenting symptoms: bizarre behavior   ?Presenting symptoms: no agitation, no depression, no hallucinations, no homicidal ideas, no paranoid behavior, no self-mutilation, no suicidal thoughts, no suicidal threats and no suicide attempt   ?Onset quality:  Unable to specify ?Duration:  1 day ?Timing:  Sporadic ?Progression:  Unchanged ?Chronicity:  Chronic ?Context: alcohol use and drug abuse   ?Context: not medication   ?Treatment compliance:  Untreated ?Time since last psychoactive medication taken:  1 week ?Relieved by:  Nothing ?Worsened by:  Drugs ?Ineffective treatments:  None tried ?Associated symptoms: anxiety and insomnia   ?Associated symptoms: no abdominal pain, no anhedonia, no chest pain, no fatigue, no headaches and no irritability   ?Risk factors: hx of mental illness   ?Risk factors: no hx of suicide attempts   ? ?  ? ?Home Medications ?Prior to Admission medications   ?Medication Sig Start Date End Date Taking? Authorizing Provider  ?escitalopram (LEXAPRO) 10 MG tablet Take 1 tablet (10 mg total) by mouth daily. 01/23/22 02/22/22  Armando Reichert, MD  ?gabapentin (NEURONTIN) 100 MG capsule Take 1 capsule (100 mg total) by mouth 3 (three) times daily. 01/22/22 02/21/22  Armando Reichert, MD  ?hydrOXYzine (ATARAX) 25 MG tablet Take 1 tablet (25 mg total) by mouth 3 (three) times daily as needed for anxiety. 01/22/22   Armando Reichert, MD  ?Multiple Vitamin (MULTIVITAMIN WITH MINERALS) TABS tablet Take 1 tablet by mouth daily. 01/23/22   Armando Reichert, MD  ?nicotine polacrilex (NICORETTE) 2 MG gum Take 1 each (2 mg total) by mouth as needed for smoking  cessation. 01/22/22   Armando Reichert, MD  ?pantoprazole (PROTONIX) 40 MG tablet Take 1 tablet (40 mg total) by mouth daily. 01/23/22   Armando Reichert, MD  ?QUEtiapine (SEROQUEL) 100 MG tablet Take 1 tablet (100 mg total) by mouth at bedtime. 01/22/22   Armando Reichert, MD  ?thiamine 100 MG tablet Take 1 tablet (100 mg total) by mouth daily. 01/23/22   Armando Reichert, MD  ?   ? ?Allergies    ?Tomato   ? ?Review of Systems   ?Review of Systems  ?Constitutional:  Negative for chills, diaphoresis, fatigue, fever and irritability.  ?HENT:  Negative for congestion.   ?Eyes:  Negative for visual disturbance.  ?Respiratory:  Negative for cough, chest tightness, shortness of breath and wheezing.   ?Cardiovascular:  Negative for chest pain, palpitations and leg swelling.  ?Gastrointestinal:  Negative for abdominal pain, constipation, diarrhea, nausea and vomiting.  ?Genitourinary:  Negative for dysuria, flank pain and frequency.  ?Musculoskeletal:  Negative for back pain, neck pain and neck stiffness.  ?Skin:  Negative for rash and wound.  ?Neurological:  Negative for dizziness, tremors, seizures, syncope, facial asymmetry, speech difficulty, weakness, light-headedness, numbness and headaches.  ?Psychiatric/Behavioral:  Negative for agitation, confusion, hallucinations, homicidal ideas, paranoia, self-injury and suicidal ideas. The patient is nervous/anxious and has insomnia.   ?All other systems reviewed and are negative. ? ?Physical Exam ?Updated Vital Signs ?BP (!) 138/91 (BP Location: Left Arm)   Pulse 91   Temp 98 ?F (36.7 ?C) (Oral)   Resp 16   SpO2 98%  ?  Physical Exam ?Vitals and nursing note reviewed.  ?Constitutional:   ?   General: He is not in acute distress. ?   Appearance: He is well-developed. He is not ill-appearing, toxic-appearing or diaphoretic.  ?HENT:  ?   Head: Normocephalic and atraumatic.  ?   Nose: Nose normal. No congestion or rhinorrhea.  ?   Mouth/Throat:  ?   Mouth: Mucous membranes are moist.  ?   Pharynx:  No oropharyngeal exudate or posterior oropharyngeal erythema.  ?Eyes:  ?   Extraocular Movements: Extraocular movements intact.  ?   Conjunctiva/sclera: Conjunctivae normal.  ?   Pupils: Pupils are equal, round, and reactive to light.  ?Cardiovascular:  ?   Rate and Rhythm: Normal rate and regular rhythm.  ?   Heart sounds: No murmur heard. ?Pulmonary:  ?   Effort: Pulmonary effort is normal. No respiratory distress.  ?   Breath sounds: Normal breath sounds. No wheezing, rhonchi or rales.  ?Chest:  ?   Chest wall: No tenderness.  ?Abdominal:  ?   Palpations: Abdomen is soft.  ?   Tenderness: There is no abdominal tenderness. There is no right CVA tenderness, left CVA tenderness, guarding or rebound.  ?Musculoskeletal:     ?   General: No swelling or tenderness.  ?   Cervical back: Neck supple. No tenderness.  ?   Right lower leg: No edema.  ?   Left lower leg: No edema.  ?Skin: ?   General: Skin is warm and dry.  ?   Capillary Refill: Capillary refill takes less than 2 seconds.  ?   Findings: No erythema or rash.  ?Neurological:  ?   General: No focal deficit present.  ?   Mental Status: He is alert and oriented to person, place, and time.  ?   GCS: GCS eye subscore is 4. GCS verbal subscore is 5. GCS motor subscore is 6.  ?   Cranial Nerves: No cranial nerve deficit.  ?   Sensory: No sensory deficit.  ?   Motor: No weakness, tremor, abnormal muscle tone or seizure activity.  ?   Gait: Gait normal.  ?   Comments: Patient had intermittent jerking of arms but no focal tremors.  Intact sensation and strength and pulses in extremities.  Was able to ambulate without persistent difficulty.  ?Psychiatric:     ?   Mood and Affect: Mood normal.  ? ? ?ED Results / Procedures / Treatments   ?Labs ?(all labs ordered are listed, but only abnormal results are displayed) ?Labs Reviewed  ?URINE CULTURE  ?COMPREHENSIVE METABOLIC PANEL  ?ETHANOL  ?CBC  ?RAPID URINE DRUG SCREEN, HOSP PERFORMED  ?AMMONIA  ?ACETAMINOPHEN LEVEL   ?URINALYSIS, ROUTINE W REFLEX MICROSCOPIC  ?MAGNESIUM  ? ? ?EKG ?None ? ?Radiology ?CT Renal Stone Study ? ?Result Date: 01/27/2022 ?CLINICAL DATA:  36 year old male with history of left flank pain. Suspected kidney stone. EXAM: CT ABDOMEN AND PELVIS WITHOUT CONTRAST TECHNIQUE: Multidetector CT imaging of the abdomen and pelvis was performed following the standard protocol without IV contrast. RADIATION DOSE REDUCTION: This exam was performed according to the departmental dose-optimization program which includes automated exposure control, adjustment of the mA and/or kV according to patient size and/or use of iterative reconstruction technique. COMPARISON:  No priors available for comparison. FINDINGS: Lower chest: Unremarkable. Hepatobiliary: No suspicious cystic or solid hepatic lesions are confidently identified on today's noncontrast CT examination. Unenhanced appearance of the gallbladder is normal. Pancreas: No definite pancreatic mass or peripancreatic fluid  collections or inflammatory changes are noted on today's noncontrast CT examination. Spleen: Unremarkable. Adrenals/Urinary Tract: There are no abnormal calcifications within the collecting system of either kidney, along the course of either ureter, or within the lumen of the urinary bladder. No hydroureteronephrosis or perinephric stranding to suggest urinary tract obstruction at this time. The unenhanced appearance of the kidneys is unremarkable bilaterally. Unenhanced appearance of the urinary bladder is normal. Bilateral adrenal glands are normal in appearance. Stomach/Bowel: Unenhanced appearance of the stomach is normal. There is no pathologic dilatation of small bowel or colon. The appendix is not confidently identified and may be surgically absent. Regardless, there are no inflammatory changes noted adjacent to the cecum to suggest the presence of an acute appendicitis at this time. Vascular/Lymphatic: No atherosclerotic calcifications are noted in  the abdominal aorta or pelvic vasculature. No lymphadenopathy noted in the abdomen or pelvis. Reproductive: Prostate gland and seminal vesicles are unremarkable in appearance. Other: No significant volume of asc

## 2022-01-28 NOTE — ED Notes (Signed)
Patient left before discharge process could be completed.  

## 2022-01-29 ENCOUNTER — Emergency Department (HOSPITAL_COMMUNITY)
Admission: EM | Admit: 2022-01-29 | Discharge: 2022-01-30 | Disposition: A | Discharge status: Home / Self Care | Payer: PRIVATE HEALTH INSURANCE | Attending: Emergency Medicine | Admitting: Emergency Medicine

## 2022-01-29 ENCOUNTER — Other Ambulatory Visit: Payer: Self-pay

## 2022-01-29 ENCOUNTER — Encounter (HOSPITAL_COMMUNITY): Payer: Self-pay

## 2022-01-29 DIAGNOSIS — F10129 Alcohol abuse with intoxication, unspecified: Secondary | ICD-10-CM | POA: Diagnosis not present

## 2022-01-29 DIAGNOSIS — F1910 Other psychoactive substance abuse, uncomplicated: Secondary | ICD-10-CM

## 2022-01-29 DIAGNOSIS — Y92830 Public park as the place of occurrence of the external cause: Secondary | ICD-10-CM | POA: Insufficient documentation

## 2022-01-29 DIAGNOSIS — S59911A Unspecified injury of right forearm, initial encounter: Secondary | ICD-10-CM | POA: Diagnosis present

## 2022-01-29 DIAGNOSIS — X58XXXA Exposure to other specified factors, initial encounter: Secondary | ICD-10-CM | POA: Diagnosis not present

## 2022-01-29 DIAGNOSIS — S50811A Abrasion of right forearm, initial encounter: Secondary | ICD-10-CM | POA: Insufficient documentation

## 2022-01-29 MED ORDER — LACTATED RINGERS IV BOLUS
1000.0000 mL | Freq: Once | INTRAVENOUS | Status: AC
  Administered 2022-01-30: 1000 mL via INTRAVENOUS

## 2022-01-29 MED ORDER — LORAZEPAM 1 MG PO TABS
1.0000 mg | ORAL_TABLET | Freq: Once | ORAL | Status: AC
  Administered 2022-01-29: 1 mg via ORAL
  Filled 2022-01-29: qty 1

## 2022-01-29 MED ORDER — ZIPRASIDONE MESYLATE 20 MG IM SOLR
20.0000 mg | Freq: Once | INTRAMUSCULAR | Status: AC
  Administered 2022-01-30: 20 mg via INTRAMUSCULAR
  Filled 2022-01-29: qty 20

## 2022-01-29 NOTE — ED Provider Notes (Signed)
?[facility]-EMERGENCY DEPT ?Provider Note ? ? ?CSN: [id number] ?Arrival date & time: 01/29/22  2044 ? ?  ? ?History ? ?Chief Complaint  ?Patient presents with  ? Drug Problem  ? ? ?Tristan [name] is a 36 y.o. male. ? ?36 yo M here with a chief complaints of being found intoxicated in a park.  The patient was found by police and his mother was notified and she brought him here.  The patient thinks that maybe he did drugs and is feeling a bit paranoid.  He was recently in a rehab facility but was discharged and has not been able to get into a long-term facility due to untreated hepatitis C. ? ? ?Drug Problem ? ? ?  ? ?Home Medications ?Prior to Admission medications   ?Medication Sig Start Date End Date Taking? Authorizing Provider  ?escitalopram (LEXAPRO) 10 MG tablet Take 1 tablet (10 mg total) by mouth daily. 01/23/22 02/22/22  Karsten Ro, MD  ?gabapentin (NEURONTIN) 100 MG capsule Take 1 capsule (100 mg total) by mouth 3 (three) times daily. 01/22/22 02/21/22  Karsten Ro, MD  ?hydrOXYzine (ATARAX) 25 MG tablet Take 1 tablet (25 mg total) by mouth 3 (three) times daily as needed for anxiety. 01/22/22   Karsten Ro, MD  ?Multiple Vitamin (MULTIVITAMIN WITH MINERALS) TABS tablet Take 1 tablet by mouth daily. 01/23/22   Karsten Ro, MD  ?nicotine polacrilex (NICORETTE) 2 MG gum Take 1 each (2 mg total) by mouth as needed for smoking cessation. 01/22/22   Karsten Ro, MD  ?pantoprazole (PROTONIX) 40 MG tablet Take 1 tablet (40 mg total) by mouth daily. 01/23/22   Karsten Ro, MD  ?QUEtiapine (SEROQUEL) 100 MG tablet Take 1 tablet (100 mg total) by mouth at bedtime. 01/22/22   Karsten Ro, MD  ?thiamine 100 MG tablet Take 1 tablet (100 mg total) by mouth daily. 01/23/22   Karsten Ro, MD  ?   ? ?Allergies    ?Tomato   ? ?Review of Systems   ?Review of Systems ? ?Physical Exam ?Updated Vital Signs ?BP (!) 138/119 (BP Location: Right Arm)   Pulse 99   Temp 99 ?F (37.2 ?C) (Oral)   Resp (!) 22    Ht 5\' 10"  (1.778 m)   Wt 81.6 kg   SpO2 99%   BMI 25.83 kg/m?  ?Physical Exam ?Vitals and nursing note reviewed.  ?Constitutional:   ?   Appearance: He is well-developed.  ?HENT:  ?   Head: Normocephalic and atraumatic.  ?Eyes:  ?   Pupils: Pupils are equal, round, and reactive to light.  ?Neck:  ?   Vascular: No JVD.  ?Cardiovascular:  ?   Rate and Rhythm: Normal rate and regular rhythm.  ?   Heart sounds: No murmur heard. ?  No friction rub. No gallop.  ?Pulmonary:  ?   Effort: No respiratory distress.  ?   Breath sounds: No wheezing.  ?Abdominal:  ?   General: There is no distension.  ?   Tenderness: There is no abdominal tenderness. There is no guarding or rebound.  ?Musculoskeletal:     ?   General: Normal range of motion.  ?   Cervical back: Normal range of motion and neck supple.  ?   Comments: The patient makes some jerking movements but is able to ambulate independently to the bed.   ? ?Excoriations of the right forearm.  Some old appearing lesions to the posterior aspect of the scalp.  Palpated from head  to toe without any noted areas of bony tenderness.  ?Skin: ?   Coloration: Skin is not pale.  ?   Findings: No rash.  ?Neurological:  ?   Mental Status: He is alert and oriented to person, place, and time.  ?Psychiatric:     ?   Behavior: Behavior normal.  ? ? ?ED Results / Procedures / Treatments   ?Labs ?(all labs ordered are listed, but only abnormal results are displayed) ?Labs Reviewed - No data to display ? ?EKG ?None ? ?Radiology ?No results found. ? ?Procedures ?Procedures  ? ? ?Medications Ordered in ED ?Medications  ?LORazepam (ATIVAN) tablet 1 mg (1 mg Oral Given 01/29/22 2233)  ? ? ?ED Course/ Medical Decision Making/ A&P ?  ?                        ?Medical Decision Making ?Risk ?Prescription drug management. ? ? ?36 yo M with a chief complaints of being found in a park.  The patient has been seen multiple times in the past few weeks for the same.  Was actually seen less than 48 hours ago  with a similar complaints. ? ?I do not feel that the patient would benefit from hospitalization and psychiatric consult in the ED or a repeat laboratory evaluation.  The mother of the patient is very upset and feels like all those things need to be done.  I had a long discussion with her about how he really needs to have outpatient rehab and to focus on trying to stop doing illegal drugs.  Mother told me that I was ridiculous for not ordering blood work on him even that was done less than 48 hours ago.  She did not seem to understand when I tried to explain how we do not offer inpatient rehab for alcohol or illegal drugs.  ? ?Will obs in the ED.  ? ?Patient reassessed and still not feeling right.  We will continue to hobs in the ED.  Patient care was signed out to Dr. Bebe Shaggy please see his note for further care in the ED. ? ?The patients results and plan were reviewed and discussed.   ?Any x-rays performed were independently reviewed by myself.  ? ?Differential diagnosis were considered with the presenting HPI. ? ?Medications  ?LORazepam (ATIVAN) tablet 1 mg (1 mg Oral Given 01/29/22 2233)  ? ? ?Vitals:  ? 01/29/22 2054  ?BP: (!) 138/119  ?Pulse: 99  ?Resp: (!) 22  ?Temp: 99 ?F (37.2 ?C)  ?TempSrc: Oral  ?SpO2: 99%  ?Weight: 81.6 kg  ?Height: 5\' 10"  (1.778 m)  ? ? ?Final diagnoses:  ?Polysubstance abuse (HCC)  ? ? ?Admission/ observation were discussed with the admitting physician, patient and/or family and they are comfortable with the plan.  ? ? ? ? ? ? ? ?Final Clinical Impression(s) / ED Diagnoses ?Final diagnoses:  ?Polysubstance abuse (HCC)  ? ? ?Rx / DC Orders ?ED Discharge Orders   ? ? None  ? ?  ? ? ?  ? , DO ?01/29/22 2321 ? ?

## 2022-01-29 NOTE — Discharge Instructions (Addendum)
He should stop using illegal drugs.  He should follow-up with your family doctor in the office.  I have attached a list of resources that can help you.  The behavioral health center is now available to see you 24 hours a day 7 days a week. ? ?It was a pleasure caring for you today in the emergency department. ? ?Please return to the emergency department for any worsening or worrisome symptoms. ? ?

## 2022-01-29 NOTE — ED Triage Notes (Signed)
Pt BIB EMS. Pt has been using drugs unknown drug. Pt is responsive and scratching himself and feels like something is crawling on him and in him.  ? ?148/92 ?94 hr ?99 % ra  ?

## 2022-01-29 NOTE — ED Notes (Signed)
Pt provided with soap, towels, washcloths, tooth brush/paste, and deodorant per his request. Pt also given a [place] sandwich, graham crackers, cheese,and cola soda.  ?

## 2022-01-30 ENCOUNTER — Emergency Department (HOSPITAL_COMMUNITY)
Admission: EM | Admit: 2022-01-30 | Discharge: 2022-01-30 | Discharge status: Against Medical Advice | Payer: PRIVATE HEALTH INSURANCE

## 2022-01-30 LAB — COMPREHENSIVE METABOLIC PANEL
ALT: 191 U/L — ABNORMAL HIGH (ref 0–44)
AST: 53 U/L — ABNORMAL HIGH (ref 15–41)
Albumin: 3.6 g/dL (ref 3.5–5.0)
Alkaline Phosphatase: 64 U/L (ref 38–126)
Anion gap: 6 (ref 5–15)
BUN: 16 mg/dL (ref 6–20)
CO2: 24 mmol/L (ref 22–32)
Calcium: 8.1 mg/dL — ABNORMAL LOW (ref 8.9–10.3)
Chloride: 110 mmol/L (ref 98–111)
Creatinine, Ser: 0.86 mg/dL (ref 0.61–1.24)
GFR, Estimated: >60 mL/min (ref >60)
Glucose, Bld: 83 mg/dL (ref 70–99)
Potassium: 3.9 mmol/L (ref 3.5–5.1)
Sodium: 140 mmol/L (ref 135–145)
Total Bilirubin: 0.7 mg/dL (ref 0.3–1.2)
Total Protein: 6.6 g/dL (ref 6.5–8.1)

## 2022-01-30 LAB — CBC WITH DIFFERENTIAL/PLATELET
Abs Immature Granulocytes: 0.03 10*3/uL (ref 0.00–0.07)
Basophils Absolute: 0.1 10*3/uL (ref 0.0–0.1)
Basophils Relative: 1 %
Eosinophils Absolute: 0.1 10*3/uL (ref 0.0–0.5)
Eosinophils Relative: 2 %
HCT: 35 % — ABNORMAL LOW (ref 39.0–52.0)
Hemoglobin: 12.3 g/dL — ABNORMAL LOW (ref 13.0–17.0)
Immature Granulocytes: 0 %
Lymphocytes Relative: 31 %
Lymphs Abs: 2.3 10*3/uL (ref 0.7–4.0)
MCH: 27.4 pg (ref 26.0–34.0)
MCHC: 35.1 g/dL (ref 30.0–36.0)
MCV: 78 fL — ABNORMAL LOW (ref 80.0–100.0)
Monocytes Absolute: 0.6 10*3/uL (ref 0.1–1.0)
Monocytes Relative: 8 %
Neutro Abs: 4.3 10*3/uL (ref 1.7–7.7)
Neutrophils Relative %: 58 %
Platelets: 287 10*3/uL (ref 150–400)
RBC: 4.49 MIL/uL (ref 4.22–5.81)
RDW: 16.3 % — ABNORMAL HIGH (ref 11.5–15.5)
WBC: 7.4 10*3/uL (ref 4.0–10.5)
nRBC: 0 % (ref 0.0–0.2)

## 2022-01-30 LAB — ETHANOL: Alcohol, Ethyl (B): <10 mg/dL (ref <10)

## 2022-01-30 LAB — CK: Total CK: 759 U/L — ABNORMAL HIGH (ref 49–397)

## 2022-01-30 LAB — ACETAMINOPHEN LEVEL: Acetaminophen (Tylenol), Serum: <10 ug/mL — ABNORMAL LOW (ref 10–30)

## 2022-01-30 LAB — SALICYLATE LEVEL: Salicylate Lvl: <7 mg/dL — ABNORMAL LOW (ref 7.0–30.0)

## 2022-01-30 MED ORDER — ACETAMINOPHEN 325 MG PO TABS
650.0000 mg | ORAL_TABLET | Freq: Once | ORAL | Status: AC
  Administered 2022-01-30: 650 mg via ORAL
  Filled 2022-01-30: qty 2

## 2022-01-30 NOTE — ED Notes (Signed)
Provided patient with breakfast sandwich and apple juice ?

## 2022-01-30 NOTE — ED Provider Notes (Signed)
?  Provider Note ?MRN:  [id number]  ?Arrival date & time: 01/30/22    ?ED Course and Medical Decision Making  ?Assumed care from [facility] at shift change. ? ?See not from prior team for complete details, in brief:  ?36 year old male ?To the ED secondary to intoxication. ?History of prior polysubstance use, prior stent in rehab. ? ?Labs reviewed, CPK is mildly elevated.  No renal insufficiency.  Tolerating p.o. ? ?Patient is not suicidal. ?Patient appears clinically sober.   ?Not psychotic.  No SI or HI ?He is tolerant p.o. intake.  ?Advised him to refrain from illicit drug use. ? ?The patient improved significantly and was discharged in stable condition. Detailed discussions were had with the patient regarding current findings, and need for close f/u with PCP or on call doctor. The patient has been instructed to return immediately if the symptoms worsen in any way for re-evaluation. Patient verbalized understanding and is in agreement with current care plan. All questions answered prior to discharge. ? ? ? ? ? ?Procedures ? ?Final Clinical Impressions(s) / ED Diagnoses  ? ?  ICD-10-CM   ?1. Polysubstance abuse (HCC)  F19.10   ?  ?  ?ED Discharge Orders   ? ? None  ? ?  ?  ? ? ?Discharge Instructions   ? ?  ?He should stop using illegal drugs.  He should follow-up with your family doctor in the office.  I have attached a list of resources that can help you.  The behavioral health center is now available to see you 24 hours a day 7 days a week. ? ?It was a pleasure caring for you today in the emergency department. ? ?Please return to the emergency department for any worsening or worrisome symptoms. ? ? ? ? ? ?  ?Sloan Leiter, DO ?01/30/22 1232 ? ?

## 2022-01-30 NOTE — ED Provider Notes (Signed)
I assumed care in signout.  Patient became more agitated, sitting on the floor and twisting his body around and appeared agitated. ?Patient given Earnestine Leys is now resting comfortably ?Labs are overall unremarkable. ?  ?Zadie Rhine, MD ?01/30/22 0130 ? ?

## 2022-01-30 NOTE — ED Notes (Signed)
Pt currently sleeping, respirations equal & unlabored.  ?

## 2022-01-30 NOTE — ED Provider Notes (Signed)
Pt slept throughout the night.  He is still somnolent and not awake enough to be discharged ?  ?Tristan Rhine, MD ?01/30/22 [id number] ? ?

## 2022-01-30 NOTE — ED Notes (Signed)
Pt left the facility 

## 2022-01-31 ENCOUNTER — Other Ambulatory Visit: Payer: Self-pay

## 2022-01-31 ENCOUNTER — Emergency Department (HOSPITAL_COMMUNITY)
Admission: EM | Admit: 2022-01-31 | Discharge: 2022-02-01 | Disposition: A | Discharge status: Other Acute Inpatient Hospital | Payer: PRIVATE HEALTH INSURANCE | Attending: Emergency Medicine | Admitting: Emergency Medicine

## 2022-01-31 ENCOUNTER — Encounter (HOSPITAL_COMMUNITY): Payer: Self-pay | Admitting: Emergency Medicine

## 2022-01-31 DIAGNOSIS — F4310 Post-traumatic stress disorder, unspecified: Secondary | ICD-10-CM | POA: Diagnosis not present

## 2022-01-31 DIAGNOSIS — F1520 Other stimulant dependence, uncomplicated: Secondary | ICD-10-CM | POA: Insufficient documentation

## 2022-01-31 DIAGNOSIS — F1420 Cocaine dependence, uncomplicated: Secondary | ICD-10-CM | POA: Insufficient documentation

## 2022-01-31 DIAGNOSIS — R443 Hallucinations, unspecified: Secondary | ICD-10-CM | POA: Diagnosis not present

## 2022-01-31 DIAGNOSIS — F1120 Opioid dependence, uncomplicated: Secondary | ICD-10-CM | POA: Insufficient documentation

## 2022-01-31 DIAGNOSIS — F315 Bipolar disorder, current episode depressed, severe, with psychotic features: Secondary | ICD-10-CM | POA: Insufficient documentation

## 2022-01-31 DIAGNOSIS — I10 Essential (primary) hypertension: Secondary | ICD-10-CM | POA: Diagnosis not present

## 2022-01-31 DIAGNOSIS — R45851 Suicidal ideations: Secondary | ICD-10-CM | POA: Insufficient documentation

## 2022-01-31 DIAGNOSIS — Z20822 Contact with and (suspected) exposure to covid-19: Secondary | ICD-10-CM | POA: Diagnosis not present

## 2022-01-31 LAB — CBC WITH DIFFERENTIAL/PLATELET
Abs Immature Granulocytes: 0.02 10*3/uL (ref 0.00–0.07)
Basophils Absolute: 0 10*3/uL (ref 0.0–0.1)
Basophils Relative: 0 %
Eosinophils Absolute: 0.1 10*3/uL (ref 0.0–0.5)
Eosinophils Relative: 1 %
HCT: 34.9 % — ABNORMAL LOW (ref 39.0–52.0)
Hemoglobin: 12.2 g/dL — ABNORMAL LOW (ref 13.0–17.0)
Immature Granulocytes: 0 %
Lymphocytes Relative: 26 %
Lymphs Abs: 1.9 10*3/uL (ref 0.7–4.0)
MCH: 27.1 pg (ref 26.0–34.0)
MCHC: 35 g/dL (ref 30.0–36.0)
MCV: 77.4 fL — ABNORMAL LOW (ref 80.0–100.0)
Monocytes Absolute: 0.6 10*3/uL (ref 0.1–1.0)
Monocytes Relative: 8 %
Neutro Abs: 4.8 10*3/uL (ref 1.7–7.7)
Neutrophils Relative %: 65 %
Platelets: 330 10*3/uL (ref 150–400)
RBC: 4.51 MIL/uL (ref 4.22–5.81)
RDW: 16.4 % — ABNORMAL HIGH (ref 11.5–15.5)
WBC: 7.5 10*3/uL (ref 4.0–10.5)
nRBC: 0 % (ref 0.0–0.2)

## 2022-01-31 LAB — COMPREHENSIVE METABOLIC PANEL
ALT: 144 U/L — ABNORMAL HIGH (ref 0–44)
AST: 40 U/L (ref 15–41)
Albumin: 3.9 g/dL (ref 3.5–5.0)
Alkaline Phosphatase: 69 U/L (ref 38–126)
Anion gap: 7 (ref 5–15)
BUN: 10 mg/dL (ref 6–20)
CO2: 27 mmol/L (ref 22–32)
Calcium: 9.7 mg/dL (ref 8.9–10.3)
Chloride: 107 mmol/L (ref 98–111)
Creatinine, Ser: 1.13 mg/dL (ref 0.61–1.24)
GFR, Estimated: >60 mL/min (ref >60)
Glucose, Bld: 72 mg/dL (ref 70–99)
Potassium: 4 mmol/L (ref 3.5–5.1)
Sodium: 141 mmol/L (ref 135–145)
Total Bilirubin: 0.6 mg/dL (ref 0.3–1.2)
Total Protein: 7.2 g/dL (ref 6.5–8.1)

## 2022-01-31 LAB — ETHANOL: Alcohol, Ethyl (B): <10 mg/dL (ref <10)

## 2022-01-31 LAB — RAPID URINE DRUG SCREEN, HOSP PERFORMED
Amphetamines: POSITIVE — AB
Barbiturates: NOT DETECTED
Benzodiazepines: NOT DETECTED
Cocaine: POSITIVE — AB
Opiates: POSITIVE — AB
Tetrahydrocannabinol: NOT DETECTED

## 2022-01-31 LAB — ACETAMINOPHEN LEVEL: Acetaminophen (Tylenol), Serum: <10 ug/mL — ABNORMAL LOW (ref 10–30)

## 2022-01-31 LAB — CK: Total CK: 347 U/L (ref 49–397)

## 2022-01-31 LAB — SALICYLATE LEVEL: Salicylate Lvl: <7 mg/dL — ABNORMAL LOW (ref 7.0–30.0)

## 2022-01-31 LAB — RESP PANEL BY RT-PCR (FLU A&B, COVID) ARPGX2
Influenza A by PCR: NEGATIVE
Influenza B by PCR: NEGATIVE
SARS Coronavirus 2 by RT PCR: NEGATIVE

## 2022-01-31 NOTE — ED Provider Notes (Signed)
? ?Emergency Department Provider Note ? ? ?I have reviewed the triage vital signs and the nursing notes. ? ? ?HISTORY ? ?Chief Complaint ?No chief complaint on file. ? ? ?HPI ?Tristan [name] is a 36 y.o. male with past history of bipolar and hypertension presents emergency department for evaluation of psychiatry evaluation.  Patient tells me that he is hearing voices which are telling him that he should die.  He is not having thoughts of hurting other people.  He does not have a particular plan but apparently in conversation with behavioral health earlier this morning was up on a roof someplace.  He states he is no longer thinking of harming himself but given his symptoms earlier presents this evening for evaluation.  ? ? ?Past Medical History:  ?Diagnosis Date  ? Bipolar 1 disorder (HCC)   ? Hepatitis C   ? Hypertension   ? PTSD (post-traumatic stress disorder)   ? ? ?Review of Systems ? ?Constitutional: No fever/chills ?Eyes: No visual changes. ?ENT: No sore throat. ?Cardiovascular: Denies chest pain. ?Respiratory: Denies shortness of breath. ?Gastrointestinal: No abdominal pain.  No nausea, no vomiting.  No diarrhea.  No constipation. ?Genitourinary: Negative for dysuria. ?Musculoskeletal: Negative for back pain. ?Skin: Negative for rash. ?Neurological: Negative for focal weakness or numbness. Positive mild HA. ?Psychiatric: SI earlier this AM.  ? ? ?____________________________________________ ? ? ?PHYSICAL EXAM: ? ?VITAL SIGNS: ?ED Triage Vitals [01/31/22 2211]  ?Enc Vitals Group  ?   BP 134/78  ?   Pulse Rate 79  ?   Resp 19  ?   Temp 98.7 ?F (37.1 ?C)  ?   Temp Source Oral  ?   SpO2 99 %  ?   Weight 178 lb 9.2 oz (81 kg)  ? ?Constitutional: Alert and oriented.  Patient somewhat scattered in his thought process. ?Eyes: Conjunctivae are normal.  ?Head: Atraumatic. ?Nose: No congestion/rhinnorhea. ?Mouth/Throat: Mucous membranes are moist.   ?Neck: No stridor.  ?Cardiovascular: Normal rate, regular  rhythm. Good peripheral circulation. Grossly normal heart sounds.   ?Respiratory: Normal respiratory effort.  No retractions. Lungs CTAB. ?Gastrointestinal: Soft and nontender. No distention.  ?Musculoskeletal: No lower extremity tenderness nor edema. No gross deformities of extremities. ?Neurologic:  Normal speech and language. No gross focal neurologic deficits are appreciated.  ?Skin:  Skin is warm, dry and intact. No rash noted. ? ? ?____________________________________________ ?  ?LABS ?(all labs ordered are listed, but only abnormal results are displayed) ? ?Labs Reviewed  ?COMPREHENSIVE METABOLIC PANEL - Abnormal; Notable for the following components:  ?    Result Value  ? ALT 144 (*)   ? All other components within normal limits  ?RAPID URINE DRUG SCREEN, HOSP PERFORMED - Abnormal; Notable for the following components:  ? Opiates POSITIVE (*)   ? Cocaine POSITIVE (*)   ? Amphetamines POSITIVE (*)   ? All other components within normal limits  ?CBC WITH DIFFERENTIAL/PLATELET - Abnormal; Notable for the following components:  ? Hemoglobin 12.2 (*)   ? HCT 34.9 (*)   ? MCV 77.4 (*)   ? RDW 16.4 (*)   ? All other components within normal limits  ?ACETAMINOPHEN LEVEL - Abnormal; Notable for the following components:  ? Acetaminophen (Tylenol), Serum <10 (*)   ? All other components within normal limits  ?SALICYLATE LEVEL - Abnormal; Notable for the following components:  ? Salicylate Lvl <7.0 (*)   ? All other components within normal limits  ?RESP PANEL BY RT-PCR (FLU A&B, COVID) [id number]  ?  ETHANOL  ?CK  ? ? ?____________________________________________ ? ? ?PROCEDURES ? ?Procedure(s) performed:  ? ?Procedures ? ?None  ?____________________________________________ ? ? ?INITIAL IMPRESSION / ASSESSMENT AND PLAN / ED COURSE ? ?Pertinent labs & imaging results that were available during my care of the patient were reviewed by me and considered in my medical decision making (see chart for details). ?  ?This patient is  Presenting for Evaluation of psychiatry evaluation, which does require a range of treatment options, and is a complaint that involves a high risk of morbidity and mortality. ? ?The Differential Diagnoses includes but is not exclusive to alcohol, illicit or prescription medications, intracranial pathology such as stroke, intracerebral hemorrhage, fever or infectious causes including sepsis, hypoxemia, uremia, trauma, endocrine related disorders such as diabetes, hypoglycemia, thyroid-related diseases, etc. ? ?Lab work shows CK is returned to normal limits.  No acute kidney injury.  Mild anemia. ? ?Social Determinants of Health Risk patient currently homeless.  ? ?Consult complete with TTS for safety planning.  ? ?Medical Decision Making: Summary:  ?Patient presents to the emergency department for evaluation of psychiatry evaluation.  Describes some hallucinations as well as suicidal ideation earlier although declines this treatment currently. Will repeat labs and have TTS re-evaluate.  ? ?Patient is medically clear for TTS evaluation.  ? ?Disposition: pending ? ?____________________________________________ ? ?FINAL CLINICAL IMPRESSION(S) / ED DIAGNOSES ? ?Final diagnoses:  ?Suicidal ideation  ? ?Note:  This document was prepared using Dragon voice recognition software and may include unintentional dictation errors. ? ?Alona Bene, MD, FACEP ?Emergency Medicine ? ?  ?Maia Plan, MD ?01/31/22 2337 ? ?

## 2022-01-31 NOTE — ED Provider Triage Note (Signed)
Emergency Medicine Provider Triage Evaluation Note ? ?Tristan [name] , a 36 y.o. male  was evaluated in triage.  Pt complains of Hallucinations and suicidal ideations.  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  14th been hearing voices that are telling him to die or to kill himself.  He reports these hallucinations have been becoming increasingly persistent and now he is starting to think he needs to act on these.  He also reports he is having auditory hallucinations.  Patient was seen and discharged from the hospital yesterday morning for substance abuse at that time did not report hallucinations or suicidal ideations.  Patient with no focal medical complaints.  Denies substance abuse prior to arrival. ? ?Review of Systems  ?Positive: Visual and auditory hallucinations, suicidal ideations ?Negative: Homicidal ideations, chest pain, shortness of breath, abdominal pain, vomiting ? ?Physical Exam  ?BP 134/78 (BP Location: Right Arm)   Pulse 79   Temp 98.7 ?F (37.1 ?C) (Oral)   Resp 19   Wt 81 kg   SpO2 99%   BMI 25.62 kg/m?  ?Gen:   Awake, no distress   ?Resp:  Normal effort  ?MSK:   Moves extremities without difficulty  ?Other:   ? ?Medical Decision Making  ?Medically screening exam initiated at 10:14 PM.  Appropriate orders placed.  Tristan [name] was informed that the remainder of the evaluation will be completed by another provider, this initial triage assessment does not replace that evaluation, and the importance of remaining in the ED until their evaluation is complete. ? ?Medical clearance labs ordered ?  ?Dartha Lodge, PA-C ?01/31/22 2220 ? ?

## 2022-01-31 NOTE — ED Triage Notes (Signed)
Endorses hallucinations (auditory, "I'm hearing death talk to me", voices tell him to die or kill himself or threaten to kill him) and visual starting 17 days ago. States that he found himself standing on a roof and called behavioral health services. Was told to present to ER. ? ?Was recently treated in ER for overdose yesterday.  ? ?Endorses HA. ? ?Endorses SI ?

## 2022-02-01 ENCOUNTER — Ambulatory Visit (HOSPITAL_COMMUNITY)
Admission: EM | Admit: 2022-02-01 | Discharge: 2022-02-02 | Disposition: A | Discharge status: Home / Self Care | Payer: PRIVATE HEALTH INSURANCE | Attending: Psychiatry | Admitting: Psychiatry

## 2022-02-01 DIAGNOSIS — F1910 Other psychoactive substance abuse, uncomplicated: Secondary | ICD-10-CM | POA: Diagnosis not present

## 2022-02-01 DIAGNOSIS — R45851 Suicidal ideations: Secondary | ICD-10-CM | POA: Diagnosis not present

## 2022-02-01 MED ORDER — LORATADINE 10 MG PO TABS: 10.0000 mg | ORAL_TABLET | Freq: Every day | ORAL | Status: DC | PRN

## 2022-02-01 MED ORDER — HYDROXYZINE HCL 25 MG PO TABS
25.0000 mg | ORAL_TABLET | Freq: Three times a day (TID) | ORAL | Status: DC | PRN
  Administered 2022-02-01 – 2022-02-02 (×2): 25 mg via ORAL
  Filled 2022-02-01 (×2): qty 1

## 2022-02-01 MED ORDER — ACETAMINOPHEN 325 MG PO TABS: 650.0000 mg | ORAL_TABLET | Freq: Four times a day (QID) | ORAL | Status: DC | PRN

## 2022-02-01 MED ORDER — PANTOPRAZOLE SODIUM 40 MG PO TBEC: 40.0000 mg | DELAYED_RELEASE_TABLET | Freq: Every day | ORAL | Status: DC

## 2022-02-01 MED ORDER — LORAZEPAM 1 MG PO TABS: 1.0000 mg | ORAL_TABLET | ORAL | Status: DC | PRN

## 2022-02-01 MED ORDER — ZIPRASIDONE MESYLATE 20 MG IM SOLR: 20.0000 mg | INTRAMUSCULAR | Status: DC | PRN

## 2022-02-01 MED ORDER — QUETIAPINE FUMARATE 100 MG PO TABS
100.0000 mg | ORAL_TABLET | Freq: Every day | ORAL | Status: DC
Start: 2022-02-01 — End: 2022-02-01
  Administered 2022-02-01: 100 mg via ORAL
  Filled 2022-02-01: qty 1

## 2022-02-01 MED ORDER — TRAZODONE HCL 50 MG PO TABS
50.0000 mg | ORAL_TABLET | Freq: Every evening | ORAL | Status: DC | PRN
  Filled 2022-02-01: qty 1

## 2022-02-01 MED ORDER — GABAPENTIN 100 MG PO CAPS
100.0000 mg | ORAL_CAPSULE | Freq: Three times a day (TID) | ORAL | Status: DC
  Administered 2022-02-01 – 2022-02-02 (×4): 100 mg via ORAL
  Filled 2022-02-01 (×5): qty 1

## 2022-02-01 MED ORDER — ADULT MULTIVITAMIN W/MINERALS CH
1.0000 | ORAL_TABLET | Freq: Every day | ORAL | Status: DC
  Administered 2022-02-01 – 2022-02-02 (×2): 1 via ORAL
  Filled 2022-02-01 (×2): qty 1

## 2022-02-01 MED ORDER — PANTOPRAZOLE SODIUM 40 MG PO TBEC
40.0000 mg | DELAYED_RELEASE_TABLET | Freq: Every day | ORAL | Status: DC
  Administered 2022-02-01 – 2022-02-02 (×2): 40 mg via ORAL
  Filled 2022-02-01 (×2): qty 1

## 2022-02-01 MED ORDER — ADULT MULTIVITAMIN W/MINERALS CH: 1.0000 | ORAL_TABLET | Freq: Every day | ORAL | Status: DC

## 2022-02-01 MED ORDER — THIAMINE HCL 100 MG PO TABS: 100.0000 mg | ORAL_TABLET | Freq: Every day | ORAL | Status: DC

## 2022-02-01 MED ORDER — HYDROXYZINE HCL 25 MG PO TABS: 25.0000 mg | ORAL_TABLET | Freq: Three times a day (TID) | ORAL | Status: DC | PRN

## 2022-02-01 MED ORDER — GABAPENTIN 100 MG PO CAPS
100.0000 mg | ORAL_CAPSULE | Freq: Three times a day (TID) | ORAL | Status: DC
  Administered 2022-02-01: 100 mg via ORAL
  Filled 2022-02-01: qty 1

## 2022-02-01 MED ORDER — OLANZAPINE 10 MG PO TBDP: 10.0000 mg | ORAL_TABLET | Freq: Three times a day (TID) | ORAL | Status: DC | PRN

## 2022-02-01 MED ORDER — MAGNESIUM HYDROXIDE 400 MG/5ML PO SUSP: 30.0000 mL | Freq: Every day | ORAL | Status: DC | PRN

## 2022-02-01 MED ORDER — ESCITALOPRAM OXALATE 10 MG PO TABS
10.0000 mg | ORAL_TABLET | Freq: Every day | ORAL | Status: DC
  Administered 2022-02-01 – 2022-02-02 (×2): 10 mg via ORAL
  Filled 2022-02-01 (×2): qty 1

## 2022-02-01 MED ORDER — OLANZAPINE 5 MG PO TBDP
5.0000 mg | ORAL_TABLET | Freq: Once | ORAL | Status: AC | PRN
  Administered 2022-02-01: 5 mg via ORAL
  Filled 2022-02-01: qty 1

## 2022-02-01 MED ORDER — ALUM & MAG HYDROXIDE-SIMETH 200-200-20 MG/5ML PO SUSP: 30.0000 mL | ORAL | Status: DC | PRN

## 2022-02-01 MED ORDER — THIAMINE HCL 100 MG PO TABS
100.0000 mg | ORAL_TABLET | Freq: Every day | ORAL | Status: DC
  Administered 2022-02-01 – 2022-02-02 (×2): 100 mg via ORAL
  Filled 2022-02-01 (×2): qty 1

## 2022-02-01 MED ORDER — QUETIAPINE FUMARATE 100 MG PO TABS
100.0000 mg | ORAL_TABLET | Freq: Every day | ORAL | Status: DC
  Administered 2022-02-01: 100 mg via ORAL
  Filled 2022-02-01 (×2): qty 1

## 2022-02-01 MED ORDER — ESCITALOPRAM OXALATE 10 MG PO TABS: 10.0000 mg | ORAL_TABLET | Freq: Every day | ORAL | Status: DC

## 2022-02-01 MED ORDER — NICOTINE POLACRILEX 2 MG MT GUM
2.0000 mg | CHEWING_GUM | OROMUCOSAL | Status: DC | PRN
  Filled 2022-02-01: qty 1

## 2022-02-01 NOTE — ED Notes (Signed)
Spoke to [employer] at [employer]; they will be here soon. ?It will be a gray SUV and they will pull up at the ambulance bay. Gave PM phone number to call on arrival. ?

## 2022-02-01 NOTE — ED Provider Notes (Signed)
[facility] Continuous Assessment Admission H&P ? ?Date: 02/01/22 ?Patient Name: Tristan [name] ?MRN: [id number] ?Chief Complaint: No chief complaint on file. ?   ?Diagnoses:  ?Final diagnoses:  ?Suicidal ideation  ?Substance abuse ([place])  ? ?HPI: Pt presents voluntarily to [facility] behavioral health as a transfer from ED. Pt is assessed face-to-face by nurse practitioner.  ? ?Per chart review, hx of bipolar 1 disorder, PTSD, MDD, polysubstance abuse. Pt has had 18 ED visits in the past 6 months.  ? ?Pt reports current depressed, anxious mood. Reports fair appetite. States he has been awake for the past 30 hours due to substance use. Pt reports use of multiple substances 2 days ago, including amphetamines, opiates, and crack/cocaine.  ? ?Reports active SI, with thoughts "I want to end it all", "everybody would be better off without me". States he has a plan and intent to kill himself by using a crossbow. Denies access to a crossbow.  ? ?Reports hx of multiple SAs and inpatient hospitalizations. Reports last SA was earlier this week, attempted to OD on heroin.  ? ?Reports he is currently experiencing AVH. AH "telling people to kill me". VH of "evil spirits". Reports paranoia "people want to hurt me". Reports passive HI, without plan, intent, or identified individual.  ? ?Denies he is currently connected w/ counseling or medication management.  ? ?Pt is a&ox3. Appears disheveled. Eye contact is minimal. Speech is slow and slurred with decreased volume. Reported mood is depressed, anxious. Affect is congruent, blunt. TP is coherent, linear. Description of associations is intact. TC is logical. Does not appear to actively be responding to internal stimuli. Pt is falling asleep during assessment, and requires multiple prompts to answer assessment questions.  ? ?Pt admitted to continuous assessment. ? ?PHQ 2-9:  ?Flowsheet Row ED from 01/07/2022 in [facility] ED from  11/21/2021 in [facility]  ?Thoughts that you would be better off dead, or of hurting yourself in some way Several days More than half the days  ?PHQ-9 Total Score 9 16  ? ?  ?  ?[place] ED from 01/31/2022 in [place] ED from 01/29/2022 in [place] DEPT ED from 01/28/2022 in [place] DEPT  ?C-SSRS RISK CATEGORY Moderate Risk Error: Q3, 4, or 5 should not be populated when Q2 is No Error: Question 6 not populated  ? ?  ?  ? ?Total Time spent with patient: 15 minutes ? ?Musculoskeletal  ?Strength & Muscle Tone: within normal limits ?Gait & Station: normal ?Patient leans: N/A ? ?Psychiatric Specialty Exam  ?Presentation ?General Appearance: Disheveled (dressed in hospital scrubs) ? ?Eye Contact:Minimal ? ?Speech:Slow; Slurred ? ?Speech Volume:Decreased ? ?Handedness:Right ? ?Mood and Affect  ?Mood:Depressed; Anxious ? ?Affect:Congruent; Blunt ? ?Thought Process  ?Thought Processes:Coherent; Linear ? ?Descriptions of Associations:Intact ? ?Orientation:Full (Time, Place and Person) ? ?Thought Content:Logical ? Diagnosis of Schizophrenia or Schizoaffective disorder in past: No ? Duration of Psychotic Symptoms: Greater than six months ? ?Hallucinations:Hallucinations: Visual; Auditory ?Description of Auditory Hallucinations: reports hearing voices "telling people to kill me" ?Description of Visual Hallucinations: reports seeing "evil spirits" ? ?Ideas of Reference:Paranoia ? ?Suicidal Thoughts:Suicidal Thoughts: Yes, Active ?SI Active Intent and/or Plan: With Plan; With Intent; Without Means to Carry Out ? ?Homicidal Thoughts:Homicidal Thoughts: Yes, Passive ? ?Sensorium  ?Memory:Immediate Fair; Recent Poor; Remote Poor ? ?Judgment:Impaired ? ?Insight:Shallow ? ?Executive Functions  ?Concentration:Fair ? ?Attention Span:Fair ? ?Recall:Poor ? ?Fund  of  Knowledge:Fair ? ?Language:Fair ? ?Psychomotor Activity  ?Psychomotor Activity:Psychomotor Activity: Decreased ? ?Assets  ?Assets:Communication Skills; Desire for Improvement ? ?Sleep  ?Sleep:Sleep: Poor ? ?Nutritional Assessment (For OBS and FBC admissions only) ?Has the patient had a weight loss or gain of 10 pounds or more in the last 3 months?: Yes ?Has the patient had a decrease in food intake/or appetite?: Yes ?Does the patient have dental problems?: No ?Does the patient have eating habits or behaviors that may be indicators of an eating disorder including binging or inducing vomiting?: No ?Has the patient recently lost weight without trying?: 1 ?Has the patient been eating poorly because of a decreased appetite?: 0 ?Malnutrition Screening Tool Score: 1 ? ? ?Physical Exam ?Cardiovascular:  ?   Rate and Rhythm: Normal rate.  ?Pulmonary:  ?   Effort: Pulmonary effort is normal.  ?Neurological:  ?   Mental Status: He is oriented to person, place, and time.  ?Psychiatric:     ?   Attention and Perception: He is inattentive. He perceives auditory and visual hallucinations.     ?   Mood and Affect: Mood is anxious and depressed. Affect is blunt.     ?   Speech: Speech is slurred.     ?   Behavior: Behavior is cooperative.     ?   Thought Content: Thought content includes suicidal ideation. Thought content includes suicidal plan.     ?   Cognition and Memory: Cognition is impaired. Memory is impaired.  ? ?Review of Systems  ?Constitutional:  Negative for chills and fever.  ?Respiratory:  Negative for shortness of breath.   ?Cardiovascular:  Negative for chest pain and palpitations.  ?Gastrointestinal:  Negative for abdominal pain.  ?Psychiatric/Behavioral:  Positive for depression, hallucinations, substance abuse and suicidal ideas.   ? ?Pulse 60, temperature 98.3 ?F (36.8 ?C), temperature source Oral, resp. rate 16, SpO2 97 %. There is no height or weight on file to calculate BMI. ? ?Past Psychiatric History: Per  chart review, hx of bipolar 1 disorder, PTSD, MDD, polysubstance abuse ? ?Is the patient at risk to self? Yes  ?Has the patient been a risk to self in the past 6 months? Yes .    ?Has the patient been a risk to self within the distant past? Yes   ?Is the patient a risk to others? No   ?Has the patient been a risk to others in the past 6 months? No   ?Has the patient been a risk to others within the distant past? No  ? ?Past Medical History:  ?Past Medical History:  ?Diagnosis Date  ? Bipolar 1 disorder ([place])   ? Hepatitis C   ? Hypertension   ? PTSD (post-traumatic stress disorder)   ?  ?Past Surgical History:  ?Procedure Laterality Date  ? NO PAST SURGERIES    ? ? ?Family History:  ?Family History  ?Problem Relation Age of Onset  ? Other Father   ? Psychiatric Illness Father   ? ? ?Social History:  ?Social History  ? ?Socioeconomic History  ? Marital status: Single  ?  Spouse name: Not on file  ? Number of children: Not on file  ? Years of education: Not on file  ? Highest education level: Not on file  ?Occupational History  ? Not on file  ?Tobacco Use  ? Smoking status: Every Day  ?  Packs/day: 0.50  ?  Types: Cigarettes  ? Smokeless tobacco: Never  ?Vaping Use  ?  Vaping Use: Never used  ?Substance and Sexual Activity  ? Alcohol use: Yes  ?  Comment: Pint per day  ? Drug use: Yes  ?  Types: Marijuana, Heroin, Cocaine  ?  Comment: denies drug use  ? Sexual activity: Not Currently  ?Other Topics Concern  ? Not on file  ?Social History Narrative  ? ** Merged History Encounter **  ?    ? ?Social Determinants of Health  ? ?Financial Resource Strain: Not on file  ?Food Insecurity: Not on file  ?Transportation Needs: Not on file  ?Physical Activity: Not on file  ?Stress: Not on file  ?Social Connections: Not on file  ?Intimate Partner Violence: Not on file  ? ? ?SDOH:  ?SDOH Screenings  ? ?Alcohol Screen: Medium Risk  ? Last Alcohol Screening Score (AUDIT): 20  ?Depression (PHQ2-9): Medium Risk  ? PHQ-2 Score: 9   ?Financial Resource Strain: Not on file  ?Food Insecurity: Not on file  ?Housing: Not on file  ?Physical Activity: Not on file  ?Social Connections: Not on file  ?Stress: Not on file  ?Tobacco Use: High Risk  ? Smoking Tobacco Use: Every Day  ? Smokeless Tob

## 2022-02-01 NOTE — ED Notes (Signed)
Patient was disoriented and disorganized upon arrival.  He appeared to be responding to internal stimuli and his gait was unsteady.  He was not able to verbalize thoughts and appeared intoxicated however it was ascertained he has not used in 2 days.  Patient continued same presentation on unit however he has slowly shown improvement with improved gait, speech and organization.  He remains awake and has not acted on the suggestion he get some rest.  Patient has been given food and has made phone calls.  No current evidence of psychosis.  Will continue to monitor andprovide safety of theunit.   ?

## 2022-02-01 NOTE — ED Notes (Signed)
Pt A&O x 4, no distress at present, meal given.Calm & cooperative.  Monitoring for safety. ?

## 2022-02-01 NOTE — ED Notes (Signed)
Report given Miguel Rota RN Stephen. ?

## 2022-02-01 NOTE — ED Notes (Signed)
Pt is awake, pacing and talking to himself. Will continue to monitor for safety. ?

## 2022-02-01 NOTE — ED Notes (Signed)
Pt took a shower 

## 2022-02-01 NOTE — ED Notes (Signed)
Patient dressed in burgundy scrubs and belongings at nurses desk x3 bags with multiple items of clothing and shoes. ?

## 2022-02-01 NOTE — ED Notes (Signed)
Mother called. I told mother we could not say whether patient  was here or not but if patient is here - I will give message to him. Patient refused to call mom ? ?

## 2022-02-01 NOTE — Progress Notes (Signed)
Patient is restless and remains paranoid believing he is a burden and that people are talking about him.  Patient reports hearing voices.  He accepts reassurance and verbal support.  Patient is disorganized and at times appears confused.  Patient stated to writer, "just give mea gun so I can kill myself."  Writer provided emotional support.  No attempts at self harm.   ?

## 2022-02-01 NOTE — BH Assessment (Signed)
Comprehensive Clinical Assessment (CCA) Note ? ?02/01/2022 ?Artice Bergerson Hellwig ?[id number] ? ?DISPOSITION: Gave clinical report to Nira Conn, FNP who recommends Pt be transferred to [facility] for continuous assessment. Notified Marzetta Board, [facility], who approved transfer. Notified Dr Marily Memos and Rennis Chris, RN of recommendation via secure message. ? ?The patient demonstrates the following risk factors for suicide: Chronic risk factors for suicide include: psychiatric disorder of bipolar disorder, substance use disorder, and previous suicide attempts by threatening to jump from a bridge . Acute risk factors for suicide include: unemployment, social withdrawal/isolation, loss (financial, interpersonal, professional), and recent discharge from inpatient psychiatry. Protective factors for this patient include: responsibility to others (children, family). Considering these factors, the overall suicide risk at this point appears to be high. Patient is not appropriate for outpatient follow up. ? ?Flowsheet Row ED from 01/31/2022 in [facility] EMERGENCY DEPARTMENT ED from 01/29/2022 in [facility] from 01/28/2022 in [facility]  ?C-SSRS RISK CATEGORY Moderate Risk Error: Q3, 4, or 5 should not be populated when Q2 is No Error: Question 6 not populated  ? ?  ? ?Pt is a 36 year old male with past psychiatric history of bipolar 1 disorder, polysubstance abuse, substance-induced mood disorder, heroin use disorder-severe dependence,  who initially presented to [facility] ED due to altered mental status, suicidal ideation, and auditory/visual hallucinations. Pt was inpatient at [facility] 04/26-05/10/2021 and says he stopped taking prescribed psychiatric medications a few days after discharge. He reports auditory hallucinations of voices telling him to kill himself. When asked if he has a plan, Pt replies "several" and says he was standing on  a roof today. Pt's medical record indicates a history of previous suicide attempts. He reports seeing "evil people" and looks about the room during assessment. He says he has thoughts of harming "evil people" with no plan or intent. He reports poor sleep, poor appetite, irritability, decreased concentration. Pt report he has been using an unknown amount of heroin, cocaine, methamphetamines, and cannabis. Pt's urine drug screen is positive for opiates, cocaine, and amphetamines. ? ?Pt acknowledges he is homeless. Medical record indicates he has presented to local EDs almost daily. He cannot identify any family or friends who are supportive. He denies legal problems. He denies access to firearms. ? ?Pt appears psychotic and is a poor historian. Pt is disheveled and dressed in hospital scrubs. He is alert and oriented to person, place and situation. Pt speaks in a mumbled tone, at moderate volume and normal pace. Motor behavior appears restless with Pt standing, looking about the room. Eye contact is staring when he is not looking elsewhere. Pt's mood is depressed, anxious, and suspicious. Affect is congruent with mood. Thought process is circumstantial. Pt's insight is poor and judgment is impaired. He says he is willing to sign voluntarily into a psychiatric facility. ? ? ?Chief Complaint:  ?Chief Complaint  ?Patient presents with  ? Suicidal  ? ?Visit Diagnosis:  ?F31.5 Bipolar I disorder, Current or most recent episode depressed, With psychotic features ?F15.20 Amphetamine-type substance use disorder, Severe ?F14.20 Cocaine use disorder, Severe ?F11.20 Opioid use disorder, Severe ? ?CCA Screening, Triage and Referral (STR) ? ?Patient Reported Information ?How did you hear about [place]? Self ? ?What Is the Reason for Your Visit/Call Today? Pt has diagnosis of bipolar disorder and a history of substance abuse. He was recently discharged from [facility]. He is using meth, opiates, and cannabis. He appears psychotic and says he  is hearing voices telling him to kill himself and says he see evil people. ? ?How Long Has This Been Causing You Problems? 1-6 months ? ?What Do You Feel Would Help You the Most Today? Alcohol or Drug Use Treatment; Treatment for Depression or other mood problem; Medication(s) ? ? ?Have You Recently Had Any Thoughts About Hurting Yourself? Yes ? ?Are You Planning to Commit Suicide/Harm Yourself At This time? Yes ? ? ?Have you Recently Had Thoughts About Hurting Someone Karolee Ohslse? Yes ? ?Are You Planning to Harm Someone at This Time? No ? ?Explanation: No data recorded ? ?Have You Used Any Alcohol or Drugs in the Past 24 Hours? Yes ? ?How Long Ago Did You Use Drugs or Alcohol? No data recorded ?What Did You Use and How Much? Pt reports using an unknown quantity of heroin, methamphetamines, and cannabis. ? ? ?Do You Currently Have a Therapist/Psychiatrist? No ? ?Name of Therapist/Psychiatrist: No data recorded ? ?Have You Been Recently Discharged From Any Office Practice or Programs? Yes ? ?Explanation of Discharge From Practice/Program: [facility] ? ? ?  ?CCA Screening Triage Referral Assessment ?Type of Contact: Tele-Assessment ? ?Telemedicine Service Delivery: Telemedicine service delivery: This service was provided via telemedicine using a 2-way, interactive audio and video technology ? ?Is this Initial or Reassessment? Initial Assessment ? ?Date Telepsych consult ordered in [facility]:  01/31/22 ? ?Time Telepsych consult ordered in [facility]:  2218 ? ?[facility] ? ?[facility] ? ? ?Collateral Involvement: Medical record ? ? ?Does Patient Have a Automotive engineerCourt Appointed Legal Guardian? No data recorded ?Name and Contact of Legal Guardian: No data recorded ?If Minor and Not Living with Parent(s), Who has Custody? NA ? ?Is CPS involved or ever been involved? Never ? ?Is APS involved or ever been involved? Never ? ? ?Patient Determined To Be At Risk for Harm To Self  or Others Based on Review of Patient Reported Information or Presenting Complaint? Yes, for Self-Harm ? ?Method: No data recorded ?Availability of Means: No data recorded ?Intent: No data recorded ?Notification Required: No data recorded ?Additional Information for Danger to Others Potential: No data recorded ?Additional Comments for Danger to Others Potential: No data recorded ?Are There Guns or Other Weapons in Your Home? No data recorded ?Types of Guns/Weapons: No data recorded ?Are These Weapons Safely Secured?                            No data recorded ?Who Could Verify You Are Able To Have These Secured: No data recorded ?Do You Have any Outstanding Charges, Pending Court Dates, Parole/Probation? No data recorded ?Contacted To Inform of Risk of Harm To Self or Others: Unable to Contact: ? ? ? ?Does Patient Present under Involuntary Commitment? No ? ?IVC Papers Initial File Date: No data recorded ? [place] ? ? ?Patient Currently Receiving the Following Services: Not Receiving Services ? ? ?Determination of Need: Urgent (48 hours) ? ? ?Options For Referral: Inpatient Hospitalization; Outpatient Therapy; Medication Management ? ? ? ? ?CCA Biopsychosocial ?Patient Reported Schizophrenia/Schizoaffective Diagnosis in Past: No ? ? ?Strengths: uta ? ? ?Mental Health Symptoms ?Depression:   ?Change in energy/activity; Difficulty Concentrating; Fatigue; Increase/decrease in appetite; Irritability; Sleep (too much or little) ?  ?Duration of Depressive symptoms:  ?Duration of Depressive Symptoms: Greater than two weeks ?  ?Mania:   ?Change in energy/activity; Irritability; Racing  thoughts; Recklessness ?  ?Anxiety:    ?Worrying; Tension; Sleep; Restlessness; Irritability; Difficulty concentrating ?  ?Psychosis:   ?Hallucinations ?  ?Duration of Psychotic symptoms:  ?Duration of Psychotic Symptoms: Greater than six months ?  ?Trauma:   ?None ?  ?Obsessions:   ?None ?   ?Compulsions:   ?None ?  ?Inattention:   ?N/A ?  ?Hyperactivity/Impulsivity:   ?N/A ?  ?Oppositional/Defiant Behaviors:   ?N/A ?  ?Emotional Irregularity:   ?Mood lability; Potentially harmful impulsiv

## 2022-02-02 DIAGNOSIS — R45851 Suicidal ideations: Secondary | ICD-10-CM

## 2022-02-02 DIAGNOSIS — F1910 Other psychoactive substance abuse, uncomplicated: Secondary | ICD-10-CM

## 2022-02-02 MED ORDER — NICOTINE 14 MG/24HR TD PT24
14.0000 mg | MEDICATED_PATCH | Freq: Every day | TRANSDERMAL | Status: DC
Start: 2022-02-02 — End: 2022-02-02
  Administered 2022-02-02: 14 mg via TRANSDERMAL
  Filled 2022-02-02: qty 1

## 2022-02-02 NOTE — ED Notes (Addendum)
Patient acting out, was complaining he wanted juice earlier because the water contains cancer? And all he wanted to drink is juice gave him one and a snack,explained he would have to wait until lunch. It is now lunch time, patient wanted a hot meal heated it up for him w/a salad then he said he wanted a p/jelly sandwich with chips and drink.Gave it to him, had to throw the meal out. Patient being difficult complaining constantly most of the day. ?

## 2022-02-02 NOTE — ED Notes (Addendum)
Patient was upset that he did not get a bus ticket to where he wanted to go in [place], so on his way to the bathroom he threatened the staff stating he will come back with a weapon he will show everyone, saying he is not as crazy as people think he is. He has been making little comments about the staff all day when he did not get what he wanted if it was a snack, breakfast or lunch.  ?

## 2022-02-02 NOTE — ED Notes (Addendum)
Patient slept in this morning but did eat breakfast. Patient continued asking for snacks and juice and became irritable when told to abide by snack times by MHT. Patient able to be redirected. Patient denies SI/HI and A/V/H with no plan/intent. Although patient denied A/V/H, patient does appear preoccupied and constantly observed talking to himself and becoming frustrated at times. Patient given atarax po prn. Patient is med compliant. Patient also appears paranoid/suspicious regarding water and snacks. Patient provided with reassurance. Patient denies pain with no observed distress at this time.  ?

## 2022-02-02 NOTE — Progress Notes (Signed)
CSW provided the following resources for the patient to utilize. It was reported by the provider that the patient is seeking long-term substance abuse treatment. ? ?Substance Abuse Resources ? ? ?[facility] ?- Admissions are currently completed Monday through Friday at 8am; both appointments and walk-ins are accepted.  Any individual that is a [facility] resident may present for a substance abuse screening and assessment for admission.  A person may be referred by numerous sources or self-refer.   Potential clients will be screened for medical necessity and appropriateness for the program.  Clients must meet criteria for high-intensity residential treatment services.  If clinically appropriate, a client will continue with the comprehensive clinical assessment and intake process, as well as enrollment in the [facility]. ? ?Address: [address] ?[place], [place] [address] ?Admin Hours: Mon-Fri 8AM to 5PM ?Center Hours: 24/7 ?Phone: [phone] ?Fax: [phone] ? ?[facility] [employer] [facility] (Detox) Facility Based Crisis:  ?These are 3 locations for services: Please call before arrival:   ? ?[facility])  ?Address: [address] [place], [place] [address] ?Phone: [phone] ? ?[facility]) ?Address: [address] [place], [place] [address] ?Phone#: [phone] ? ?[facility]) ?Address: [address] [place], [place], [place] [address] ?Phone#: [phone] ? ? ?[facility]): (offers outpatient therapy and intensive outpatient substance abuse therapy).  ?[address], [place], [place] [address] ?Phone: [phone] ? ?[facility]: ? ? Phone: [phone] ? ?[facility] ?SA Intensive Outpatient Program (SAIOP) means structured individual and group addiction activities and services that are provided at an outpatient  program designed to assist adult and adolescent consumers to begin recovery and learn skills for recovery maintenance. [facility] SAIOP program is offered at least 3 hours a day, 3 days a week. SAIOP services shall include a structured program consisting of, but not limited to, the following services: ?Individual counseling and support; Group counseling and support; Family counseling, training or support; Biochemical assays to identify recent drug use (e.g., urine drug screens); Strategies for relapse prevention to include community and social support systems in treatment; Life skills; Crisis contingency planning; Disease Management; and Treatment support activities that have been adapted or specifically designed for persons with physical disabilities, or persons with co-occurring disorders of mental illness and substance abuse/dependence or mental retardation/developmental disability and substance abuse/dependence. ? ?Phone: [phone]  ? ?[facility]) ? ?Address: [address] [place], [place], [place] [address] ?Phone: [phone] ? ? ?[facility] ?Address: [address] [place], [place] [address] ?Phone: [phone] ? ?- a combination of group and individual sessions to meet the participants needs. This allows participants to engage in treatment and remain involved in their home and work life. ?- Transitional housing places program participants in a supportive living environment while they complete a treatment program and work to secure independent housing. ?- The Substance Abuse Intensive Outpatient Treatment Program at [facility] consists of structured group sessions and individual sessions that are designed to teach participants early recovery and relapse prevention skills. ?-[facility] works with the [employer] to provide a housing and treatment program for homeless veterans.  ? ?[facility]  ? ?Address:  [address] [place], [place] [address] ?Phone#: [phone]  ? ?: Referrals to RTSA facilities can be made by [facility] and [facility].  Referrals  are also accepted from physicians, private providers, hospital emergency rooms, family members, or any person who has knowledge of someone in the need of our services. ? ?[facility] will also offer the following outpatient services: (Monday through Friday 8am-5pm) ?  ?Partial Hospitalization Program (PHP) ?Substance Abuse Intensive Outpatient Program (SA-IOP) ?Group Therapy ?Medication Management ?Peer Living Room ?We also provide (24/7):  ?Assessments: Our mental health clinician and providers will conduct a focused mental health evaluation, assessing for immediate safety concerns and further mental health needs. ?Referral: Our team will provide resources and help connect to community based mental health treatment, when indicated, including psychotherapy, psychiatry, and other specialized behavioral health or substance use disorder services (for those not already in treatment). ?Transitional Care: Our team providers in person bridging and/or telephonic follow-up during the patient's transition to outpatient services.  ? ?[facility] ?24-Hour Call Center: ?[phone] ?Behavioral Health Crisis Line: ?[phone] ? ?Crissie Reese, MSW, LCSW-A, LCAS ?Phone: [phone] ?Disposition/TOC ? ?

## 2022-02-02 NOTE — Discharge Instructions (Addendum)
Substance Abuse Resources ? ? ?[facility] ?- Admissions are currently completed Monday through Friday at 8am; both appointments and walk-ins are accepted.  Any individual that is a [place] resident may present for a substance abuse screening and assessment for admission.  A person may be referred by numerous sources or self-refer.   Potential clients will be screened for medical necessity and appropriateness for the program.  Clients must meet criteria for high-intensity residential treatment services.  If clinically appropriate, a client will continue with the comprehensive clinical assessment and intake process, as well as enrollment in the [facility]. ? ?Address: [address] ?[place], [place] [address] ?Admin Hours: Mon-Fri 8AM to 5PM ?Center Hours: 24/7 ?Phone: [phone] ?Fax: [phone] ? ?[facility] Based Crisis:  ?These are 3 locations for services: Please call before arrival:   ? ?[facility] ([facility])  ?Address: [address] , [place] [address] ?Phone: [phone] ? ?[facility]) ?Address: [address], [place], [place] [address] ?Phone#: [phone] ? ?[facility] ([facility]) ?Address: [address], [place], [place] [address] ?Phone#: [phone] ? ? ?[facility] ([facility]): (offers outpatient therapy and intensive outpatient substance abuse therapy).  ?[address], [place], [place] [address] ?Phone: [phone] ? ?[facility] ?Men's Division ?Address: [address] [place], [place] [address] ?Phone: [phone] ? ?-The [facility] provides food, shelter and other programs and services to the homeless men of [place] through our men's program. ? ?By offering safe shelter, three meals a day, clean clothing, Biblical counseling, financial planning, vocational training, GED/education and  employment assistance, we've helped mend the shattered lives of many homeless men since opening in 1974. ? ?We have approximately 267 beds available, with a max of 312 beds including mats for emergency situations and currently house an average of 270 men a night. ? ?Prospective Client Check-In Information ?Photo ID Required (State/ Out of State/ DOC) - if photo ID is not available, clients are required to have a printout of a police/sheriff's criminal history report. ?Help out with chores around the Mission. ?No sex offender of any type (pending, charged, registered and/or any other sex related offenses) will be permitted to check in. ?Must be willing to abide by all rules, regulations, and policies established by the [facility]. ?The following will be provided - shelter, food, clothing, and biblical counseling. ?If you or someone you know is in need of assistance at our men's shelter in [place], [place], please call [phone] ext. [phone]. ? ?[facility] intake hours are Monday-Friday only.  ? ?[facility]: ? ? Phone: [phone] ? ?[facility] ?SA Intensive Outpatient Program (SAIOP) means structured individual and group addiction activities and services that are provided at an outpatient program designed to assist adult and adolescent consumers to begin recovery and learn skills for recovery maintenance. [facility] SAIOP program is offered at least 3 hours a day, 3 days a week. SAIOP services shall include a structured program consisting of, but not limited to, the following services: ?Individual counseling and support; Group counseling and support; Family counseling, training or support; Biochemical assays to identify recent drug use (e.g., urine drug screens); Strategies for relapse prevention to include community and social support systems in treatment; Life skills; Crisis contingency planning; Disease Management; and Treatment  support activities that have been adapted or   specifically designed for persons with physical disabilities, or persons with co-occurring disorders of mental illness and substance abuse/dependence or mental retardation/developmental disability and substance abuse/dependence. ? ?Phone: [phone]  ? ?[facility] ? ?Address: [address], [place], [place] [address] ?Phone: [phone] ? ? ?[facility] ?Address: [address], [place], [place] [address] ?Phone: [phone] ? ?- a combination of group and individual sessions to meet the participants needs. This allows participants to engage in treatment and remain involved in their home and work life. ?- Transitional housing places program participants in a supportive living environment while they complete a treatment program and work to secure independent housing. ?- The Substance Abuse Intensive Outpatient Treatment Program at [facility] consists of structured group sessions and individual sessions that are designed to teach participants early recovery and relapse prevention skills. ?-[facility] works with the [employer] to provide a housing and treatment program for homeless veterans.  ? ?[facility]  ? ?Address: [address] [place], [place] [address] ?Phone#: [phone]  ? ?: Referrals to RTSA facilities can be made by [facility].  Referrals are also accepted from physicians, private providers, hospital emergency rooms, family members, or any person who has knowledge of someone in the need of our services. ? ?[facility] will also offer the following outpatient services: (Monday through Friday 8am-5pm) ?  ?Partial Hospitalization Program (PHP) ?Substance Abuse Intensive Outpatient Program (SA-IOP) ?Group Therapy ?Medication Management ?Peer Living Room ?We also provide (24/7):  ?Assessments: Our mental health  clinician and providers will conduct a focused mental health evaluation, assessing for immediate safety concerns and further mental health needs. ?Referral: Our team will provide resources and help connect to community based mental health treatment, when indicated, including psychotherapy, psychiatry, and other specialized behavioral health or substance use disorder services (for those not already in treatment). ?Transitional Care: Our team providers in person bridging and/or telephonic follow-up during the patient's transition to outpatient services.  ? ?[facility] ?24-Hour Call Center: ?[phone] ?Behavioral Health Crisis Line: ?[phone] ? ?

## 2022-02-02 NOTE — ED Notes (Signed)
Patient is discharging at this time. Patient A&Ox4. Patient vs stable. Patient denies SI,HI, and A/V/H at this moment. Patient currently denies any pain. Printed AVS reviewed with and given to patient along with resources and buss pass. All belongings returned. No s/s of current distress.  ?

## 2022-02-02 NOTE — ED Provider Notes (Signed)
FBC/OBS ASAP Discharge Summary ? ?Date and Time: 02/02/2022 6:04 PM  ?Name: Tristan [name]  ?MRN:  [id number]  ? ?Discharge Diagnoses:  ?Final diagnoses:  ?Suicidal ideation  ?Substance abuse (HCC)  ? ? ?Subjective:  Tristan [name] reported " I don't have anywhere to go."  ? ?Tristan [name] is a 36 year old African-American male.  He was seen and evaluated face-to-face.  He denied suicidal or homicidal ideations.  Denies auditory hallucinations.  He reports frustration regarding follow-up appointment with residential treatment facility.  States since last inpatient admission he was accepted to [facility] however due to a positive hepatitis C lab he was unable to follow-up and was kicked out of the program.  States he is seeking shelter and/or residential treatment.  NP spoke to CSW J. Reses for additional outpatient resources.  Patient to be discharged.  Support, encouragement and reassurance was provided. ? ?Stay Summary:  Per admission assessment note: Pt [facility] to [facility] as a transfer from ED. Pt is assessed face-to-face by nurse practitioner. Per chart review, hx of bipolar 1 disorder, PTSD, MDD, polysubstance abuse. Pt has had 18 ED visits in the past 6 months. Pt reports current depressed, anxious mood. Reports fair appetite. States he has been awake for the past 30 hours due to substance use. Pt reports use of multiple substances 2 days ago, including amphetamines, opiates, and crack/cocaine.  ?  ? ?Total Time spent with patient: 15 minutes ? ?Past Psychiatric History:  ?Past Medical History:  ?Past Medical History:  ?Diagnosis Date  ? Bipolar 1 disorder (HCC)   ? Hepatitis C   ? Hypertension   ? PTSD (post-traumatic stress disorder)   ?  ?Past Surgical History:  ?Procedure Laterality Date  ? NO PAST SURGERIES    ? ?Family History:  ?Family History  ?Problem Relation Age of Onset  ? Other Father   ? Psychiatric Illness Father   ? ?Family Psychiatric History:   ?Social History:  ?Social History  ? ?Substance and Sexual Activity  ?Alcohol Use Yes  ? Comment: Pint per day  ?   ?Social History  ? ?Substance and Sexual Activity  ?Drug Use Yes  ? Types: Marijuana, Heroin, Cocaine  ? Comment: denies drug use  ?  ?Social History  ? ?Socioeconomic History  ? Marital status: Single  ?  Spouse name: Not on file  ? Number of children: Not on file  ? Years of education: Not on file  ? Highest education level: Not on file  ?Occupational History  ? Not on file  ?Tobacco Use  ? Smoking status: Every Day  ?  Packs/day: 0.50  ?  Types: Cigarettes  ? Smokeless tobacco: Never  ?Vaping Use  ? Vaping Use: Never used  ?Substance and Sexual Activity  ? Alcohol use: Yes  ?  Comment: Pint per day  ? Drug use: Yes  ?  Types: Marijuana, Heroin, Cocaine  ?  Comment: denies drug use  ? Sexual activity: Not Currently  ?Other Topics Concern  ? Not on file  ?Social History Narrative  ? ** Merged History Encounter **  ?    ? ?Social Determinants of Health  ? ?Financial Resource Strain: Not on file  ?Food Insecurity: Not on file  ?Transportation Needs: Not on file  ?Physical Activity: Not on file  ?Stress: Not on file  ?Social Connections: Not on file  ? ?SDOH:  ?SDOH Screenings  ? ?Alcohol Screen: Medium Risk  ? Last Alcohol Screening Score (AUDIT): 20  ?  Depression (PHQ2-9): Medium Risk  ? PHQ-2 Score: 9  ?Financial Resource Strain: Not on file  ?Food Insecurity: Not on file  ?Housing: Not on file  ?Physical Activity: Not on file  ?Social Connections: Not on file  ?Stress: Not on file  ?Tobacco Use: High Risk  ? Smoking Tobacco Use: Every Day  ? Smokeless Tobacco Use: Never  ? Passive Exposure: Not on file  ?Transportation Needs: Not on file  ? ? ?Tobacco Cessation:  N/A, patient does not currently use tobacco products ? ?Current Medications:  ?Current Facility-Administered Medications  ?Medication Dose Route Frequency Provider Last Rate Last Admin  ? acetaminophen (TYLENOL) tablet 650 mg  650 mg Oral  Q6H PRN Lauree Chandler, NP      ? alum & mag hydroxide-simeth (MAALOX/MYLANTA) 200-200-20 MG/5ML suspension 30 mL  30 mL Oral Q4H PRN Lauree Chandler, NP      ? escitalopram (LEXAPRO) tablet 10 mg  10 mg Oral Daily Lauree Chandler, NP   10 mg at 02/02/22 1057  ? gabapentin (NEURONTIN) capsule 100 mg  100 mg Oral TID Lauree Chandler, NP   100 mg at 02/02/22 1057  ? hydrOXYzine (ATARAX) tablet 25 mg  25 mg Oral TID PRN Lauree Chandler, NP   25 mg at 02/02/22 1111  ? loratadine (CLARITIN) tablet 10 mg  10 mg Oral Daily PRN Lauree Chandler, NP      ? magnesium hydroxide (MILK OF MAGNESIA) suspension 30 mL  30 mL Oral Daily PRN Lauree Chandler, NP      ? multivitamin with minerals tablet 1 tablet  1 tablet Oral Daily Lauree Chandler, NP   1 tablet at 02/02/22 1057  ? nicotine (NICODERM CQ - dosed in mg/24 hours) patch 14 mg  14 mg Transdermal Daily Lauree Chandler, NP   14 mg at 02/02/22 1222  ? pantoprazole (PROTONIX) EC tablet 40 mg  40 mg Oral Daily Lauree Chandler, NP   40 mg at 02/02/22 1057  ? QUEtiapine (SEROQUEL) tablet 100 mg  100 mg Oral QHS Lauree Chandler, NP   100 mg at 02/01/22 2301  ? thiamine tablet 100 mg  100 mg Oral Daily Lauree Chandler, NP   100 mg at 02/02/22 1057  ? traZODone (DESYREL) tablet 50 mg  50 mg Oral QHS PRN Lauree Chandler, NP      ? ?Current Outpatient Medications  ?Medication Sig Dispense Refill  ? escitalopram (LEXAPRO) 10 MG tablet Take 1 tablet (10 mg total) by mouth daily. 30 tablet 0  ? gabapentin (NEURONTIN) 100 MG capsule Take 1 capsule (100 mg total) by mouth 3 (three) times daily. 90 capsule 0  ? hydrOXYzine (ATARAX) 25 MG tablet Take 1 tablet (25 mg total) by mouth 3 (three) times daily as needed for anxiety. 30 tablet 0  ? loratadine (CLARITIN) 10 MG tablet Take 10 mg by mouth daily as needed for allergies.    ? Multiple Vitamin (MULTIVITAMIN WITH MINERALS) TABS tablet Take 1 tablet by mouth daily. 30 tablet 0  ? nicotine  polacrilex (NICORETTE) 2 MG gum Take 1 each (2 mg total) by mouth as needed for smoking cessation. (Patient not taking: Reported on 02/01/2022) 100 tablet 0  ? pantoprazole (PROTONIX) 40 MG tablet Take 1 tablet (40 mg total) by mouth daily. 30 tablet 0  ? QUEtiapine (SEROQUEL) 100 MG tablet Take 1 tablet (100 mg total) by mouth at bedtime. 30 tablet 0  ? thiamine 100  MG tablet Take 1 tablet (100 mg total) by mouth daily. 30 tablet 0  ? ? ?PTA Medications: (Not in a hospital admission) ? ? ?Musculoskeletal  ?Strength & Muscle Tone: within normal limits ?Gait & Station: normal ?Patient leans: N/A ? ?Psychiatric Specialty Exam  ?Presentation  ?General Appearance: Disheveled (dressed in hospital scrubs) ? ?Eye Contact:Minimal ? ?Speech:Slow; Slurred ? ?Speech Volume:Decreased ? ?Handedness:Right ? ? ?Mood and Affect  ?Mood:Depressed; Anxious ? ?Affect:Congruent; Blunt ? ? ?Thought Process  ?Thought Processes:Coherent; Linear ? ?Descriptions of Associations:Intact ? ?Orientation:Full (Time, Place and Person) ? ?Thought Content:Logical ? Diagnosis of Schizophrenia or Schizoaffective disorder in past: No ? Duration of Psychotic Symptoms: Greater than six months ? ? Hallucinations:Hallucinations: Visual; Auditory ?Description of Auditory Hallucinations: reports hearing voices "telling people to kill me" ?Description of Visual Hallucinations: reports seeing "evil spirits" ? ?Ideas of Reference:Paranoia ? ?Suicidal Thoughts:Suicidal Thoughts: Yes, Active ?SI Active Intent and/or Plan: With Plan; With Intent; Without Means to Carry Out ? ?Homicidal Thoughts:Homicidal Thoughts: Yes, Passive ? ? ?Sensorium  ?Memory:Immediate Fair; Recent Poor; Remote Poor ? ?Judgment:Impaired ? ?Insight:Shallow ? ? ?Executive Functions  ?Concentration:Fair ? ?Attention Span:Fair ? ?Recall:Poor ? ?Fund of Knowledge:Fair ? ?Language:Fair ? ? ?Psychomotor Activity  ?Psychomotor Activity:Psychomotor Activity: Decreased ? ? ?Assets   ?Assets:Communication Skills; Desire for Improvement ? ? ?Sleep  ?Sleep:Sleep: Poor ? ? ?Nutritional Assessment (For OBS and FBC admissions only) ?Has the patient had a weight loss or gain of 10 pounds or more in the last 3 mon

## 2022-02-02 NOTE — ED Notes (Signed)
Pt is in the bed sleeping. Respirations is even and unlabored. No acute distress noted. Will continue to monitor for safety. 

## 2022-02-05 ENCOUNTER — Emergency Department (HOSPITAL_COMMUNITY): Payer: PRIVATE HEALTH INSURANCE

## 2022-02-05 ENCOUNTER — Emergency Department (HOSPITAL_COMMUNITY)
Admission: EM | Admit: 2022-02-05 | Discharge: 2022-02-06 | Disposition: A | Discharge status: Home / Self Care | Payer: PRIVATE HEALTH INSURANCE | Attending: Emergency Medicine | Admitting: Emergency Medicine

## 2022-02-05 DIAGNOSIS — Z79899 Other long term (current) drug therapy: Secondary | ICD-10-CM | POA: Insufficient documentation

## 2022-02-05 DIAGNOSIS — F1990 Other psychoactive substance use, unspecified, uncomplicated: Secondary | ICD-10-CM | POA: Diagnosis not present

## 2022-02-05 DIAGNOSIS — Y900 Blood alcohol level of less than 20 mg/100 ml: Secondary | ICD-10-CM | POA: Diagnosis not present

## 2022-02-05 DIAGNOSIS — I10 Essential (primary) hypertension: Secondary | ICD-10-CM | POA: Diagnosis not present

## 2022-02-05 DIAGNOSIS — R61 Generalized hyperhidrosis: Secondary | ICD-10-CM | POA: Diagnosis not present

## 2022-02-05 DIAGNOSIS — F1910 Other psychoactive substance abuse, uncomplicated: Secondary | ICD-10-CM

## 2022-02-05 DIAGNOSIS — R451 Restlessness and agitation: Secondary | ICD-10-CM | POA: Diagnosis present

## 2022-02-05 DIAGNOSIS — E860 Dehydration: Secondary | ICD-10-CM

## 2022-02-05 LAB — ETHANOL: Alcohol, Ethyl (B): <10 mg/dL (ref <10)

## 2022-02-05 LAB — COMPREHENSIVE METABOLIC PANEL
ALT: 60 U/L — ABNORMAL HIGH (ref 0–44)
AST: 44 U/L — ABNORMAL HIGH (ref 15–41)
Albumin: 3.7 g/dL (ref 3.5–5.0)
Alkaline Phosphatase: 79 U/L (ref 38–126)
Anion gap: 20 — ABNORMAL HIGH (ref 5–15)
BUN: 12 mg/dL (ref 6–20)
CO2: 14 mmol/L — ABNORMAL LOW (ref 22–32)
Calcium: 8.9 mg/dL (ref 8.9–10.3)
Chloride: 108 mmol/L (ref 98–111)
Creatinine, Ser: 1.72 mg/dL — ABNORMAL HIGH (ref 0.61–1.24)
GFR, Estimated: 52 mL/min — ABNORMAL LOW (ref >60)
Glucose, Bld: 70 mg/dL (ref 70–99)
Potassium: 3.8 mmol/L (ref 3.5–5.1)
Sodium: 142 mmol/L (ref 135–145)
Total Bilirubin: 1 mg/dL (ref 0.3–1.2)
Total Protein: 6.7 g/dL (ref 6.5–8.1)

## 2022-02-05 LAB — CBC WITH DIFFERENTIAL/PLATELET
Abs Immature Granulocytes: 0.04 10*3/uL (ref 0.00–0.07)
Basophils Absolute: 0.1 10*3/uL (ref 0.0–0.1)
Basophils Relative: 1 %
Eosinophils Absolute: 0.2 10*3/uL (ref 0.0–0.5)
Eosinophils Relative: 3 %
HCT: 35.5 % — ABNORMAL LOW (ref 39.0–52.0)
Hemoglobin: 11.8 g/dL — ABNORMAL LOW (ref 13.0–17.0)
Immature Granulocytes: 0 %
Lymphocytes Relative: 33 %
Lymphs Abs: 3 10*3/uL (ref 0.7–4.0)
MCH: 26.5 pg (ref 26.0–34.0)
MCHC: 33.2 g/dL (ref 30.0–36.0)
MCV: 79.8 fL — ABNORMAL LOW (ref 80.0–100.0)
Monocytes Absolute: 0.7 10*3/uL (ref 0.1–1.0)
Monocytes Relative: 8 %
Neutro Abs: 5 10*3/uL (ref 1.7–7.7)
Neutrophils Relative %: 55 %
Platelets: 340 10*3/uL (ref 150–400)
RBC: 4.45 MIL/uL (ref 4.22–5.81)
RDW: 15.9 % — ABNORMAL HIGH (ref 11.5–15.5)
WBC: 9 10*3/uL (ref 4.0–10.5)
nRBC: 0 % (ref 0.0–0.2)

## 2022-02-05 LAB — I-STAT VENOUS BLOOD GAS, ED
Acid-base deficit: 15 mmol/L — ABNORMAL HIGH (ref 0.0–2.0)
Acid-base deficit: 1 mmol/L (ref 0.0–2.0)
Bicarbonate: 14.3 mmol/L — ABNORMAL LOW (ref 20.0–28.0)
Bicarbonate: 24.3 mmol/L (ref 20.0–28.0)
Calcium, Ion: 1.12 mmol/L — ABNORMAL LOW (ref 1.15–1.40)
Calcium, Ion: 1.18 mmol/L (ref 1.15–1.40)
HCT: 38 % — ABNORMAL LOW (ref 39.0–52.0)
HCT: 34 % — ABNORMAL LOW (ref 39.0–52.0)
Hemoglobin: 12.9 g/dL — ABNORMAL LOW (ref 13.0–17.0)
Hemoglobin: 11.6 g/dL — ABNORMAL LOW (ref 13.0–17.0)
O2 Saturation: 99 %
O2 Saturation: 95 %
Potassium: 3.8 mmol/L (ref 3.5–5.1)
Potassium: 3.9 mmol/L (ref 3.5–5.1)
Sodium: 141 mmol/L (ref 135–145)
Sodium: 141 mmol/L (ref 135–145)
TCO2: 16 mmol/L — ABNORMAL LOW (ref 22–32)
TCO2: 26 mmol/L (ref 22–32)
pCO2, Ven: 45.8 mmHg (ref 44–60)
pCO2, Ven: 42.9 mmHg — ABNORMAL LOW (ref 44–60)
pH, Ven: 7.103 — CL (ref 7.25–7.43)
pH, Ven: 7.36 (ref 7.25–7.43)
pO2, Ven: 203 mmHg — ABNORMAL HIGH (ref 32–45)
pO2, Ven: 80 mmHg — ABNORMAL HIGH (ref 32–45)

## 2022-02-05 LAB — ACETAMINOPHEN LEVEL: Acetaminophen (Tylenol), Serum: <10 ug/mL — ABNORMAL LOW (ref 10–30)

## 2022-02-05 LAB — OSMOLALITY: Osmolality: 295 mOsm/kg (ref 275–295)

## 2022-02-05 LAB — CK: Total CK: 506 U/L — ABNORMAL HIGH (ref 49–397)

## 2022-02-05 LAB — SALICYLATE LEVEL: Salicylate Lvl: <7 mg/dL — ABNORMAL LOW (ref 7.0–30.0)

## 2022-02-05 MED ORDER — LACTATED RINGERS IV BOLUS
1000.0000 mL | Freq: Once | INTRAVENOUS | Status: AC
  Administered 2022-02-05: 1000 mL via INTRAVENOUS

## 2022-02-05 MED ORDER — SODIUM CHLORIDE 0.9 % IV BOLUS
1000.0000 mL | Freq: Once | INTRAVENOUS | Status: AC
  Administered 2022-02-05: 1000 mL via INTRAVENOUS

## 2022-02-05 MED ORDER — MIDAZOLAM HCL (PF) 5 MG/ML IJ SOLN
5.0000 mg | Freq: Once | INTRAMUSCULAR | Status: AC
  Administered 2022-02-05: 5 mg via INTRAVENOUS

## 2022-02-05 NOTE — ED Provider Notes (Addendum)
?Tristan [name] ?Provider Note ? ? ?CSN: [id number] ?Arrival date & time: 02/05/22  1928 ? ?  ? ?History ? ?No chief complaint on file. ? ? ?Tristan [name] is a 36 y.o. male. ? ? Patient as above with significant medical history as below, including bipolar 1, hepatitis C, hypertension, PTSD who presents to the ED with complaint of agitated delirium.  [place] patient was in a [place] stop bathroom throwing objects, rolling around on the floor, shouting at patrons of the truck stop.  EMS did report there was drug paraphernalia in the room including a syringe that was empty.  Patient unable to provide history secondary to profound agitation.  Patient was given 400 mg of ketamine prior to arrival, IV fluids.  ? ?Patient arrived with police and EMS ? ? ?Past Medical History: ?No date: Bipolar 1 disorder ([place]) ?No date: Hepatitis C ?No date: Hypertension ?No date: PTSD (post-traumatic stress disorder) ? ?Past Surgical History: ?No date: NO PAST SURGERIES  ? ? ?The history is provided by the EMS personnel and the police. No language interpreter was used.  ? ?  ? ?Home Medications ?Prior to Admission medications   ?Medication Sig Start Date End Date Taking? Authorizing Provider  ?escitalopram (LEXAPRO) 10 MG tablet Take 1 tablet (10 mg total) by mouth daily. 01/23/22 02/22/22  Armando Reichert, MD  ?gabapentin (NEURONTIN) 100 MG capsule Take 1 capsule (100 mg total) by mouth 3 (three) times daily. 01/22/22 02/21/22  Armando Reichert, MD  ?hydrOXYzine (ATARAX) 25 MG tablet Take 1 tablet (25 mg total) by mouth 3 (three) times daily as needed for anxiety. 01/22/22   Armando Reichert, MD  ?loratadine (CLARITIN) 10 MG tablet Take 10 mg by mouth daily as needed for allergies.    [provider]  ?Multiple Vitamin (MULTIVITAMIN WITH MINERALS) TABS tablet Take 1 tablet by mouth daily. 01/23/22   Armando Reichert, MD  ?nicotine polacrilex (NICORETTE) 2 MG gum Take 1 each (2 mg total) by mouth as needed for  smoking cessation. ?Patient not taking: Reported on 02/01/2022 01/22/22   Armando Reichert, MD  ?pantoprazole (PROTONIX) 40 MG tablet Take 1 tablet (40 mg total) by mouth daily. 01/23/22   Armando Reichert, MD  ?QUEtiapine (SEROQUEL) 100 MG tablet Take 1 tablet (100 mg total) by mouth at bedtime. 01/22/22   Armando Reichert, MD  ?thiamine 100 MG tablet Take 1 tablet (100 mg total) by mouth daily. 01/23/22   Armando Reichert, MD  ?   ? ?Allergies    ?Tomato   ? ?Review of Systems   ?Review of Systems  ?Unable to perform ROS: Mental status change  ? ?Physical Exam ?Updated Vital Signs ?BP 128/87   Pulse 71   Temp 99 ?F (37.2 ?C) (Oral)   Resp 10   SpO2 100%  ?Physical Exam ?Vitals and nursing note reviewed.  ?Constitutional:   ?   General: He is not in acute distress. ?   Appearance: He is well-developed and normal weight. He is diaphoretic.  ?   Comments: Shouting, attempting to strike staff  ?HENT:  ?   Head: Normocephalic and atraumatic. No raccoon eyes, Battle's sign, right periorbital erythema or left periorbital erythema.  ?   Right Ear: External ear normal.  ?   Left Ear: External ear normal.  ?   Mouth/Throat:  ?   Mouth: Mucous membranes are moist.  ?Eyes:  ?   General: No scleral icterus. ?   Pupils: Pupils are equal, round, and  reactive to light.  ?   Comments: Pupils 3 mm equal reactive bilateral  ?Cardiovascular:  ?   Rate and Rhythm: Normal rate and regular rhythm.  ?   Pulses: Normal pulses.  ?   Heart sounds: Normal heart sounds.  ?Pulmonary:  ?   Effort: Pulmonary effort is normal. No tachypnea or respiratory distress.  ?   Breath sounds: Normal breath sounds. No stridor. No decreased breath sounds or wheezing.  ?Abdominal:  ?   General: Abdomen is flat.  ?   Palpations: Abdomen is soft.  ?   Tenderness: There is no abdominal tenderness.  ?Musculoskeletal:     ?   General: Normal range of motion.  ?   Cervical back: Full passive range of motion without pain and normal range of motion.  ?   Right lower leg: No edema.   ?   Left lower leg: No edema.  ?Skin: ?   General: Skin is warm.  ?   Capillary Refill: Capillary refill takes less than 2 seconds.  ?   Comments: Multiple healed linear scars on torso and upper extremities ?There is abrasion to right shoulder anterior  ?Neurological:  ?   Mental Status: He is disoriented.  ?   Comments: Patient combative, profound agitation, not following commands  ?Psychiatric:     ?   Mood and Affect: Mood normal.     ?   Behavior: Behavior is uncooperative, agitated, aggressive, hyperactive and combative.  ? ? ?ED Results / Procedures / Treatments   ?Labs ?(all labs ordered are listed, but only abnormal results are displayed) ?Labs Reviewed  ?CBC WITH DIFFERENTIAL/PLATELET - Abnormal; Notable for the following components:  ?    Result Value  ? Hemoglobin 11.8 (*)   ? HCT 35.5 (*)   ? MCV 79.8 (*)   ? RDW 15.9 (*)   ? All other components within normal limits  ?COMPREHENSIVE METABOLIC PANEL - Abnormal; Notable for the following components:  ? CO2 14 (*)   ? Creatinine, Ser 1.72 (*)   ? AST 44 (*)   ? ALT 60 (*)   ? GFR, Estimated 52 (*)   ? Anion gap 20 (*)   ? All other components within normal limits  ?SALICYLATE LEVEL - Abnormal; Notable for the following components:  ? Salicylate Lvl [id number] (*)   ? All other components within normal limits  ?ACETAMINOPHEN LEVEL - Abnormal; Notable for the following components:  ? Acetaminophen (Tylenol), Serum <10 (*)   ? All other components within normal limits  ?CK - Abnormal; Notable for the following components:  ? Total CK 506 (*)   ? All other components within normal limits  ?I-STAT VENOUS BLOOD GAS, ED - Abnormal; Notable for the following components:  ? pH, Ven 7.103 (*)   ? pO2, Ven 203 (*)   ? Bicarbonate 14.3 (*)   ? TCO2 16 (*)   ? Acid-base deficit 15.0 (*)   ? Calcium, Ion 1.12 (*)   ? HCT 38.0 (*)   ? Hemoglobin 12.9 (*)   ? All other components within normal limits  ?I-STAT VENOUS BLOOD GAS, ED - Abnormal; Notable for the following  components:  ? pCO2, Ven 42.9 (*)   ? pO2, Ven 80 (*)   ? HCT 34.0 (*)   ? Hemoglobin 11.6 (*)   ? All other components within normal limits  ?ETHANOL  ?OSMOLALITY  ?RAPID URINE DRUG SCREEN, HOSP PERFORMED  ? ? ?EKG ?EKG Interpretation ? ?Date/Time:  Tuesday Feb 05 2022 19:37:30 EDT ?Ventricular Rate:  105 ?PR Interval:  148 ?QRS Duration: 91 ?QT Interval:  368 ?QTC Calculation: 487 ?R Axis:   87 ?Text Interpretation: Sinus tachycardia Probable left atrial enlargement Minimal ST depression, inferior leads Borderline prolonged QT interval similar to prior Confirmed by Wynona Dove ([id number]) on 02/05/2022 11:58:55 PM ? ?Radiology ?CT Head Wo Contrast ? ?Result Date: 02/05/2022 ?CLINICAL DATA:  Delirium. EXAM: CT HEAD WITHOUT CONTRAST TECHNIQUE: Contiguous axial images were obtained from the base of the skull through the vertex without intravenous contrast. RADIATION DOSE REDUCTION: This exam was performed according to the departmental dose-optimization program which includes automated exposure control, adjustment of the mA and/or kV according to patient size and/or use of iterative reconstruction technique. COMPARISON:  Head CT dated 10/16/2019. FINDINGS: Brain: The ventricles and sulci are appropriate size for the patient's age. The Donnesha Karg-white matter discrimination is preserved. There is no acute intracranial hemorrhage. No mass effect or midline shift. No extra-axial fluid collection. Vascular: No hyperdense vessel or unexpected calcification. Skull: Normal. Negative for fracture or focal lesion. Sinuses/Orbits: Age indeterminate mildly depressed fracture of the left nasal bone. Correlation with clinical exam and point tenderness recommended. The visualized paranasal sinuses and mastoid air cells are otherwise clear. Other: None IMPRESSION: 1. No acute intracranial pathology. 2. Age indeterminate mildly depressed fracture of the left nasal bone. Correlation with clinical exam and point tenderness recommended.  Electronically Signed   By: Anner Crete M.D.   On: 02/05/2022 20:27  ? ?DG Chest Portable 1 View ? ?Result Date: 02/05/2022 ?CLINICAL DATA:  Altered mental status, recent trauma, initial encounter EXAM: PORTABLE CHEST 1 VI

## 2022-02-05 NOTE — ED Triage Notes (Signed)
Pt BIB [facility] for excited delirium.  PT found to be tearing up a bathroom at [employer] on [address].  EMS/[employer] restrained pt.  Pt received 400mg  of Ketamine IM and 500 bolus through 18G IV at 1850.  ? ? Pt was tachy in the 150-160 on scene, 96% RA  ETCO2 50-60 ?

## 2022-02-06 LAB — RAPID URINE DRUG SCREEN, HOSP PERFORMED
Amphetamines: POSITIVE — AB
Barbiturates: NOT DETECTED
Benzodiazepines: POSITIVE — AB
Cocaine: POSITIVE — AB
Opiates: NOT DETECTED
Tetrahydrocannabinol: NOT DETECTED

## 2022-02-06 MED ORDER — HALOPERIDOL LACTATE 5 MG/ML IJ SOLN
10.0000 mg | Freq: Once | INTRAMUSCULAR | Status: AC
  Administered 2022-02-06: 10 mg via INTRAVENOUS
  Filled 2022-02-06: qty 2

## 2022-02-06 MED ORDER — QUETIAPINE FUMARATE 100 MG PO TABS
100.0000 mg | ORAL_TABLET | Freq: Once | ORAL | Status: DC
Start: 2022-02-06 — End: 2022-02-06
  Filled 2022-02-06: qty 1

## 2022-02-06 MED ORDER — QUETIAPINE FUMARATE 100 MG PO TABS
100.0000 mg | ORAL_TABLET | Freq: Every day | ORAL | 0 refills | Status: DC
Start: 2022-02-06 — End: 2022-02-14

## 2022-02-06 MED ORDER — ONDANSETRON HCL 4 MG/2ML IJ SOLN
4.0000 mg | Freq: Once | INTRAMUSCULAR | Status: AC
  Administered 2022-02-06: 4 mg via INTRAVENOUS
  Filled 2022-02-06: qty 2

## 2022-02-06 MED ORDER — DIPHENHYDRAMINE HCL 50 MG/ML IJ SOLN
25.0000 mg | Freq: Once | INTRAMUSCULAR | Status: AC
  Administered 2022-02-06: 25 mg via INTRAVENOUS
  Filled 2022-02-06: qty 1

## 2022-02-06 NOTE — ED Notes (Signed)
Pt is currently resting quietly in bed at this time. NAD noted, resp equal and non-labored, side rails up x 2 at this time ?

## 2022-02-06 NOTE — ED Provider Notes (Signed)
Received patient in signout from Dr Oletta Cohn, briefly patient is a 36 year old male with a chief complaints of agitated  delirium.  This is the patient's fifth visit this week for the same.  Found to be acutely agitated received ketamine with EMS.  Awoke but was acutely agitated again in the emergency department and was resedated.  Patient had CT imaging that did not show acute intracranial pathology.  Plan to reassess.  ? ?Patient is awake and alert and conversant.  Was able to tolerate some by mouth here.  He is requesting a refill of his Seroquel.  We will have him follow-up with his family doctor. ?  Melene Plan, DO ?02/06/22 [id number] ? ?

## 2022-02-06 NOTE — ED Notes (Signed)
Pt awakened by RN at this time for discharge disposition. Pt states, "Go away!". Pt was told by RN he is for discharge. Provided with [place] sandwich and ginger ale at this time. Plan is to DC pt when he becomes more awake. Will continue to monitor.  ?

## 2022-02-06 NOTE — ED Notes (Signed)
Approx. 15 min ago, I was working with this pt and had been all evening.  He said he wanted a [place] sandwhich. As I was about to go get it, an Water engineer who stated he was not here for this pt was standing outside the room.  The pt recognized him. They were kinda bantering back and forth a little bit but everything seemed cool so I went to get the sandwhich. When I returned to the room the officer was standing in the doorway, in the pt's face calling him a "punk ass bitch" and using other profanity while waving his finger in the pt's face telling him to get back in the room.  The pt was in the room and the officer was standing in the doorway at this time.   As I stepped between them and asked the officer to step away from the room, the officer stated "i'll be seeing you again".  Someone else said they heard him say, "Ill be seeing you in the neighborhood". The pt said, "what does that mean"?  "Why did you say that?" The officer stated he works in the area where the pt lives or hangs out. The pt took that as a threat. I brought the pt a sandwhich but he was so worked up and upset and stating he was scared that he threw the sandwhich and was screaming tearfully, "why did he have to say that"? ? ?I came out of the room and said some "choice words" to the officer, asking him why he behaved like that with my pt and got him all upset. The officer was defending himself saying that the pt was "talking junk to him".  I stated that I didn't give a shit and that he had no business engaging with my pt much less talking to him the way he did.   ? ?The pt was very upset and stating he is scared to go home because he said he has engaged with this officer before and states that "they said they was gonna kill me". ? ?I have been working with this pt all evening.  We had a good relationship and he was not a threat. There was NO reason for the officer to EVER come over to the room much less engage with the pt.  It was  none of his business.  I have watched many officers engage with pt's over the years and I know for a fact that the behavior of this officer was completely unnecessary and unprofessional. ?

## 2022-02-06 NOTE — ED Notes (Signed)
Pt is now in H15 and this RN has assumed care of this pt ?

## 2022-02-06 NOTE — Discharge Instructions (Addendum)
It was a pleasure caring for you today in the emergency department. ° °Please return to the emergency department for any worsening or worrisome symptoms. ° ° °

## 2022-02-06 NOTE — ED Notes (Signed)
Pt asked multiple times to sit on bed due to being unsteady. Pt refusing  ?

## 2022-02-06 NOTE — ED Notes (Signed)
Pt is extremely agitated. Constantly getting up and down out of the bed, c/o bilat eye burning. Spoke with ED provider reference same. Pt is requesting for this RN to be his nurse at this time that he doesn't feel comfortable with anyone else. Spoke with charge nurse and assigned nurse at this time reference same.  ?

## 2022-02-10 ENCOUNTER — Emergency Department (HOSPITAL_COMMUNITY): Payer: PRIVATE HEALTH INSURANCE

## 2022-02-10 ENCOUNTER — Emergency Department (HOSPITAL_COMMUNITY)
Admission: EM | Admit: 2022-02-10 | Discharge: 2022-02-10 | Disposition: A | Discharge status: Home / Self Care | Payer: PRIVATE HEALTH INSURANCE | Source: Home / Self Care | Attending: Emergency Medicine | Admitting: Emergency Medicine

## 2022-02-10 ENCOUNTER — Emergency Department (HOSPITAL_BASED_OUTPATIENT_CLINIC_OR_DEPARTMENT_OTHER): Payer: PRIVATE HEALTH INSURANCE

## 2022-02-10 ENCOUNTER — Emergency Department (HOSPITAL_COMMUNITY)
Admission: EM | Admit: 2022-02-10 | Discharge: 2022-02-10 | Disposition: A | Discharge status: Home / Self Care | Payer: PRIVATE HEALTH INSURANCE | Attending: Emergency Medicine | Admitting: Emergency Medicine

## 2022-02-10 ENCOUNTER — Emergency Department (HOSPITAL_COMMUNITY): Admission: RE | Admit: 2022-02-10 | Payer: PRIVATE HEALTH INSURANCE | Source: Ambulatory Visit

## 2022-02-10 ENCOUNTER — Encounter (HOSPITAL_COMMUNITY): Payer: Self-pay | Admitting: Emergency Medicine

## 2022-02-10 ENCOUNTER — Other Ambulatory Visit: Payer: Self-pay

## 2022-02-10 DIAGNOSIS — R7309 Other abnormal glucose: Secondary | ICD-10-CM | POA: Insufficient documentation

## 2022-02-10 DIAGNOSIS — M79604 Pain in right leg: Secondary | ICD-10-CM

## 2022-02-10 DIAGNOSIS — R4781 Slurred speech: Secondary | ICD-10-CM | POA: Insufficient documentation

## 2022-02-10 DIAGNOSIS — F19920 Other psychoactive substance use, unspecified with intoxication, uncomplicated: Secondary | ICD-10-CM | POA: Insufficient documentation

## 2022-02-10 DIAGNOSIS — F1920 Other psychoactive substance dependence, uncomplicated: Secondary | ICD-10-CM | POA: Insufficient documentation

## 2022-02-10 DIAGNOSIS — M79605 Pain in left leg: Secondary | ICD-10-CM | POA: Insufficient documentation

## 2022-02-10 DIAGNOSIS — R609 Edema, unspecified: Secondary | ICD-10-CM

## 2022-02-10 DIAGNOSIS — R600 Localized edema: Secondary | ICD-10-CM | POA: Diagnosis not present

## 2022-02-10 DIAGNOSIS — M7989 Other specified soft tissue disorders: Secondary | ICD-10-CM | POA: Diagnosis present

## 2022-02-10 LAB — COMPREHENSIVE METABOLIC PANEL
ALT: 62 U/L — ABNORMAL HIGH (ref 0–44)
AST: 71 U/L — ABNORMAL HIGH (ref 15–41)
Albumin: 3.7 g/dL (ref 3.5–5.0)
Alkaline Phosphatase: 70 U/L (ref 38–126)
Anion gap: 7 (ref 5–15)
BUN: 17 mg/dL (ref 6–20)
CO2: 26 mmol/L (ref 22–32)
Calcium: 9.2 mg/dL (ref 8.9–10.3)
Chloride: 103 mmol/L (ref 98–111)
Creatinine, Ser: 0.87 mg/dL (ref 0.61–1.24)
GFR, Estimated: >60 mL/min (ref >60)
Glucose, Bld: 87 mg/dL (ref 70–99)
Potassium: 4.1 mmol/L (ref 3.5–5.1)
Sodium: 136 mmol/L (ref 135–145)
Total Bilirubin: 0.5 mg/dL (ref 0.3–1.2)
Total Protein: 7.3 g/dL (ref 6.5–8.1)

## 2022-02-10 LAB — CBC
HCT: 36.2 % — ABNORMAL LOW (ref 39.0–52.0)
Hemoglobin: 12.1 g/dL — ABNORMAL LOW (ref 13.0–17.0)
MCH: 26.2 pg (ref 26.0–34.0)
MCHC: 33.4 g/dL (ref 30.0–36.0)
MCV: 78.5 fL — ABNORMAL LOW (ref 80.0–100.0)
Platelets: 275 10*3/uL (ref 150–400)
RBC: 4.61 MIL/uL (ref 4.22–5.81)
RDW: 16.1 % — ABNORMAL HIGH (ref 11.5–15.5)
WBC: 11 10*3/uL — ABNORMAL HIGH (ref 4.0–10.5)
nRBC: 0 % (ref 0.0–0.2)

## 2022-02-10 LAB — BRAIN NATRIURETIC PEPTIDE: B Natriuretic Peptide: 40.7 pg/mL (ref 0.0–100.0)

## 2022-02-10 LAB — CBG MONITORING, ED: Glucose-Capillary: 113 mg/dL — ABNORMAL HIGH (ref 70–99)

## 2022-02-10 MED ORDER — NALOXONE HCL 0.4 MG/ML IJ SOLN
INTRAMUSCULAR | Status: AC
  Administered 2022-02-10: 0.4 mg via INTRAVENOUS
  Filled 2022-02-10: qty 1

## 2022-02-10 MED ORDER — NALOXONE HCL 0.4 MG/ML IJ SOLN: 0.4000 mg | Freq: Once | INTRAMUSCULAR | Status: AC

## 2022-02-10 MED ORDER — FUROSEMIDE 40 MG PO TABS: 40.0000 mg | ORAL_TABLET | Freq: Every day | ORAL | 0 refills | Status: DC

## 2022-02-10 NOTE — Progress Notes (Signed)
VASCULAR LAB    Bilateral lower extremity venous duplex has been performed.  See CV proc for preliminary results.  Gave verbal results to Dr. Roosvelt Harps, [facility], RVT 02/10/2022, 7:17 PM

## 2022-02-10 NOTE — ED Notes (Signed)
Pt placed on cardiac monitor. IV placed, and narcan administered.

## 2022-02-10 NOTE — Discharge Instructions (Signed)
You need to get some compression stockings at the pharmacy or grocery store.    Take the fluid pill as prescribed.  Return for ultrasound of your leg as directed.

## 2022-02-10 NOTE — ED Notes (Signed)
RN returned from bring pt upstairs and was informed pt was in bathroom for approx. 20 mins with bookbag. Was found by Vascular tech to be holding onto the sink and swaying. Pt had decreased responsiveness, and was carried from wheel chair to stretcher. Trifan MD at bedside talking to pt. Pt unable to complete vascular scan at this time. Pt than began to lay down and had decreased responsiveness to stimuli, with snoring respirations. Pupils were 4. Pt moved into room.

## 2022-02-10 NOTE — ED Triage Notes (Signed)
Pt to triage via [place] from motel.  Pt was seen at [facility] yesterday for bilateral lower leg pain and swelling to bilateral calves.  Pt was scheduled to come for outpatient DVT study today and thought he was supposed to come Monday.  Bystanders called EMS because pt was nodding off at motel and has history of drug use.  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  EMS gave Narcan kit for future use.

## 2022-02-10 NOTE — ED Provider Notes (Signed)
[name] EMERGENCY DEPARTMENT Provider Note   CSN: [id number] Arrival date & time: 02/10/22  1642     History  Chief Complaint  Patient presents with   Leg Pain   Addiction Problem    Tristan [name] is a 36 y.o. male presenting to the emergency department by EMS for DVT ultrasound.  The patient was seen in [facility] emergency department yesterday with complaint of bilateral lower leg pain, there was no vascular ultrasound available so he was advised to return in the morning for DVT study.  He lives at a motel.  Bystanders called EMS because the patient was found "nodding off" and sluggish at the hotel.  Patient had reported heroin use today to EMS.  Medical record review does show that the patient has a history of bipolar disorder and polysubstance abuse and PTSD.  He has required ketamine sedation in the past.  On my exam, was initially called to the patient's bedside as he reportedly had "locked himself in the bathroom" and after the bathroom was open the patient was found nodding off and sluggish with slurred speech.  He denied to me repetitively that he had used any drugs, though he reported using heroin earlier to the paramedics.  HPI     Home Medications Prior to Admission medications   Medication Sig Start Date End Date Taking? Authorizing Provider  escitalopram (LEXAPRO) 10 MG tablet Take 1 tablet (10 mg total) by mouth daily. 01/23/22 02/22/22  Karsten Ro, MD  furosemide (LASIX) 40 MG tablet Take 1 tablet (40 mg total) by mouth daily. 02/10/22   Roxy Horseman, PA-C  gabapentin (NEURONTIN) 100 MG capsule Take 1 capsule (100 mg total) by mouth 3 (three) times daily. 01/22/22 02/21/22  Karsten Ro, MD  hydrOXYzine (ATARAX) 25 MG tablet Take 1 tablet (25 mg total) by mouth 3 (three) times daily as needed for anxiety. 01/22/22   Karsten Ro, MD  loratadine (CLARITIN) 10 MG tablet Take 10 mg by mouth daily as needed for allergies.    [provider]  Multiple Vitamin (MULTIVITAMIN WITH MINERALS) TABS tablet Take 1 tablet by mouth daily. 01/23/22   Karsten Ro, MD  nicotine polacrilex (NICORETTE) 2 MG gum Take 1 each (2 mg total) by mouth as needed for smoking cessation. Patient not taking: Reported on 02/01/2022 01/22/22   Karsten Ro, MD  pantoprazole (PROTONIX) 40 MG tablet Take 1 tablet (40 mg total) by mouth daily. 01/23/22   Karsten Ro, MD  QUEtiapine (SEROQUEL) 100 MG tablet Take 1 tablet (100 mg total) by mouth at bedtime. 02/06/22   Melene Plan, DO  thiamine 100 MG tablet Take 1 tablet (100 mg total) by mouth daily. 01/23/22   Karsten Ro, MD      Allergies    Tomato    Review of Systems   Review of Systems  Physical Exam Updated Vital Signs BP (!) 104/40   Pulse 95   Temp 99 F (37.2 C) (Oral)   Resp 14   SpO2 97%  Physical Exam Constitutional:      Comments: Sluggish, nodding off,  HENT:     Head: Normocephalic and atraumatic.  Eyes:     Comments: Injected sclera bilaterally, pinpoint pupils  Cardiovascular:     Rate and Rhythm: Normal rate and regular rhythm.  Pulmonary:     Effort: Pulmonary effort is normal. No respiratory distress.  Abdominal:     General: There is no distension.     Tenderness: There is  no abdominal tenderness.  Skin:    General: Skin is warm and dry.  Neurological:     General: No focal deficit present.     Mental Status: He is alert. Mental status is at baseline.    ED Results / Procedures / Treatments   Labs (all labs ordered are listed, but only abnormal results are displayed) Labs Reviewed  CBG MONITORING, ED - Abnormal; Notable for the following components:      Result Value   Glucose-Capillary 113 (*)    All other components within normal limits    EKG None  Radiology DG Chest 2 View  Result Date: 02/10/2022 CLINICAL DATA:  36 year old male with history of bilateral leg pain and swelling. EXAM: CHEST - 2 VIEW COMPARISON:  Chest x-ray 02/05/2022.  FINDINGS: Lung volumes are normal. No consolidative airspace disease. No pleural effusions. No pneumothorax. No pulmonary nodule or mass noted. Pulmonary vasculature and the cardiomediastinal silhouette are within normal limits. IMPRESSION: No radiographic evidence of acute cardiopulmonary disease. Electronically Signed   By: Trudie Reed M.D.   On: 02/10/2022 05:47   VAS [place] LOWER EXTREMITY VENOUS (DVT) (ONLY [facility] & [facility])  Result Date: 02/10/2022  Lower Venous DVT Study Patient Name:  Tristan [name]  Date of Exam:   02/10/2022 Medical Rec #: [id number]                  Accession #:    [id number] Date of Birth: March 26, 1986                  Patient Gender: M Patient Age:   44 years Exam Location:  [facility] Procedure:      VAS [place] LOWER EXTREMITY VENOUS (DVT) Referring Phys: Roxy Horseman --------------------------------------------------------------------------------  Indications: Pain, and Swelling.  Risk Factors: Polysubstance abuse, Hepatitis C. Comparison Study: No prior study on file Performing Technologist: Sherren Kerns RVS  Examination Guidelines: A complete evaluation includes B-mode imaging, spectral Doppler, color Doppler, and power Doppler as needed of all accessible portions of each vessel. Bilateral testing is considered an integral part of a complete examination. Limited examinations for reoccurring indications may be performed as noted. The reflux portion of the exam is performed with the patient in reverse Trendelenburg.  +--------+---------------+---------+-----------+----------------+-------------+ RIGHT   CompressibilityPhasicitySpontaneityProperties      Thrombus                                                                 Aging         +--------+---------------+---------+-----------+----------------+-------------+ CFV     Full                               pulsatile                                                                waveforms                      +--------+---------------+---------+-----------+----------------+-------------+ SFJ     Full                                                             +--------+---------------+---------+-----------+----------------+-------------+  FV Prox Full                                                             +--------+---------------+---------+-----------+----------------+-------------+ FV Mid  Full                                                             +--------+---------------+---------+-----------+----------------+-------------+ FV      Full                                                             Distal                                                                   +--------+---------------+---------+-----------+----------------+-------------+ PFV     Full                                                             +--------+---------------+---------+-----------+----------------+-------------+ POP     Full                               pulsatile                                                                waveforms                     +--------+---------------+---------+-----------+----------------+-------------+ PTV     Full                                                             +--------+---------------+---------+-----------+----------------+-------------+ PERO    Full                                                             +--------+---------------+---------+-----------+----------------+-------------+   +--------+---------------+---------+-----------+----------------+-------------+ LEFT    CompressibilityPhasicitySpontaneityProperties      Thrombus  Aging         +--------+---------------+---------+-----------+----------------+-------------+ CFV     Full                               pulsatile                                                                 waveforms                     +--------+---------------+---------+-----------+----------------+-------------+ SFJ     Full                                                             +--------+---------------+---------+-----------+----------------+-------------+ FV Prox Full                                                             +--------+---------------+---------+-----------+----------------+-------------+ FV Mid  Full                                                             +--------+---------------+---------+-----------+----------------+-------------+ FV      Full                                                             Distal                                                                   +--------+---------------+---------+-----------+----------------+-------------+ PFV     Full                                                             +--------+---------------+---------+-----------+----------------+-------------+ POP     Full                               pulsatile  waveforms                     +--------+---------------+---------+-----------+----------------+-------------+ PTV     Full                                                             +--------+---------------+---------+-----------+----------------+-------------+ PERO    Full                                                             +--------+---------------+---------+-----------+----------------+-------------+    Summary: BILATERAL: - No evidence of deep vein thrombosis seen in the lower extremities, bilaterally. -No evidence of popliteal cyst, bilaterally. RIGHT: - Ultrasound characteristics of enlarged lymph nodes are noted in the groin. pulsatile waveforms suggestive of fluid overload  LEFT: - Ultrasound characteristics of enlarged lymph nodes noted in the  groin. pulsatile waveforms suggestive of fluid overload.  *See table(s) above for measurements and observations.    Preliminary     Procedures Procedures    Medications Ordered in ED Medications  naloxone [facility](NARCAN) injection 0.4 mg (0.4 mg Intravenous Given 02/10/22 1831)    ED Course/ Medical Decision Making/ A&P Clinical Course as of 02/10/22 2347  Sun Feb 10, 2022  [id number] Patient was given Narcan and rapidly responded to it, now awake but still groggy, ambulated back to the bathroom to urinate, under constant supervision at the moment.  We will attempt a bedside vascular ultrasound. [MT]  1912 No acute DVT.  Pt awake, ambulating, stable for discharge [MT]  2133 Patient is awake and mentating well after Narcan.  No acute DVT.  He has been observed for period of nearly 5 hours in the ED and remained stable on room air.  I believe he is reasonably safe for discharge at this time.  He is advised to avoid using any further illicit drugs [MT]    Clinical Course User Index [MT] Tyronn Golda, Kermit BaloMatthew J, MD                           Medical Decision Making Risk Prescription drug management.   Patient is presenting with what appears to be gross intoxication, which I suspect is related to drug use and possible opioid use, given his pinpoint pupils and sluggish behavior and slurred speech.  There may be polysubstance use.  His medical records are personally reviewed and does show a history of polysubstance use in the past as well as behavioral health and mental health disorders.  At this time I do not believe that he is safe for a vascular ultrasound, and instead we have placed him in a room on oxygen monitor, given how sluggish he is, and I have advised that Narcan be given.  If there is no safe way to obtain a DVT ultrasound today due to the patient's behavior or gross intoxication, he will need this performed at a different time.        Final Clinical Impression(s) / ED Diagnoses Final  diagnoses:  Pain in both lower extremities  Drug intoxication without complication (HCC)    Rx /  DC Orders ED Discharge Orders     None         Karalynn Cottone, Kermit Balo, MD 02/10/22 [phone]

## 2022-02-10 NOTE — ED Notes (Signed)
Pt alert and oriented. Pt ripped out his IV and is wanting to leave. RN educated pt that he never received his Vascular scan. Pt asked what the scan was for. RN reminded pt that he was here to have a vascular scan of his legs to make sure he does not have blood clots. Pt agreeable to having scan done.

## 2022-02-10 NOTE — ED Notes (Signed)
Pt ambulatory, tech helped pt to bathroom.

## 2022-02-10 NOTE — ED Triage Notes (Signed)
Pt presents EMS with bilateral lower leg pain and swelling to back of bilateral calves.  V/SS.  10/10 pain

## 2022-02-10 NOTE — Discharge Instructions (Signed)
Your DVT ultrasound did not show signs of blood clots in your legs.  You are given Narcan today for concern for drug overdose.  We strongly recommend that you do not use opioids or other street drugs like heroin or fentanyl, which can be life-threatening, and can lead to death.

## 2022-02-10 NOTE — ED Provider Notes (Signed)
[facility]-EMERGENCY DEPT [facility] Emergency Department Provider Note MRN:  [id number]  Arrival date & time: 02/10/22     Chief Complaint   Leg Swelling   History of Present Illness   Tristan [name] is a 36 y.o. year-old male presents to the ED with chief complaint of bilateral leg swelling.  He states that the symptoms started over the past day or so.  He states that his legs feel tight and it causes him pain to walk on them.  He denies any chest pain or shortness of breath.  Denies any injury or trauma.  States that this is ever happened before.  Denies any treatment prior to arrival.  History provided by patient.   Review of Systems  Pertinent review of systems noted in HPI.    Physical Exam   Vitals:   02/10/22 0525 02/10/22 0530  BP:  125/66  Pulse: 93 95  Resp: 18 16  Temp:    SpO2: 100% 100%    CONSTITUTIONAL:  well-appearing, NAD NEURO:  Alert and oriented x 3, CN 3-12 grossly intact EYES:  eyes equal and reactive ENT/NECK:  Supple, no stridor  CARDIO:  normal rate, regular rhythm, appears well-perfused  PULM:  No respiratory distress, CTAB GI/GU:  non-distended,  MSK/SPINE:  No gross deformities, 1+ pitting edema in lower extremities SKIN:  no rash, atraumatic   *Additional and/or pertinent findings included in MDM below  Diagnostic and Interventional Summary    EKG Interpretation  Date/Time:  Sunday Feb 10 2022 04:30:31 EDT Ventricular Rate:  79 PR Interval:  153 QRS Duration: 98 QT Interval:  383 QTC Calculation: 439 R Axis:   76 Text Interpretation: Sinus rhythm Normal ECG Confirmed by Geoffery Lyons ([id number]) on 02/10/2022 4:33:14 AM       Labs Reviewed  COMPREHENSIVE METABOLIC PANEL - Abnormal; Notable for the following components:      Result Value   AST 71 (*)    ALT 62 (*)    All other components within normal limits  CBC - Abnormal; Notable for the following components:   WBC 11.0 (*)    Hemoglobin 12.1 (*)    HCT 36.2 (*)     MCV 78.5 (*)    RDW 16.1 (*)    All other components within normal limits  BRAIN NATRIURETIC PEPTIDE    DG Chest 2 View  Final Result    LE VENOUS    (Results Pending)    Medications - No data to display   Procedures  /  Critical Care Procedures  ED Course and Medical Decision Making  I have reviewed the triage vital signs, the nursing notes, and pertinent available records from the EMR.  Social Determinants Affecting Complexity of Care: Patient has no clinically significant social determinants affecting this chief complaint..   ED Course:   Patient here with bilateral lower extremity pain and swelling.  Top differential diagnoses include dependant edema, CHF, DVT. Medical Decision Making Patient here with bilateral leg swelling.  Denies any trauma or injury.  He has 1+ pitting edema.  I suspect that his pain is secondary to the swelling.  His compartments do feel soft, and I doubt compartment syndrome.  There is no redness or erythema, he is afebrile.  Doubt infection.  I have instructed the patient to use compression stockings and have prescribed a few doses of Lasix.  He will need to follow-up with primary care.  Outpatient Doppler DVT studies ordered.  Problems Addressed: Peripheral edema: undiagnosed new problem  with uncertain prognosis  Amount and/or Complexity of Data Reviewed Labs: ordered.    Details: No significant electrolyte derangement, BNP is normal. Radiology: ordered.    Details: No effusion or opacities seen ECG/medicine tests: ordered.    Details: Normal sinus rhythm  Risk Prescription drug management.     Consultants: No consultations were needed in caring for this patient.   Treatment and Plan: Emergency department workup does not suggest an emergent condition requiring admission or immediate intervention beyond  what has been performed at this time. The patient is safe for discharge and has  been instructed to return immediately for worsening  symptoms, change in  symptoms or any other concerns    Final Clinical Impressions(s) / ED Diagnoses     ICD-10-CM   1. Peripheral edema  R60.9       ED Discharge Orders          Ordered    LE VENOUS        02/10/22 0613    furosemide (LASIX) 40 MG tablet  Daily        02/10/22 2876              Discharge Instructions Discussed with and Provided to Patient:     Discharge Instructions      You need to get some compression stockings at the pharmacy or grocery store.    Take the fluid pill as prescribed.  Return for ultrasound of your leg as directed.       Roxy Horseman, PA-C 02/10/22 8115    Geoffery Lyons, MD 02/10/22 [phone]

## 2022-02-10 NOTE — ED Notes (Signed)
Discharge instructions reviewed, Rx education provided. Pt continued to fall asleep while trying to review discharge instructions. After waking him up several times, pt states "I hear you". Pt then attempted to get out of bed and limped in pain. Pt requested to talk to the provider but provider no longer here. Dr Judd Lien notified and states to continue to discharge pt. Pt offered wheelchair to exit but refused. Pt ambulatory upon discharge. No s/s of distress noted. Pt aware that he needs to go to [facility] at 1100 for vascular study.

## 2022-02-11 ENCOUNTER — Inpatient Hospital Stay (HOSPITAL_COMMUNITY)
Admission: AD | Admit: 2022-02-11 | Discharge: 2022-02-14 | DRG: 885 | Disposition: A | Discharge status: Home / Self Care | Payer: PRIVATE HEALTH INSURANCE | Source: Intra-hospital | Attending: Emergency Medicine | Admitting: Emergency Medicine

## 2022-02-11 ENCOUNTER — Emergency Department (HOSPITAL_COMMUNITY)
Admission: EM | Admit: 2022-02-11 | Discharge: 2022-02-11 | Disposition: A | Discharge status: Home / Self Care | Payer: PRIVATE HEALTH INSURANCE | Attending: Emergency Medicine | Admitting: Emergency Medicine

## 2022-02-11 ENCOUNTER — Other Ambulatory Visit: Payer: Self-pay

## 2022-02-11 ENCOUNTER — Encounter (HOSPITAL_COMMUNITY): Payer: Self-pay | Admitting: Emergency Medicine

## 2022-02-11 ENCOUNTER — Ambulatory Visit (INDEPENDENT_AMBULATORY_CARE_PROVIDER_SITE_OTHER)
Admission: EM | Admit: 2022-02-11 | Discharge: 2022-02-11 | Disposition: A | Discharge status: Other Acute Inpatient Hospital | Payer: PRIVATE HEALTH INSURANCE | Source: Home / Self Care

## 2022-02-11 DIAGNOSIS — R600 Localized edema: Secondary | ICD-10-CM | POA: Insufficient documentation

## 2022-02-11 DIAGNOSIS — Z9151 Personal history of suicidal behavior: Secondary | ICD-10-CM

## 2022-02-11 DIAGNOSIS — F319 Bipolar disorder, unspecified: Secondary | ICD-10-CM | POA: Insufficient documentation

## 2022-02-11 DIAGNOSIS — F315 Bipolar disorder, current episode depressed, severe, with psychotic features: Principal | ICD-10-CM | POA: Diagnosis present

## 2022-02-11 DIAGNOSIS — T40601A Poisoning by unspecified narcotics, accidental (unintentional), initial encounter: Secondary | ICD-10-CM | POA: Diagnosis present

## 2022-02-11 DIAGNOSIS — Z20822 Contact with and (suspected) exposure to covid-19: Secondary | ICD-10-CM | POA: Insufficient documentation

## 2022-02-11 DIAGNOSIS — R440 Auditory hallucinations: Secondary | ICD-10-CM | POA: Insufficient documentation

## 2022-02-11 DIAGNOSIS — B1920 Unspecified viral hepatitis C without hepatic coma: Secondary | ICD-10-CM | POA: Insufficient documentation

## 2022-02-11 DIAGNOSIS — M7989 Other specified soft tissue disorders: Secondary | ICD-10-CM | POA: Diagnosis present

## 2022-02-11 DIAGNOSIS — Z5902 Unsheltered homelessness: Secondary | ICD-10-CM | POA: Diagnosis not present

## 2022-02-11 DIAGNOSIS — F1914 Other psychoactive substance abuse with psychoactive substance-induced mood disorder: Secondary | ICD-10-CM | POA: Diagnosis present

## 2022-02-11 DIAGNOSIS — F1994 Other psychoactive substance use, unspecified with psychoactive substance-induced mood disorder: Secondary | ICD-10-CM | POA: Insufficient documentation

## 2022-02-11 DIAGNOSIS — F17210 Nicotine dependence, cigarettes, uncomplicated: Secondary | ICD-10-CM | POA: Diagnosis present

## 2022-02-11 DIAGNOSIS — Z79899 Other long term (current) drug therapy: Secondary | ICD-10-CM | POA: Insufficient documentation

## 2022-02-11 DIAGNOSIS — G4700 Insomnia, unspecified: Secondary | ICD-10-CM | POA: Diagnosis present

## 2022-02-11 DIAGNOSIS — F1910 Other psychoactive substance abuse, uncomplicated: Secondary | ICD-10-CM | POA: Diagnosis present

## 2022-02-11 DIAGNOSIS — F29 Unspecified psychosis not due to a substance or known physiological condition: Secondary | ICD-10-CM | POA: Diagnosis present

## 2022-02-11 DIAGNOSIS — I10 Essential (primary) hypertension: Secondary | ICD-10-CM | POA: Diagnosis not present

## 2022-02-11 DIAGNOSIS — R45851 Suicidal ideations: Secondary | ICD-10-CM

## 2022-02-11 DIAGNOSIS — F1190 Opioid use, unspecified, uncomplicated: Secondary | ICD-10-CM | POA: Insufficient documentation

## 2022-02-11 DIAGNOSIS — F4310 Post-traumatic stress disorder, unspecified: Secondary | ICD-10-CM | POA: Diagnosis present

## 2022-02-11 DIAGNOSIS — B1710 Acute hepatitis C without hepatic coma: Secondary | ICD-10-CM

## 2022-02-11 DIAGNOSIS — R609 Edema, unspecified: Secondary | ICD-10-CM

## 2022-02-11 DIAGNOSIS — R000 Tachycardia, unspecified: Secondary | ICD-10-CM | POA: Diagnosis not present

## 2022-02-11 LAB — POCT URINE DRUG SCREEN - MANUAL ENTRY (I-SCREEN)
POC Amphetamine UR: POSITIVE — AB
POC Buprenorphine (BUP): NOT DETECTED
POC Cocaine UR: POSITIVE — AB
POC Marijuana UR: POSITIVE — AB
POC Methadone UR: NOT DETECTED
POC Methamphetamine UR: POSITIVE — AB
POC Morphine: POSITIVE — AB
POC Oxazepam (BZO): NOT DETECTED
POC Oxycodone UR: NOT DETECTED
POC Secobarbital (BAR): NOT DETECTED

## 2022-02-11 LAB — RESP PANEL BY RT-PCR (FLU A&B, COVID) ARPGX2
Influenza A by PCR: NEGATIVE
Influenza B by PCR: NEGATIVE
SARS Coronavirus 2 by RT PCR: NEGATIVE

## 2022-02-11 LAB — POC SARS CORONAVIRUS 2 AG: SARSCOV2ONAVIRUS 2 AG: NEGATIVE

## 2022-02-11 MED ORDER — PANTOPRAZOLE SODIUM 40 MG PO TBEC
40.0000 mg | DELAYED_RELEASE_TABLET | Freq: Every day | ORAL | Status: DC
  Administered 2022-02-11: 40 mg via ORAL
  Filled 2022-02-11: qty 1

## 2022-02-11 MED ORDER — ACETAMINOPHEN 325 MG PO TABS: 650.0000 mg | ORAL_TABLET | Freq: Four times a day (QID) | ORAL | Status: DC | PRN

## 2022-02-11 MED ORDER — MAGNESIUM HYDROXIDE 400 MG/5ML PO SUSP: 30.0000 mL | Freq: Every day | ORAL | Status: DC | PRN

## 2022-02-11 MED ORDER — ADULT MULTIVITAMIN W/MINERALS CH
1.0000 | ORAL_TABLET | Freq: Every day | ORAL | Status: DC
  Administered 2022-02-12 – 2022-02-14 (×3): 1 via ORAL
  Filled 2022-02-11 (×4): qty 1

## 2022-02-11 MED ORDER — ALUM & MAG HYDROXIDE-SIMETH 200-200-20 MG/5ML PO SUSP: 30.0000 mL | ORAL | Status: DC | PRN

## 2022-02-11 MED ORDER — FUROSEMIDE 40 MG PO TABS
40.0000 mg | ORAL_TABLET | Freq: Every day | ORAL | Status: DC
  Administered 2022-02-12: 40 mg via ORAL
  Filled 2022-02-11 (×3): qty 1

## 2022-02-11 MED ORDER — LORATADINE 10 MG PO TABS: 10.0000 mg | ORAL_TABLET | Freq: Every day | ORAL | Status: DC | PRN

## 2022-02-11 MED ORDER — PANTOPRAZOLE SODIUM 40 MG PO TBEC
40.0000 mg | DELAYED_RELEASE_TABLET | Freq: Every day | ORAL | Status: DC
  Administered 2022-02-12 – 2022-02-14 (×3): 40 mg via ORAL
  Filled 2022-02-11 (×4): qty 1

## 2022-02-11 MED ORDER — THIAMINE HCL 100 MG PO TABS
100.0000 mg | ORAL_TABLET | Freq: Every day | ORAL | Status: DC
  Administered 2022-02-11: 100 mg via ORAL
  Filled 2022-02-11: qty 1

## 2022-02-11 MED ORDER — ESCITALOPRAM OXALATE 10 MG PO TABS
10.0000 mg | ORAL_TABLET | Freq: Every day | ORAL | Status: DC
  Administered 2022-02-11: 10 mg via ORAL
  Filled 2022-02-11: qty 1

## 2022-02-11 MED ORDER — GABAPENTIN 100 MG PO CAPS
100.0000 mg | ORAL_CAPSULE | Freq: Three times a day (TID) | ORAL | Status: DC
  Administered 2022-02-11 (×3): 100 mg via ORAL
  Filled 2022-02-11 (×3): qty 1

## 2022-02-11 MED ORDER — FUROSEMIDE 40 MG PO TABS
40.0000 mg | ORAL_TABLET | Freq: Every day | ORAL | Status: DC
  Administered 2022-02-11: 40 mg via ORAL
  Filled 2022-02-11: qty 1

## 2022-02-11 MED ORDER — TRAZODONE HCL 50 MG PO TABS
50.0000 mg | ORAL_TABLET | Freq: Every evening | ORAL | Status: DC | PRN
  Administered 2022-02-13: 50 mg via ORAL
  Filled 2022-02-11: qty 1

## 2022-02-11 MED ORDER — ESCITALOPRAM OXALATE 10 MG PO TABS
10.0000 mg | ORAL_TABLET | Freq: Every day | ORAL | Status: DC
  Administered 2022-02-12 – 2022-02-14 (×3): 10 mg via ORAL
  Filled 2022-02-11 (×4): qty 1

## 2022-02-11 MED ORDER — TRAZODONE HCL 50 MG PO TABS: 50.0000 mg | ORAL_TABLET | Freq: Every evening | ORAL | Status: DC | PRN

## 2022-02-11 MED ORDER — HYDROXYZINE HCL 25 MG PO TABS: 25.0000 mg | ORAL_TABLET | Freq: Three times a day (TID) | ORAL | Status: DC | PRN

## 2022-02-11 MED ORDER — QUETIAPINE FUMARATE 100 MG PO TABS
100.0000 mg | ORAL_TABLET | Freq: Every day | ORAL | Status: DC
  Administered 2022-02-12 – 2022-02-13 (×2): 100 mg via ORAL
  Filled 2022-02-11 (×4): qty 1

## 2022-02-11 MED ORDER — ADULT MULTIVITAMIN W/MINERALS CH
1.0000 | ORAL_TABLET | Freq: Every day | ORAL | Status: DC
  Administered 2022-02-11: 1 via ORAL
  Filled 2022-02-11: qty 1

## 2022-02-11 MED ORDER — QUETIAPINE FUMARATE 100 MG PO TABS
100.0000 mg | ORAL_TABLET | Freq: Every day | ORAL | Status: DC
Start: 2022-02-11 — End: 2022-02-11
  Administered 2022-02-11: 100 mg via ORAL
  Filled 2022-02-11: qty 1

## 2022-02-11 MED ORDER — GABAPENTIN 100 MG PO CAPS
100.0000 mg | ORAL_CAPSULE | Freq: Three times a day (TID) | ORAL | Status: DC
  Administered 2022-02-12 – 2022-02-14 (×4): 100 mg via ORAL
  Filled 2022-02-11 (×10): qty 1

## 2022-02-11 NOTE — ED Notes (Signed)
Patient to unit and oriented to unit - no sxs of distress noted at this time - will monitor for safety

## 2022-02-11 NOTE — ED Provider Notes (Signed)
FBC/OBS ASAP Discharge Summary  Date and Time: 02/11/2022 6:12 PM  Name: Tristan [name]  MRN:  [id number]   Discharge Diagnoses:  Final diagnoses:  Substance induced mood disorder (HCC)  Suicidal ideation    Subjective: Tristan [name] 36 y.o., male patient presented to [facility] as [name] walk in with increasing depression and SI.  He was admitted to the continuous assessment unit for overnight observation.   Tristan [name], 36 y.o., male patient seen face to face by this provider, consulted with Tristan [name]; and chart reviewed on 02/11/22.  Per chart review patient was admitted to [facility] H4/26/2023-01/22/2022.  He was discharged home escitalopram 10 mg daily, gabapentin 100 mg 3 times daily, hydroxyzine 25 mg 3 times daily as needed, and Seroquel 100 mg nightly.  He has [name] psychiatric history of bipolar 1 disorder, polysubstance abuse, substance-induced mood disorder, and heroin use disorder.  He was also admitted to the [facility] and discharged on 01/09/2022 to [facility] residential substance abuse treatment center.  Patient reports when he arrived he had just found out he was positive for hepatitis C.  [facility] would not accept him until after he had followed up with infectious disease.  Patient is adamant that he is followed up with infectious disease and that he takes medications for hep c but per chart review that cannot be confirmed.  On evaluation Tristan [name] is alert/oriented x4 and cooperative.  He is disheveled and makes good eye contact.  His speech is normal.  Reports after he was discharged from [facility] on 01/22/2022 he has maintained his sobriety until yesterday. He relapsed and injected 1 g heroin.  He is denying any withdrawal symptoms at this time and reports only using heroin once.  States his battle with his addiction has affected his mental wellbeing.  He has increased his depression with feelings of helplessness, hopelessness, and worthlessness/guilt.  He  endorses suicidal ideations with [name] plan to jump off of [name] bridge or suicide by cop.  He cannot contract for safety.  He denies HI/self-harm.  He endorses auditory hallucinations.  He hears [name] voice that is not his own that tells him to kill himself.  He is denying visual hallucinations.  Objectively he does not appear to be responding to internal/external stimuli.  He has no distractibility, preoccupation, and is able to converse coherently.   Stay Summary:   Patient has remained calm/cooperative while on the unit.  He has been compliant with medications.  He has exhibited no unsafe behaviors.  He continues to meet the criteria for inpatient psychiatric admission.  [facility] notified and patient has been accepted.  He will be transported via safe transport.  Total Time spent with patient: 30 minutes  Past Psychiatric History: See H&P Past Medical History:  Past Medical History:  Diagnosis Date   Bipolar 1 disorder (HCC)    Hepatitis C    Hypertension    PTSD (post-traumatic stress disorder)     Past Surgical History:  Procedure Laterality Date   NO PAST SURGERIES     Family History:  Family History  Problem Relation Age of Onset   Other Father    Psychiatric Illness Father    Family Psychiatric History: See H&P Social History:  Social History   Substance and Sexual Activity  Alcohol Use Yes   Comment: Pint per day     Social History   Substance and Sexual Activity  Drug Use Yes   Types: Marijuana, Heroin, Cocaine  Comment: denies drug use    Social History   Socioeconomic History   Marital status: Single    Spouse name: Not on file   Number of children: Not on file   Years of education: Not on file   Highest education level: Not on file  Occupational History   Not on file  Tobacco Use   Smoking status: Every Day    Packs/day: 0.50    Types: Cigarettes   Smokeless tobacco: Never  Vaping Use   Vaping Use: Never used  Substance and Sexual Activity   Alcohol  use: Yes    Comment: Pint per day   Drug use: Yes    Types: Marijuana, Heroin, Cocaine    Comment: denies drug use   Sexual activity: Not Currently  Other Topics Concern   Not on file  Social History Narrative   ** Merged History Encounter **       Social Determinants of Health   Financial Resource Strain: Not on file  Food Insecurity: Not on file  Transportation Needs: Not on file  Physical Activity: Not on file  Stress: Not on file  Social Connections: Not on file   SDOH:  SDOH Screenings   Alcohol Screen: Medium Risk   Last Alcohol Screening Score (AUDIT): 20  Depression (PHQ2-9): Medium Risk   PHQ-2 Score: 17  Financial Resource Strain: Not on file  Food Insecurity: Not on file  Housing: Not on file  Physical Activity: Not on file  Social Connections: Not on file  Stress: Not on file  Tobacco Use: High Risk   Smoking Tobacco Use: Every Day   Smokeless Tobacco Use: Never   Passive Exposure: Not on file  Transportation Needs: Not on file    Tobacco Cessation:  Prescription not provided because: Patient is been accepted to [facility] for inpatient psychiatric admission  Current Medications:  Current Facility-Administered Medications  Medication Dose Route Frequency Provider Last Rate Last Admin   acetaminophen (TYLENOL) tablet 650 mg  650 mg Oral Q6H PRN Ajibola, Ene A, NP       alum & mag hydroxide-simeth (MAALOX/MYLANTA) 200-200-20 MG/5ML suspension 30 mL  30 mL Oral Q4H PRN Ajibola, Ene A, NP       escitalopram (LEXAPRO) tablet 10 mg  10 mg Oral Daily Ajibola, Ene A, NP   10 mg at 02/11/22 2440   furosemide (LASIX) tablet 40 mg  40 mg Oral Daily Ajibola, Ene A, NP   40 mg at 02/11/22 1027   gabapentin (NEURONTIN) capsule 100 mg  100 mg Oral TID Ajibola, Ene A, NP   100 mg at 02/11/22 1558   hydrOXYzine (ATARAX) tablet 25 mg  25 mg Oral TID PRN Ajibola, Ene A, NP       hydrOXYzine (ATARAX) tablet 25 mg  25 mg Oral TID PRN Ajibola, Ene A, NP       magnesium  hydroxide (MILK OF MAGNESIA) suspension 30 mL  30 mL Oral Daily PRN Ajibola, Ene A, NP       multivitamin with minerals tablet 1 tablet  1 tablet Oral Daily Ajibola, Ene A, NP   1 tablet at 02/11/22 0907   pantoprazole (PROTONIX) EC tablet 40 mg  40 mg Oral Daily Ajibola, Ene A, NP   40 mg at 02/11/22 0906   QUEtiapine (SEROQUEL) tablet 100 mg  100 mg Oral QHS Ajibola, Ene A, NP       thiamine tablet 100 mg  100 mg Oral Daily Ajibola, Ene  A, NP   100 mg at 02/11/22 0908   traZODone (DESYREL) tablet 50 mg  50 mg Oral QHS PRN Ajibola, Ene A, NP       Current Outpatient Medications  Medication Sig Dispense Refill   escitalopram (LEXAPRO) 10 MG tablet Take 1 tablet (10 mg total) by mouth daily. 30 tablet 0   furosemide (LASIX) 40 MG tablet Take 1 tablet (40 mg total) by mouth daily. 5 tablet 0   gabapentin (NEURONTIN) 100 MG capsule Take 1 capsule (100 mg total) by mouth 3 (three) times daily. 90 capsule 0   hydrOXYzine (ATARAX) 25 MG tablet Take 1 tablet (25 mg total) by mouth 3 (three) times daily as needed for anxiety. 30 tablet 0   loratadine (CLARITIN) 10 MG tablet Take 10 mg by mouth daily as needed for allergies.     Multiple Vitamin (MULTIVITAMIN WITH MINERALS) TABS tablet Take 1 tablet by mouth daily. 30 tablet 0   nicotine polacrilex (NICORETTE) 2 MG gum Take 1 each (2 mg total) by mouth as needed for smoking cessation. (Patient not taking: Reported on 02/01/2022) 100 tablet 0   pantoprazole (PROTONIX) 40 MG tablet Take 1 tablet (40 mg total) by mouth daily. 30 tablet 0   QUEtiapine (SEROQUEL) 100 MG tablet Take 1 tablet (100 mg total) by mouth at bedtime. 30 tablet 0   thiamine 100 MG tablet Take 1 tablet (100 mg total) by mouth daily. 30 tablet 0    PTA Medications: (Not in [name] hospital admission)   Musculoskeletal  Strength & Muscle Tone: within normal limits Gait & Station: normal Patient leans: N/[name]  Psychiatric Specialty Exam  Presentation  General Appearance: Appropriate for  Environment; Casual  Eye Contact:Good  Speech:Clear and Coherent; Normal Rate  Speech Volume:Normal  Handedness:Right   Mood and Affect  Mood:Anxious; Depressed  Affect:Congruent; Depressed   Thought Process  Thought Processes:Coherent  Descriptions of Associations:Intact  Orientation:Full (Time, Place and Person)  Thought Content:Logical  Diagnosis of Schizophrenia or Schizoaffective disorder in past: No  Duration of Psychotic Symptoms: Greater than six months   Hallucinations:Hallucinations: Auditory Description of Auditory Hallucinations: voices telling me to kill my self  Ideas of Reference:None  Suicidal Thoughts:Suicidal Thoughts: Yes, Active SI Active Intent and/or Plan: With Intent; With Plan; With Means to Carry Out  Homicidal Thoughts:Homicidal Thoughts: No   Sensorium  Memory:Immediate Good; Recent Good; Remote Good  Judgment:Fair  Insight:Fair   Executive Functions  Concentration:Good  Attention Span:Good  Recall:Good  Fund of Knowledge:Good  Language:Good   Psychomotor Activity  Psychomotor Activity:Psychomotor Activity: Normal   Assets  Assets:Communication Skills; Desire for Improvement; Financial Resources/Insurance; Leisure Time; Physical Health; Resilience   Sleep  Sleep:Sleep: Fair Number of Hours of Sleep: 7   Nutritional Assessment (For OBS and FBC admissions only) Has the patient had [name] weight loss or gain of 10 pounds or more in the last 3 months?: No Has the patient had [name] decrease in food intake/or appetite?: No Does the patient have dental problems?: No Does the patient have eating habits or behaviors that may be indicators of an eating disorder including binging or inducing vomiting?: No Has the patient recently lost weight without trying?: 0 Has the patient been eating poorly because of [name] decreased appetite?: 0 Malnutrition Screening Tool Score: 0    Physical Exam  Physical Exam Vitals and nursing note  reviewed.  Constitutional:      Appearance: Normal appearance. He is well-developed.  HENT:     Head: Normocephalic and  atraumatic.  Eyes:     General:        Right eye: No discharge.        Left eye: No discharge.     Conjunctiva/sclera: Conjunctivae normal.  Cardiovascular:     Rate and Rhythm: Normal rate.  Pulmonary:     Effort: Pulmonary effort is normal. No respiratory distress.  Musculoskeletal:        General: No swelling. Normal range of motion.     Cervical back: Normal range of motion.  Skin:    Capillary Refill: Capillary refill takes less than 2 seconds.     Coloration: Skin is not jaundiced or pale.  Neurological:     Mental Status: He is alert and oriented to person, place, and time.  Psychiatric:        Attention and Perception: Attention normal. He perceives auditory hallucinations.        Mood and Affect: Mood is anxious and depressed.        Speech: Speech normal.        Behavior: Behavior normal. Behavior is cooperative.        Thought Content: Thought content includes suicidal ideation. Thought content includes suicidal plan.        Cognition and Memory: Cognition normal.        Judgment: Judgment is impulsive.   Review of Systems  Constitutional: Negative.   HENT: Negative.    Eyes: Negative.   Respiratory: Negative.    Cardiovascular: Negative.   Musculoskeletal: Negative.   Skin: Negative.   Neurological: Negative.   Psychiatric/Behavioral:  Positive for depression, hallucinations, substance abuse and suicidal ideas. The patient is nervous/anxious.   Blood pressure 122/87, pulse 86, temperature 98.3 F (36.8 C), temperature source Oral, resp. rate 18, SpO2 100 %. There is no height or weight on file to calculate BMI.  Demographic Factors:  Male, Low socioeconomic status, Living alone, and Unemployed  Loss Factors: Financial problems/change in socioeconomic status  Historical Factors: Impulsivity  Risk Reduction Factors:   NA  Continued  Clinical Symptoms:  Severe Anxiety and/or Agitation Bipolar Disorder:   Depressive phase Depression:   Comorbid alcohol abuse/dependence Hopelessness Impulsivity Alcohol/Substance Abuse/Dependencies  Cognitive Features That Contribute To Risk:  None    Suicide Risk:  Severe:  Frequent, intense, and enduring suicidal ideation, specific plan, no subjective intent, but some objective markers of intent (i.e., choice of lethal method), the method is accessible, some limited preparatory behavior, evidence of impaired self-control, severe dysphoria/symptomatology, multiple risk factors present, and few if any protective factors, particularly [name] lack of social support.  Plan Of Care/Follow-up recommendations:  Activity:  as tolerated Diet:  regular  Disposition:   Ardis Hughsarolyn H Barbarita Hutmacher, NP 02/11/2022, 6:12 PM

## 2022-02-11 NOTE — ED Notes (Signed)
Pt had night time sneak.

## 2022-02-11 NOTE — ED Notes (Signed)
Received patient this PM. Patient is sleeping in his bed. Patient respirations are even and unlabored. Will continue to monitor for safety.  

## 2022-02-11 NOTE — ED Provider Notes (Signed)
[facility] EMERGENCY DEPARTMENT Provider Note   CSN: [id number] Arrival date & time: 02/11/22  0154     History  Chief Complaint  Patient presents with   Leg Swelling    Tristan [name] is a 36 y.o. male.  HPI  Patient presents for continued leg swelling.  Has pain and pressure in his legs when he walks.  No other acute complaints  Home Medications Prior to Admission medications   Medication Sig Start Date End Date Taking? Authorizing Provider  escitalopram (LEXAPRO) 10 MG tablet Take 1 tablet (10 mg total) by mouth daily. 01/23/22 02/22/22  Armando Reichert, MD  furosemide (LASIX) 40 MG tablet Take 1 tablet (40 mg total) by mouth daily. 02/10/22   Montine Circle, PA-C  gabapentin (NEURONTIN) 100 MG capsule Take 1 capsule (100 mg total) by mouth 3 (three) times daily. 01/22/22 02/21/22  Armando Reichert, MD  hydrOXYzine (ATARAX) 25 MG tablet Take 1 tablet (25 mg total) by mouth 3 (three) times daily as needed for anxiety. 01/22/22   Armando Reichert, MD  loratadine (CLARITIN) 10 MG tablet Take 10 mg by mouth daily as needed for allergies.    [provider]  Multiple Vitamin (MULTIVITAMIN WITH MINERALS) TABS tablet Take 1 tablet by mouth daily. 01/23/22   Armando Reichert, MD  nicotine polacrilex (NICORETTE) 2 MG gum Take 1 each (2 mg total) by mouth as needed for smoking cessation. Patient not taking: Reported on 02/01/2022 01/22/22   Armando Reichert, MD  pantoprazole (PROTONIX) 40 MG tablet Take 1 tablet (40 mg total) by mouth daily. 01/23/22   Armando Reichert, MD  QUEtiapine (SEROQUEL) 100 MG tablet Take 1 tablet (100 mg total) by mouth at bedtime. 02/06/22   Deno Etienne, DO  thiamine 100 MG tablet Take 1 tablet (100 mg total) by mouth daily. 01/23/22   Armando Reichert, MD      Allergies    Tomato    Review of Systems   Review of Systems  Cardiovascular:  Positive for leg swelling.   Physical Exam Updated Vital Signs BP (!) 146/76   Pulse 84   Temp 99.3 F (37.4 C)    Resp 16   SpO2 99%  Physical Exam CONSTITUTIONAL: Disheveled, lying on his right side HEAD: Normocephalic/atraumatic EYES: EOMI/PERRL LUNGS: no apparent distress NEURO: Pt is awake/alert/appropriate, moves all extremitiesx4.  No facial droop.   EXTREMITIES: pulses normal/equal, full ROM Distal pulses equal and intact Patient with pitting edema of the right lower extremity.  There is mild tenderness, no erythema SKIN: warm, color normal PSYCH: no abnormalities of mood noted, alert and oriented to situation  ED Results / Procedures / Treatments   Labs (all labs ordered are listed, but only abnormal results are displayed) Labs Reviewed - No data to display  EKG None  Radiology DG Chest 2 View  Result Date: 02/10/2022 CLINICAL DATA:  36 year old male with history of bilateral leg pain and swelling. EXAM: CHEST - 2 VIEW COMPARISON:  Chest x-ray 02/05/2022. FINDINGS: Lung volumes are normal. No consolidative airspace disease. No pleural effusions. No pneumothorax. No pulmonary nodule or mass noted. Pulmonary vasculature and the cardiomediastinal silhouette are within normal limits. IMPRESSION: No radiographic evidence of acute cardiopulmonary disease. Electronically Signed   By: Vinnie Langton M.D.   On: 02/10/2022 05:47   VAS [place] LOWER EXTREMITY VENOUS (DVT) (ONLY [facility] & [facility])  Result Date: 02/10/2022  Lower Venous DVT Study Patient Name:  Tristan [name]  Date of Exam:   02/10/2022  Medical Rec #: [id number]                  Accession #:    [id number] Date of Birth: Aug 08, 1986                  Patient Gender: M Patient Age:   79 years Exam Location:  [facility] Procedure:      VAS [place] LOWER EXTREMITY VENOUS (DVT) Referring Phys: Herbie Baltimore BROWNING --------------------------------------------------------------------------------  Indications: Pain, and Swelling.  Risk Factors: Polysubstance abuse, Hepatitis C. Comparison Study: No prior study on file Performing Technologist: Sharion Dove RVS  Examination Guidelines: A complete evaluation includes B-mode imaging, spectral Doppler, color Doppler, and power Doppler as needed of all accessible portions of each vessel. Bilateral testing is considered an integral part of a complete examination. Limited examinations for reoccurring indications may be performed as noted. The reflux portion of the exam is performed with the patient in reverse Trendelenburg.  +--------+---------------+---------+-----------+----------------+-------------+ RIGHT   CompressibilityPhasicitySpontaneityProperties      Thrombus                                                                 Aging         +--------+---------------+---------+-----------+----------------+-------------+ CFV     Full                               pulsatile                                                                waveforms                     +--------+---------------+---------+-----------+----------------+-------------+ SFJ     Full                                                             +--------+---------------+---------+-----------+----------------+-------------+ FV Prox Full                                                             +--------+---------------+---------+-----------+----------------+-------------+ FV Mid  Full                                                             +--------+---------------+---------+-----------+----------------+-------------+ FV      Full  Distal                                                                   +--------+---------------+---------+-----------+----------------+-------------+ PFV     Full                                                             +--------+---------------+---------+-----------+----------------+-------------+ POP     Full                               pulsatile                                                                 waveforms                     +--------+---------------+---------+-----------+----------------+-------------+ PTV     Full                                                             +--------+---------------+---------+-----------+----------------+-------------+ PERO    Full                                                             +--------+---------------+---------+-----------+----------------+-------------+   +--------+---------------+---------+-----------+----------------+-------------+ LEFT    CompressibilityPhasicitySpontaneityProperties      Thrombus                                                                 Aging         +--------+---------------+---------+-----------+----------------+-------------+ CFV     Full                               pulsatile                                                                waveforms                     +--------+---------------+---------+-----------+----------------+-------------+  SFJ     Full                                                             +--------+---------------+---------+-----------+----------------+-------------+ FV Prox Full                                                             +--------+---------------+---------+-----------+----------------+-------------+ FV Mid  Full                                                             +--------+---------------+---------+-----------+----------------+-------------+ FV      Full                                                             Distal                                                                   +--------+---------------+---------+-----------+----------------+-------------+ PFV     Full                                                             +--------+---------------+---------+-----------+----------------+-------------+ POP     Full                                pulsatile                                                                waveforms                     +--------+---------------+---------+-----------+----------------+-------------+ PTV     Full                                                             +--------+---------------+---------+-----------+----------------+-------------+ PERO    Full                                                             +--------+---------------+---------+-----------+----------------+-------------+  Summary: BILATERAL: - No evidence of deep vein thrombosis seen in the lower extremities, bilaterally. -No evidence of popliteal cyst, bilaterally. RIGHT: - Ultrasound characteristics of enlarged lymph nodes are noted in the groin. pulsatile waveforms suggestive of fluid overload  LEFT: - Ultrasound characteristics of enlarged lymph nodes noted in the groin. pulsatile waveforms suggestive of fluid overload.  *See table(s) above for measurements and observations.    Preliminary     Procedures Procedures    Medications Ordered in ED Medications - No data to display  ED Course/ Medical Decision Making/ A&P                           Medical Decision Making  Patient presents for his 22nd ER visit in 6 months. He was just seen for leg swelling and had extensive work-up including negative DVT studies, negative chest x-ray and negative BNP. There is no signs of cellulitis. No signs of acute arterial occlusion. No signs of trauma Discharge home.  Referred to PCP.        Final Clinical Impression(s) / ED Diagnoses Final diagnoses:  Peripheral edema    Rx / DC Orders ED Discharge Orders     None         Ripley Fraise, MD 02/11/22 [phone]

## 2022-02-11 NOTE — ED Triage Notes (Signed)
Patient reports persistent pain at right lower leg with mild swelling onset last week , denies injury/ambulatory , denies SOB , seen here and [facility] multiple times for the same complaints .

## 2022-02-11 NOTE — Discharge Instructions (Addendum)
Transfer to [facility] 

## 2022-02-11 NOTE — ED Notes (Signed)
Patient alerts and oriented x4, ambulatory with steady gait, denies needs at this time.
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
## 2022-02-11 NOTE — ED Notes (Addendum)
Report called to [facility]. Request patient arrive after 2200.

## 2022-02-11 NOTE — ED Notes (Signed)
Voluntary form faxed and confirmation received. Original placed in an envelope and into folder at the nurses desk. Informed AC via secure chat.

## 2022-02-11 NOTE — ED Provider Notes (Signed)
[facility] Continuous Assessment Admission H&P  Date: 02/11/22 Patient Name: Tristan [name] MRN: [id number] Chief Complaint:  Chief Complaint  Patient presents with   Addiction Problem   Suicidal      Diagnoses:  Final diagnoses:  Substance induced mood disorder ([place])  Suicidal ideation    HPI: Tristan [name] is a 36 year old male with psychiatric history significant for IV illicit drug abuse, depression, bipolar 1 disorder, and PTSD.  Patient presented voluntarily to [facility] follow-up in assessment due to worsening depressive symptoms and suicidal ideation.  This nurse petitioner met with patient face-to-face and reviewed his chart.  On assessment, patient is alert and oriented x4.  He is calm and cooperative.  Patient has good eye contact, his speech is clear and coherent.  Patient's mood is depressed with congruent affect his thought process is coherent.  Patient did not appear to be responding to any internal/external stimuli or experiencing any delusional thought content during assessment.  Patient reports that he is depressed due to ongoing substance abuse. Patient says he has been struggling with substance abuse for nearly 7 years. Patient reports that he is actively seeking substance abuse treatment however " no one is willing to help me."  Patient states that he started having feelings of hopelessness, worthlessness, sadness, raising thoughts, and anxiety while being evaluated earlier today at [facility] for leg swelling. He says his depressive symptoms has been getting worse over the past few day but "came to wits end" earlier today. Patient states that he is unable to contract for safety at this time.  Patient is endorsing suicidal ideation with a plan to jump off a bridge.  Patient endorses paas history of several suicidal attempts.  Patient denies homicidal ideation; he says that he feels paranoid.  He says that he is paranoid about people hurting him.  Patient  is endorsing auditory hallucinations of "people saying negative things and voices saying kill myself."  Patient denies visual hallucination.  Patient reports using about 1 g of heroin via IV earlier today.    PHQ 2-9:  [place] ED from 02/11/2022 in [facility] ED from 01/07/2022 in [facility] ED from 11/21/2021 in [facility]  Thoughts that you would be better off dead, or of hurting yourself in some way Several days Several days More than half the days  PHQ-9 Total Score _0 Flowsheet Row ED from 02/11/2022 in [facility] Most recent reading at 02/11/2022  5:08 AM ED from 02/11/2022 in [place] Most recent reading at 02/11/2022  2:01 AM ED from 02/10/2022 in [place] Most recent reading at 02/10/2022  4:47 PM  C-SSRS RISK CATEGORY High Risk No Risk Error: Q3, 4, or 5 should not be populated when Q2 is No        Total Time spent with patient: 15 minutes  Musculoskeletal  Strength & Muscle Tone: within normal limits Gait & Station: normal Patient leans: Right  Psychiatric Specialty Exam  Presentation General Appearance: Appropriate for Environment  Eye Contact:Good  Speech:Clear and Coherent  Speech Volume:Normal  Handedness:Right   Mood and Affect  Mood:Depressed  Affect:Congruent   Thought Process  Thought Processes:Coherent  Descriptions of Associations:Intact  Orientation:Full (Time, Place and Person)  Thought Content:WDL  Diagnosis of Schizophrenia or Schizoaffective disorder in past: No  Duration of Psychotic Symptoms: Greater than six  months  Hallucinations:Hallucinations: Auditory Description of Auditory Hallucinations: "people saying negative stuff, kill myself"  Ideas of Reference:None  Suicidal Thoughts:Suicidal Thoughts: Yes, Active SI Active  Intent and/or Plan: With Plan  Homicidal Thoughts:Homicidal Thoughts: No   Sensorium  Memory:Immediate Good; Recent Good; Remote Good  Judgment:Fair  Insight:Good   Executive Functions  Concentration:Fair  Attention Span:Good  [place]  Language:Good   Psychomotor Activity  Psychomotor Activity:Psychomotor Activity: Normal   Assets  Assets:Desire for Improvement; Armed forces logistics/support/administrative officer; Social Support   Sleep  Sleep:Sleep: Fair Number of Hours of Sleep: 7   Nutritional Assessment (For OBS and FBC admissions only) Has the patient had a weight loss or gain of 10 pounds or more in the last 3 months?: No Has the patient had a decrease in food intake/or appetite?: No Does the patient have dental problems?: No Does the patient have eating habits or behaviors that may be indicators of an eating disorder including binging or inducing vomiting?: No Has the patient recently lost weight without trying?: 0 Has the patient been eating poorly because of a decreased appetite?: 0 Malnutrition Screening Tool Score: 0    Physical Exam Vitals and nursing note reviewed.  Constitutional:      General: He is not in acute distress.    Appearance: He is well-developed.  HENT:     Head: Normocephalic and atraumatic.  Eyes:     Conjunctiva/sclera: Conjunctivae normal.  Cardiovascular:     Rate and Rhythm: Normal rate.     Heart sounds: No murmur heard. Pulmonary:     Effort: Pulmonary effort is normal. No respiratory distress.     Breath sounds: Normal breath sounds.  Abdominal:     Palpations: Abdomen is soft.     Tenderness: There is no abdominal tenderness.  Musculoskeletal:        General: No swelling.     Cervical back: Normal range of motion.  Skin:    General: Skin is warm and dry.     Capillary Refill: Capillary refill takes less than 2 seconds.  Neurological:     Mental Status: He is alert and oriented to person, place, and time.   Psychiatric:        Attention and Perception: Attention and perception normal.        Mood and Affect: Mood normal.        Speech: Speech normal.        Behavior: Behavior normal. Behavior is cooperative.        Thought Content: Thought content is paranoid. Thought content includes suicidal ideation. Thought content includes suicidal plan.   Review of Systems  Constitutional: Negative.   HENT: Negative.    Eyes: Negative.   Respiratory: Negative.    Cardiovascular: Negative.   Gastrointestinal: Negative.   Genitourinary: Negative.   Musculoskeletal: Negative.   Skin: Negative.   Neurological: Negative.   Endo/Heme/Allergies: Negative.   Psychiatric/Behavioral:  Positive for depression, hallucinations, substance abuse and suicidal ideas. The patient is nervous/anxious.    Blood pressure 121/80, pulse 81, temperature 98.7 F (37.1 C), temperature source Oral, resp. rate 18, SpO2 98 %. There is no height or weight on file to calculate BMI.  Past Psychiatric History:  polysubstance abuse (opiates, heroin, crack/cocaine, amphetamine), depression, bipolar 1 disorder, and PTSD.  Is the patient at risk to self? Yes  Has the patient been a risk to self in the past 6 months? Yes .    Has the patient been a risk to self  within the distant past? Yes   Is the patient a risk to others? No   Has the patient been a risk to others in the past 6 months? No   Has the patient been a risk to others within the distant past? No   Past Medical History:  Past Medical History:  Diagnosis Date   Bipolar 1 disorder ([place])    Hepatitis C    Hypertension    PTSD (post-traumatic stress disorder)     Past Surgical History:  Procedure Laterality Date   NO PAST SURGERIES      Family History:  Family History  Problem Relation Age of Onset   Other Father    Psychiatric Illness Father     Social History:  Social History   Socioeconomic History   Marital status: Single    Spouse name: Not on  file   Number of children: Not on file   Years of education: Not on file   Highest education level: Not on file  Occupational History   Not on file  Tobacco Use   Smoking status: Every Day    Packs/day: 0.50    Types: Cigarettes   Smokeless tobacco: Never  Vaping Use   Vaping Use: Never used  Substance and Sexual Activity   Alcohol use: Yes    Comment: Pint per day   Drug use: Yes    Types: Marijuana, Heroin, Cocaine    Comment: denies drug use   Sexual activity: Not Currently  Other Topics Concern   Not on file  Social History Narrative   ** Merged History Encounter **       Social Determinants of Health   Financial Resource Strain: Not on file  Food Insecurity: Not on file  Transportation Needs: Not on file  Physical Activity: Not on file  Stress: Not on file  Social Connections: Not on file  Intimate Partner Violence: Not on file    SDOH:  SDOH Screenings   Alcohol Screen: Medium Risk   Last Alcohol Screening Score (AUDIT): 20  Depression (PHQ2-9): Medium Risk   PHQ-2 Score: 17  Financial Resource Strain: Not on file  Food Insecurity: Not on file  Housing: Not on file  Physical Activity: Not on file  Social Connections: Not on file  Stress: Not on file  Tobacco Use: High Risk   Smoking Tobacco Use: Every Day   Smokeless Tobacco Use: Never   Passive Exposure: Not on file  Transportation Needs: Not on file    Last Labs:  Admission on 02/10/2022, Discharged on 02/10/2022  Component Date Value Ref Range Status   Glucose-Capillary 02/10/2022 113 (H)  70 - 99 mg/dL Final   Glucose reference range applies only to samples taken after fasting for at least 8 hours.  Admission on 02/10/2022, Discharged on 02/10/2022  Component Date Value Ref Range Status   B Natriuretic Peptide 02/10/2022 40.7  0.0 - 100.0 pg/mL Final   Performed at [facility], [place] [address], [place], [place] [address]   Sodium 02/10/2022 136  135 - 145 mmol/L Final    Potassium 02/10/2022 4.1  3.5 - 5.1 mmol/L Final   Chloride 02/10/2022 103  98 - 111 mmol/L Final   CO2 02/10/2022 26  22 - 32 mmol/L Final   Glucose, Bld 02/10/2022 87  70 - 99 mg/dL Final   Glucose reference range applies only to samples taken after fasting for at least 8 hours.   BUN 02/10/2022 17  6 - 20  mg/dL Final   Creatinine, Ser 02/10/2022 0.87  0.61 - 1.24 mg/dL Final   Calcium 02/10/2022 9.2  8.9 - 10.3 mg/dL Final   Total Protein 02/10/2022 7.3  6.5 - 8.1 g/dL Final   Albumin 02/10/2022 3.7  3.5 - 5.0 g/dL Final   AST 02/10/2022 71 (H)  15 - 41 U/L Final   ALT 02/10/2022 62 (H)  0 - 44 U/L Final   Alkaline Phosphatase 02/10/2022 70  38 - 126 U/L Final   Total Bilirubin 02/10/2022 0.5  0.3 - 1.2 mg/dL Final   GFR, Estimated 02/10/2022 >60  >60 mL/min Final   Comment: (NOTE) Calculated using the CKD-EPI Creatinine Equation (2021)    Anion gap 02/10/2022 7  5 - 15 Final   Performed at [facility], [place] [address] [place], [place] [address]   WBC 02/10/2022 11.0 (H)  4.0 - 10.5 K/uL Final   RBC 02/10/2022 4.61  4.22 - 5.81 MIL/uL Final   Hemoglobin 02/10/2022 12.1 (L)  13.0 - 17.0 g/dL Final   HCT 02/10/2022 36.2 (L)  39.0 - 52.0 % Final   MCV 02/10/2022 78.5 (L)  80.0 - 100.0 fL Final   MCH 02/10/2022 26.2  26.0 - 34.0 pg Final   MCHC 02/10/2022 33.4  30.0 - 36.0 g/dL Final   RDW 02/10/2022 16.1 (H)  11.5 - 15.5 % Final   Platelets 02/10/2022 275  150 - 400 K/uL Final   nRBC 02/10/2022 0.0  0.0 - 0.2 % Final   Performed at [facility], [place] [address], [place], [place] [address]  Admission on 02/05/2022, Discharged on 02/06/2022  Component Date Value Ref Range Status   WBC 02/05/2022 9.0  4.0 - 10.5 K/uL Final   RBC 02/05/2022 4.45  4.22 - 5.81 MIL/uL Final   Hemoglobin 02/05/2022 11.8 (L)  13.0 - 17.0 g/dL Final   HCT 02/05/2022 35.5 (L)  39.0 - 52.0 % Final   MCV 02/05/2022 79.8 (L)  80.0 - 100.0 fL Final   MCH 02/05/2022 26.5   26.0 - 34.0 pg Final   MCHC 02/05/2022 33.2  30.0 - 36.0 g/dL Final   RDW 02/05/2022 15.9 (H)  11.5 - 15.5 % Final   Platelets 02/05/2022 340  150 - 400 K/uL Final   nRBC 02/05/2022 0.0  0.0 - 0.2 % Final   Neutrophils Relative % 02/05/2022 55  % Final   Neutro Abs 02/05/2022 5.0  1.7 - 7.7 K/uL Final   Lymphocytes Relative 02/05/2022 33  % Final   Lymphs Abs 02/05/2022 3.0  0.7 - 4.0 K/uL Final   Monocytes Relative 02/05/2022 8  % Final   Monocytes Absolute 02/05/2022 0.7  0.1 - 1.0 K/uL Final   Eosinophils Relative 02/05/2022 3  % Final   Eosinophils Absolute 02/05/2022 0.2  0.0 - 0.5 K/uL Final   Basophils Relative 02/05/2022 1  % Final   Basophils Absolute 02/05/2022 0.1  0.0 - 0.1 K/uL Final   Immature Granulocytes 02/05/2022 0  % Final   Abs Immature Granulocytes 02/05/2022 0.04  0.00 - 0.07 K/uL Final   Performed at [facility], [place] [address] [place], [place] [address]   Sodium 02/05/2022 142  135 - 145 mmol/L Final   Potassium 02/05/2022 3.8  3.5 - 5.1 mmol/L Final   Chloride 02/05/2022 108  98 - 111 mmol/L Final   CO2 02/05/2022 14 (L)  22 - 32 mmol/L Final   Glucose, Bld 02/05/2022 70  70 -  99 mg/dL Final   Glucose reference range applies only to samples taken after fasting for at least 8 hours.   BUN 02/05/2022 12  6 - 20 mg/dL Final   Creatinine, Ser 02/05/2022 1.72 (H)  0.61 - 1.24 mg/dL Final   Calcium 02/05/2022 8.9  8.9 - 10.3 mg/dL Final   Total Protein 02/05/2022 6.7  6.5 - 8.1 g/dL Final   Albumin 02/05/2022 3.7  3.5 - 5.0 g/dL Final   AST 02/05/2022 44 (H)  15 - 41 U/L Final   ALT 02/05/2022 60 (H)  0 - 44 U/L Final   Alkaline Phosphatase 02/05/2022 79  38 - 126 U/L Final   Total Bilirubin 02/05/2022 1.0  0.3 - 1.2 mg/dL Final   GFR, Estimated 02/05/2022 52 (L)  >60 mL/min Final   Comment: (NOTE) Calculated using the CKD-EPI Creatinine Equation (2021)    Anion gap 02/05/2022 20 (H)  5 - 15 Final   Performed at [facility], [place] [address] [place], [place] [address]   Alcohol, Ethyl (B) 02/05/2022 <10  <10 mg/dL Final   Comment: (NOTE) Lowest detectable limit for serum alcohol is 10 mg/dL.  For medical purposes only. Performed at [facility], [place] [address] [place], [place] [address]    Salicylate Lvl 07/37/1062 <7.0 (L)  7.0 - 30.0 mg/dL Final   Performed at [place] [address], [place], [place] [address]   Acetaminophen (Tylenol), Serum 02/05/2022 <10 (L)  10 - 30 ug/mL Final   Comment: (NOTE) Therapeutic concentrations vary significantly. A range of 10-30 ug/mL  may be an effective concentration for many patients. However, some  are best treated at concentrations outside of this range. Acetaminophen concentrations >150 ug/mL at 4 hours after ingestion  and >50 ug/mL at 12 hours after ingestion are often associated with  toxic reactions.  Performed at [facility], [place] [address], [place], [place] [address]    Opiates 02/06/2022 NONE DETECTED  NONE DETECTED Final   Cocaine 02/06/2022 POSITIVE (A)  NONE DETECTED Final   Benzodiazepines 02/06/2022 POSITIVE (A)  NONE DETECTED Final   Amphetamines 02/06/2022 POSITIVE (A)  NONE DETECTED Final   Tetrahydrocannabinol 02/06/2022 NONE DETECTED  NONE DETECTED Final   Barbiturates 02/06/2022 NONE DETECTED  NONE DETECTED Final   Comment: (NOTE) DRUG SCREEN FOR MEDICAL PURPOSES ONLY.  IF CONFIRMATION IS NEEDED FOR ANY PURPOSE, NOTIFY LAB WITHIN 5 DAYS.  LOWEST DETECTABLE LIMITS FOR URINE DRUG SCREEN Drug Class                     Cutoff (ng/mL) Amphetamine and metabolites    1000 Barbiturate and metabolites    200 Benzodiazepine                 350 Tricyclics and metabolites     300 Opiates and metabolites        300 Cocaine and metabolites        300 THC                            50 Performed at [facility], [place] [address] [place], [place] [address]    Osmolality 02/05/2022 295  275 - 295 mOsm/kg Final   Performed at [place] [address], [place], [place] [address]   Total CK 02/05/2022 506 (H)  49 - 397 U/L Final   Performed at  [facility], [place] [address] [place], [place] [address]   pH, Ven 02/05/2022 7.103 (LL)  7.25 - 7.43 Final   pCO2, Ven 02/05/2022 45.8  44 - 60 mmHg Final   pO2, Ven 02/05/2022 203 (H)  32 - 45 mmHg Final   Bicarbonate 02/05/2022 14.3 (L)  20.0 - 28.0 mmol/L Final   TCO2 02/05/2022 16 (L)  22 - 32 mmol/L Final   O2 Saturation 02/05/2022 99  % Final   Acid-base deficit 02/05/2022 15.0 (H)  0.0 - 2.0 mmol/L Final   Sodium 02/05/2022 141  135 - 145 mmol/L Final   Potassium 02/05/2022 3.8  3.5 - 5.1 mmol/L Final   Calcium, Ion 02/05/2022 1.12 (L)  1.15 - 1.40 mmol/L Final   HCT 02/05/2022 38.0 (L)  39.0 - 52.0 % Final   Hemoglobin 02/05/2022 12.9 (L)  13.0 - 17.0 g/dL Final   Sample type 02/05/2022 VENOUS   Final   Comment 02/05/2022 NOTIFIED PHYSICIAN   Final   pH, Ven 02/05/2022 7.360  7.25 - 7.43 Final   pCO2, Ven 02/05/2022 42.9 (L)  44 - 60 mmHg Final   pO2, Ven 02/05/2022 80 (H)  32 - 45 mmHg Final   Bicarbonate 02/05/2022 24.3  20.0 - 28.0 mmol/L Final   TCO2 02/05/2022 26  22 - 32 mmol/L Final   O2 Saturation 02/05/2022 95  % Final   Acid-base deficit 02/05/2022 1.0  0.0 - 2.0 mmol/L Final   Sodium 02/05/2022 141  135 - 145 mmol/L Final   Potassium 02/05/2022 3.9  3.5 - 5.1 mmol/L Final   Calcium, Ion 02/05/2022 1.18  1.15 - 1.40 mmol/L Final   HCT 02/05/2022 34.0 (L)  39.0 - 52.0 % Final   Hemoglobin 02/05/2022 11.6 (L)  13.0 - 17.0 g/dL Final   Sample type 02/05/2022 VENOUS   Final  Admission on 01/31/2022, Discharged on 02/01/2022  Component Date Value Ref Range Status   SARS Coronavirus 2 by RT PCR 01/31/2022 NEGATIVE  NEGATIVE Final   Comment: (NOTE) SARS-CoV-2 target nucleic acids are NOT DETECTED.  The SARS-CoV-2 RNA is generally detectable in upper respiratory specimens during the acute phase of infection. The lowest concentration of  SARS-CoV-2 viral copies this assay can detect is 138 copies/mL. A negative result does not preclude SARS-Cov-2 infection and should not be used as the sole basis for treatment or other patient management decisions. A negative result may occur with  improper specimen collection/handling, submission of specimen other than nasopharyngeal swab, presence of viral mutation(s) within the areas targeted by this assay, and inadequate number of viral copies(<138 copies/mL). A negative result must be combined with clinical observations, patient history, and epidemiological information. The expected result is Negative.  Fact Sheet for Patients:  EntrepreneurPulse.com.au  Fact Sheet for Healthcare Providers:  IncredibleEmployment.be  This test is no                          t yet approved or cleared by the [place] FDA and  has been authorized for detection and/or diagnosis of SARS-CoV-2 by FDA under an Emergency Use Authorization (EUA). This EUA will remain  in effect (meaning this test can be used) for the duration of the COVID-19 declaration under Section 564(b)(1) of the Act, 21 U.S.C.section 360bbb-3(b)(1), unless the authorization is terminated  or revoked sooner.       Influenza A by PCR 01/31/2022 NEGATIVE  NEGATIVE Final   Influenza B by PCR 01/31/2022  NEGATIVE  NEGATIVE Final   Comment: (NOTE) The Xpert Xpress SARS-CoV-2/FLU/RSV plus assay is intended as an aid in the diagnosis of influenza from Nasopharyngeal swab specimens and should not be used as a sole basis for treatment. Nasal washings and aspirates are unacceptable for Xpert Xpress SARS-CoV-2/FLU/RSV testing.  Fact Sheet for Patients: EntrepreneurPulse.com.au  Fact Sheet for Healthcare Providers: IncredibleEmployment.be  This test is not yet approved or cleared by the [place] FDA and has been authorized for detection and/or diagnosis of  SARS-CoV-2 by FDA under an Emergency Use Authorization (EUA). This EUA will remain in effect (meaning this test can be used) for the duration of the COVID-19 declaration under Section 564(b)(1) of the Act, 21 U.S.C. section 360bbb-3(b)(1), unless the authorization is terminated or revoked.  Performed at [facility], [place] [address], [place], [place] [address]    Sodium 01/31/2022 141  135 - 145 mmol/L Final   Potassium 01/31/2022 4.0  3.5 - 5.1 mmol/L Final   Chloride 01/31/2022 107  98 - 111 mmol/L Final   CO2 01/31/2022 27  22 - 32 mmol/L Final   Glucose, Bld 01/31/2022 72  70 - 99 mg/dL Final   Glucose reference range applies only to samples taken after fasting for at least 8 hours.   BUN 01/31/2022 10  6 - 20 mg/dL Final   Creatinine, Ser 01/31/2022 1.13  0.61 - 1.24 mg/dL Final   Calcium 01/31/2022 9.7  8.9 - 10.3 mg/dL Final   Total Protein 01/31/2022 7.2  6.5 - 8.1 g/dL Final   Albumin 01/31/2022 3.9  3.5 - 5.0 g/dL Final   AST 01/31/2022 40  15 - 41 U/L Final   ALT 01/31/2022 144 (H)  0 - 44 U/L Final   Alkaline Phosphatase 01/31/2022 69  38 - 126 U/L Final   Total Bilirubin 01/31/2022 0.6  0.3 - 1.2 mg/dL Final   GFR, Estimated 01/31/2022 >60  >60 mL/min Final   Comment: (NOTE) Calculated using the CKD-EPI Creatinine Equation (2021)    Anion gap 01/31/2022 7  5 - 15 Final   Performed at [facility] [address], [place], [place] [address]   Alcohol, Ethyl (B) 01/31/2022 <10  <10 mg/dL Final   Comment: (NOTE) Lowest detectable limit for serum alcohol is 10 mg/dL.  For medical purposes only. Performed at [facility], [place] [address] [place], [place] [address]    Opiates 01/31/2022 POSITIVE (A)  NONE DETECTED Final   Cocaine 01/31/2022 POSITIVE (A)  NONE DETECTED Final   Benzodiazepines 01/31/2022 NONE DETECTED  NONE DETECTED Final   Amphetamines 01/31/2022 POSITIVE (A)  NONE DETECTED Final   Tetrahydrocannabinol 01/31/2022 NONE DETECTED  NONE  DETECTED Final   Barbiturates 01/31/2022 NONE DETECTED  NONE DETECTED Final   Comment: (NOTE) DRUG SCREEN FOR MEDICAL PURPOSES ONLY.  IF CONFIRMATION IS NEEDED FOR ANY PURPOSE, NOTIFY LAB WITHIN 5 DAYS.  LOWEST DETECTABLE LIMITS FOR URINE DRUG SCREEN Drug Class                     Cutoff (ng/mL) Amphetamine and metabolites    1000 Barbiturate and metabolites    200 Benzodiazepine                 932 Tricyclics and metabolites     300 Opiates and metabolites        300 Cocaine and metabolites        300 THC  50 Performed at [facility], [place] [address], [place], [place] [address]    WBC 01/31/2022 7.5  4.0 - 10.5 K/uL Final   RBC 01/31/2022 4.51  4.22 - 5.81 MIL/uL Final   Hemoglobin 01/31/2022 12.2 (L)  13.0 - 17.0 g/dL Final   HCT 01/31/2022 34.9 (L)  39.0 - 52.0 % Final   MCV 01/31/2022 77.4 (L)  80.0 - 100.0 fL Final   MCH 01/31/2022 27.1  26.0 - 34.0 pg Final   MCHC 01/31/2022 35.0  30.0 - 36.0 g/dL Final   RDW 01/31/2022 16.4 (H)  11.5 - 15.5 % Final   Platelets 01/31/2022 330  150 - 400 K/uL Final   nRBC 01/31/2022 0.0  0.0 - 0.2 % Final   Neutrophils Relative % 01/31/2022 65  % Final   Neutro Abs 01/31/2022 4.8  1.7 - 7.7 K/uL Final   Lymphocytes Relative 01/31/2022 26  % Final   Lymphs Abs 01/31/2022 1.9  0.7 - 4.0 K/uL Final   Monocytes Relative 01/31/2022 8  % Final   Monocytes Absolute 01/31/2022 0.6  0.1 - 1.0 K/uL Final   Eosinophils Relative 01/31/2022 1  % Final   Eosinophils Absolute 01/31/2022 0.1  0.0 - 0.5 K/uL Final   Basophils Relative 01/31/2022 0  % Final   Basophils Absolute 01/31/2022 0.0  0.0 - 0.1 K/uL Final   Immature Granulocytes 01/31/2022 0  % Final   Abs Immature Granulocytes 01/31/2022 0.02  0.00 - 0.07 K/uL Final   Performed at [facility], [place] [address] [place], [place] [address]   Acetaminophen (Tylenol), Serum 01/31/2022 <10 (L)  10 - 30 ug/mL Final   Comment: (NOTE) Therapeutic concentrations  vary significantly. A range of 10-30 ug/mL  may be an effective concentration for many patients. However, some  are best treated at concentrations outside of this range. Acetaminophen concentrations >150 ug/mL at 4 hours after ingestion  and >50 ug/mL at 12 hours after ingestion are often associated with  toxic reactions.  Performed at [facility], [place] [address] [place], [place] [address]    Salicylate Lvl 01/60/1093 <7.0 (L)  7.0 - 30.0 mg/dL Final   Performed at [place] [address] [place], [place] [address]   Total CK 01/31/2022 347  49 - 397 U/L Final   Performed at [facility], [place] [address], [place], [place] [address]  Admission on 01/29/2022, Discharged on 01/30/2022  Component Date Value Ref Range Status   Sodium 01/30/2022 140  135 - 145 mmol/L Final   Potassium 01/30/2022 3.9  3.5 - 5.1 mmol/L Final   Chloride 01/30/2022 110  98 - 111 mmol/L Final   CO2 01/30/2022 24  22 - 32 mmol/L Final   Glucose, Bld 01/30/2022 83  70 - 99 mg/dL Final   Glucose reference range applies only to samples taken after fasting for at least 8 hours.   BUN 01/30/2022 16  6 - 20 mg/dL Final   Creatinine, Ser 01/30/2022 0.86  0.61 - 1.24 mg/dL Final   Calcium 01/30/2022 8.1 (L)  8.9 - 10.3 mg/dL Final   Total Protein 01/30/2022 6.6  6.5 - 8.1 g/dL Final   Albumin 01/30/2022 3.6  3.5 - 5.0 g/dL Final   AST 01/30/2022 53 (H)  15 - 41 U/L Final   ALT 01/30/2022 191 (H)  0 - 44 U/L Final   Alkaline Phosphatase 01/30/2022 64  38 - 126 U/L Final   Total  Bilirubin 01/30/2022 0.7  0.3 - 1.2 mg/dL Final   GFR, Estimated 01/30/2022 >60  >60 mL/min Final   Comment: (NOTE) Calculated using the CKD-EPI Creatinine Equation (2021)    Anion gap 01/30/2022 6  5 - 15 Final   Performed at [facility], [place] [address], [place], [place] [address]   Salicylate Lvl 86/76/7209 <7.0 (L)  7.0 - 30.0 mg/dL Final   Performed at [place]  [address] [place], [place] [address]   Acetaminophen (Tylenol), Serum 01/30/2022 <10 (L)  10 - 30 ug/mL Final   Comment: (NOTE) Therapeutic concentrations vary significantly. A range of 10-30 ug/mL  may be an effective concentration for many patients. However, some  are best treated at concentrations outside of this range. Acetaminophen concentrations >150 ug/mL at 4 hours after ingestion  and >50 ug/mL at 12 hours after ingestion are often associated with  toxic reactions.  Performed at [facility], [place] [address], [place], [place] [address]    Alcohol, Ethyl (B) 01/30/2022 <10  <10 mg/dL Final   Comment: (NOTE) Lowest detectable limit for serum alcohol is 10 mg/dL.  For medical purposes only. Performed at [facility], [place] [address], [place], [place] [address]    WBC 01/30/2022 7.4  4.0 - 10.5 K/uL Final   RBC 01/30/2022 4.49  4.22 - 5.81 MIL/uL Final   Hemoglobin 01/30/2022 12.3 (L)  13.0 - 17.0 g/dL Final   HCT 01/30/2022 35.0 (L)  39.0 - 52.0 % Final   MCV 01/30/2022 78.0 (L)  80.0 - 100.0 fL Final   MCH 01/30/2022 27.4  26.0 - 34.0 pg Final   MCHC 01/30/2022 35.1  30.0 - 36.0 g/dL Final   RDW 01/30/2022 16.3 (H)  11.5 - 15.5 % Final   Platelets 01/30/2022 287  150 - 400 K/uL Final   nRBC 01/30/2022 0.0  0.0 - 0.2 % Final   Neutrophils Relative % 01/30/2022 58  % Final   Neutro Abs 01/30/2022 4.3  1.7 - 7.7 K/uL Final   Lymphocytes Relative 01/30/2022 31  % Final   Lymphs Abs 01/30/2022 2.3  0.7 - 4.0 K/uL Final   Monocytes Relative 01/30/2022 8  % Final   Monocytes Absolute 01/30/2022 0.6  0.1 - 1.0 K/uL Final   Eosinophils Relative 01/30/2022 2  % Final   Eosinophils Absolute 01/30/2022 0.1  0.0 - 0.5 K/uL Final   Basophils Relative 01/30/2022 1  % Final   Basophils Absolute 01/30/2022 0.1  0.0 - 0.1 K/uL Final   Immature Granulocytes 01/30/2022 0  % Final   Abs Immature Granulocytes 01/30/2022 0.03  0.00 - 0.07 K/uL Final    Performed at [facility], [place] [address], [place], [place] [address]   Total CK 01/30/2022 759 (H)  49 - 397 U/L Final   Performed at [facility], [place] [address] [place], [place] [address]  Admission on 01/27/2022, Discharged on 01/27/2022  Component Date Value Ref Range Status   Color, Urine 01/27/2022 YELLOW  YELLOW Final   APPearance 01/27/2022 TURBID (A)  CLEAR Final   Specific Gravity, Urine 01/27/2022 1.021  1.005 - 1.030 Final   pH 01/27/2022 7.0  5.0 - 8.0 Final   Glucose, UA 01/27/2022 50 (A)  NEGATIVE mg/dL Final   Hgb urine dipstick 01/27/2022 NEGATIVE  NEGATIVE Final   Bilirubin Urine 01/27/2022 NEGATIVE  NEGATIVE Final   Ketones, ur 01/27/2022 NEGATIVE  NEGATIVE mg/dL Final   Protein,  ur 01/27/2022 NEGATIVE  NEGATIVE mg/dL Final   Nitrite 01/27/2022 NEGATIVE  NEGATIVE Final   Leukocytes,Ua 01/27/2022 NEGATIVE  NEGATIVE Final   RBC / HPF 01/27/2022 0-5  0 - 5 RBC/hpf Final   Bacteria, UA 01/27/2022 NONE SEEN  NONE SEEN Final   Mucus 01/27/2022 PRESENT   Final   Performed at [facility], [place] [address], [place], [place] [address]   Sodium 01/27/2022 138  135 - 145 mmol/L Final   Potassium 01/27/2022 4.2  3.5 - 5.1 mmol/L Final   Chloride 01/27/2022 105  98 - 111 mmol/L Final   CO2 01/27/2022 27  22 - 32 mmol/L Final   Glucose, Bld 01/27/2022 87  70 - 99 mg/dL Final   Glucose reference range applies only to samples taken after fasting for at least 8 hours.   BUN 01/27/2022 14  6 - 20 mg/dL Final   Creatinine, Ser 01/27/2022 0.93  0.61 - 1.24 mg/dL Final   Calcium 01/27/2022 9.4  8.9 - 10.3 mg/dL Final   GFR, Estimated 01/27/2022 >60  >60 mL/min Final   Comment: (NOTE) Calculated using the CKD-EPI Creatinine Equation (2021)    Anion gap 01/27/2022 6  5 - 15 Final   Performed at [facility], [place] [address], [place], [place] [address]   WBC 01/27/2022 7.6  4.0 - 10.5 K/uL Final   RBC 01/27/2022 4.67  4.22 - 5.81 MIL/uL  Final   Hemoglobin 01/27/2022 12.3 (L)  13.0 - 17.0 g/dL Final   HCT 01/27/2022 35.9 (L)  39.0 - 52.0 % Final   MCV 01/27/2022 76.9 (L)  80.0 - 100.0 fL Final   MCH 01/27/2022 26.3  26.0 - 34.0 pg Final   MCHC 01/27/2022 34.3  30.0 - 36.0 g/dL Final   RDW 01/27/2022 16.1 (H)  11.5 - 15.5 % Final   Platelets 01/27/2022 329  150 - 400 K/uL Final   nRBC 01/27/2022 0.0  0.0 - 0.2 % Final   Performed at [facility], [place] [address] [place], [place] [address]   Glucose-Capillary 01/27/2022 70  70 - 99 mg/dL Final   Glucose reference range applies only to samples taken after fasting for at least 8 hours.   Opiates 01/27/2022 NONE DETECTED  NONE DETECTED Final   Cocaine 01/27/2022 POSITIVE (A)  NONE DETECTED Final   Benzodiazepines 01/27/2022 NONE DETECTED  NONE DETECTED Final   Amphetamines 01/27/2022 POSITIVE (A)  NONE DETECTED Final   Tetrahydrocannabinol 01/27/2022 NONE DETECTED  NONE DETECTED Final   Barbiturates 01/27/2022 NONE DETECTED  NONE DETECTED Final   Comment: (NOTE) DRUG SCREEN FOR MEDICAL PURPOSES ONLY.  IF CONFIRMATION IS NEEDED FOR ANY PURPOSE, NOTIFY LAB WITHIN 5 DAYS.  LOWEST DETECTABLE LIMITS FOR URINE DRUG SCREEN Drug Class                     Cutoff (ng/mL) Amphetamine and metabolites    1000 Barbiturate and metabolites    200 Benzodiazepine                 540 Tricyclics and metabolites     300 Opiates and metabolites        300 Cocaine and metabolites        300 THC                            50 Performed at [facility], [place] [address]    Alcohol, Ethyl (B) 01/27/2022 <10  <10 mg/dL Final   Comment: (NOTE) Lowest detectable limit for serum alcohol is 10 mg/dL.  For medical purposes only. Performed at [facility], [place] [address], [place], [place] [address]    Total Protein 01/27/2022 7.0  6.5 - 8.1 g/dL Final   Albumin 01/27/2022 3.7  3.5 - 5.0 g/dL Final   AST 01/27/2022 90 (H)  15 - 41 U/L Final   ALT  01/27/2022 401 (H)  0 - 44 U/L Final   Alkaline Phosphatase 01/27/2022 80  38 - 126 U/L Final   Total Bilirubin 01/27/2022 0.5  0.3 - 1.2 mg/dL Final   Bilirubin, Direct 01/27/2022 0.1  0.0 - 0.2 mg/dL Final   Indirect Bilirubin 01/27/2022 0.4  0.3 - 0.9 mg/dL Final   Performed at [facility], [place] [address], [place], [place] [address]   Lipase 01/27/2022 23  11 - 51 U/L Final   Performed at [place] [address], [place], [place] [address]  Admission on 01/26/2022, Discharged on 01/26/2022  Component Date Value Ref Range Status   Alcohol, Ethyl (B) 01/26/2022 <10  <10 mg/dL Final   Comment: (NOTE) Lowest detectable limit for serum alcohol is 10 mg/dL.  For medical purposes only. Performed at [facility], [place] [address] [place], [place] [address]    WBC 01/26/2022 7.7  4.0 - 10.5 K/uL Final   RBC 01/26/2022 4.25  4.22 - 5.81 MIL/uL Final   Hemoglobin 01/26/2022 11.4 (L)  13.0 - 17.0 g/dL Final   HCT 01/26/2022 32.0 (L)  39.0 - 52.0 % Final   MCV 01/26/2022 75.3 (L)  80.0 - 100.0 fL Final   MCH 01/26/2022 26.8  26.0 - 34.0 pg Final   MCHC 01/26/2022 35.6  30.0 - 36.0 g/dL Final   RDW 01/26/2022 15.9 (H)  11.5 - 15.5 % Final   Platelets 01/26/2022 293  150 - 400 K/uL Final   nRBC 01/26/2022 0.0  0.0 - 0.2 % Final   Neutrophils Relative % 01/26/2022 61  % Final   Neutro Abs 01/26/2022 4.7  1.7 - 7.7 K/uL Final   Lymphocytes Relative 01/26/2022 27  % Final   Lymphs Abs 01/26/2022 2.1  0.7 - 4.0 K/uL Final   Monocytes Relative 01/26/2022 10  % Final   Monocytes Absolute 01/26/2022 0.7  0.1 - 1.0 K/uL Final   Eosinophils Relative 01/26/2022 1  % Final   Eosinophils Absolute 01/26/2022 0.1  0.0 - 0.5 K/uL Final   Basophils Relative 01/26/2022 1  % Final   Basophils Absolute 01/26/2022 0.0  0.0 - 0.1 K/uL Final   Immature Granulocytes 01/26/2022 0  % Final   Abs Immature Granulocytes 01/26/2022 0.03  0.00 - 0.07 K/uL Final   Performed at [facility], [place] [address], [place], [place] [address]   Sodium 01/26/2022 137  135 - 145 mmol/L Final   Potassium 01/26/2022 3.8  3.5 - 5.1 mmol/L Final   Chloride 01/26/2022 103  98 - 111 mmol/L Final   CO2 01/26/2022 28  22 - 32 mmol/L Final   Glucose, Bld 01/26/2022 89  70 - 99 mg/dL Final   Glucose reference range applies only to samples taken after fasting for at least 8 hours.   BUN 01/26/2022 20  6 - 20 mg/dL Final   Creatinine, Ser 01/26/2022 1.06  0.61 - 1.24 mg/dL Final   Calcium 01/26/2022 8.7 (L)  8.9 - 10.3 mg/dL Final   Total Protein 01/26/2022 7.1  6.5 - 8.1 g/dL Final   Albumin 01/26/2022 3.7  3.5 - 5.0 g/dL Final   AST 01/26/2022 110 (H)  15 - 41 U/L Final   ALT 01/26/2022 497 (H)  0 - 44 U/L Final   Alkaline Phosphatase 01/26/2022 71  38 - 126 U/L Final   Total Bilirubin 01/26/2022 0.9  0.3 - 1.2 mg/dL Final   GFR, Estimated 01/26/2022 >60  >60 mL/min Final   Comment: (NOTE) Calculated using the CKD-EPI Creatinine Equation (2021)    Anion gap 01/26/2022 6  5 - 15 Final   Performed at [facility], [place] [address], [place], [place] [address]  Admission on 01/16/2022, Discharged on 01/22/2022  Component Date Value Ref Range Status   HCV Quantitative 01/17/2022 847,000  >50 IU/mL Final   HCV Quantitative Log 01/17/2022 5.928  >1.70 log10 IU/mL Final   Test Information 01/17/2022 Comment   Final   Comment: (NOTE) The quantitative range of this assay is 15 IU/mL to 100 million IU/mL. Performed At: [facility] [place], [place] [id number] Rush Farmer MD EG:[id number]    Total Protein 01/17/2022 6.6  6.5 - 8.1 g/dL Final   Albumin 01/17/2022 3.5  3.5 - 5.0 g/dL Final   AST 01/17/2022 368 (H)  15 - 41 U/L Final   ALT 01/17/2022 798 (H)  0 - 44 U/L Final   Alkaline Phosphatase 01/17/2022 101  38 - 126 U/L Final   Total Bilirubin 01/17/2022 0.9  0.3 - 1.2 mg/dL Final   Bilirubin, Direct 01/17/2022 0.1  0.0 -  0.2 mg/dL Final   Indirect Bilirubin 01/17/2022 0.8  0.3 - 0.9 mg/dL Final   Performed at [facility], [place] [address], [place], [place] [address]   HIV Screen 4th Generation wRfx 01/17/2022 Non Reactive  Non Reactive Final   Performed at [facility], [place] [address], [place], [place] [address]   Prothrombin Time 01/18/2022 12.7  11.4 - 15.2 seconds Final   INR 01/18/2022 1.0  0.8 - 1.2 Final   Comment: (NOTE) INR goal varies based on device and disease states. Performed at [facility], [place] [address] [place], [place] [address]    Acetaminophen (Tylenol), Serum 01/18/2022 <10 (L)  10 - 30 ug/mL Final   Comment: (NOTE) Therapeutic concentrations vary significantly. A range of 10-30 ug/mL  may be an effective concentration for many patients. However, some  are best treated at concentrations outside of this range. Acetaminophen concentrations >150 ug/mL at 4 hours after ingestion  and >50 ug/mL at 12 hours after ingestion are often associated with  toxic reactions.  Performed at [facility], [place] [address], [place], [place] [address]    Anti Nuclear Antibody (ANA) 01/18/2022 Negative  Negative Final   Comment: (NOTE) Performed At: [facility] [place], [place] [id number] Rush Farmer MD ZJ:[id number]    F-Actin IgG 01/18/2022 10  0 - 19 Units Final   Comment: (NOTE)                 Negative                     0 - 19                 Weak positive               20 -  30                 Moderate to strong positive     >30 Actin Antibodies are found in 52-85% of patients with autoimmune hepatitis or chronic active hepatitis and in 22% of patients with primary biliary cirrhosis. Performed At: [facility] [place], [place] [id number] Rush Farmer MD RW:[id number]    IgG (Immunoglobin G), Serum 01/18/2022 1,335  603 - 1,613 mg/dL Final   IgA 01/18/2022 163  90 - 386  mg/dL Final   IgM (Immunoglobulin M), Srm 01/18/2022 327 (H)  20 - 172 mg/dL Final   Comment: (NOTE) Performed At: [facility] [place], [place] [id number] Rush Farmer MD ZT:[id number]    Mitochondrial M2 Ab, IgG 01/18/2022 27.6 (H)  0.0 - 20.0 Units Final   Comment: (NOTE)                                Negative    0.0 - 20.0                                Equivocal  20.1 - 24.9                                Positive         >24.9 Mitochondrial (M2) Antibodies are found in 90-96% of patients with primary biliary cirrhosis. Performed At: [facility] [place], [place] [id number] Rush Farmer MD BH:[id number]    Total Protein 01/19/2022 7.5  6.5 - 8.1 g/dL Final   Albumin 01/19/2022 3.8  3.5 - 5.0 g/dL Final   AST 01/19/2022 318 (H)  15 - 41 U/L Final   ALT 01/19/2022 847 (H)  0 - 44 U/L Final   Alkaline Phosphatase 01/19/2022 98  38 - 126 U/L Final   Total Bilirubin 01/19/2022 0.8  0.3 - 1.2 mg/dL Final   Bilirubin, Direct 01/19/2022 0.1  0.0 - 0.2 mg/dL Final   Indirect Bilirubin 01/19/2022 0.7  0.3 - 0.9 mg/dL Final   Performed at [facility], [place] [address], [place], [place] [address]   Total Protein 01/21/2022 7.4  6.5 - 8.1 g/dL Final   Albumin 01/21/2022 3.7  3.5 - 5.0 g/dL Final   AST 01/21/2022 371 (H)  15 - 41 U/L Final   ALT 01/21/2022 947 (H)  0 - 44 U/L Final   Alkaline Phosphatase 01/21/2022 91  38 - 126 U/L Final   Total Bilirubin 01/21/2022 0.8  0.3 - 1.2 mg/dL Final   Bilirubin, Direct 01/21/2022 0.1  0.0 - 0.2 mg/dL Final   Indirect Bilirubin 01/21/2022 0.7  0.3 - 0.9 mg/dL Final   Performed at [facility], [place] [address] [place], [place] [address]   Prothrombin Time 01/21/2022 12.6  11.4 - 15.2 seconds Final   INR 01/21/2022 1.0  0.8 - 1.2 Final   Comment: (NOTE) INR goal varies based on device and disease states. Performed at [facility], [place]  [address], [place],  [address]   Admission on 01/14/2022, Discharged on 01/16/2022  Component Date Value Ref Range Status   WBC 01/15/2022 4.9  4.0 - 10.5 K/uL Final   RBC 01/15/2022 4.24  4.22 - 5.81 MIL/uL Final   Hemoglobin 01/15/2022  11.0 (L)  13.0 - 17.0 g/dL Final   HCT 01/15/2022 32.1 (L)  39.0 - 52.0 % Final   MCV 01/15/2022 75.7 (L)  80.0 - 100.0 fL Final   MCH 01/15/2022 25.9 (L)  26.0 - 34.0 pg Final   MCHC 01/15/2022 34.3  30.0 - 36.0 g/dL Final   RDW 01/15/2022 14.8  11.5 - 15.5 % Final   Platelets 01/15/2022 323  150 - 400 K/uL Final   nRBC 01/15/2022 0.0  0.0 - 0.2 % Final   Neutrophils Relative % 01/15/2022 53  % Final   Neutro Abs 01/15/2022 2.6  1.7 - 7.7 K/uL Final   Lymphocytes Relative 01/15/2022 32  % Final   Lymphs Abs 01/15/2022 1.6  0.7 - 4.0 K/uL Final   Monocytes Relative 01/15/2022 11  % Final   Monocytes Absolute 01/15/2022 0.5  0.1 - 1.0 K/uL Final   Eosinophils Relative 01/15/2022 3  % Final   Eosinophils Absolute 01/15/2022 0.1  0.0 - 0.5 K/uL Final   Basophils Relative 01/15/2022 1  % Final   Basophils Absolute 01/15/2022 0.0  0.0 - 0.1 K/uL Final   Immature Granulocytes 01/15/2022 0  % Final   Abs Immature Granulocytes 01/15/2022 0.01  0.00 - 0.07 K/uL Final   Performed at [facility], [place] [address], [place], [place] [address]   Alcohol, Ethyl (B) 01/15/2022 <10  <10 mg/dL Final   Comment: (NOTE) Lowest detectable limit for serum alcohol is 10 mg/dL.  For medical purposes only. Performed at [facility], [place] [address], [place], [place] [address]    Sodium 01/15/2022 139  135 - 145 mmol/L Final   Potassium 01/15/2022 3.8  3.5 - 5.1 mmol/L Final   Chloride 01/15/2022 107  98 - 111 mmol/L Final   CO2 01/15/2022 27  22 - 32 mmol/L Final   Glucose, Bld 01/15/2022 97  70 - 99 mg/dL Final   Glucose reference range applies only to samples taken after fasting for at least 8 hours.   BUN 01/15/2022 16  6 -  20 mg/dL Final   Creatinine, Ser 01/15/2022 1.09  0.61 - 1.24 mg/dL Final   Calcium 01/15/2022 9.0  8.9 - 10.3 mg/dL Final   GFR, Estimated 01/15/2022 >60  >60 mL/min Final   Comment: (NOTE) Calculated using the CKD-EPI Creatinine Equation (2021)    Anion gap 01/15/2022 5  5 - 15 Final   Performed at [facility], [place] [address], [place], [place] [address]   Color, Urine 01/15/2022 YELLOW  YELLOW Final   APPearance 01/15/2022 CLOUDY (A)  CLEAR Final   Specific Gravity, Urine 01/15/2022 1.020  1.005 - 1.030 Final   pH 01/15/2022 6.0  5.0 - 8.0 Final   Glucose, UA 01/15/2022 NEGATIVE  NEGATIVE mg/dL Final   Hgb urine dipstick 01/15/2022 NEGATIVE  NEGATIVE Final   Bilirubin Urine 01/15/2022 NEGATIVE  NEGATIVE Final   Ketones, ur 01/15/2022 NEGATIVE  NEGATIVE mg/dL Final   Protein, ur 01/15/2022 NEGATIVE  NEGATIVE mg/dL Final   Nitrite 01/15/2022 NEGATIVE  NEGATIVE Final   Leukocytes,Ua 01/15/2022 NEGATIVE  NEGATIVE Final   Performed at [place] [address], [place], [place] [address]   Opiates 01/15/2022 NONE DETECTED  NONE DETECTED Final   Cocaine 01/15/2022 POSITIVE (A)  NONE DETECTED Final   Benzodiazepines 01/15/2022 POSITIVE (A)  NONE DETECTED Final   Amphetamines 01/15/2022 POSITIVE (A)  NONE DETECTED Final   Tetrahydrocannabinol 01/15/2022 POSITIVE (A)  NONE DETECTED Final  Barbiturates 01/15/2022 NONE DETECTED  NONE DETECTED Final   Comment: (NOTE) DRUG SCREEN FOR MEDICAL PURPOSES ONLY.  IF CONFIRMATION IS NEEDED FOR ANY PURPOSE, NOTIFY LAB WITHIN 5 DAYS.  LOWEST DETECTABLE LIMITS FOR URINE DRUG SCREEN Drug Class                     Cutoff (ng/mL) Amphetamine and metabolites    1000 Barbiturate and metabolites    200 Benzodiazepine                 774 Tricyclics and metabolites     300 Opiates and metabolites        300 Cocaine and metabolites        300 THC                            50 Performed at [facility], [place] [address], [place], [place] [address]    SARS Coronavirus 2 by RT PCR 01/15/2022 NEGATIVE  NEGATIVE Final   Comment: (NOTE) SARS-CoV-2 target nucleic acids are NOT DETECTED.  The SARS-CoV-2 RNA is generally detectable in upper respiratory specimens during the acute phase of infection. The lowest concentration of SARS-CoV-2 viral copies this assay can detect is 138 copies/mL. A negative result does not preclude SARS-Cov-2 infection and should not be used as the sole basis for treatment or other patient management decisions. A negative result may occur with  improper specimen collection/handling, submission of specimen other than nasopharyngeal swab, presence of viral mutation(s) within the areas targeted by this assay, and inadequate number of viral copies(<138 copies/mL). A negative result must be combined with clinical observations, patient history, and epidemiological information. The expected result is Negative.  Fact Sheet for Patients:  EntrepreneurPulse.com.au  Fact Sheet for Healthcare Providers:  IncredibleEmployment.be  This test is no                          t yet approved or cleared by the [place] FDA and  has been authorized for detection and/or diagnosis of SARS-CoV-2 by FDA under an Emergency Use Authorization (EUA). This EUA will remain  in effect (meaning this test can be used) for the duration of the COVID-19 declaration under Section 564(b)(1) of the Act, 21 U.S.C.section 360bbb-3(b)(1), unless the authorization is terminated  or revoked sooner.       Influenza A by PCR 01/15/2022 NEGATIVE  NEGATIVE Final   Influenza B by PCR 01/15/2022 NEGATIVE  NEGATIVE Final   Comment: (NOTE) The Xpert Xpress SARS-CoV-2/FLU/RSV plus assay is intended as an aid in the diagnosis of influenza from Nasopharyngeal swab specimens and should not be used as a sole basis for treatment. Nasal washings and aspirates are  unacceptable for Xpert Xpress SARS-CoV-2/FLU/RSV testing.  Fact Sheet for Patients: EntrepreneurPulse.com.au  Fact Sheet for Healthcare Providers: IncredibleEmployment.be  This test is not yet approved or cleared by the [place] FDA and has been authorized for detection and/or diagnosis of SARS-CoV-2 by FDA under an Emergency Use Authorization (EUA). This EUA will remain in effect (meaning this test can be used) for the duration of the COVID-19 declaration under Section 564(b)(1) of the Act, 21 U.S.C. section 360bbb-3(b)(1), unless the authorization is terminated or revoked.  Performed at [facility], [place] [address], [place], [place] [address]   Admission on 01/07/2022, Discharged on 01/09/2022  Component Date Value Ref Range Status   POC  Amphetamine UR 01/07/2022 Positive (A)  NONE DETECTED (Cut Off Level 1000 ng/mL) Final   POC Secobarbital (BAR) 01/07/2022 None Detected  NONE DETECTED (Cut Off Level 300 ng/mL) Final   POC Buprenorphine (BUP) 01/07/2022 None Detected  NONE DETECTED (Cut Off Level 10 ng/mL) Final   POC Oxazepam (BZO) 01/07/2022 Positive (A)  NONE DETECTED (Cut Off Level 300 ng/mL) Final   POC Cocaine UR 01/07/2022 Positive (A)  NONE DETECTED (Cut Off Level 300 ng/mL) Final   POC Methamphetamine UR 01/07/2022 Positive (A)  NONE DETECTED (Cut Off Level 1000 ng/mL) Final   POC Morphine 01/07/2022 None Detected  NONE DETECTED (Cut Off Level 300 ng/mL) Final   POC Oxycodone UR 01/07/2022 None Detected  NONE DETECTED (Cut Off Level 100 ng/mL) Final   POC Methadone UR 01/07/2022 None Detected  NONE DETECTED (Cut Off Level 300 ng/mL) Final   POC Marijuana UR 01/07/2022 Positive (A)  NONE DETECTED (Cut Off Level 50 ng/mL) Final   SARS Coronavirus 2 by RT PCR 01/07/2022 NEGATIVE  NEGATIVE Final   Comment: (NOTE) SARS-CoV-2 target nucleic acids are NOT DETECTED.  The SARS-CoV-2 RNA is generally detectable in upper  respiratory specimens during the acute phase of infection. The lowest concentration of SARS-CoV-2 viral copies this assay can detect is 138 copies/mL. A negative result does not preclude SARS-Cov-2 infection and should not be used as the sole basis for treatment or other patient management decisions. A negative result may occur with  improper specimen collection/handling, submission of specimen other than nasopharyngeal swab, presence of viral mutation(s) within the areas targeted by this assay, and inadequate number of viral copies(<138 copies/mL). A negative result must be combined with clinical observations, patient history, and epidemiological information. The expected result is Negative.  Fact Sheet for Patients:  EntrepreneurPulse.com.au  Fact Sheet for Healthcare Providers:  IncredibleEmployment.be  This test is no                          t yet approved or cleared by the [place] FDA and  has been authorized for detection and/or diagnosis of SARS-CoV-2 by FDA under an Emergency Use Authorization (EUA). This EUA will remain  in effect (meaning this test can be used) for the duration of the COVID-19 declaration under Section 564(b)(1) of the Act, 21 U.S.C.section 360bbb-3(b)(1), unless the authorization is terminated  or revoked sooner.       Influenza A by PCR 01/07/2022 NEGATIVE  NEGATIVE Final   Influenza B by PCR 01/07/2022 NEGATIVE  NEGATIVE Final   Comment: (NOTE) The Xpert Xpress SARS-CoV-2/FLU/RSV plus assay is intended as an aid in the diagnosis of influenza from Nasopharyngeal swab specimens and should not be used as a sole basis for treatment. Nasal washings and aspirates are unacceptable for Xpert Xpress SARS-CoV-2/FLU/RSV testing.  Fact Sheet for Patients: EntrepreneurPulse.com.au  Fact Sheet for Healthcare Providers: IncredibleEmployment.be  This test is not yet approved or  cleared by the [place] FDA and has been authorized for detection and/or diagnosis of SARS-CoV-2 by FDA under an Emergency Use Authorization (EUA). This EUA will remain in effect (meaning this test can be used) for the duration of the COVID-19 declaration under Section 564(b)(1) of the Act, 21 U.S.C. section 360bbb-3(b)(1), unless the authorization is terminated or revoked.  Performed at [facility], [place] [address] [place], [place] [address]    WBC 01/07/2022 6.0  4.0 - 10.5 K/uL Final   RBC 01/07/2022 4.75  4.22 -  5.81 MIL/uL Final   Hemoglobin 01/07/2022 12.4 (L)  13.0 - 17.0 g/dL Final   HCT 01/07/2022 36.7 (L)  39.0 - 52.0 % Final   MCV 01/07/2022 77.3 (L)  80.0 - 100.0 fL Final   MCH 01/07/2022 26.1  26.0 - 34.0 pg Final   MCHC 01/07/2022 33.8  30.0 - 36.0 g/dL Final   RDW 01/07/2022 14.8  11.5 - 15.5 % Final   Platelets 01/07/2022 357  150 - 400 K/uL Final   nRBC 01/07/2022 0.0  0.0 - 0.2 % Final   Neutrophils Relative % 01/07/2022 64  % Final   Neutro Abs 01/07/2022 3.9  1.7 - 7.7 K/uL Final   Lymphocytes Relative 01/07/2022 26  % Final   Lymphs Abs 01/07/2022 1.6  0.7 - 4.0 K/uL Final   Monocytes Relative 01/07/2022 7  % Final   Monocytes Absolute 01/07/2022 0.4  0.1 - 1.0 K/uL Final   Eosinophils Relative 01/07/2022 2  % Final   Eosinophils Absolute 01/07/2022 0.1  0.0 - 0.5 K/uL Final   Basophils Relative 01/07/2022 1  % Final   Basophils Absolute 01/07/2022 0.0  0.0 - 0.1 K/uL Final   Immature Granulocytes 01/07/2022 0  % Final   Abs Immature Granulocytes 01/07/2022 0.02  0.00 - 0.07 K/uL Final   Performed at [facility], [place] [address] [place], [place] [address]   Sodium 01/07/2022 139  135 - 145 mmol/L Final   Potassium 01/07/2022 4.9  3.5 - 5.1 mmol/L Final   Chloride 01/07/2022 103  98 - 111 mmol/L Final   CO2 01/07/2022 29  22 - 32 mmol/L Final   Glucose, Bld 01/07/2022 119 (H)  70 - 99 mg/dL Final   Glucose reference range applies only to  samples taken after fasting for at least 8 hours.   BUN 01/07/2022 10  6 - 20 mg/dL Final   Creatinine, Ser 01/07/2022 1.04  0.61 - 1.24 mg/dL Final   Calcium 01/07/2022 9.6  8.9 - 10.3 mg/dL Final   Total Protein 01/07/2022 6.8  6.5 - 8.1 g/dL Final   Albumin 01/07/2022 4.0  3.5 - 5.0 g/dL Final   AST 01/07/2022 325 (H)  15 - 41 U/L Final   ALT 01/07/2022 711 (H)  0 - 44 U/L Final   Alkaline Phosphatase 01/07/2022 104  38 - 126 U/L Final   Total Bilirubin 01/07/2022 0.7  0.3 - 1.2 mg/dL Final   GFR, Estimated 01/07/2022 >60  >60 mL/min Final   Comment: (NOTE) Calculated using the CKD-EPI Creatinine Equation (2021)    Anion gap 01/07/2022 7  5 - 15 Final   Performed at [facility] [address], [place], [place] [address]   Alcohol, Ethyl (B) 01/07/2022 <10  <10 mg/dL Final   Comment: (NOTE) Lowest detectable limit for serum alcohol is 10 mg/dL.  For medical purposes only. Performed at [facility], [place] [address] [place], [place] [address]    Neisseria Gonorrhea 01/07/2022 Negative   Final   Chlamydia 01/07/2022 Negative   Final   Comment 01/07/2022 Normal Reference [place] Chlamydia - Negative   Final   Comment 01/07/2022 Normal Reference Range Neisseria Gonorrhea - Negative   Final   Color, Urine 01/07/2022 YELLOW  YELLOW Final   APPearance 01/07/2022 CLOUDY (A)  CLEAR Final   Specific Gravity, Urine 01/07/2022 1.023  1.005 - 1.030 Final   pH 01/07/2022 6.0  5.0 - 8.0 Final   Glucose, UA 01/07/2022 NEGATIVE  NEGATIVE  mg/dL Final   Hgb urine dipstick 01/07/2022 NEGATIVE  NEGATIVE Final   Bilirubin Urine 01/07/2022 NEGATIVE  NEGATIVE Final   Ketones, ur 01/07/2022 NEGATIVE  NEGATIVE mg/dL Final   Protein, ur 01/07/2022 NEGATIVE  NEGATIVE mg/dL Final   Nitrite 01/07/2022 NEGATIVE  NEGATIVE Final   Leukocytes,Ua 01/07/2022 NEGATIVE  NEGATIVE Final   Performed at [facility], [place] [address], [place], [place] [address]   SARSCOV2ONAVIRUS 2 AG 01/07/2022  NEGATIVE  NEGATIVE Final   Comment: (NOTE) SARS-CoV-2 antigen NOT DETECTED.   Negative results are presumptive.  Negative results do not preclude SARS-CoV-2 infection and should not be used as the sole basis for treatment or other patient management decisions, including infection  control decisions, particularly in the presence of clinical signs and  symptoms consistent with COVID-19, or in those who have been in contact with the virus.  Negative results must be combined with clinical observations, patient history, and epidemiological information. The expected result is Negative.  Fact Sheet for Patients: HandmadeRecipes.com.cy  Fact Sheet for Healthcare Providers: FuneralLife.at  This test is not yet approved or cleared by the [place] FDA and  has been authorized for detection and/or diagnosis of SARS-CoV-2 by FDA under an Emergency Use Authorization (EUA).  This EUA will remain in effect (meaning this test can be used) for the duration of  the COV                          ID-19 declaration under Section 564(b)(1) of the Act, 21 U.S.C. section 360bbb-3(b)(1), unless the authorization is terminated or revoked sooner.     Hepatitis B Surface Ag 01/07/2022 NON REACTIVE  NON REACTIVE Final   HCV Ab 01/07/2022 Reactive (A)  NON REACTIVE Final   Comment: (NOTE) The CDC recommends that a Reactive HCV antibody result be followed up  with a HCV Nucleic Acid Amplification test.     Hep A IgM 01/07/2022 NON REACTIVE  NON REACTIVE Final   Hep B C IgM 01/07/2022 NON REACTIVE  NON REACTIVE Final   Performed at [facility], [place] [address], [place], [place] [address]  There may be more visits with results that are not included.    Allergies: Tomato  PTA Medications: (Not in a hospital admission)   Medical Decision Making  Patient says that he is unable to contract for safety at this time and continues to endorse suicidal  ideation with plans to jump off a bridge.  Patient will be admitted to [facility] for continuous assessment with plans for follow-up by psychiatry during day shift on 02/17/2019 third.  -Review available lab results -  Recommendations  Based on my evaluation the patient does not appear to have an emergency medical condition.  Ophelia Shoulder, NP 02/11/22  5:18 AM

## 2022-02-11 NOTE — ED Notes (Signed)
Pt stated he was SI with a plan "to take an officers gun. I know they will kill me when I try to take their gun". Pt stated that voices are telling him "that I am no good, kill myself, negative things". He also stated "I am having negative thoughts". Safety maintained and will continue to monitor.

## 2022-02-12 MED ORDER — OLANZAPINE 5 MG PO TBDP: 5.0000 mg | ORAL_TABLET | Freq: Three times a day (TID) | ORAL | Status: DC | PRN

## 2022-02-12 MED ORDER — LOPERAMIDE HCL 2 MG PO CAPS: 2.0000 mg | ORAL_CAPSULE | ORAL | Status: DC | PRN

## 2022-02-12 MED ORDER — NAPROXEN 500 MG PO TABS: 500.0000 mg | ORAL_TABLET | Freq: Two times a day (BID) | ORAL | Status: DC | PRN

## 2022-02-12 MED ORDER — LORAZEPAM 1 MG PO TABS
1.0000 mg | ORAL_TABLET | ORAL | Status: DC | PRN
  Filled 2022-02-12: qty 1

## 2022-02-12 MED ORDER — HYDROXYZINE HCL 25 MG PO TABS
25.0000 mg | ORAL_TABLET | Freq: Four times a day (QID) | ORAL | Status: DC | PRN
  Administered 2022-02-12 – 2022-02-13 (×2): 25 mg via ORAL
  Filled 2022-02-12 (×2): qty 1

## 2022-02-12 MED ORDER — ZIPRASIDONE MESYLATE 20 MG IM SOLR: 20.0000 mg | INTRAMUSCULAR | Status: DC | PRN

## 2022-02-12 MED ORDER — ONDANSETRON 4 MG PO TBDP: 4.0000 mg | ORAL_TABLET | Freq: Four times a day (QID) | ORAL | Status: DC | PRN

## 2022-02-12 MED ORDER — DICYCLOMINE HCL 20 MG PO TABS: 20.0000 mg | ORAL_TABLET | Freq: Four times a day (QID) | ORAL | Status: DC | PRN

## 2022-02-12 MED ORDER — METHOCARBAMOL 500 MG PO TABS: 500.0000 mg | ORAL_TABLET | Freq: Three times a day (TID) | ORAL | Status: DC | PRN

## 2022-02-12 MED ORDER — LORAZEPAM 1 MG PO TABS
1.0000 mg | ORAL_TABLET | Freq: Four times a day (QID) | ORAL | Status: DC | PRN
Start: 2022-02-12 — End: 2022-02-14
  Administered 2022-02-13: 1 mg via ORAL

## 2022-02-12 MED ORDER — THIAMINE HCL 100 MG PO TABS
100.0000 mg | ORAL_TABLET | Freq: Every day | ORAL | Status: DC
  Administered 2022-02-13 – 2022-02-14 (×2): 100 mg via ORAL
  Filled 2022-02-12 (×3): qty 1

## 2022-02-12 NOTE — Tx Team (Signed)
Initial Treatment Plan 02/12/2022 6:50 AM Leland Her Bohman [id number]    PATIENT STRESSORS: Financial difficulties   Health problems   Substance abuse     PATIENT STRENGTHS: Motivation for treatment/growth    PATIENT IDENTIFIED PROBLEMS: Substance abuse  Mood  Decline in physical health                  DISCHARGE CRITERIA:  Adequate post-discharge living arrangements Improved stabilization in mood, thinking, and/or behavior  PRELIMINARY DISCHARGE PLAN: Placement in alternative living arrangements  PATIENT/FAMILY INVOLVEMENT: This treatment plan has been presented to and reviewed with the patient, Tristan [name].  The patient and family have been given the opportunity to ask questions and make suggestions.  Azucena Cecil, RN 02/12/2022, 6:50 AM

## 2022-02-12 NOTE — Progress Notes (Signed)
   02/12/22 9983  Psych Admission Type (Psych Patients Only)  Admission Status Voluntary  Psychosocial Assessment  Patient Complaints Agitation;Anxiety;Irritability  Eye Contact Avoids  Facial Expression Flat  Affect Flat  Speech Logical/coherent  Interaction Minimal  Motor Activity Slow;Fidgety;Tremors  Appearance/Hygiene In scrubs  Behavior Characteristics Cooperative;Guarded  Mood Depressed;Anxious  Aggressive Behavior  Targets Self  Type of Behavior Unprovoked  Effect No apparent injury  Thought Process  Coherency Circumstantial  Content WDL  Delusions None reported or observed  Perception WDL  Hallucination None reported or observed  Judgment Impaired  Confusion WDL  Danger to Self  Current suicidal ideation? Denies  Self-Injurious Behavior No self-injurious ideation or behavior indicators observed or expressed   Agreement Not to Harm Self Yes  Description of Agreement Verbally contracts for safety  Danger to Others  Danger to Others None reported or observed

## 2022-02-12 NOTE — Plan of Care (Signed)
  Problem: Coping: Goal: Ability to verbalize frustrations and anger appropriately will improve Outcome: Progressing Goal: Ability to demonstrate self-control will improve Outcome: Progressing   Problem: Safety: Goal: Periods of time without injury will increase Outcome: Progressing   

## 2022-02-12 NOTE — Group Note (Signed)
Recreation Therapy Group Note   Group Topic:Animal Assisted Therapy   Group Date: 02/12/2022 Start Time: 1430 End Time: 1515 Facilitators: Victorino Sparrow, LRT,CTRS Location: [address] Dayroom   Animal-Assisted Activity (AAA) Program Checklist/Progress Note Patient Eligibility Criteria Checklist & Daily Group note for Rec Tx Intervention  AAA/T Program Assumption of Risk Form signed by Patient/ or Parent Legal Guardian YES  Patient understands their participation is voluntary YES  Group Description: Patients provided opportunity to interact with trained and credentialed [employer] dog and the community volunteer/dog handler. Patients practiced appropriate animal interaction and were educated on dog safety outside of the hospital in common community settings. Patients were allowed to use dog toys and other items to practice commands, engage the dog in play, and/or complete routine aspects of animal care.   Education: Contractor, Pensions consultant, Communication & Social Skills    Affect/Mood: N/A   Participation Level: Did not attend    Clinical Observations/Individualized Feedback:     Plan: Continue to engage patient in RT group sessions 2-3x/week.   Victorino Sparrow, Glennis Brink 02/12/2022 3:46 PM
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
## 2022-02-12 NOTE — Progress Notes (Signed)
Psychoeducational Group Note  Date:  02/12/2022 Time:  2112  Group Topic/Focus:  Wrap-Up Group:   The focus of this group is to help patients review their daily goal of treatment and discuss progress on daily workbooks.  Participation Level: Did Not Attend  Participation Quality:  Not Applicable  Affect:  Not Applicable  Cognitive:  Not Applicable  Insight:  Not Applicable  Engagement in Group: Not Applicable  Additional Comments:  The patient did not attend group this evening.   Hazle Coca S 02/12/2022, 9:13 PM

## 2022-02-12 NOTE — [facility] Group Notes (Signed)
Adult Orientation Group Note  Date:  02/12/2022 Time:  11:12 AM  Group Topic/Focus:  Orientation:   The focus of this group is to educate the patient on the purpose and policies of crisis stabilization and provide a format to answer questions about their admission.  The group details unit policies and expectations of patients while admitted.  Participation Level:  Did Not Attend   Kern Reap 02/12/2022, 11:12 AM

## 2022-02-13 ENCOUNTER — Inpatient Hospital Stay: Payer: Self-pay | Admitting: Physician Assistant

## 2022-02-13 ENCOUNTER — Encounter (HOSPITAL_COMMUNITY): Payer: Self-pay

## 2022-02-13 LAB — CK: Total CK: 197 U/L (ref 49–397)

## 2022-02-13 NOTE — Group Note (Signed)
LCSW Group Therapy Note   Group Date: 02/13/2022 Start Time: 1300 End Time: 1400   Type of Therapy and Topic:  Group Therapy: Boundaries  Participation Level:  Did Not Attend  Description of Group: This group will address the use of boundaries in their personal lives. Patients will explore why boundaries are important, the difference between healthy and unhealthy boundaries, and negative and postive outcomes of different boundaries and will look at how boundaries can be crossed.  Patients will be encouraged to identify current boundaries in their own lives and identify what kind of boundary is being set. Facilitators will guide patients in utilizing problem-solving interventions to address and correct types boundaries being used and to address when no boundary is being used. Understanding and applying boundaries will be explored and addressed for obtaining and maintaining a balanced life. Patients will be encouraged to explore ways to assertively make their boundaries and needs known to significant others in their lives, using other group members and facilitator for role play, support, and feedback.  Therapeutic Goals:  1.  Patient will identify areas in their life where setting clear boundaries could be  used to improve their life.  2.  Patient will identify signs/triggers that a boundary is not being respected. 3.  Patient will identify two ways to set boundaries in order to achieve balance in  their lives: 4.  Patient will demonstrate ability to communicate their needs and set boundaries  through discussion and/or role plays  Summary of Patient Progress:  Did not attend   Therapeutic Modalities:   Cognitive Behavioral Therapy Solution-Focused Therapy  Aram Beecham, LCSWA 02/13/2022  2:01 PM

## 2022-02-13 NOTE — BH IP Treatment Plan (Signed)
Interdisciplinary Treatment and Diagnostic Plan Update  02/13/2022 Time of Session: 9:20am  Tristan [name] MRN: [id number]  Principal Diagnosis: Bipolar affective disorder, depressed, severe, with psychotic behavior ([place])  Secondary Diagnoses: Principal Problem:   Bipolar affective disorder, depressed, severe, with psychotic behavior ([place]) Active Problems:   Polysubstance abuse ([place])   Current Medications:  Current Facility-Administered Medications  Medication Dose Route Frequency Provider Last Rate Last Admin   acetaminophen (TYLENOL) tablet 650 mg  650 mg Oral Q6H PRN Revonda Humphrey, NP       alum & mag hydroxide-simeth (MAALOX/MYLANTA) 200-200-20 MG/5ML suspension 30 mL  30 mL Oral Q4H PRN Revonda Humphrey, NP       dicyclomine (BENTYL) tablet 20 mg  20 mg Oral Q6H PRN Harlow Asa, MD       escitalopram (LEXAPRO) tablet 10 mg  10 mg Oral Daily Revonda Humphrey, NP   10 mg at 02/13/22 1761   gabapentin (NEURONTIN) capsule 100 mg  100 mg Oral TID Revonda Humphrey, NP   100 mg at 02/13/22 6073   hydrOXYzine (ATARAX) tablet 25 mg  25 mg Oral Q6H PRN Harlow Asa, MD   25 mg at 02/12/22 1925   loperamide (IMODIUM) capsule 2-4 mg  2-4 mg Oral PRN Harlow Asa, MD       LORazepam (ATIVAN) tablet 1 mg  1 mg Oral Q6H PRN Harlow Asa, MD       OLANZapine zydis (ZYPREXA) disintegrating tablet 5 mg  5 mg Oral Q8H PRN Harlow Asa, MD       And   LORazepam (ATIVAN) tablet 1 mg  1 mg Oral PRN Harlow Asa, MD       And   ziprasidone (GEODON) injection 20 mg  20 mg Intramuscular PRN Nelda Marseille, Amy E, MD       magnesium hydroxide (MILK OF MAGNESIA) suspension 30 mL  30 mL Oral Daily PRN Revonda Humphrey, NP       methocarbamol (ROBAXIN) tablet 500 mg  500 mg Oral Q8H PRN Nelda Marseille, Amy E, MD       multivitamin with minerals tablet 1 tablet  1 tablet Oral Daily Revonda Humphrey, NP   1 tablet at 02/13/22 0943   naproxen (NAPROSYN) tablet 500 mg   500 mg Oral BID PRN Harlow Asa, MD       ondansetron (ZOFRAN-ODT) disintegrating tablet 4 mg  4 mg Oral Q6H PRN Nelda Marseille, Amy E, MD       pantoprazole (PROTONIX) EC tablet 40 mg  40 mg Oral Daily Thomes Lolling H, NP   40 mg at 02/13/22 0943   QUEtiapine (SEROQUEL) tablet 100 mg  100 mg Oral QHS Revonda Humphrey, NP   100 mg at 02/12/22 2043   thiamine tablet 100 mg  100 mg Oral Daily Nelda Marseille, Amy E, MD   100 mg at 02/13/22 7106   traZODone (DESYREL) tablet 50 mg  50 mg Oral QHS PRN Revonda Humphrey, NP       PTA Medications: Medications Prior to Admission  Medication Sig Dispense Refill Last Dose   escitalopram (LEXAPRO) 10 MG tablet Take 1 tablet (10 mg total) by mouth daily. 30 tablet 0    furosemide (LASIX) 40 MG tablet Take 1 tablet (40 mg total) by mouth daily. 5 tablet 0    gabapentin (NEURONTIN) 100 MG capsule Take 1 capsule (100 mg total) by mouth 3 (three) times daily. 90 capsule 0  loratadine (CLARITIN) 10 MG tablet Take 10 mg by mouth daily as needed for allergies.      nicotine polacrilex (NICORETTE) 2 MG gum Take 1 each (2 mg total) by mouth as needed for smoking cessation. (Patient not taking: Reported on 02/01/2022) 100 tablet 0    pantoprazole (PROTONIX) 40 MG tablet Take 1 tablet (40 mg total) by mouth daily. 30 tablet 0    QUEtiapine (SEROQUEL) 100 MG tablet Take 1 tablet (100 mg total) by mouth at bedtime. 30 tablet 0     Patient Stressors: Financial difficulties   Health problems   Substance abuse    Patient Strengths: Motivation for treatment/growth   Treatment Modalities: Medication Management, Group therapy, Case management,  1 to 1 session with clinician, Psychoeducation, Recreational therapy.   Physician Treatment Plan for Primary Diagnosis: Bipolar affective disorder, depressed, severe, with psychotic behavior ([place]) Long Term Goal(s): Improvement in symptoms so as ready for discharge   Short Term Goals: Ability to identify triggers associated  with substance abuse/mental health issues will improve Ability to disclose and discuss suicidal ideas Ability to demonstrate self-control will improve Ability to identify and develop effective coping behaviors will improve  Medication Management: Evaluate patient's response, side effects, and tolerance of medication regimen.  Therapeutic Interventions: 1 to 1 sessions, Unit Group sessions and Medication administration.  Evaluation of Outcomes: Not Met  Physician Treatment Plan for Secondary Diagnosis: Principal Problem:   Bipolar affective disorder, depressed, severe, with psychotic behavior ([place]) Active Problems:   Polysubstance abuse ([place])  Long Term Goal(s): Improvement in symptoms so as ready for discharge   Short Term Goals: Ability to identify triggers associated with substance abuse/mental health issues will improve Ability to disclose and discuss suicidal ideas Ability to demonstrate self-control will improve Ability to identify and develop effective coping behaviors will improve     Medication Management: Evaluate patient's response, side effects, and tolerance of medication regimen.  Therapeutic Interventions: 1 to 1 sessions, Unit Group sessions and Medication administration.  Evaluation of Outcomes: Not Met   RN Treatment Plan for Primary Diagnosis: Bipolar affective disorder, depressed, severe, with psychotic behavior ([place]) Long Term Goal(s): Knowledge of disease and therapeutic regimen to maintain health will improve  Short Term Goals: Ability to remain free from injury will improve, Ability to participate in decision making will improve, Ability to verbalize feelings will improve, Ability to disclose and discuss suicidal ideas, and Ability to identify and develop effective coping behaviors will improve  Medication Management: RN will administer medications as ordered by provider, will assess and evaluate patient's response and provide education to patient for  prescribed medication. RN will report any adverse and/or side effects to prescribing provider.  Therapeutic Interventions: 1 on 1 counseling sessions, Psychoeducation, Medication administration, Evaluate responses to treatment, Monitor vital signs and CBGs as ordered, Perform/monitor CIWA, COWS, AIMS and Fall Risk screenings as ordered, Perform wound care treatments as ordered.  Evaluation of Outcomes: Not Met   LCSW Treatment Plan for Primary Diagnosis: Bipolar affective disorder, depressed, severe, with psychotic behavior ([place]) Long Term Goal(s): Safe transition to appropriate next level of care at discharge, Engage patient in therapeutic group addressing interpersonal concerns.  Short Term Goals: Engage patient in aftercare planning with referrals and resources, Increase social support, Increase emotional regulation, Facilitate acceptance of mental health diagnosis and concerns, Identify triggers associated with mental health/substance abuse issues, and Increase skills for wellness and recovery  Therapeutic Interventions: Assess for all discharge needs, 1 to 1 time with Education officer, museum, Explore  available resources and support systems, Assess for adequacy in community support network, Educate family and significant other(s) on suicide prevention, Complete Psychosocial Assessment, Interpersonal group therapy.  Evaluation of Outcomes: Not Met   Progress in Treatment: Attending groups: Yes. Participating in groups: Yes. Taking medication as prescribed: Yes. Toleration medication: Yes. Family/Significant other contact made: No, will contact:  [place]  Patient understands diagnosis: Yes. Discussing patient identified problems/goals with staff: Yes. Medical problems stabilized or resolved: Yes. Denies suicidal/homicidal ideation: Yes. Issues/concerns per patient self-inventory: No.   New problem(s) identified: No, Describe:  None   New Short Term/Long Term Goal(s): medication  stabilization, elimination of SI thoughts, development of comprehensive mental wellness plan.   Patient Goals: "Better life and health management skills"  Discharge Plan or Barriers: Patient recently admitted. CSW will continue to follow and assess for appropriate referrals and possible discharge planning.   Reason for Continuation of Hospitalization: Anxiety Depression Medication stabilization Suicidal ideation  Estimated Length of Stay: 3 to 7 days   Last [place] Suicide Severity Risk Score: [place] Admission (Current) from 02/11/2022 in [facility] [facility]B Most recent reading at 02/11/2022 11:29 PM ED from 02/11/2022 in [facility] Most recent reading at 02/11/2022  5:08 AM ED from 02/11/2022 in [place] Most recent reading at 02/11/2022  2:01 AM  C-SSRS RISK CATEGORY High Risk High Risk No Risk       Last PHQ 2/9 Scores:    02/11/2022    5:08 AM 01/09/2022    8:53 AM 01/08/2022   11:57 AM  Depression screen PHQ 2/9  Decreased Interest '2 1 1  ' Down, Depressed, Hopeless '3 1 2  ' PHQ - 2 Score '5 2 3  ' Altered sleeping '2 1 1  ' Tired, decreased energy '2 1 1  ' Change in appetite '2 1 2  ' Feeling bad or failure about yourself  '2 1 3  ' Trouble concentrating '2 1 2  ' Moving slowly or fidgety/restless '1 1 3  ' Suicidal thoughts '1 1 2  ' PHQ-9 Score '17 9 17  ' Difficult doing work/chores Extremely dIfficult  Very difficult    Scribe for Treatment Team: Darleen Crocker, Latanya Presser 02/13/2022 2:25 PM

## 2022-02-13 NOTE — [facility] Group Notes (Signed)
The focus of this group is to help patients establish daily goals to achieve during treatment and discuss how the patient can incorporate goal setting into their daily lives to aide in recovery.  Pt did not attend group 

## 2022-02-13 NOTE — [facility] Group Notes (Signed)
PT attended NA group and was appropriate and attentive. 

## 2022-02-13 NOTE — BHH Group Notes (Signed)
Adult Psychoeducational Group Note  Date:  02/13/2022 Time:  11:26 AM  Group Topic/Focus:  Healthy Communication:   The focus of this group is to discuss communication, barriers to communication, as well as healthy ways to communicate with others.  Participation Level:  Did Not Attend    Donell Beers 02/13/2022, 11:26 AM

## 2022-02-13 NOTE — Progress Notes (Addendum)
Patient overall cooperative and partially med compliant. Patient took all his medications except for his 12:00 gabapentin. Patient did take his 1700 Gabapentin. Patient stated he wants to take as less medications as possible and does not want to take anything that he does not need. Patient denies SI, HI, and A/V/H with no plan/intent. Patient stated he had a hard time staying asleep last night and felt hot/sweaty. Patient denied any pain. Patient's latest CIWA score 1. No s/s of current distress.   1854: Patient received atarax po prn due to anxiety per patient request.

## 2022-02-13 NOTE — Progress Notes (Addendum)
[facility] MD Progress Note  02/13/2022 4:38 PM Tristan [name]  MRN:  [id number]  Chief Complaint: SI  Reason for Admission:  Tristan [name] is a 36 y.o. male with a history of polysubstance abuse, Bipolar I, depression, PTSD and previous diagnosis of substance induced mood d/o, who was initially admitted for inpatient psychiatric hospitalization on 02/11/2022 for management of worsening depression, command AH, SI with plans to jump off a bridge, and recent drug relapse. The patient is currently on Hospital Day 2.   Chart Review from last 24 hours:  The patient's chart was reviewed and nursing notes were reviewed. The patient's case was discussed in multidisciplinary team meeting. Per nurses he did not attend groups. He had no acute behavioral issues or safety concerns noted but was irritable on the unit yesterday. Per [facility] he was compliant with scheduled medications except refusal of  2 doses of Neurontin yesterday. He received Vistaril PRN X1 yesterday for anxiety.  Information Obtained Today During Patient Interview: The patient was seen and evaluated on the unit. On assessment today the patient reports that he has not wanted to attend groups or get out of bed because he is feeling tired.  He states he did not sleep well overnight and was confronted with the fact that he was in bed most of the day yesterday.  Good sleep hygiene was discussed.  When questioned again about substance use prior to admission, he is very evasive and will not answer.  His positive urine drug screen results were discussed in detail, and he states that he is not sure how his drug screen is positive for so many things.  He thinks that he may have used some opiates prior to admission but will not give further explanation.  He states he is not currently having any signs of withdrawal.  He is still interested in potential for residential rehab but has been told by social work that his options are limited given his  insurance status and his hepatitis C status.  We discussed the option of an [facility] or [facility] [place], but he is not sure that he can financially for upfront costs associated with 1 of these programs.  He states his appetite is fair.  He reports having passive SI without intent or plan this morning which quickly resolved, and he is able to contract for safety on the unit.  He denies HI, paranoia, AVH, ideas of reference, or first rank symptoms.  He specifically states he has not had any command voices in 1 to 2 days.  When questioned about his mood he is ambivalent and avoidant to answer.  He voices no current physical complaints.  Principal Problem: Bipolar affective disorder, depressed, severe, with psychotic behavior ([place]) Diagnosis: Principal Problem:   Bipolar affective disorder, depressed, severe, with psychotic behavior ([place]) Active Problems:   Polysubstance abuse ([place])  Total Time Spent in Direct Patient Care:  I personally spent 30 minutes on the unit in direct patient care. The direct patient care time included face-to-face time with the patient, reviewing the patient's chart, communicating with other professionals, and coordinating care. Greater than 50% of this time was spent in counseling or coordinating care with the patient regarding goals of hospitalization, psycho-education, and discharge planning needs.   Past Psychiatric History: see H&P  Past Medical History:  Past Medical History:  Diagnosis Date   Bipolar 1 disorder ([place])    Hepatitis C    Hypertension    PTSD (post-traumatic stress disorder)  Past Surgical History:  Procedure Laterality Date   NO PAST SURGERIES     Family History:  Family History  Problem Relation Age of Onset   Other Father    Psychiatric Illness Father    Family Psychiatric  History: see H&P  Social History:  Social History   Substance and Sexual Activity  Alcohol Use Yes   Comment: Pint per day     Social History    Substance and Sexual Activity  Drug Use Yes   Types: Marijuana, Heroin, Cocaine, Methamphetamines, Morphine   Comment: denies drug use    Social History   Socioeconomic History   Marital status: Single    Spouse name: Not on file   Number of children: Not on file   Years of education: Not on file   Highest education level: Not on file  Occupational History   Not on file  Tobacco Use   Smoking status: Every Day    Packs/day: 0.50    Types: Cigarettes   Smokeless tobacco: Never  Vaping Use   Vaping Use: Never used  Substance and Sexual Activity   Alcohol use: Yes    Comment: Pint per day   Drug use: Yes    Types: Marijuana, Heroin, Cocaine, Methamphetamines, Morphine    Comment: denies drug use   Sexual activity: Not Currently  Other Topics Concern   Not on file  Social History Narrative   ** Merged History Encounter **       Social Determinants of Health   Financial Resource Strain: Not on file  Food Insecurity: Not on file  Transportation Needs: Not on file  Physical Activity: Not on file  Stress: Not on file  Social Connections: Not on file   Sleep: Poor per patient self-report  Appetite:  Fair  Current Medications: Current Facility-Administered Medications  Medication Dose Route Frequency Provider Last Rate Last Admin   acetaminophen (TYLENOL) tablet 650 mg  650 mg Oral Q6H PRN Revonda Humphrey, NP       alum & mag hydroxide-simeth (MAALOX/MYLANTA) 200-200-20 MG/5ML suspension 30 mL  30 mL Oral Q4H PRN Revonda Humphrey, NP       dicyclomine (BENTYL) tablet 20 mg  20 mg Oral Q6H PRN Nelda Marseille, Malaiah Viramontes E, MD       escitalopram (LEXAPRO) tablet 10 mg  10 mg Oral Daily Thomes Lolling H, NP   10 mg at 02/13/22 0943   gabapentin (NEURONTIN) capsule 100 mg  100 mg Oral TID Revonda Humphrey, NP   100 mg at 02/13/22 [id number]   hydrOXYzine (ATARAX) tablet 25 mg  25 mg Oral Q6H PRN Harlow Asa, MD   25 mg at 02/12/22 1925   loperamide (IMODIUM) capsule 2-4 mg   2-4 mg Oral PRN Harlow Asa, MD       LORazepam (ATIVAN) tablet 1 mg  1 mg Oral Q6H PRN Harlow Asa, MD       OLANZapine zydis (ZYPREXA) disintegrating tablet 5 mg  5 mg Oral Q8H PRN Harlow Asa, MD       And   LORazepam (ATIVAN) tablet 1 mg  1 mg Oral PRN Harlow Asa, MD       And   ziprasidone (GEODON) injection 20 mg  20 mg Intramuscular PRN Nelda Marseille, Zaylynn Rickett E, MD       magnesium hydroxide (MILK OF MAGNESIA) suspension 30 mL  30 mL Oral Daily PRN Revonda Humphrey, NP       methocarbamol (  ROBAXIN) tablet 500 mg  500 mg Oral Q8H PRN Harlow Asa, MD       multivitamin with minerals tablet 1 tablet  1 tablet Oral Daily Revonda Humphrey, NP   1 tablet at 02/13/22 0943   naproxen (NAPROSYN) tablet 500 mg  500 mg Oral BID PRN Harlow Asa, MD       ondansetron (ZOFRAN-ODT) disintegrating tablet 4 mg  4 mg Oral Q6H PRN Harlow Asa, MD       pantoprazole (PROTONIX) EC tablet 40 mg  40 mg Oral Daily Revonda Humphrey, NP   40 mg at 02/13/22 [id number]   QUEtiapine (SEROQUEL) tablet 100 mg  100 mg Oral QHS Revonda Humphrey, NP   100 mg at 02/12/22 2043   thiamine tablet 100 mg  100 mg Oral Daily Viann Fish E, MD   100 mg at 02/13/22 [id number]   traZODone (DESYREL) tablet 50 mg  50 mg Oral QHS PRN Revonda Humphrey, NP        Lab Results:  Results for orders placed or performed during the hospital encounter of 02/11/22 (from the past 48 hour(s))  CK     Status: None   Collection Time: 02/13/22  6:13 AM  Result Value Ref Range   Total CK 197 49 - 397 U/L    Comment: Performed at [facility], [place] [address] [place], [place] [address]    Blood Alcohol level:  Lab Results  Component Value Date   ETH <10 02/05/2022   ETH <10 [id number]    Metabolic Disorder Labs: Lab Results  Component Value Date   HGBA1C 5.1 11/21/2021   MPG 99.67 11/21/2021   MPG 96.8 02/22/2019   Lab Results  Component Value Date   PROLACTIN 4.5 11/21/2021   Lab  Results  Component Value Date   CHOL 154 11/21/2021   TRIG 68 11/21/2021   HDL 74 11/21/2021   CHOLHDL 2.1 11/21/2021   VLDL 14 11/21/2021   LDLCALC 66 11/21/2021   LDLCALC 66 02/22/2019    Physical Findings: CIWA:  CIWA-Ar Total: 1 COWS:  COWS Total Score: 2  Musculoskeletal: Strength & Muscle Tone: within normal limits Gait & Station: normal Patient leans: N/A  Psychiatric Specialty Exam:  Presentation  General Appearance: unkempt appearing, casually dressed  Eye Contact:Minimal  Speech:Clear and Coherent; Normal Rate  Speech Volume:Normal  Mood and Affect  Mood:irritable, avoidant  Affect:irritable   Thought Process  Thought Processes:vague, concrete  Descriptions of Associations:Intact  Orientation:uncooperative for questioning  Thought Content:Reports passive SI this morning which resolved and contracts for safety on the unit; denies HI; denies AVH, paranoia, ideas of reference or first rank symptoms , denies HI  Hallucinations:Denied  Ideas of Reference:Denied  Suicidal Thoughts: passive SI today that resolved quickly - can contract for safety on the unit  Homicidal Thoughts:Denied  Sensorium  Memory: [place]  Insight:Fair   Executive Functions  Concentration:Fair  Attention Span:Fair  [place]  Language:Good   Psychomotor Activity  Psychomotor Activity:Normal  Assets  Assets:Communication Skills; Desire for Improvement; Financial Resources/Insurance; Leisure Time; Physical Health; Resilience   Sleep  8.25 hours  Physical Exam Vitals and nursing note reviewed.  Constitutional:      Appearance: Normal appearance.  HENT:     Head: Normocephalic.  Pulmonary:     Effort: Pulmonary effort is normal.  Musculoskeletal:     Comments: No LE edema noted, no pitting edema noted to BLE  Neurological:     General: No focal deficit present.     Mental Status: He is alert.   Review of  Systems  Respiratory:  Negative for shortness of breath.   Cardiovascular:  Negative for chest pain.  Gastrointestinal:  Negative for diarrhea, nausea and vomiting.  Neurological:  Negative for headaches.  Blood pressure (!) 126/91, pulse 82, temperature 98.6 F (37 C), temperature source Oral, resp. rate 16, height 5\' 10"  (1.778 m), weight 85 kg, SpO2 99 %. Body mass index is 26.89 kg/m.   Treatment Plan Summary: Bipolar I MRE depressed severe with psychotic features (r/o SIMD vs SIPD) Opiate use d/o by hx Cannabis use d/o by hx Stimulant use d/o by hx R/o alcohol use d/o Hep C   PLAN: Safety and Monitoring:             -- Voluntary admission to inpatient psychiatric unit for safety, stabilization and treatment             -- Daily contact with patient to assess and evaluate symptoms and progress in treatment             -- Patient's case to be discussed in multi-disciplinary team meeting             -- Observation Level : q15 minute checks             -- Vital signs:  q12 hours             -- Precautions: suicide, elopement, and assault   2. Psychiatric Diagnoses and Treatment:              Bipolar I MRE depressed severe with psychotic features (r/o SIMD vs SIPD) -- Continue Lexapro 10mg  daily -- Continue Neurontin 100mg  tid -- Continue Seroquel 100mg  qhs  (lipid panel within normal limits in March 2023, hemoglobin A1c 5.1 in March 2023, QTc 439 ms)             -- Encouraged patient to participate in unit milieu and in scheduled group therapies                           Opiate use d/o by hx             Cannabis use d/o by hx             Stimulant use d/o by hx             R/o alcohol use d/o             -- UDS positive for marijuana, morphine, methamphetamine, cocaine, and amphetamines but patient vague and ambivalent about pattern or amount of recent use             -- Continue COWS and CIWA for monitoring with PRNS available for withdrawal (recent CIWA scores: 1,3,1; recent  COWS scores: 2,3,2,2)             -- On Protonix for GI protection and MVI and thiamine oral replacement             -- will see if patient qualifies medically for residential rehab referrals; discussed [facility] or [facility] vs outpatient SA treatment if residential rehab not available              3. Medical Issues Being Addressed:              Hepatitis C              --  AST trending down to 71 and ALT trending down to 62             -- Will need ID f/u after discharge for treatment               LE Edema by self-report             -- No edema noted on exam today             -- Hold lasix (just started on 5/21 in ED and unclear if he ever filled med)             -- Per ED notes has had recent w/u including negative DVT studies, negative CXR and negative BNP and no evidence of cellulitis, no signs of trauma, and no signs of acute arterial occlusion             -- Repeat CK normalized to 197 today               Admission labs reviewed: Respiratory panel negative, urine drug screen positive for amphetamines, cocaine, methamphetamine, morphine, and marijuana; WBC 11.0, hemoglobin 12.1, hematocrit 36.2, platelets 275, CMP within normal limits other than an AST of 71 and an ALT of 62; CK on 02/05/2022 was 506; Tylenol level less than 10 on 0000000, Salicylate less than 7 on 02/05/2022, ETOH <10 on 02/05/2022; EKG on 02/10/2022 showed normal sinus rhythm at 79 bpm with QTc of 439 ms   4. Discharge Planning:              -- Social work and case management to assist with discharge planning and identification of hospital follow-up needs prior to discharge             -- Estimated LOS: 5-7 days             -- Discharge Concerns: Need to establish a safety plan; Medication compliance and effectiveness             -- Discharge Goals: Return home with outpatient referrals for mental health follow-up including medication management/psychotherapy     I certify that inpatient services furnished can reasonably  be expected to improve the patient's condition.      Harlow Asa, MD, FAPA 02/13/2022, 4:38 PM

## 2022-02-13 NOTE — Plan of Care (Signed)
  Problem: Coping: Goal: Ability to verbalize frustrations and anger appropriately will improve Outcome: Progressing Goal: Ability to demonstrate self-control will improve Outcome: Progressing   Problem: Safety: Goal: Periods of time without injury will increase Outcome: Progressing   

## 2022-02-13 NOTE — Group Note (Signed)
Recreation Therapy Group Note   Group Topic:Team Building  Group Date: 02/13/2022 Start Time: 0930 End Time: 1000 Facilitators: Caroll Rancher, LRT,CTRS Location: [address] Dayroom   Goal Area(s) Addresses:  Patient will effectively work with peer towards shared goal.  Patient will identify skills used to make activity successful.  Patient will identify how skills used during activity can be applied to reach post d/c goals.    Group Description: [employer]. In teams of 5-6, patients were given 25 small craft pipe cleaners. Using the materials provided, patients were instructed to compete again the opposing team(s) to build the tallest free-standing structure from floor level. The activity was timed; difficulty increased by Clinical research associate as Production designer, theatre/television/film continued.  Systematically resources were removed with additional directions for example, placing one arm behind their back, working in silence, and shape stipulations. LRT facilitated post-activity discussion reviewing team processes and necessary communication skills involved in completion. Patients were encouraged to reflect how the skills utilized, or not utilized, in this activity can be incorporated to positively impact support systems post discharge.   Affect/Mood: N/A   Participation Level: Did not attend    Clinical Observations/Individualized Feedback:     Plan: Continue to engage patient in RT group sessions 2-3x/week.   Caroll Rancher, LRT,CTRS 02/13/2022 11:52 AM

## 2022-02-13 NOTE — BHH Counselor (Addendum)
CSW called [facility] and [facility] for an insurance verification to see if patient can go to rehab facility.At both locations they took patient information and agreed to call back to verify insurance. CSW waiting on call back.   Addendum: both recovery centers called this social worker back and said they can not verify his insurance.  CSW contacted [facility] to make sure information was correct in our system.  They said that it was.  CSW unsure    [employer], LCSW, LCAS Clincal Social Worker  [facility]

## 2022-02-14 ENCOUNTER — Encounter (HOSPITAL_COMMUNITY): Payer: Self-pay

## 2022-02-14 ENCOUNTER — Emergency Department (HOSPITAL_COMMUNITY)
Admission: EM | Admit: 2022-02-14 | Discharge: 2022-02-14 | Discharge status: Against Medical Advice | Payer: PRIVATE HEALTH INSURANCE | Attending: Emergency Medicine | Admitting: Emergency Medicine

## 2022-02-14 ENCOUNTER — Other Ambulatory Visit: Payer: Self-pay

## 2022-02-14 ENCOUNTER — Emergency Department (HOSPITAL_COMMUNITY): Payer: PRIVATE HEALTH INSURANCE

## 2022-02-14 DIAGNOSIS — T40601A Poisoning by unspecified narcotics, accidental (unintentional), initial encounter: Secondary | ICD-10-CM | POA: Insufficient documentation

## 2022-02-14 DIAGNOSIS — R000 Tachycardia, unspecified: Secondary | ICD-10-CM | POA: Diagnosis not present

## 2022-02-14 DIAGNOSIS — F315 Bipolar disorder, current episode depressed, severe, with psychotic features: Principal | ICD-10-CM

## 2022-02-14 DIAGNOSIS — I10 Essential (primary) hypertension: Secondary | ICD-10-CM | POA: Diagnosis not present

## 2022-02-14 LAB — SALICYLATE LEVEL: Salicylate Lvl: <7 mg/dL — ABNORMAL LOW (ref 7.0–30.0)

## 2022-02-14 LAB — ACETAMINOPHEN LEVEL: Acetaminophen (Tylenol), Serum: <10 ug/mL — ABNORMAL LOW (ref 10–30)

## 2022-02-14 LAB — COMPREHENSIVE METABOLIC PANEL
ALT: 40 U/L (ref 0–44)
AST: 32 U/L (ref 15–41)
Albumin: 4.4 g/dL (ref 3.5–5.0)
Alkaline Phosphatase: 73 U/L (ref 38–126)
Anion gap: 7 (ref 5–15)
BUN: 17 mg/dL (ref 6–20)
CO2: 25 mmol/L (ref 22–32)
Calcium: 9.3 mg/dL (ref 8.9–10.3)
Chloride: 106 mmol/L (ref 98–111)
Creatinine, Ser: 1.41 mg/dL — ABNORMAL HIGH (ref 0.61–1.24)
GFR, Estimated: >60 mL/min (ref >60)
Glucose, Bld: 108 mg/dL — ABNORMAL HIGH (ref 70–99)
Potassium: 5.6 mmol/L — ABNORMAL HIGH (ref 3.5–5.1)
Sodium: 138 mmol/L (ref 135–145)
Total Bilirubin: 0.5 mg/dL (ref 0.3–1.2)
Total Protein: 8.5 g/dL — ABNORMAL HIGH (ref 6.5–8.1)

## 2022-02-14 LAB — CBC WITH DIFFERENTIAL/PLATELET
Abs Immature Granulocytes: 0.23 10*3/uL — ABNORMAL HIGH (ref 0.00–0.07)
Basophils Absolute: 0.1 10*3/uL (ref 0.0–0.1)
Basophils Relative: 1 %
Eosinophils Absolute: 0.1 10*3/uL (ref 0.0–0.5)
Eosinophils Relative: 1 %
HCT: 39.9 % (ref 39.0–52.0)
Hemoglobin: 13.6 g/dL (ref 13.0–17.0)
Immature Granulocytes: 2 %
Lymphocytes Relative: 19 %
Lymphs Abs: 3 10*3/uL (ref 0.7–4.0)
MCH: 26.5 pg (ref 26.0–34.0)
MCHC: 34.1 g/dL (ref 30.0–36.0)
MCV: 77.8 fL — ABNORMAL LOW (ref 80.0–100.0)
Monocytes Absolute: 1.2 10*3/uL — ABNORMAL HIGH (ref 0.1–1.0)
Monocytes Relative: 8 %
Neutro Abs: 10.8 10*3/uL — ABNORMAL HIGH (ref 1.7–7.7)
Neutrophils Relative %: 69 %
Platelets: 462 10*3/uL — ABNORMAL HIGH (ref 150–400)
RBC: 5.13 MIL/uL (ref 4.22–5.81)
RDW: 16 % — ABNORMAL HIGH (ref 11.5–15.5)
WBC: 15.4 10*3/uL — ABNORMAL HIGH (ref 4.0–10.5)
nRBC: 0 % (ref 0.0–0.2)

## 2022-02-14 LAB — ETHANOL: Alcohol, Ethyl (B): <10 mg/dL (ref <10)

## 2022-02-14 LAB — CBG MONITORING, ED: Glucose-Capillary: 108 mg/dL — ABNORMAL HIGH (ref 70–99)

## 2022-02-14 MED ORDER — ESCITALOPRAM OXALATE 10 MG PO TABS: 10.0000 mg | ORAL_TABLET | Freq: Every day | ORAL | 0 refills | Status: DC

## 2022-02-14 MED ORDER — SODIUM CHLORIDE 0.9 % IV SOLN: INTRAVENOUS | Status: DC

## 2022-02-14 MED ORDER — NALOXONE HCL 2 MG/2ML IJ SOSY
PREFILLED_SYRINGE | INTRAMUSCULAR | Status: AC
  Administered 2022-02-14: 2 mg via INTRAVENOUS
  Filled 2022-02-14: qty 2

## 2022-02-14 MED ORDER — QUETIAPINE FUMARATE 100 MG PO TABS: 100.0000 mg | ORAL_TABLET | Freq: Every day | ORAL | 0 refills | Status: DC

## 2022-02-14 MED ORDER — NALOXONE HCL 2 MG/2ML IJ SOSY
2.0000 mg | PREFILLED_SYRINGE | Freq: Once | INTRAMUSCULAR | Status: AC
Start: 2022-02-14 — End: 2022-02-14

## 2022-02-14 MED ORDER — SODIUM CHLORIDE 0.9 % IV BOLUS
1000.0000 mL | Freq: Once | INTRAVENOUS | Status: AC
  Administered 2022-02-14: 1000 mL via INTRAVENOUS

## 2022-02-14 MED ORDER — GABAPENTIN 100 MG PO CAPS: 100.0000 mg | ORAL_CAPSULE | Freq: Three times a day (TID) | ORAL | 0 refills | Status: DC

## 2022-02-14 MED ORDER — NICOTINE POLACRILEX 2 MG MT GUM: 2.0000 mg | CHEWING_GUM | OROMUCOSAL | 0 refills | Status: AC | PRN

## 2022-02-14 NOTE — ED Notes (Signed)
Urinal placed at bedside, pt informed of need for sample.

## 2022-02-14 NOTE — ED Notes (Signed)
Following narcan administration, pt became fully awake. Pt then proceeded to remove all vital sign monitoring devices, attempted to remove IV and stated "I'm good to go, thank you all, I can't believe I overdosed again!" Pt then proceeded to get out of bed and insisted that he was "alright now, I'm ready to go. Pull my IV out please." EDP is aware of pt leaving AMA. Pt was advised to strongly remain and continue treatment and monitoring. Pt was informed of risks of leaving AMA. Pt ackowleged. Pt walked out of room, stumbling but insisted he was alright and walked out of emergency department.

## 2022-02-14 NOTE — Progress Notes (Signed)
Tristan [name]  D/C'd Home per MD order.  Discussed with the patient and all questions fully answered. Skin clean, dry and intact without evidence of skin break down, no evidence of skin tears noted.   An After Visit Summary was printed and given to the patient. Patient received prescription.  D/c education completed with patient including follow up instructions, medication list, d/c activities limitations if indicated, with other d/c instructions as indicated by MD - patient able to verbalize understanding, all questions fully answered.   Patient instructed to return to ED, call 911, or call MD for any changes in condition.   Patient escorted to the main entrance, and D/C home via public transport and voucher given to him.[name] 02/14/2022 1:07 PM

## 2022-02-14 NOTE — ED Notes (Signed)
Pt currently awake and talking after verbal stimuli.

## 2022-02-14 NOTE — ED Triage Notes (Signed)
Pt BIB EMS from [facility]. Pt was in facility bathroom, per facility staff pt was unresponsive. EMS states pt was lethargic but verbal with them. EMS suspects heroin. Pt refused narcan with EMS. EMS states pt was put on 2L O2 for comfort. HR 126
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
## 2022-02-14 NOTE — BHH Counselor (Signed)
CSW called [facility] again to assist in verifying insurance.  They could still not verify patient insurance but agreed to bring case up to management to see if they had a way to verify insurance.  CSW also called [facility] to see if he can qualify for services.  Marcelino Duster agreed to look into his insurance to see if she can verify it and if she cannot he can possibly go there.  Marcelino Duster asked if patient was on medication for Hep C and CSW let her know that doctors said that it wasn't acute at this time.  She asked to send referral and she would call back about insurance eligibility.    Maven Rosander, LCSW, LCAS Clincal Social Worker  [facility]

## 2022-02-14 NOTE — ED Provider Notes (Signed)
[facility] DEPT Provider Note   CSN: [id number] Arrival date & time: 02/14/22  1947     History  Chief Complaint  Patient presents with   Drug Problem    Tristan [name] is a 36 y.o. male.  Pt is a 36 yo male who has a hx of polysubstance abuse, htn, ptsd, bipolar 1 d/o, and hep C.  Pt was d/c from [facility] today for psychosis and depression.  Pt was found unresponsive today at a bathroom in [place].  Pt was given Narcan (unkn amt) by staff at [facility].  Pt became more responsive.  EMS was called and he refused narcan with them.  Pt initially on a NRB by EMS, but is transitioned to 2L.  Pt is not giving much hx, but will answer simple questions.      Home Medications Prior to Admission medications   Medication Sig Start Date End Date Taking? Authorizing Provider  escitalopram (LEXAPRO) 10 MG tablet Take 1 tablet (10 mg total) by mouth daily. 02/14/22 03/16/22  Harlow Asa, MD  gabapentin (NEURONTIN) 100 MG capsule Take 1 capsule (100 mg total) by mouth 3 (three) times daily. 02/14/22 03/16/22  Harlow Asa, MD  loratadine (CLARITIN) 10 MG tablet Take 10 mg by mouth daily as needed for allergies.    [provider]  nicotine polacrilex (NICORETTE) 2 MG gum Take 1 each (2 mg total) by mouth as needed for smoking cessation. 02/14/22 03/16/22  Harlow Asa, MD  pantoprazole (PROTONIX) 40 MG tablet Take 1 tablet (40 mg total) by mouth daily. 01/23/22   Armando Reichert, MD  QUEtiapine (SEROQUEL) 100 MG tablet Take 1 tablet (100 mg total) by mouth at bedtime. 02/14/22 03/16/22  Harlow Asa, MD      Allergies    Tomato    Review of Systems   Review of Systems  All other systems reviewed and are negative.  Physical Exam Updated Vital Signs BP (!) 137/103   Pulse (!) 109   Temp 97.7 F (36.5 C) (Oral)   Resp (!) 7   SpO2 99%  Physical Exam Vitals and nursing note reviewed.  Constitutional:      Appearance: Normal  appearance.  HENT:     Head: Normocephalic and atraumatic.     Right Ear: External ear normal.     Left Ear: External ear normal.     Nose: Nose normal.     Mouth/Throat:     Mouth: Mucous membranes are dry.  Eyes:     Extraocular Movements: Extraocular movements intact.     Conjunctiva/sclera: Conjunctivae normal.     Pupils: Pupils are equal, round, and reactive to light.  Cardiovascular:     Rate and Rhythm: Regular rhythm. Tachycardia present.     Pulses: Normal pulses.     Heart sounds: Normal heart sounds.  Pulmonary:     Effort: Pulmonary effort is normal.     Breath sounds: Normal breath sounds.  Abdominal:     General: Abdomen is flat. Bowel sounds are normal.     Palpations: Abdomen is soft.  Musculoskeletal:        General: Normal range of motion.     Cervical back: Normal range of motion and neck supple.  Skin:    General: Skin is warm.     Capillary Refill: Capillary refill takes less than 2 seconds.     Comments: Track marks on arms  Neurological:     Mental Status: He is  alert.     Comments: Pt will only tell me his name.  He will not answer any other questions about orientation.  Psychiatric:        Speech: He is noncommunicative.    ED Results / Procedures / Treatments   Labs (all labs ordered are listed, but only abnormal results are displayed) Labs Reviewed  COMPREHENSIVE METABOLIC PANEL - Abnormal; Notable for the following components:      Result Value   Potassium 5.6 (*)    Glucose, Bld 108 (*)    Creatinine, Ser 1.41 (*)    Total Protein 8.5 (*)    All other components within normal limits  SALICYLATE LEVEL - Abnormal; Notable for the following components:   Salicylate Lvl [id number] (*)    All other components within normal limits  ACETAMINOPHEN LEVEL - Abnormal; Notable for the following components:   Acetaminophen (Tylenol), Serum <10 (*)    All other components within normal limits  CBC WITH DIFFERENTIAL/PLATELET - Abnormal; Notable for the  following components:   WBC 15.4 (*)    MCV 77.8 (*)    RDW 16.0 (*)    Platelets 462 (*)    Neutro Abs 10.8 (*)    Monocytes Absolute 1.2 (*)    Abs Immature Granulocytes 0.23 (*)    All other components within normal limits  CBG MONITORING, ED - Abnormal; Notable for the following components:   Glucose-Capillary 108 (*)    All other components within normal limits  ETHANOL  RAPID URINE DRUG SCREEN, HOSP PERFORMED  URINALYSIS, ROUTINE W REFLEX MICROSCOPIC    EKG None  Radiology DG Chest Port 1 View  Result Date: 02/14/2022 CLINICAL DATA:  Overdose. EXAM: PORTABLE CHEST 1 VIEW COMPARISON:  Chest x-ray 02/10/2022. FINDINGS: The heart size and mediastinal contours are within normal limits. Both lungs are clear. The visualized skeletal structures are unremarkable. IMPRESSION: No active disease. Electronically Signed   By: Ronney Asters M.D.   On: 02/14/2022 20:47    Procedures Procedures    Medications Ordered in ED Medications  sodium chloride 0.9 % bolus 1,000 mL (0 mLs Intravenous Stopped 02/14/22 2043)    And  0.9 %  sodium chloride infusion (has no administration in time range)  naloxone [facility]) injection 2 mg (2 mg Intravenous Given 02/14/22 2039)    ED Course/ Medical Decision Making/ A&P                           Medical Decision Making Amount and/or Complexity of Data Reviewed Labs: ordered. Radiology: ordered.  Risk Prescription drug management.   This patient presents to the ED for concern of drug overdose, this involves an extensive number of treatment options, and is a complaint that carries with it a high risk of complications and morbidity.  The differential diagnosis includes aspiration, electrolyte abn, dehydration   Co morbidities that complicate the patient evaluation  olysubstance abuse, htn, ptsd, bipolar 1 d/o, and hep C   Additional history obtained:  Additional history obtained from epic chart review External records from outside source  obtained and reviewed including EMS report   Lab Tests:  I Ordered, and personally interpreted labs.  The pertinent results include:  cbc with wbc 15.4; cmp with slight bump in K at 5.6 and Cr 1.41; lfts are back to nl   Imaging Studies ordered:  I ordered imaging studies including CXR I independently visualized and interpreted imaging which showed  IMPRESSION:  No active disease.   I agree with the radiologist interpretation   Cardiac Monitoring:  The patient was maintained on a cardiac monitor.  I personally viewed and interpreted the cardiac monitored which showed an underlying rhythm of: nsr   Medicines ordered and prescription drug management:  I ordered medication including narcan  for resp depression  Reevaluation of the patient after these medicines showed that the patient improved I have reviewed the patients home medicines and have made adjustments as needed    Critical Interventions:  narcan   Problem List / ED Course:  Heroin OD:  pt's RR started to drift down again, so pt was given 2 mg narcan iv.  It worked quickly.  Shortly after the narcan was given, pt was wide awake.  He removed his IV and disconnected himself from the monitor.  He told the nurse that he wanted to leave.  He was encouraged to stay for further eval.  He refused to stay. Mild AKI:  pt given IVFs   Reevaluation:  After the interventions noted above, I reevaluated the patient and found that they have :improved   Social Determinants of Health:  Lives at home   Dispostion:  After consideration of the diagnostic results and the patients response to treatment, I feel that the patent would benefit from continued obs + IVFs.  He refused and left AMA.  Pt told to return at any time.  CRITICAL CARE Performed by: Isla Pence   Total critical care time: 30 minutes  Critical care time was exclusive of separately billable procedures and treating other patients.  Critical care  was necessary to treat or prevent imminent or life-threatening deterioration.  Critical care was time spent personally by me on the following activities: development of treatment plan with patient and/or surrogate as well as nursing, discussions with consultants, evaluation of patient's response to treatment, examination of patient, obtaining history from patient or surrogate, ordering and performing treatments and interventions, ordering and review of laboratory studies, ordering and review of radiographic studies, pulse oximetry and re-evaluation of patient's condition.         Final Clinical Impression(s) / ED Diagnoses Final diagnoses:  Opiate overdose, accidental or unintentional, initial encounter [facility])    Rx / [place] Orders ED Discharge Orders     None         Isla Pence, MD 02/14/22 2145

## 2022-02-14 NOTE — [facility] Suicide Risk Assessment (Signed)
[facility] INPATIENT:  Family/Significant Other Suicide Prevention Education  Suicide Prevention Education:  Patient Refusal for Family/Significant Other Suicide Prevention Education: The patient Tristan [name] has refused to provide written consent for family/significant other to be provided Family/Significant Other Suicide Prevention Education during admission and/or prior to discharge.  Physician notified.  Merline Perkin E Cyncere Ruhe 02/14/2022, 1:41 PM

## 2022-02-14 NOTE — Progress Notes (Signed)
  [facility] Adult Case Management Discharge Plan :  Will you be returning to the same living situation after discharge:  No. Patient plans to enroll in SLA program At discharge, do you have transportation home?: Yes,  bus tickets to get to [facility] to obtain ID and social security number Do you have the ability to pay for your medications: Yes,  insurance  Release of information consent forms completed and in the chart;  Patient's signature needed at discharge.  Patient to Follow up at:  Follow-up Information     [facility]. Go on 02/22/2022.   Why: You have a hospital follow up appointment to obtain therapy and medication management services and substance use services on 02/22/22 at 1:00 pm.  This appointment will be held in person. Contact information: [address] [place] [address] [phone]                 Next level of care provider has access to [facility] Link:no  Safety Planning and Suicide Prevention discussed: Yes,  with patient, patient declined consents     Has patient been referred to the Quitline?: Patient refused referral  Patient has been referred for addiction treatment: Yes, [facility] appt and going to [facility] [place]  Mckyla Deckman E [place], LCSW 02/14/2022, 12:05 PM

## 2022-02-14 NOTE — Progress Notes (Signed)

## 2022-02-14 NOTE — Discharge Summary (Signed)
Physician Discharge Summary Note  Patient:  Tristan [name] is an 36 y.o., male MRN:  [id number] DOB:  May 10, 1986 Patient phone:  [phone] (home)  Patient address:   [address] [place] [address],   Total Time Spent in Direct Patient Care:  I personally spent 36 minutes on the unit in direct patient care. The direct patient care time included face-to-face time with the patient, reviewing the patient's chart, communicating with other professionals, and coordinating care. Greater than 50% of this time was spent in counseling or coordinating care with the patient regarding goals of hospitalization, psycho-education, and discharge planning needs.  Date of Admission:  02/11/2022 Date of Discharge: 02/14/2022  Reason for Admission:  The patient is a 36 year old male with past psychiatric history of Bipolar I disorder, depression, PTSD, polysubstance abuse, and substance-induced mood disorder, who was admitted from [facility] voluntarily for management of worsening depression, command AH, SI with plans to jump off a bridge, and recent relapse with IV heroin. Per intake note, he injected 1g of IV heroin the day prior to coming to [facility]. See H&P for additional details.   Principal Problem: Bipolar affective disorder, depressed, severe, with psychotic behavior (HCC) Discharge Diagnoses: Principal Problem:   Bipolar affective disorder, depressed, severe, with psychotic behavior (HCC) Active Problems:   Polysubstance abuse (HCC)   Past Psychiatric History: see H&P  Past Medical History:  Past Medical History:  Diagnosis Date   Bipolar 1 disorder (HCC)    Hepatitis C    Hypertension    PTSD (post-traumatic stress disorder)     Past Surgical History:  Procedure Laterality Date   NO PAST SURGERIES     Family History:  Family History  Problem Relation Age of Onset   Other Father    Psychiatric Illness Father    Family Psychiatric  History: see H&P  Social History:  Social  History   Substance and Sexual Activity  Alcohol Use Yes   Comment: Pint per day     Social History   Substance and Sexual Activity  Drug Use Yes   Types: Marijuana, Heroin, Cocaine, Methamphetamines, Morphine   Comment: denies drug use    Social History   Socioeconomic History   Marital status: Single    Spouse name: Not on file   Number of children: Not on file   Years of education: Not on file   Highest education level: Not on file  Occupational History   Not on file  Tobacco Use   Smoking status: Every Day    Packs/day: 0.50    Types: Cigarettes   Smokeless tobacco: Never  Vaping Use   Vaping Use: Never used  Substance and Sexual Activity   Alcohol use: Yes    Comment: Pint per day   Drug use: Yes    Types: Marijuana, Heroin, Cocaine, Methamphetamines, Morphine    Comment: denies drug use   Sexual activity: Not Currently  Other Topics Concern   Not on file  Social History Narrative   ** Merged History Encounter **       Social Determinants of Health   Financial Resource Strain: Not on file  Food Insecurity: Not on file  Transportation Needs: Not on file  Physical Activity: Not on file  Stress: Not on file  Social Connections: Not on file   Hospital Course:  The patient was admitted voluntarily to [facility] where he was observed on q15 minute checks and was seen daily by attending psychiatrist. His care was discussed daily in  multi-disciplinary team meeting. On admission, he was restarted on Lexapro 10mg  daily for depressive symptoms,  Seroquel 100mg  qhs for mood stabilization and psychosis, and Neurontin 100mg  tid for anxiety and potential withdrawal. He had PRN Vistaril available for anxiety and PRN Trazodone available for sleep. He was observed with COWS and CIWA protocols for withdrawal and had PRN medications available for any withdrawal symptoms. He had no signs of complicated withdrawal during admission.   Prior to discharge he was visible in the milieu,  reported resolution of any psychotic symptoms, reported improvement in depressive symptoms and denied SI or HI. He had been accepted to of and requested discharge to attend that program with plans to go first to the [facility] for help with getting his ID and SS card.   During admission, he was noted to have improvement in previously elevated LFTs and was encouraged to reschedule with ID for Hepatitis C treatment without fail after discharge. An ambulatory referral to ID was placed at time of discharge. He was advised he will need recheck by a primary care provider without fail of his liver enzymes and white blood cell count given recent elevation. He will need ongoing monitoring of his lipids, weight, glucose, EKG, CBC and AIMS while on an atypical antipsychotic. He was encouraged to abstain from drugs and alcohol.   On day of discharge, He denied cravings/withdrawal symptoms and denied SI or HI. He denied AVH, paranoia, ideas of reference or first rank symptoms. He voiced no physical complaints and denied medication side-effects. He reported good sleep and appetite.He was deemed stable for discharge and transition to [facility] [place] [facility].  Physical Findings: CIWA:  CIWA-Ar Total: 0 COWS:  COWS Total Score: 0  Musculoskeletal: Strength & Muscle Tone: within normal limits Gait & Station: normal Patient leans: N/A   Psychiatric Specialty Exam   Presentation  General Appearance: Appropriate for Environment; Casual   Eye Contact:Good   Speech:Clear and Coherent; Normal Rate   Speech Volume:Normal   Mood and Affect  Mood: described as improved - appears euthymic   Affect:brighter appearing, excited about SLA potential    Thought Process  Thought Processes:Linear, goal directed   Descriptions of Associations:Intact   Orientation:Full (Time, Place and Person)   Thought Content:Denies SI, HI, AVH, paranoia or delusions   Hallucinations:Denied   Ideas of  Reference:None   Suicidal Thoughts:Denied   Homicidal Thoughts:Denied   Sensorium  Memory:Immediate Good; Recent Good; Remote Good   Judgment:Fair   Insight:Improved     Executive Functions  Concentration:Good   Attention Span:Good   Recall:Good   Fund of Knowledge:Good   Language:Good     Psychomotor Activity  Psychomotor Activity:Normal, no tremor or [facility]  Assets:Communication Skills; Desire for Improvement; Financial Resources/Insurance; Leisure Time; Physical Health; Resilience     Physical Exam: Physical Exam Vitals and nursing note reviewed.  Constitutional:      Appearance: Normal appearance.  HENT:     Head: Normocephalic.  Pulmonary:     Effort: Pulmonary effort is normal.  Neurological:     General: No focal deficit present.     Mental Status: He is alert.    Review of Systems  Respiratory:  Negative for shortness of breath.   Cardiovascular:  Negative for chest pain.  Gastrointestinal:  Negative for constipation, diarrhea, nausea and vomiting.  Neurological:  Negative for headaches.  Blood pressure (!) 126/91, pulse 82, temperature 98.6 F (37 C), temperature source Oral, resp. rate 16, height  5\' 10"  (1.778 m), weight 85 kg, SpO2 99 %. Body mass index is 26.89 kg/m.   Social History   Tobacco Use  Smoking Status Every Day   Packs/day: 0.50   Types: Cigarettes  Smokeless Tobacco Never   Tobacco Cessation:  A prescription for an FDA-approved tobacco cessation medication provided at discharge   Blood Alcohol level:  Lab Results  Component Value Date   [facility] 02/05/2022   ETH <10 01/31/2022    Metabolic Disorder Labs:  Lab Results  Component Value Date   HGBA1C 5.1 11/21/2021   MPG 99.67 11/21/2021   MPG 96.8 02/22/2019   Lab Results  Component Value Date   PROLACTIN 4.5 11/21/2021   Lab Results  Component Value Date   CHOL 154 11/21/2021   TRIG 68 11/21/2021   HDL 74 11/21/2021   CHOLHDL 2.1 11/21/2021    VLDL 14 11/21/2021   LDLCALC 66 11/21/2021   LDLCALC 66 02/22/2019    See Psychiatric Specialty Exam and Suicide Risk Assessment completed by Attending Physician prior to discharge.  Discharge destination:  Other:  [facility] [place]  Is patient on multiple antipsychotic therapies at discharge:  No   Has Patient had three or more failed trials of antipsychotic monotherapy by history:  No  Recommended Plan for Multiple Antipsychotic Therapies: NA  Discharge Instructions     Ambulatory referral to Infectious Disease   Complete by: As directed    Hep C      Allergies as of 02/14/2022       Reactions   Tomato Itching, Other (See Comments)   Throat and tongue itches        Medication List     STOP taking these medications    furosemide 40 MG tablet Commonly known as: LASIX       TAKE these medications      Indication  escitalopram 10 MG tablet Commonly known as: LEXAPRO Take 1 tablet (10 mg total) by mouth daily.  Indication: Mood disorder   gabapentin 100 MG capsule Commonly known as: NEURONTIN Take 1 capsule (100 mg total) by mouth 3 (three) times daily.  Indication: anxiety/withdrawal   loratadine 10 MG tablet Commonly known as: CLARITIN Take 10 mg by mouth daily as needed for allergies.  Indication: Hayfever   nicotine polacrilex 2 MG gum Commonly known as: NICORETTE Take 1 each (2 mg total) by mouth as needed for smoking cessation.  Indication: Nicotine Addiction   pantoprazole 40 MG tablet Commonly known as: PROTONIX Take 1 tablet (40 mg total) by mouth daily.  Indication: Indigestion   QUEtiapine 100 MG tablet Commonly known as: SEROQUEL Take 1 tablet (100 mg total) by mouth at bedtime.  Indication: Mood symptoms        Follow-up Information     [facility]. Go on 02/22/2022.   Why: You have a hospital follow up appointment to obtain therapy and medication management services and substance use  services on 02/22/22 at 1:00 pm.  This appointment will be held in person. Contact information: [address] [place] [address] [phone]                 Follow-up recommendations:  Patient advised to start [facility] [place] program and to comply with outpatient mental health appointments. He was encouraged to comply with meds and to reschedule with Infectious Disease clinic for treatment of his Hepatitis C. He was advised he will need recheck by  a primary care provider without fail of his liver enzymes and white blood cell count given recent elevation. He will need ongoing monitoring of his lipids, weight, glucose, EKG, CBC and AIMS while on an atypical antipsychotic. He was encouraged to abstain from drugs and alcohol.   Signed: Comer Locket, MD, FAPA 02/14/2022, 5:08 PM

## 2022-02-14 NOTE — Group Note (Signed)
Date:  02/14/2022 Time:  10:26 AM  Group Topic/Focus:  Orientation:   The focus of this group is to educate the patient on the purpose and policies of crisis stabilization and provide a format to answer questions about their admission.  The group details unit policies and expectations of patients while admitted.    Participation Level:  Did Not Attend  Participation Quality:    Affect:    Cognitive:    Insight:   Engagement in Group:    Modes of Intervention:    Additional Comments:    Jaquita Rector 02/14/2022, 10:26 AM

## 2022-02-18 ENCOUNTER — Inpatient Hospital Stay (HOSPITAL_COMMUNITY)
Admission: EM | Admit: 2022-02-18 | Discharge: 2022-02-22 | DRG: 896 | Disposition: A | Discharge status: Home / Self Care | Payer: PRIVATE HEALTH INSURANCE | Attending: Internal Medicine | Admitting: Internal Medicine

## 2022-02-18 ENCOUNTER — Inpatient Hospital Stay (HOSPITAL_COMMUNITY): Payer: PRIVATE HEALTH INSURANCE

## 2022-02-18 ENCOUNTER — Encounter (HOSPITAL_COMMUNITY): Payer: Self-pay | Admitting: Emergency Medicine

## 2022-02-18 ENCOUNTER — Emergency Department (HOSPITAL_COMMUNITY): Payer: PRIVATE HEALTH INSURANCE

## 2022-02-18 DIAGNOSIS — F19121 Other psychoactive substance abuse with intoxication delirium: Principal | ICD-10-CM | POA: Diagnosis present

## 2022-02-18 DIAGNOSIS — F323 Major depressive disorder, single episode, severe with psychotic features: Secondary | ICD-10-CM | POA: Diagnosis present

## 2022-02-18 DIAGNOSIS — M6282 Rhabdomyolysis: Secondary | ICD-10-CM | POA: Diagnosis present

## 2022-02-18 DIAGNOSIS — R410 Disorientation, unspecified: Secondary | ICD-10-CM | POA: Diagnosis present

## 2022-02-18 DIAGNOSIS — F14121 Cocaine abuse with intoxication with delirium: Secondary | ICD-10-CM | POA: Diagnosis present

## 2022-02-18 DIAGNOSIS — T405X1A Poisoning by cocaine, accidental (unintentional), initial encounter: Secondary | ICD-10-CM | POA: Diagnosis present

## 2022-02-18 DIAGNOSIS — F12121 Cannabis abuse with intoxication delirium: Secondary | ICD-10-CM | POA: Diagnosis present

## 2022-02-18 DIAGNOSIS — F15121 Other stimulant abuse with intoxication delirium: Secondary | ICD-10-CM | POA: Diagnosis present

## 2022-02-18 DIAGNOSIS — G928 Other toxic encephalopathy: Secondary | ICD-10-CM | POA: Diagnosis present

## 2022-02-18 DIAGNOSIS — F29 Unspecified psychosis not due to a substance or known physiological condition: Secondary | ICD-10-CM

## 2022-02-18 DIAGNOSIS — R229 Localized swelling, mass and lump, unspecified: Secondary | ICD-10-CM | POA: Diagnosis not present

## 2022-02-18 DIAGNOSIS — F17210 Nicotine dependence, cigarettes, uncomplicated: Secondary | ICD-10-CM | POA: Diagnosis present

## 2022-02-18 DIAGNOSIS — Z79899 Other long term (current) drug therapy: Secondary | ICD-10-CM | POA: Diagnosis not present

## 2022-02-18 DIAGNOSIS — F1910 Other psychoactive substance abuse, uncomplicated: Secondary | ICD-10-CM | POA: Diagnosis not present

## 2022-02-18 DIAGNOSIS — T43621A Poisoning by amphetamines, accidental (unintentional), initial encounter: Secondary | ICD-10-CM | POA: Diagnosis present

## 2022-02-18 DIAGNOSIS — T424X1A Poisoning by benzodiazepines, accidental (unintentional), initial encounter: Secondary | ICD-10-CM | POA: Diagnosis present

## 2022-02-18 DIAGNOSIS — T40711A Poisoning by cannabis, accidental (unintentional), initial encounter: Secondary | ICD-10-CM | POA: Diagnosis present

## 2022-02-18 DIAGNOSIS — Z91018 Allergy to other foods: Secondary | ICD-10-CM | POA: Diagnosis not present

## 2022-02-18 DIAGNOSIS — F322 Major depressive disorder, single episode, severe without psychotic features: Secondary | ICD-10-CM | POA: Diagnosis present

## 2022-02-18 DIAGNOSIS — G9341 Metabolic encephalopathy: Secondary | ICD-10-CM | POA: Diagnosis not present

## 2022-02-18 DIAGNOSIS — N179 Acute kidney failure, unspecified: Secondary | ICD-10-CM | POA: Diagnosis present

## 2022-02-18 DIAGNOSIS — F3160 Bipolar disorder, current episode mixed, unspecified: Secondary | ICD-10-CM | POA: Diagnosis not present

## 2022-02-18 DIAGNOSIS — I10 Essential (primary) hypertension: Secondary | ICD-10-CM | POA: Diagnosis present

## 2022-02-18 DIAGNOSIS — F4310 Post-traumatic stress disorder, unspecified: Secondary | ICD-10-CM | POA: Diagnosis present

## 2022-02-18 DIAGNOSIS — B1920 Unspecified viral hepatitis C without hepatic coma: Secondary | ICD-10-CM | POA: Diagnosis present

## 2022-02-18 LAB — COMPREHENSIVE METABOLIC PANEL
ALT: 32 U/L (ref 0–44)
AST: 48 U/L — ABNORMAL HIGH (ref 15–41)
Albumin: 3.6 g/dL (ref 3.5–5.0)
Alkaline Phosphatase: 68 U/L (ref 38–126)
Anion gap: 16 — ABNORMAL HIGH (ref 5–15)
BUN: 21 mg/dL — ABNORMAL HIGH (ref 6–20)
CO2: 19 mmol/L — ABNORMAL LOW (ref 22–32)
Calcium: 9.3 mg/dL (ref 8.9–10.3)
Chloride: 108 mmol/L (ref 98–111)
Creatinine, Ser: 1.65 mg/dL — ABNORMAL HIGH (ref 0.61–1.24)
GFR, Estimated: 55 mL/min — ABNORMAL LOW (ref >60)
Glucose, Bld: 72 mg/dL (ref 70–99)
Potassium: 4.4 mmol/L (ref 3.5–5.1)
Sodium: 143 mmol/L (ref 135–145)
Total Bilirubin: 0.5 mg/dL (ref 0.3–1.2)
Total Protein: 7.2 g/dL (ref 6.5–8.1)

## 2022-02-18 LAB — CBC WITH DIFFERENTIAL/PLATELET
Abs Immature Granulocytes: 0.1 10*3/uL — ABNORMAL HIGH (ref 0.00–0.07)
Basophils Absolute: 0 10*3/uL (ref 0.0–0.1)
Basophils Relative: 0 %
Eosinophils Absolute: 0.1 10*3/uL (ref 0.0–0.5)
Eosinophils Relative: 1 %
HCT: 34.3 % — ABNORMAL LOW (ref 39.0–52.0)
Hemoglobin: 11.9 g/dL — ABNORMAL LOW (ref 13.0–17.0)
Immature Granulocytes: 1 %
Lymphocytes Relative: 14 %
Lymphs Abs: 1.8 10*3/uL (ref 0.7–4.0)
MCH: 26.9 pg (ref 26.0–34.0)
MCHC: 34.7 g/dL (ref 30.0–36.0)
MCV: 77.4 fL — ABNORMAL LOW (ref 80.0–100.0)
Monocytes Absolute: 0.9 10*3/uL (ref 0.1–1.0)
Monocytes Relative: 7 %
Neutro Abs: 9.9 10*3/uL — ABNORMAL HIGH (ref 1.7–7.7)
Neutrophils Relative %: 77 %
Platelets: 403 10*3/uL — ABNORMAL HIGH (ref 150–400)
RBC: 4.43 MIL/uL (ref 4.22–5.81)
RDW: 15.6 % — ABNORMAL HIGH (ref 11.5–15.5)
WBC: 12.8 10*3/uL — ABNORMAL HIGH (ref 4.0–10.5)
nRBC: 0 % (ref 0.0–0.2)

## 2022-02-18 LAB — GLUCOSE, CAPILLARY
Glucose-Capillary: 84 mg/dL (ref 70–99)
Glucose-Capillary: 80 mg/dL (ref 70–99)

## 2022-02-18 LAB — MAGNESIUM: Magnesium: 2.6 mg/dL — ABNORMAL HIGH (ref 1.7–2.4)

## 2022-02-18 LAB — RAPID URINE DRUG SCREEN, HOSP PERFORMED
Amphetamines: POSITIVE — AB
Barbiturates: NOT DETECTED
Benzodiazepines: POSITIVE — AB
Cocaine: POSITIVE — AB
Opiates: NOT DETECTED
Tetrahydrocannabinol: POSITIVE — AB

## 2022-02-18 LAB — I-STAT ARTERIAL BLOOD GAS, ED
Acid-base deficit: 2 mmol/L (ref 0.0–2.0)
Bicarbonate: 24.2 mmol/L (ref 20.0–28.0)
Calcium, Ion: 1.21 mmol/L (ref 1.15–1.40)
HCT: 30 % — ABNORMAL LOW (ref 39.0–52.0)
Hemoglobin: 10.2 g/dL — ABNORMAL LOW (ref 13.0–17.0)
O2 Saturation: 99 %
Patient temperature: 97.8
Potassium: 4 mmol/L (ref 3.5–5.1)
Sodium: 141 mmol/L (ref 135–145)
TCO2: 26 mmol/L (ref 22–32)
pCO2 arterial: 44.6 mmHg (ref 32–48)
pH, Arterial: 7.341 — ABNORMAL LOW (ref 7.35–7.45)
pO2, Arterial: 175 mmHg — ABNORMAL HIGH (ref 83–108)

## 2022-02-18 LAB — VOLATILES,BLD-ACETONE,ETHANOL,ISOPROP,METHANOL
Acetone, blood: <0.01 g/dL (ref 0.000–0.010)
Ethanol, blood: <0.01 g/dL (ref 0.000–0.010)
Isopropanol, blood: <0.01 g/dL (ref 0.000–0.010)
Methanol, blood: <0.01 g/dL (ref 0.000–0.010)

## 2022-02-18 LAB — CK: Total CK: 811 U/L — ABNORMAL HIGH (ref 49–397)

## 2022-02-18 LAB — MRSA NEXT GEN BY PCR, NASAL: MRSA by PCR Next Gen: NOT DETECTED

## 2022-02-18 LAB — ETHANOL: Alcohol, Ethyl (B): <10 mg/dL (ref <10)

## 2022-02-18 MED ORDER — CHLORHEXIDINE GLUCONATE CLOTH 2 % EX PADS
6.0000 | MEDICATED_PAD | Freq: Every day | CUTANEOUS | Status: DC
Start: 2022-02-19 — End: 2022-02-19

## 2022-02-18 MED ORDER — ORAL CARE MOUTH RINSE
15.0000 mL | OROMUCOSAL | Status: DC
  Administered 2022-02-18 – 2022-02-19 (×6): 15 mL via OROMUCOSAL

## 2022-02-18 MED ORDER — ETOMIDATE 2 MG/ML IV SOLN
INTRAVENOUS | Status: DC | PRN
  Administered 2022-02-18: 20 mg via INTRAVENOUS

## 2022-02-18 MED ORDER — FENTANYL BOLUS VIA INFUSION: 50.0000 ug | INTRAVENOUS | Status: DC | PRN

## 2022-02-18 MED ORDER — DOCUSATE SODIUM 50 MG/5ML PO LIQD
100.0000 mg | Freq: Two times a day (BID) | ORAL | Status: DC
  Administered 2022-02-18: 100 mg
  Filled 2022-02-18: qty 10

## 2022-02-18 MED ORDER — HEPARIN SODIUM (PORCINE) 5000 UNIT/ML IJ SOLN
5000.0000 [IU] | Freq: Three times a day (TID) | INTRAMUSCULAR | Status: DC
  Administered 2022-02-18 – 2022-02-21 (×7): 5000 [IU] via SUBCUTANEOUS
  Filled 2022-02-18 (×8): qty 1

## 2022-02-18 MED ORDER — MIDAZOLAM HCL 2 MG/2ML IJ SOLN
INTRAMUSCULAR | Status: DC | PRN
  Administered 2022-02-18: 2 mg via INTRAVENOUS

## 2022-02-18 MED ORDER — PANTOPRAZOLE 2 MG/ML SUSPENSION
40.0000 mg | Freq: Every day | ORAL | Status: DC
  Administered 2022-02-18: 40 mg
  Filled 2022-02-18 (×2): qty 20

## 2022-02-18 MED ORDER — LACTATED RINGERS IV SOLN
INTRAVENOUS | Status: DC
Start: 2022-02-18 — End: 2022-02-19

## 2022-02-18 MED ORDER — PROPOFOL 1000 MG/100ML IV EMUL
0.0000 ug/kg/min | INTRAVENOUS | Status: DC
  Administered 2022-02-18 – 2022-02-19 (×4): 50 ug/kg/min via INTRAVENOUS
  Filled 2022-02-18 (×5): qty 100

## 2022-02-18 MED ORDER — ONDANSETRON HCL 4 MG/2ML IJ SOLN: 4.0000 mg | Freq: Four times a day (QID) | INTRAMUSCULAR | Status: DC | PRN

## 2022-02-18 MED ORDER — FENTANYL CITRATE PF 50 MCG/ML IJ SOSY
50.0000 ug | PREFILLED_SYRINGE | Freq: Once | INTRAMUSCULAR | Status: AC
  Administered 2022-02-18: 50 ug via INTRAVENOUS

## 2022-02-18 MED ORDER — ROCURONIUM BROMIDE 10 MG/ML (PF) SYRINGE
PREFILLED_SYRINGE | INTRAVENOUS | Status: AC
  Filled 2022-02-18: qty 10

## 2022-02-18 MED ORDER — LORAZEPAM 2 MG/ML IJ SOLN
2.0000 mg | Freq: Once | INTRAMUSCULAR | Status: AC
  Administered 2022-02-18: 2 mg via INTRAVENOUS

## 2022-02-18 MED ORDER — PROPOFOL 1000 MG/100ML IV EMUL
INTRAVENOUS | Status: DC | PRN
  Administered 2022-02-18: 20 ug/kg/min via INTRAVENOUS

## 2022-02-18 MED ORDER — LORAZEPAM 2 MG/ML IJ SOLN
INTRAMUSCULAR | Status: AC
  Filled 2022-02-18: qty 1

## 2022-02-18 MED ORDER — CHLORHEXIDINE GLUCONATE 0.12% ORAL RINSE (MEDLINE KIT)
15.0000 mL | Freq: Two times a day (BID) | OROMUCOSAL | Status: DC
  Administered 2022-02-18 – 2022-02-20 (×5): 15 mL via OROMUCOSAL

## 2022-02-18 MED ORDER — KETAMINE HCL 50 MG/5ML IJ SOSY
PREFILLED_SYRINGE | INTRAMUSCULAR | Status: AC
  Administered 2022-02-18: 50 mg
  Filled 2022-02-18: qty 5

## 2022-02-18 MED ORDER — ACETAMINOPHEN 325 MG PO TABS: 650.0000 mg | ORAL_TABLET | ORAL | Status: DC | PRN

## 2022-02-18 MED ORDER — ETOMIDATE 2 MG/ML IV SOLN
INTRAVENOUS | Status: AC
  Filled 2022-02-18: qty 10

## 2022-02-18 MED ORDER — PANTOPRAZOLE SODIUM 40 MG IV SOLR: 40.0000 mg | Freq: Every day | INTRAVENOUS | Status: DC

## 2022-02-18 MED ORDER — MIDAZOLAM HCL 2 MG/2ML IJ SOLN
2.0000 mg | INTRAMUSCULAR | Status: DC | PRN
  Filled 2022-02-18: qty 2

## 2022-02-18 MED ORDER — DOCUSATE SODIUM 100 MG PO CAPS: 100.0000 mg | ORAL_CAPSULE | Freq: Two times a day (BID) | ORAL | Status: DC | PRN

## 2022-02-18 MED ORDER — DOCUSATE SODIUM 50 MG/5ML PO LIQD: 100.0000 mg | Freq: Two times a day (BID) | ORAL | Status: DC | PRN

## 2022-02-18 MED ORDER — FENTANYL CITRATE PF 50 MCG/ML IJ SOSY
50.0000 ug | PREFILLED_SYRINGE | Freq: Once | INTRAMUSCULAR | Status: AC
  Administered 2022-02-18: 50 ug via INTRAVENOUS
  Filled 2022-02-18: qty 1

## 2022-02-18 MED ORDER — PROPOFOL 1000 MG/100ML IV EMUL
0.0000 ug/kg/min | INTRAVENOUS | Status: DC
  Administered 2022-02-18: 30 ug/kg/min via INTRAVENOUS

## 2022-02-18 MED ORDER — FENTANYL CITRATE (PF) 100 MCG/2ML IJ SOLN
INTRAMUSCULAR | Status: DC | PRN
  Administered 2022-02-18: 50 ug via INTRAVENOUS

## 2022-02-18 MED ORDER — POLYETHYLENE GLYCOL 3350 17 G PO PACK: 17.0000 g | PACK | Freq: Every day | ORAL | Status: DC | PRN

## 2022-02-18 MED ORDER — ROCURONIUM BROMIDE 50 MG/5ML IV SOLN
INTRAVENOUS | Status: DC | PRN
  Administered 2022-02-18: 100 mg via INTRAVENOUS

## 2022-02-18 MED ORDER — MIDAZOLAM HCL 2 MG/2ML IJ SOLN: 2.0000 mg | INTRAMUSCULAR | Status: DC | PRN

## 2022-02-18 MED ORDER — PANTOPRAZOLE SODIUM 40 MG IV SOLR
40.0000 mg | Freq: Every day | INTRAVENOUS | Status: DC
Start: 2022-02-18 — End: 2022-02-18

## 2022-02-18 MED ORDER — POLYETHYLENE GLYCOL 3350 17 G PO PACK
17.0000 g | PACK | Freq: Every day | ORAL | Status: DC
  Administered 2022-02-18: 17 g
  Filled 2022-02-18: qty 1

## 2022-02-18 MED ORDER — FENTANYL 2500MCG IN NS 250ML (10MCG/ML) PREMIX INFUSION
50.0000 ug/h | INTRAVENOUS | Status: DC
  Administered 2022-02-19: 150 ug/h via INTRAVENOUS
  Filled 2022-02-18: qty 250

## 2022-02-18 MED ORDER — FENTANYL 2500MCG IN NS 250ML (10MCG/ML) PREMIX INFUSION
50.0000 ug/h | INTRAVENOUS | Status: DC
  Administered 2022-02-18: 50 ug/h via INTRAVENOUS
  Filled 2022-02-18: qty 250

## 2022-02-18 NOTE — H&P (Addendum)
NAME:  Tristan [name], MRN:  [id number], DOB:  1986-05-22, LOS: 0 ADMISSION DATE:  02/18/2022, CONSULTATION DATE:  02/18/22 REFERRING MD:  Rhunette CroftNanavati - EM, CHIEF COMPLAINT:  agitation    History of Present Illness:   36 yo M PMH polysubstance abuse, ptsd, hep c, bipolar 1 [facility]. Pt was found rolling around on ground with belligerent behavior. Possible fall in the road. Received narcan and 400mg  IM ketamine with EMS.  Per EMS, patient actually called EMS earlier this day (5/29) with concern of a drug overdose, self administered narcan but refused narcan.  In ED, patient reportedly had ongoing belligerent behavior despite efforts to de-escalate, and ultimately for patient safety he required sedation and intubation.   High frequency of ED presentations-- looks like 24th in 6 mo   PCCM consulted in this setting   Pertinent  Medical History  PTSD Bipolar 1 Hep C Polysubstance abuse   Significant Hospital Events: Including procedures, antibiotic start and stop dates in addition to other pertinent events   ED after being found belligerent rolling on ground. Intubated. Admit to PCCM   Interim History / Subjective:   Intubated for patient safety because of belligerent behavior   Objective   Blood pressure (!) 110/52, pulse (!) 110, temperature 97.8 F (36.6 C), temperature source Axillary, resp. rate 18, height 5\' 10"  (1.778 m), weight 85 kg, SpO2 100 %.    Vent Mode: PRVC FiO2 (%):  [40 %] 40 % Set Rate:  [18 bmp] 18 bmp Vt Set:  [580 mL] 580 mL PEEP:  [5 cmH20] 5 cmH20 Plateau Pressure:  [12 cmH20] 12 cmH20  No intake or output data in the 24 hours ending 02/18/22 1543 Filed Weights   02/18/22 1432  Weight: 85 kg    Examination: General: WDWN M intubated sedated  HENT: NCAT ETT secure Lungs: CTAb on MV Cardiovascular: rrr s1s2 no rgm  Abdomen: soft ndnt  Extremities: R foot green discoloration. Scattered abrasions, scars Neuro:  sedated. 3mm pupils. Not following commands  GU: defer  Resolved Hospital Problem list     Assessment & Plan:   Acute toxic metabolic encephalopathy, with acute agitation  Polysubstance abuse with suspected acute intoxication vs withdrawal Possible fall  Hx PTSD, Bipolar 1 -suspected acute intoxication. Recent UDS + for amphetamines, BZD, cocaine, Marijuana  P -UDS -will send volatiles  -RASS goal -2  -- prop fent  -CT head, CT c spine, add on CT chest  -toc consult  -would restart psych meds before extubation, but with current presentation defer for now.   Endotracheally intubated for airway protection in setting of acute encephalopathy  -pre intubation cxr without acute process. No post intubation film yet P -ABG -Wean as able -VAP, pulm hygiene -follow CT chest   AKI  Elevated CK -1.65 from 1.41 a few days ago -CK 811 P -IVF  Best Practice (right click and "Reselect all SmartList Selections" daily)   Diet/type: NPO DVT prophylaxis: prophylactic heparin  GI prophylaxis: PPI Lines: N/A Foley:  N/A Code Status:  full code Last date of multidisciplinary goals of care discussion [--]  Labs   CBC: Recent Labs  Lab 02/14/22 2011 02/18/22 1443  WBC 15.4* 12.8*  NEUTROABS 10.8* 9.9*  HGB 13.6 11.9*  HCT 39.9 34.3*  MCV 77.8* 77.4*  PLT 462* 403*    Basic Metabolic Panel: Recent Labs  Lab 02/14/22 2011 02/18/22 1443  NA 138 143  K 5.6* 4.4  CL 106 108  CO2 25 19*  GLUCOSE 108* 72  BUN 17 21*  CREATININE 1.41* 1.65*  CALCIUM 9.3 9.3   GFR: Estimated Creatinine Clearance: 63.9 mL/min (A) (by C-G formula based on SCr of 1.65 mg/dL (H)). Recent Labs  Lab 02/14/22 2011 02/18/22 1443  WBC 15.4* 12.8*    Liver Function Tests: Recent Labs  Lab 02/14/22 2011 02/18/22 1443  AST 32 48*  ALT 40 32  ALKPHOS 73 68  BILITOT 0.5 0.5  PROT 8.5* 7.2  ALBUMIN 4.4 3.6   No results for input(s): LIPASE, AMYLASE in the last 168 hours. No results for  input(s): AMMONIA in the last 168 hours.  ABG    Component Value Date/Time   PHART 7.418 10/27/2021 2050   PCO2ART 38.0 10/27/2021 2050   PO2ART 306 (H) 10/27/2021 2050   HCO3 24.3 02/05/2022 2133   TCO2 26 02/05/2022 2133   ACIDBASEDEF 1.0 02/05/2022 2133   O2SAT 95 02/05/2022 2133     Coagulation Profile: No results for input(s): INR, PROTIME in the last 168 hours.  Cardiac Enzymes: Recent Labs  Lab 02/13/22 0613 02/18/22 1443  CKTOTAL 197 811*    HbA1C: Hgb A1c MFr Bld  Date/Time Value Ref Range Status  11/21/2021 07:54 PM 5.1 4.8 - 5.6 % Final    Comment:    (NOTE) Pre diabetes:          5.7%-6.4%  Diabetes:              >6.4%  Glycemic control for   <7.0% adults with diabetes   02/22/2019 06:23 AM 5.0 4.8 - 5.6 % Final    Comment:    (NOTE) Pre diabetes:          5.7%-6.4% Diabetes:              >6.4% Glycemic control for   <7.0% adults with diabetes     CBG: Recent Labs  Lab 02/14/22 2021  GLUCAP 108*    Review of Systems:   Unable to obtain intubated sedated  Past Medical History:  He,  has a past medical history of Bipolar 1 disorder (HCC), Hepatitis C, Hypertension, and PTSD (post-traumatic stress disorder).   Surgical History:   Past Surgical History:  Procedure Laterality Date   NO PAST SURGERIES       Social History:   reports that he has been smoking cigarettes. He has been smoking an average of .5 packs per day. He has never used smokeless tobacco. He reports current alcohol use. He reports current drug use. Drugs: Marijuana, Heroin, Cocaine, Methamphetamines, and Morphine.   Family History:  His family history includes Other in his father; Psychiatric Illness in his father.   Allergies Allergies  Allergen Reactions   Tomato Itching and Other (See Comments)    Throat and tongue itches     Home Medications  Prior to Admission medications   Medication Sig Start Date End Date Taking? Authorizing Provider  escitalopram  (LEXAPRO) 10 MG tablet Take 1 tablet (10 mg total) by mouth daily. 02/14/22 03/16/22  Comer Locket, MD  gabapentin (NEURONTIN) 100 MG capsule Take 1 capsule (100 mg total) by mouth 3 (three) times daily. 02/14/22 03/16/22  Comer Locket, MD  loratadine (CLARITIN) 10 MG tablet Take 10 mg by mouth daily as needed for allergies.    [provider]  nicotine polacrilex (NICORETTE) 2 MG gum Take 1 each (2 mg total) by mouth as needed for smoking cessation. 02/14/22 03/16/22  Comer Locket, MD  pantoprazole (PROTONIX) 40 MG tablet Take 1 tablet (40 mg total) by mouth daily. 01/23/22   Karsten Ro, MD  QUEtiapine (SEROQUEL) 100 MG tablet Take 1 tablet (100 mg total) by mouth at bedtime. 02/14/22 03/16/22  Comer Locket, MD     Critical care time:     CRITICAL CARE Performed by: Lanier Clam   Total critical care time: 45 minutes  Critical care time was exclusive of separately billable procedures and treating other patients.  Critical care was necessary to treat or prevent imminent or life-threatening deterioration.  Critical care was time spent personally by me on the following activities: development of treatment plan with patient and/or surrogate as well as nursing, discussions with consultants, evaluation of patient's response to treatment, examination of patient, obtaining history from patient or surrogate, ordering and performing treatments and interventions, ordering and review of laboratory studies, ordering and review of radiographic studies, pulse oximetry and re-evaluation of patient's condition.  Tessie Fass MSN, AGACNP-BC [facility] Pulmonary/Critical Care Medicine Amion for pager 02/18/2022, 3:43 PM

## 2022-02-18 NOTE — ED Provider Notes (Addendum)
[facility] EMERGENCY DEPARTMENT Provider Note   CSN: [id number] Arrival date & time: 02/18/22  1413     History  Chief Complaint  Patient presents with   z04.6/DELIRIUM    Tristan [name] is a 36 y.o. male.  HPI     36 year old male comes in with chief complaint of agitation.  Patient arrives escorted by police officer and with EMS.  According to the police, patient was involved in some larceny earlier today.  They were able to track him down later in the day.  Patient was found walking on the street, intentionally falling on his back, striking his head and then rolling over.  He continued to behave this way despite police his effort to redirect him.  EMS had to be called.  EMS gave patient 400 mg of IM ketamine and Narcan.  Patient placed in c-collar.  He is arriving to the ER unresponsive, on nonrebreather.  Reviewed patient's chart.  He has history of polysubstance use including meth, cocaine.  Patient was seen in the ER recently with aggressive behavior as well.  Home Medications Prior to Admission medications   Medication Sig Start Date End Date Taking? Authorizing Provider  escitalopram (LEXAPRO) 10 MG tablet Take 1 tablet (10 mg total) by mouth daily. 02/14/22 03/16/22  Harlow Asa, MD  gabapentin (NEURONTIN) 100 MG capsule Take 1 capsule (100 mg total) by mouth 3 (three) times daily. 02/14/22 03/16/22  Harlow Asa, MD  loratadine (CLARITIN) 10 MG tablet Take 10 mg by mouth daily as needed for allergies.    [provider]  nicotine polacrilex (NICORETTE) 2 MG gum Take 1 each (2 mg total) by mouth as needed for smoking cessation. 02/14/22 03/16/22  Harlow Asa, MD  pantoprazole (PROTONIX) 40 MG tablet Take 1 tablet (40 mg total) by mouth daily. 01/23/22   Armando Reichert, MD  QUEtiapine (SEROQUEL) 100 MG tablet Take 1 tablet (100 mg total) by mouth at bedtime. 02/14/22 03/16/22  Harlow Asa, MD      Allergies    Tomato     Review of Systems   Review of Systems  Physical Exam Updated Vital Signs BP 130/65   Pulse (!) 102   Temp 97.8 F (36.6 C) (Axillary)   Resp 20   Ht $R'5\' 10"'zL$  (1.778 m)   Wt 85 kg   SpO2 100%   BMI 26.89 kg/m  Physical Exam Vitals and nursing note reviewed.  Constitutional:      Appearance: He is well-developed.     Comments: Somnolent  HENT:     Head: Atraumatic.  Eyes:     Pupils: Pupils are equal, round, and reactive to light.  Cardiovascular:     Rate and Rhythm: Normal rate.  Pulmonary:     Effort: Pulmonary effort is normal.  Musculoskeletal:     Cervical back: Neck supple.  Skin:    General: Skin is warm.  Neurological:     Mental Status: He is disoriented.     Comments: Moving all 4 extremities to noxious stimuli    ED Results / Procedures / Treatments   Labs (all labs ordered are listed, but only abnormal results are displayed) Labs Reviewed  CBC WITH DIFFERENTIAL/PLATELET - Abnormal; Notable for the following components:      Result Value   WBC 12.8 (*)    Hemoglobin 11.9 (*)    HCT 34.3 (*)    MCV 77.4 (*)    RDW 15.6 (*)  Platelets 403 (*)    Neutro Abs 9.9 (*)    Abs Immature Granulocytes 0.10 (*)    All other components within normal limits  COMPREHENSIVE METABOLIC PANEL - Abnormal; Notable for the following components:   CO2 19 (*)    BUN 21 (*)    Creatinine, Ser 1.65 (*)    AST 48 (*)    GFR, Estimated 55 (*)    Anion gap 16 (*)    All other components within normal limits  CK - Abnormal; Notable for the following components:   Total CK 811 (*)    All other components within normal limits  I-STAT ARTERIAL BLOOD GAS, ED - Abnormal; Notable for the following components:   pH, Arterial 7.341 (*)    pO2, Arterial 175 (*)    HCT 30.0 (*)    Hemoglobin 10.2 (*)    All other components within normal limits  ETHANOL  RAPID URINE DRUG SCREEN, HOSP PERFORMED  CBC  VOLATILES,BLD-ACETONE,ETHANOL,ISOPROP,METHANOL  BLOOD GAS, ARTERIAL   MAGNESIUM  TRIGLYCERIDES  CBC  BASIC METABOLIC PANEL  CK  I-STAT VENOUS BLOOD GAS, ED    EKG EKG Interpretation  Date/Time:  Monday Feb 18 2022 14:25:09 EDT Ventricular Rate:  100 PR Interval:  140 QRS Duration: 89 QT Interval:  368 QTC Calculation: 475 R Axis:   89 Text Interpretation: Sinus tachycardia No acute changes No significant change since last tracing Confirmed by Varney Biles [phone]) on 02/18/2022 4:15:25 PM  Radiology CT Head Wo Contrast  Result Date: 02/18/2022 CLINICAL DATA:  Found down, combative EXAM: CT HEAD WITHOUT CONTRAST CT CERVICAL SPINE WITHOUT CONTRAST TECHNIQUE: Multidetector CT imaging of the head and cervical spine was performed following the standard protocol without intravenous contrast. Multiplanar CT image reconstructions of the cervical spine were also generated. RADIATION DOSE REDUCTION: This exam was performed according to the departmental dose-optimization program which includes automated exposure control, adjustment of the mA and/or kV according to patient size and/or use of iterative reconstruction technique. COMPARISON:  02/05/2022 FINDINGS: CT HEAD FINDINGS Brain: No acute infarct or hemorrhage. Lateral ventricles and midline structures are unremarkable. No acute extra-axial fluid collections. No mass effect. Vascular: No hyperdense vessel or unexpected calcification. Skull: Normal. Negative for fracture or focal lesion. Sinuses/Orbits: No acute finding. Other: None. CT CERVICAL SPINE FINDINGS Alignment: Alignment is anatomic. Skull base and vertebrae: No acute fracture. No primary bone lesion or focal pathologic process. Soft tissues and spinal canal: No prevertebral fluid or swelling. No visible canal hematoma. Disc levels:  No significant spondylosis or facet hypertrophy. Upper chest: Patient is intubated. Distal margins of the endotracheal tube and enteric catheter are excluded by slice selection. Mild emphysema at the lung apices. Other:  Reconstructed images demonstrate no additional findings. IMPRESSION: 1. No acute intracranial process. 2. No acute cervical spine fracture. Electronically Signed   By: Randa Ngo M.D.   On: 02/18/2022 17:18   CT Cervical Spine Wo Contrast  Result Date: 02/18/2022 CLINICAL DATA:  Found down, combative EXAM: CT HEAD WITHOUT CONTRAST CT CERVICAL SPINE WITHOUT CONTRAST TECHNIQUE: Multidetector CT imaging of the head and cervical spine was performed following the standard protocol without intravenous contrast. Multiplanar CT image reconstructions of the cervical spine were also generated. RADIATION DOSE REDUCTION: This exam was performed according to the departmental dose-optimization program which includes automated exposure control, adjustment of the mA and/or kV according to patient size and/or use of iterative reconstruction technique. COMPARISON:  02/05/2022 FINDINGS: CT HEAD FINDINGS Brain: No acute infarct or hemorrhage.  Lateral ventricles and midline structures are unremarkable. No acute extra-axial fluid collections. No mass effect. Vascular: No hyperdense vessel or unexpected calcification. Skull: Normal. Negative for fracture or focal lesion. Sinuses/Orbits: No acute finding. Other: None. CT CERVICAL SPINE FINDINGS Alignment: Alignment is anatomic. Skull base and vertebrae: No acute fracture. No primary bone lesion or focal pathologic process. Soft tissues and spinal canal: No prevertebral fluid or swelling. No visible canal hematoma. Disc levels:  No significant spondylosis or facet hypertrophy. Upper chest: Patient is intubated. Distal margins of the endotracheal tube and enteric catheter are excluded by slice selection. Mild emphysema at the lung apices. Other: Reconstructed images demonstrate no additional findings. IMPRESSION: 1. No acute intracranial process. 2. No acute cervical spine fracture. Electronically Signed   By: Randa Ngo M.D.   On: 02/18/2022 17:18   DG Chest Portable 1  View  Result Date: 02/18/2022 CLINICAL DATA:  Intubation. EXAM: PORTABLE CHEST 1 VIEW COMPARISON:  AP chest 02/18/2022 at 1449 hours. FINDINGS: New endotracheal tube tip terminates approximately 4 cm above the carina in between the thoracic inlet and the carina. Enteric tube descends below the diaphragm with the side port overlying the left upper abdominal quadrant in the tip excluded by collimation. Cardiac silhouette and mediastinal contours are within normal limits. The lungs are clear. No pleural effusion or pneumothorax. IMPRESSION: 1. New endotracheal tube and enteric tube in appropriate position. 2. No acute lung process. Electronically Signed   By: Yvonne Kendall M.D.   On: 02/18/2022 16:53   DG Chest Port 1 View  Result Date: 02/18/2022 CLINICAL DATA:  Patient found in the street rolling around, acting belligerent. Combative. EXAM: PORTABLE CHEST 1 VIEW COMPARISON:  02/14/2022 FINDINGS: Cardiac silhouette is normal in size and configuration. Normal mediastinal and hilar contours. Clear lungs.  No pleural effusion or pneumothorax. Skeletal structures are grossly intact. IMPRESSION: No active disease. Electronically Signed   By: Lajean Manes M.D.   On: 02/18/2022 15:24    Procedures .Critical Care Performed by: Varney Biles, MD Authorized by: Varney Biles, MD   Critical care provider statement:    Critical care time (minutes):  38   Critical care was necessary to treat or prevent imminent or life-threatening deterioration of the following conditions:  CNS failure or compromise   Critical care was time spent personally by me on the following activities:  Development of treatment plan with patient or surrogate, discussions with consultants, evaluation of patient's response to treatment, examination of patient, ordering and review of laboratory studies, ordering and review of radiographic studies, ordering and performing treatments and interventions, pulse oximetry, re-evaluation of  patient's condition and review of old charts Procedure Name: Intubation Date/Time: 02/18/2022 5:28 PM Performed by: Varney Biles, MD Pre-anesthesia Checklist: Patient identified, Patient being monitored, Emergency Drugs available, Timeout performed and Suction available Oxygen Delivery Method: Non-rebreather mask Preoxygenation: Pre-oxygenation with 100% oxygen Induction Type: Rapid sequence Ventilation: Mask ventilation without difficulty Laryngoscope Size: Glidescope and 3 Grade View: Grade II Tube size: 8.0 mm Number of attempts: 1 Placement Confirmation: ETT inserted through vocal cords under direct vision, CO2 detector and Breath sounds checked- equal and bilateral Secured at: 25 cm Tube secured with: Tape       Medications Ordered in ED Medications  LORazepam (ATIVAN) 2 MG/ML injection (has no administration in time range)  midazolam (VERSED) injection (2 mg Intravenous Given 02/18/22 1500)  midazolam (VERSED) injection 2 mg (has no administration in time range)  midazolam (VERSED) injection 2 mg (has no administration in  time range)  LORazepam (ATIVAN) 2 MG/ML injection (has no administration in time range)  rocuronium bromide 100 MG/10ML SOSY (has no administration in time range)  etomidate (AMIDATE) 2 MG/ML injection (has no administration in time range)  heparin injection 5,000 Units (has no administration in time range)  pantoprazole sodium (PROTONIX) 40 mg/20 mL oral suspension 40 mg (has no administration in time range)  lactated ringers infusion (has no administration in time range)  acetaminophen (TYLENOL) tablet 650 mg (has no administration in time range)  polyethylene glycol (MIRALAX / GLYCOLAX) packet 17 g (has no administration in time range)  ondansetron (ZOFRAN) injection 4 mg (has no administration in time range)  docusate (COLACE) 50 MG/5ML liquid 100 mg (has no administration in time range)  docusate (COLACE) 50 MG/5ML liquid 100 mg (has no  administration in time range)  polyethylene glycol (MIRALAX / GLYCOLAX) packet 17 g (has no administration in time range)  chlorhexidine gluconate (MEDLINE KIT) (PERIDEX) 0.12 % solution 15 mL (has no administration in time range)  MEDLINE mouth rinse (has no administration in time range)  propofol (DIPRIVAN) 1000 MG/100ML infusion (has no administration in time range)  fentaNYL (SUBLIMAZE) injection 50 mcg (has no administration in time range)  fentaNYL 2513mg in NS 2533m(106mml) infusion-PREMIX (has no administration in time range)  fentaNYL (SUBLIMAZE) bolus via infusion 50-100 mcg (has no administration in time range)  ketamine HCl 50 MG/5ML SOSY (50 mg  Given 02/18/22 1454)  ketamine HCl 50 MG/5ML SOSY (50 mg  Given 02/18/22 1448)  LORazepam (ATIVAN) injection 2 mg (2 mg Intravenous Given 02/18/22 1459)  fentaNYL (SUBLIMAZE) injection 50 mcg (50 mcg Intravenous Given 02/18/22 1516)    ED Course/ Medical Decision Making/ A&P                           Medical Decision Making Amount and/or Complexity of Data Reviewed Labs: ordered. Radiology: ordered.  Risk Prescription drug management. Decision regarding hospitalization.   [age]ar old male comes to the ER with chief complaint of altered mental status.  Patient has history of polysubstance use, hypertension, PTSD.  He was found to falling on the street earlier and rolling on the road.  He was not coherent, not directable and required ketamine by EMS.  At arrival, patient was minimally responsive.  He was on nonrebreather.  He was responding to noxious stimuli.  Soon after, patient became agitated.  He was placed in restraints.  Despite being in restraint, he was able to sit up and bang his head against the bed.  We had to physically restrain him again.  Patient was given 2 of Ativan followed by 100 of ketamine.  He continued to have hostile behavior that required multiple people to physically restrain him.  Patient given 5 mg of  Versed, and decision was made to intubate him.  Labs ordered and independently reviewed include i-STAT ABG which showed normal pH, normal oxygenation.  X-ray visualized include CT scan of the brain which reveals no evidence of brain bleed.  Patient's x-ray was visualized and interpreted independently, ET tube is in place satisfactorily.  Critical care consulted for admission.  Addendum: Pt came in somnolent. Non-violent restraint placed as he was quite combative prior to ED arrival and was post ketamine. Pt then became violent in the ER. He was physically restrained. We discussed violent restraints would be needed. Pt however didn't respond to additional doses of sedatives, and we had to intubate. Reassessed at 3:30  pm, he was on continuous sedation, on vent.  Final Clinical Impression(s) / ED Diagnoses Final diagnoses:  Polysubstance abuse ([place])  Psychosis, unspecified psychosis type [facility])    Rx / DC Orders ED Discharge Orders     None         Varney Biles, MD 02/18/22 [place], Lynnetta Tom, MD 02/21/22 1216

## 2022-02-18 NOTE — ED Triage Notes (Signed)
Pt BIB [facility] escorted with [employer]. Patient found in the street rolling around, falling, acting belligerent. Patient presented to EMS with excited delirium. Combative with EMS- given 1 mg of Narcan and 400 mg of ketamine IM. Placed in c-collar for concerns of neck trauma as patient fell in the road. Per EMS the fire department responded to this patient earlier today for concerns of a drug overdose as patient self administered Narcan but refused follow up at this hospital.   VS:  BP-117/70  HR=104 SPO2-100% NRB w/ NPA in CBG- 84

## 2022-02-18 NOTE — Progress Notes (Signed)
Patient transported to 2M04 from ED without complications. RN at bedside. °

## 2022-02-19 ENCOUNTER — Inpatient Hospital Stay (HOSPITAL_COMMUNITY): Payer: PRIVATE HEALTH INSURANCE

## 2022-02-19 ENCOUNTER — Encounter: Payer: Self-pay | Admitting: Emergency Medicine

## 2022-02-19 DIAGNOSIS — F29 Unspecified psychosis not due to a substance or known physiological condition: Secondary | ICD-10-CM

## 2022-02-19 DIAGNOSIS — G9341 Metabolic encephalopathy: Secondary | ICD-10-CM | POA: Diagnosis not present

## 2022-02-19 LAB — BASIC METABOLIC PANEL
Anion gap: 8 (ref 5–15)
BUN: 20 mg/dL (ref 6–20)
CO2: 24 mmol/L (ref 22–32)
Calcium: 8.5 mg/dL — ABNORMAL LOW (ref 8.9–10.3)
Chloride: 110 mmol/L (ref 98–111)
Creatinine, Ser: 1.25 mg/dL — ABNORMAL HIGH (ref 0.61–1.24)
GFR, Estimated: >60 mL/min (ref >60)
Glucose, Bld: 68 mg/dL — ABNORMAL LOW (ref 70–99)
Potassium: 4 mmol/L (ref 3.5–5.1)
Sodium: 142 mmol/L (ref 135–145)

## 2022-02-19 LAB — GLUCOSE, CAPILLARY
Glucose-Capillary: 83 mg/dL (ref 70–99)
Glucose-Capillary: 72 mg/dL (ref 70–99)
Glucose-Capillary: 71 mg/dL (ref 70–99)
Glucose-Capillary: 66 mg/dL — ABNORMAL LOW (ref 70–99)
Glucose-Capillary: 107 mg/dL — ABNORMAL HIGH (ref 70–99)
Glucose-Capillary: 61 mg/dL — ABNORMAL LOW (ref 70–99)
Glucose-Capillary: 90 mg/dL (ref 70–99)
Glucose-Capillary: 143 mg/dL — ABNORMAL HIGH (ref 70–99)
Glucose-Capillary: 115 mg/dL — ABNORMAL HIGH (ref 70–99)

## 2022-02-19 LAB — CK: Total CK: 1048 U/L — ABNORMAL HIGH (ref 49–397)

## 2022-02-19 LAB — CBC
HCT: 31.3 % — ABNORMAL LOW (ref 39.0–52.0)
Hemoglobin: 11 g/dL — ABNORMAL LOW (ref 13.0–17.0)
MCH: 26.5 pg (ref 26.0–34.0)
MCHC: 35.1 g/dL (ref 30.0–36.0)
MCV: 75.4 fL — ABNORMAL LOW (ref 80.0–100.0)
Platelets: 357 10*3/uL (ref 150–400)
RBC: 4.15 MIL/uL — ABNORMAL LOW (ref 4.22–5.81)
RDW: 15.5 % (ref 11.5–15.5)
WBC: 10.7 10*3/uL — ABNORMAL HIGH (ref 4.0–10.5)
nRBC: 0 % (ref 0.0–0.2)

## 2022-02-19 LAB — TRIGLYCERIDES: Triglycerides: 32 mg/dL (ref <150)

## 2022-02-19 MED ORDER — CHLORHEXIDINE GLUCONATE CLOTH 2 % EX PADS
6.0000 | MEDICATED_PAD | Freq: Every day | CUTANEOUS | Status: DC
  Administered 2022-02-19 – 2022-02-20 (×2): 6 via TOPICAL

## 2022-02-19 MED ORDER — FOLIC ACID 1 MG PO TABS
1.0000 mg | ORAL_TABLET | Freq: Every day | ORAL | Status: DC
  Administered 2022-02-20 – 2022-02-22 (×3): 1 mg via ORAL
  Filled 2022-02-19 (×3): qty 1

## 2022-02-19 MED ORDER — PANTOPRAZOLE SODIUM 40 MG PO TBEC
40.0000 mg | DELAYED_RELEASE_TABLET | Freq: Every day | ORAL | Status: DC
  Administered 2022-02-20 – 2022-02-21 (×2): 40 mg via ORAL
  Filled 2022-02-19 (×3): qty 1

## 2022-02-19 MED ORDER — THIAMINE HCL 100 MG PO TABS
100.0000 mg | ORAL_TABLET | Freq: Every day | ORAL | Status: DC
  Administered 2022-02-20 – 2022-02-22 (×3): 100 mg via ORAL
  Filled 2022-02-19 (×3): qty 1

## 2022-02-19 MED ORDER — ADULT MULTIVITAMIN W/MINERALS CH
1.0000 | ORAL_TABLET | Freq: Every day | ORAL | Status: DC
  Administered 2022-02-20 – 2022-02-22 (×3): 1 via ORAL
  Filled 2022-02-19 (×3): qty 1

## 2022-02-19 MED ORDER — POLYETHYLENE GLYCOL 3350 17 G PO PACK
17.0000 g | PACK | Freq: Every day | ORAL | Status: DC
  Administered 2022-02-20: 17 g via ORAL
  Filled 2022-02-19 (×2): qty 1

## 2022-02-19 MED ORDER — THIAMINE HCL 100 MG PO TABS: 100.0000 mg | ORAL_TABLET | Freq: Every day | ORAL | Status: DC

## 2022-02-19 MED ORDER — POLYETHYLENE GLYCOL 3350 17 G PO PACK: 17.0000 g | PACK | Freq: Every day | ORAL | Status: DC | PRN

## 2022-02-19 MED ORDER — QUETIAPINE FUMARATE 100 MG PO TABS: 100.0000 mg | ORAL_TABLET | Freq: Every day | ORAL | Status: DC

## 2022-02-19 MED ORDER — FOLIC ACID 1 MG PO TABS
1.0000 mg | ORAL_TABLET | Freq: Every day | ORAL | Status: DC
Start: 2022-02-20 — End: 2022-02-19

## 2022-02-19 MED ORDER — QUETIAPINE FUMARATE 50 MG PO TABS
100.0000 mg | ORAL_TABLET | Freq: Every day | ORAL | Status: DC
Start: 2022-02-19 — End: 2022-02-22
  Administered 2022-02-19 – 2022-02-21 (×3): 100 mg via ORAL
  Filled 2022-02-19 (×2): qty 1
  Filled 2022-02-19: qty 2

## 2022-02-19 MED ORDER — LORAZEPAM 2 MG/ML IJ SOLN: 2.0000 mg | INTRAMUSCULAR | Status: DC | PRN

## 2022-02-19 MED ORDER — DEXTROSE 50 % IV SOLN
INTRAVENOUS | Status: AC
  Administered 2022-02-19: 12.5 g
  Filled 2022-02-19: qty 50

## 2022-02-19 MED ORDER — ACETAMINOPHEN 325 MG PO TABS
650.0000 mg | ORAL_TABLET | ORAL | Status: DC | PRN
  Administered 2022-02-19 – 2022-02-20 (×2): 650 mg via ORAL
  Filled 2022-02-19 (×3): qty 2

## 2022-02-19 MED ORDER — DOXAZOSIN MESYLATE 2 MG PO TABS
2.0000 mg | ORAL_TABLET | Freq: Once | ORAL | Status: AC
Start: 2022-02-19 — End: 2022-02-19
  Administered 2022-02-19: 2 mg
  Filled 2022-02-19: qty 1

## 2022-02-19 MED ORDER — ADULT MULTIVITAMIN W/MINERALS CH: 1.0000 | ORAL_TABLET | Freq: Every day | ORAL | Status: DC

## 2022-02-19 MED ORDER — DOCUSATE SODIUM 100 MG PO CAPS
100.0000 mg | ORAL_CAPSULE | Freq: Two times a day (BID) | ORAL | Status: DC
  Administered 2022-02-19 – 2022-02-21 (×4): 100 mg via ORAL
  Filled 2022-02-19 (×5): qty 1

## 2022-02-19 MED ORDER — DOCUSATE SODIUM 100 MG PO CAPS
100.0000 mg | ORAL_CAPSULE | Freq: Two times a day (BID) | ORAL | Status: DC | PRN
Start: 2022-02-19 — End: 2022-02-20

## 2022-02-19 NOTE — Progress Notes (Signed)
NAME:  Tristan [name], MRN:  [id number], DOB:  23-Oct-1985, LOS: 1 ADMISSION DATE:  02/18/2022, CONSULTATION DATE:  02/18/22 REFERRING MD:  Rhunette Croft - EM, CHIEF COMPLAINT:  agitation    History of Present Illness:   36 yo M PMH polysubstance abuse, ptsd, hep c, bipolar 1 who presented to [facility] 5/29 via [facility]/EMS. Pt was found rolling around on ground with belligerent behavior. Possible fall in the road. Received narcan and 400mg  IM ketamine with EMS.  Per EMS, patient actually called EMS earlier this day (5/29) with concern of a drug overdose, self administered narcan but refused narcan.  In ED, patient reportedly had ongoing belligerent behavior despite efforts to de-escalate, and ultimately for patient safety he required sedation and intubation.   High frequency of ED presentations-- looks like 24th in 6 mo   PCCM consulted in this setting   Pertinent  Medical History  PTSD Bipolar 1 Hep C Polysubstance abuse   Significant Hospital Events: Including procedures, antibiotic start and stop dates in addition to other pertinent events   5/29 ED after being found belligerent rolling on ground. Intubated. Admit to [facility]  5/30 extubated, improving encephalopathy  Interim History / Subjective:  Weaned off sedation and extubated, still intermittently agitated  Objective   Blood pressure 134/75, pulse (!) 101, temperature 99.1 F (37.3 C), temperature source Oral, resp. rate 20, height 5\' 10"  (1.778 m), weight 84.2 kg, SpO2 93 %.    Vent Mode: PRVC FiO2 (%):  [40 %] 40 % Set Rate:  [18 bmp-20 bmp] 20 bmp Vt Set:  [580 mL] 580 mL PEEP:  [5 cmH20] 5 cmH20 Plateau Pressure:  [12 cmH20-17 cmH20] 16 cmH20   Intake/Output Summary (Last 24 hours) at 02/19/2022 0753 Last data filed at 02/19/2022 0600 Gross per 24 hour  Intake 1753.04 ml  Output 1065 ml  Net 688.04 ml   Filed Weights   02/18/22 1432 02/19/22 0458  Weight: 85 kg 84.2 kg    General:  examined intubated and sedated,  grimacing to pain and opening eyes HEENT: MM pink/moist, sclera injected PERRL Neuro: examined on propofol and fentanyl, grimaces to pain, not following commands but moving purposefully  CV: s1s2 tachycardic, regular, no m/r/g PULM:  mechanical breath sounds bilaterally on minimal vent support GI: soft, bsx4 active  Extremities: warm/dry, no edema  Skin: no rashes or lesions   Resolved Hospital Problem list     Assessment & Plan:   Acute toxic metabolic encephalopathy, with acute agitation  Polysubstance abuse with suspected acute intoxication vs withdrawal Acute Hypoxic RF in the setting of acute encephalopathy Possible fall-CT scans without evidence of trauma Hx PTSD, Bipolar 1 -suspected acute intoxication. Recent UDS + for amphetamines, BZD, cocaine, Marijuana,  -volatiles pending -sedation weaned and successfully extubated, still intermittently encephalopathic and agitated, prn Ativan ordered -toc consult  -resume home seroquel -add thiamine and MV     AKI  Elevated CK -creatinine improving after IVF -continue to follow renal indices and UOP   Best Practice (right click and "Reselect all SmartList Selections" daily)   Diet/type: NPO DVT prophylaxis: prophylactic heparin  GI prophylaxis: PPI Lines: N/A Foley:  N/A Code Status:  full code Last date of multidisciplinary goals of care discussion [n/a improving extubated]  Labs   CBC: Recent Labs  Lab 02/14/22 2011 02/18/22 1443 02/18/22 1613 02/19/22 0613  WBC 15.4* 12.8*  --  10.7*  NEUTROABS 10.8* 9.9*  --   --   HGB 13.6 11.9* 10.2* 11.0*  HCT  39.9 34.3* 30.0* 31.3*  MCV 77.8* 77.4*  --  75.4*  PLT 462* 403*  --  357     Basic Metabolic Panel: Recent Labs  Lab 02/14/22 2011 02/18/22 1443 02/18/22 1613 02/18/22 1817 02/19/22 0613  NA 138 143 141  --  142  K 5.6* 4.4 4.0  --  4.0  CL 106 108  --   --  110  CO2 25 19*  --   --  24  GLUCOSE 108* 72  --   --  68*  BUN 17 21*  --   --  20   CREATININE 1.41* 1.65*  --   --  1.25*  CALCIUM 9.3 9.3  --   --  8.5*  MG  --   --   --  2.6*  --     GFR: Estimated Creatinine Clearance: 84.4 mL/min (A) (by C-G formula based on SCr of 1.25 mg/dL (H)). Recent Labs  Lab 02/14/22 2011 02/18/22 1443 02/19/22 0613  WBC 15.4* 12.8* 10.7*     Liver Function Tests: Recent Labs  Lab 02/14/22 2011 02/18/22 1443  AST 32 48*  ALT 40 32  ALKPHOS 73 68  BILITOT 0.5 0.5  PROT 8.5* 7.2  ALBUMIN 4.4 3.6    No results for input(s): LIPASE, AMYLASE in the last 168 hours. No results for input(s): AMMONIA in the last 168 hours.  ABG    Component Value Date/Time   PHART 7.341 (L) 02/18/2022 1613   PCO2ART 44.6 02/18/2022 1613   PO2ART 175 (H) 02/18/2022 1613   HCO3 24.2 02/18/2022 1613   TCO2 26 02/18/2022 1613   ACIDBASEDEF 2.0 02/18/2022 1613   O2SAT 99 02/18/2022 1613      Coagulation Profile: No results for input(s): INR, PROTIME in the last 168 hours.  Cardiac Enzymes: Recent Labs  Lab 02/13/22 0613 02/18/22 1443 02/19/22 0613  CKTOTAL 197 811* 1,048*     HbA1C: Hgb A1c MFr Bld  Date/Time Value Ref Range Status  11/21/2021 07:54 PM 5.1 4.8 - 5.6 % Final    Comment:    (NOTE) Pre diabetes:          5.7%-6.4%  Diabetes:              >6.4%  Glycemic control for   <7.0% adults with diabetes   02/22/2019 06:23 AM 5.0 4.8 - 5.6 % Final    Comment:    (NOTE) Pre diabetes:          5.7%-6.4% Diabetes:              >6.4% Glycemic control for   <7.0% adults with diabetes     CBG: Recent Labs  Lab 02/18/22 1711 02/18/22 1952 02/18/22 2313 02/19/22 0313 02/19/22 0715  GLUCAP 83 84 80 72 71     Review of Systems:   Unable to obtain intubated sedated  Past Medical History:  He,  has a past medical history of Bipolar 1 disorder (HCC), Hepatitis C, Hypertension, and PTSD (post-traumatic stress disorder).   Surgical History:   Past Surgical History:  Procedure Laterality Date   NO PAST  SURGERIES       Social History:   reports that he has been smoking cigarettes. He has been smoking an average of .5 packs per day. He has never used smokeless tobacco. He reports current alcohol use. He reports current drug use. Drugs: Marijuana, Heroin, Cocaine, Methamphetamines, and Morphine.   Family History:  His family history includes Other in his  father; Psychiatric Illness in his father.   Allergies Allergies  Allergen Reactions   Tomato Itching and Other (See Comments)    Throat and tongue itches     Home Medications  Prior to Admission medications   Medication Sig Start Date End Date Taking? Authorizing Provider  escitalopram (LEXAPRO) 10 MG tablet Take 1 tablet (10 mg total) by mouth daily. 02/14/22 03/16/22  Comer Locket, MD  gabapentin (NEURONTIN) 100 MG capsule Take 1 capsule (100 mg total) by mouth 3 (three) times daily. 02/14/22 03/16/22  Comer Locket, MD  loratadine (CLARITIN) 10 MG tablet Take 10 mg by mouth daily as needed for allergies.    [provider]  nicotine polacrilex (NICORETTE) 2 MG gum Take 1 each (2 mg total) by mouth as needed for smoking cessation. 02/14/22 03/16/22  Comer Locket, MD  pantoprazole (PROTONIX) 40 MG tablet Take 1 tablet (40 mg total) by mouth daily. 01/23/22   Karsten Ro, MD  QUEtiapine (SEROQUEL) 100 MG tablet Take 1 tablet (100 mg total) by mouth at bedtime. 02/14/22 03/16/22  Comer Locket, MD     Critical care time:  38 minutes    CRITICAL CARE Performed by: Darcella Gasman Remmy Riffe   Total critical care time: 38 minutes  Critical care time was exclusive of separately billable procedures and treating other patients.  Critical care was necessary to treat or prevent imminent or life-threatening deterioration.  Critical care was time spent personally by me on the following activities: development of treatment plan with patient and/or surrogate as well as nursing, discussions with consultants, evaluation of patient's  response to treatment, examination of patient, obtaining history from patient or surrogate, ordering and performing treatments and interventions, ordering and review of laboratory studies, ordering and review of radiographic studies, pulse oximetry and re-evaluation of patient's condition.  Darcella Gasman Shaguana Love, PA-C [facility] See Amion for pager If no response to pager , please call [phone] until 7pm After 7:00 pm call Elink  [phone]

## 2022-02-19 NOTE — Progress Notes (Unsigned)
Letter for patient's mother.

## 2022-02-19 NOTE — Progress Notes (Signed)
eLink Physician-Brief Progress Note Patient Name: Tristan [name] DOB: 15-Mar-1986 MRN: [id number]   Date of Service  02/19/2022  HPI/Events of Note  Urinary retention - Patient has been I/O cathed X 2. Will order urinary retention medication to attempt to avoid Foley Catheter placement.  eICU Interventions  Plan: Cardura 2 mg per tube now.     Intervention Category Major Interventions: Other:  Lilah Mijangos Dennard Nip 02/19/2022, 2:25 AM

## 2022-02-19 NOTE — Progress Notes (Signed)
Hypoglycemic Event  CBG: 62  Treatment: D50 25 mL (12.5 gm)  Symptoms: None  Follow-up CBG: Time:1159 CBG Result:107  Possible Reasons for Event: Inadequate meal intake  Comments/MD notified:Dr. Byrum aware.    Tristan [name]

## 2022-02-20 DIAGNOSIS — N179 Acute kidney failure, unspecified: Secondary | ICD-10-CM | POA: Diagnosis not present

## 2022-02-20 DIAGNOSIS — F3160 Bipolar disorder, current episode mixed, unspecified: Secondary | ICD-10-CM | POA: Diagnosis not present

## 2022-02-20 DIAGNOSIS — I10 Essential (primary) hypertension: Secondary | ICD-10-CM

## 2022-02-20 DIAGNOSIS — F1910 Other psychoactive substance abuse, uncomplicated: Secondary | ICD-10-CM

## 2022-02-20 DIAGNOSIS — F322 Major depressive disorder, single episode, severe without psychotic features: Secondary | ICD-10-CM

## 2022-02-20 DIAGNOSIS — G9341 Metabolic encephalopathy: Secondary | ICD-10-CM | POA: Diagnosis not present

## 2022-02-20 LAB — CBC
HCT: 29 % — ABNORMAL LOW (ref 39.0–52.0)
Hemoglobin: 10.2 g/dL — ABNORMAL LOW (ref 13.0–17.0)
MCH: 26.5 pg (ref 26.0–34.0)
MCHC: 35.2 g/dL (ref 30.0–36.0)
MCV: 75.3 fL — ABNORMAL LOW (ref 80.0–100.0)
Platelets: 334 10*3/uL (ref 150–400)
RBC: 3.85 MIL/uL — ABNORMAL LOW (ref 4.22–5.81)
RDW: 15.4 % (ref 11.5–15.5)
WBC: 13.2 10*3/uL — ABNORMAL HIGH (ref 4.0–10.5)
nRBC: 0 % (ref 0.0–0.2)

## 2022-02-20 LAB — BASIC METABOLIC PANEL
Anion gap: 7 (ref 5–15)
BUN: 15 mg/dL (ref 6–20)
CO2: 23 mmol/L (ref 22–32)
Calcium: 8.1 mg/dL — ABNORMAL LOW (ref 8.9–10.3)
Chloride: 103 mmol/L (ref 98–111)
Creatinine, Ser: 1 mg/dL (ref 0.61–1.24)
GFR, Estimated: >60 mL/min (ref >60)
Glucose, Bld: 99 mg/dL (ref 70–99)
Potassium: 3.5 mmol/L (ref 3.5–5.1)
Sodium: 133 mmol/L — ABNORMAL LOW (ref 135–145)

## 2022-02-20 LAB — GLUCOSE, CAPILLARY
Glucose-Capillary: 121 mg/dL — ABNORMAL HIGH (ref 70–99)
Glucose-Capillary: 102 mg/dL — ABNORMAL HIGH (ref 70–99)
Glucose-Capillary: 137 mg/dL — ABNORMAL HIGH (ref 70–99)
Glucose-Capillary: 119 mg/dL — ABNORMAL HIGH (ref 70–99)
Glucose-Capillary: 106 mg/dL — ABNORMAL HIGH (ref 70–99)

## 2022-02-20 NOTE — Progress Notes (Signed)
Pt has attempted to get out of bed several times this shift despite education given on falls. Nursing staff, this RN, and other nurse techs have educated the patient to use the call bell when needing to get up to use the bathroom. Pt has refused to listen to staff and continues to get out of bed. Fall mats in place on both sides of the bed. Call bell within reach. Bed alarm on and working. Room near nurses station.

## 2022-02-20 NOTE — Progress Notes (Signed)
PROGRESS NOTE    Tristan [name]  DZH:[id number] DOB: [birth year] DOA: 02/18/2022 PCP: Pcp, No    Brief Narrative:  Patient is a 36 years old male with past medical history polysubstance abuse, bipolar disorder, PTSD, hepatitis C was brought into the hospital with confusion and agitation.  He was noted to be delirious with encephalopathy and was sedated and intubated for airway protection and control of agitation.  Urine drug screen was positive for amphetamines benzodiazepines cocaine and marijuana.  Patient was then admitted to the ICU.  Subsequently patient was extubated and was considered stable for transfer out of ICU.  Assessment and plan Principal Problem:   Acute metabolic encephalopathy Active Problems:   Bipolar 1 disorder, mixed (HCC)   MDD (major depressive disorder), severe (HCC)   Polysubstance abuse (HCC)   AKI (acute kidney injury) (HCC)   Psychosis (HCC)   Hypertension   Acute toxic metabolic encephalopathy, with acute agitation  Polysubstance abuse with suspected acute intoxication vs withdrawal Possibility of fall.  CT scans were done without any history of trauma.  Patient does have a history of PTSD and bipolar in the past.  Urine drug screen was positive for amphetamines, benzodiazepines, cocaine and marijuana.  Patient was intubated and extubated.  Seroquel has been initiated.  Continue thiamine multivitamin on Ativan as needed agitation.  History of PTSD, bipolar disorder.  Continue Seroquel.  We will consult psychiatry for further assessment.  Did have a prolonged hospital stay recently and was discharged by psychiatry on 02/14/2022.  Will need to restart Lexapro and Neurontin when okay.  Acute hypoxic respiratory failure in the setting of acute encephalopathy Has been extubated at this time.  We will continue to monitor closely.  Acute kidney injury. Creatinine today at 1.2.  Received IV fluids.  Mild rhabdomyolysis. CK elevated at 1048.  Received  IV fluids .  We will increase oral fluid intake.  Upper lip swelling.  No history of medication allergies.  No wheezing stridor or tongue swelling.  We will continue to monitor.  Could be traumatic.  We will continue to monitor.      DVT prophylaxis: heparin injection 5,000 Units Start: 02/18/22 1545 SCDs Start: 02/18/22 1542   Code Status:     Code Status: Full Code  Disposition: Home  Status is: Inpatient  Remains inpatient appropriate because: Metabolic encephalopathy, psych follow-up   Family Communication: Communicated with the patient at bedside  Consultants:  PCCM Psychiatry  Procedures:  Intubation and mechanical ventilation-extubated  Antimicrobials:  None  Anti-infectives (From admission, onward)    None      Subjective: Today, patient was seen and examined at bedside.  Complains of pain all over the body.  Feels anxious.  Nursing staff reported swelling of the upper lip today.  No shortness of breath wheezing  Objective: Vitals:   02/20/22 0600 02/20/22 0706 02/20/22 0800 02/20/22 1133  BP: (!) 104/55  133/72   Pulse: 61  79   Resp: 20  (!) 21   Temp:  99.4 F (37.4 C)  98.3 F (36.8 C)  TempSrc:  Oral  Oral  SpO2: 92%  98%   Weight:      Height:        Intake/Output Summary (Last 24 hours) at 02/20/2022 1416 Last data filed at 02/20/2022 0800 Gross per 24 hour  Intake 240 ml  Output 2400 ml  Net -2160 ml   Filed Weights   02/18/22 1432 02/19/22 0458  Weight: 85 kg 84.2 kg  Physical Examination: Body mass index is 26.63 kg/m.  General:  Average built, not in obvious distress HENT:   No scleral pallor or icterus noted. Oral mucosa is moist.  Gross swelling of the upper lip. Chest:  Clear breath sounds.  Diminished breath sounds bilaterally. No crackles or wheezes.  CVS: S1 &S2 heard. No murmur.  Regular rate and rhythm. Abdomen: Soft, nontender, nondistended.  Bowel sounds are heard.   Extremities: No cyanosis, clubbing or edema.   Peripheral pulses are palpable. Psych: Alert, awake and oriented, mildly anxious and restless. CNS:  No cranial nerve deficits.  Moves all the extremities  Skin: Warm and dry.  No rashes noted.  Data Reviewed:   CBC: Recent Labs  Lab 02/14/22 2011 02/18/22 1443 02/18/22 1613 02/19/22 0613 02/20/22 0142  WBC 15.4* 12.8*  --  10.7* 13.2*  NEUTROABS 10.8* 9.9*  --   --   --   HGB 13.6 11.9* 10.2* 11.0* 10.2*  HCT 39.9 34.3* 30.0* 31.3* 29.0*  MCV 77.8* 77.4*  --  75.4* 75.3*  PLT 462* 403*  --  357 334    Basic Metabolic Panel: Recent Labs  Lab 02/14/22 2011 02/18/22 1443 02/18/22 1613 02/18/22 1817 02/19/22 0613 02/20/22 0142  NA 138 143 141  --  142 133*  K 5.6* 4.4 4.0  --  4.0 3.5  CL 106 108  --   --  110 103  CO2 25 19*  --   --  24 23  GLUCOSE 108* 72  --   --  68* 99  BUN 17 21*  --   --  20 15  CREATININE 1.41* 1.65*  --   --  1.25* 1.00  CALCIUM 9.3 9.3  --   --  8.5* 8.1*  MG  --   --   --  2.6*  --   --     Liver Function Tests: Recent Labs  Lab 02/14/22 2011 02/18/22 1443  AST 32 48*  ALT 40 32  ALKPHOS 73 68  BILITOT 0.5 0.5  PROT 8.5* 7.2  ALBUMIN 4.4 3.6     Radiology Studies: CT Head Wo Contrast  Result Date: 02/18/2022 CLINICAL DATA:  Found down, combative EXAM: CT HEAD WITHOUT CONTRAST CT CERVICAL SPINE WITHOUT CONTRAST TECHNIQUE: Multidetector CT imaging of the head and cervical spine was performed following the standard protocol without intravenous contrast. Multiplanar CT image reconstructions of the cervical spine were also generated. RADIATION DOSE REDUCTION: This exam was performed according to the departmental dose-optimization program which includes automated exposure control, adjustment of the mA and/or kV according to patient size and/or use of iterative reconstruction technique. COMPARISON:  02/05/2022 FINDINGS: CT HEAD FINDINGS Brain: No acute infarct or hemorrhage. Lateral ventricles and midline structures are unremarkable. No  acute extra-axial fluid collections. No mass effect. Vascular: No hyperdense vessel or unexpected calcification. Skull: Normal. Negative for fracture or focal lesion. Sinuses/Orbits: No acute finding. Other: None. CT CERVICAL SPINE FINDINGS Alignment: Alignment is anatomic. Skull base and vertebrae: No acute fracture. No primary bone lesion or focal pathologic process. Soft tissues and spinal canal: No prevertebral fluid or swelling. No visible canal hematoma. Disc levels:  No significant spondylosis or facet hypertrophy. Upper chest: Patient is intubated. Distal margins of the endotracheal tube and enteric catheter are excluded by slice selection. Mild emphysema at the lung apices. Other: Reconstructed images demonstrate no additional findings. IMPRESSION: 1. No acute intracranial process. 2. No acute cervical spine fracture. Electronically Signed   By: Casimiro Needle  Manson PasseyBrown M.D.   On: 02/18/2022 17:18   CT Cervical Spine Wo Contrast  Result Date: 02/18/2022 CLINICAL DATA:  Found down, combative EXAM: CT HEAD WITHOUT CONTRAST CT CERVICAL SPINE WITHOUT CONTRAST TECHNIQUE: Multidetector CT imaging of the head and cervical spine was performed following the standard protocol without intravenous contrast. Multiplanar CT image reconstructions of the cervical spine were also generated. RADIATION DOSE REDUCTION: This exam was performed according to the departmental dose-optimization program which includes automated exposure control, adjustment of the mA and/or kV according to patient size and/or use of iterative reconstruction technique. COMPARISON:  02/05/2022 FINDINGS: CT HEAD FINDINGS Brain: No acute infarct or hemorrhage. Lateral ventricles and midline structures are unremarkable. No acute extra-axial fluid collections. No mass effect. Vascular: No hyperdense vessel or unexpected calcification. Skull: Normal. Negative for fracture or focal lesion. Sinuses/Orbits: No acute finding. Other: None. CT CERVICAL SPINE FINDINGS  Alignment: Alignment is anatomic. Skull base and vertebrae: No acute fracture. No primary bone lesion or focal pathologic process. Soft tissues and spinal canal: No prevertebral fluid or swelling. No visible canal hematoma. Disc levels:  No significant spondylosis or facet hypertrophy. Upper chest: Patient is intubated. Distal margins of the endotracheal tube and enteric catheter are excluded by slice selection. Mild emphysema at the lung apices. Other: Reconstructed images demonstrate no additional findings. IMPRESSION: 1. No acute intracranial process. 2. No acute cervical spine fracture. Electronically Signed   By: Sharlet SalinaMichael  Brown M.D.   On: 02/18/2022 17:18   DG Chest Port 1 View  Result Date: 02/19/2022 CLINICAL DATA:  Ventilator dependent respiratory failure. EXAM: PORTABLE CHEST 1 VIEW COMPARISON:  Chest CT yesterday at 4:48 p.m. and portable chest yesterday at 4:20 p.m. FINDINGS: 4:51 a.m. 02/19/2022.  ETT tip is 3.7 cm over the carina. NGT enters the stomach with proximal side hole just below the hiatus and could be advanced further in 10 cm for better positioning. The cardiac size is normal. The mediastinal configuration is unremarkable. The lungs are hyperinflated. There is increasing opacity in the medial right base consistent with atelectasis or consolidation. There is increased left infrahilar streaky opacity which could be atelectasis or infiltrates. The remaining lungs are clear. There are trace pleural effusions. In all other respects no further changes. IMPRESSION: 1. Increased opacity medial right base consistent with atelectasis or consolidation. 2. Increased streaky opacity in the left infrahilar area consistent with atelectasis or infiltrates. 3. Support tubes as above. Electronically Signed   By: Almira BarKeith  Chesser M.D.   On: 02/19/2022 07:15   DG Chest Portable 1 View  Result Date: 02/18/2022 CLINICAL DATA:  Intubation. EXAM: PORTABLE CHEST 1 VIEW COMPARISON:  AP chest 02/18/2022 at 1449  hours. FINDINGS: New endotracheal tube tip terminates approximately 4 cm above the carina in between the thoracic inlet and the carina. Enteric tube descends below the diaphragm with the side port overlying the left upper abdominal quadrant in the tip excluded by collimation. Cardiac silhouette and mediastinal contours are within normal limits. The lungs are clear. No pleural effusion or pneumothorax. IMPRESSION: 1. New endotracheal tube and enteric tube in appropriate position. 2. No acute lung process. Electronically Signed   By: Neita Garnetonald  Viola M.D.   On: 02/18/2022 16:53   DG Chest Port 1 View  Result Date: 02/18/2022 CLINICAL DATA:  Patient found in the street rolling around, acting belligerent. Combative. EXAM: PORTABLE CHEST 1 VIEW COMPARISON:  02/14/2022 FINDINGS: Cardiac silhouette is normal in size and configuration. Normal mediastinal and hilar contours. Clear lungs.  No pleural effusion  or pneumothorax. Skeletal structures are grossly intact. IMPRESSION: No active disease. Electronically Signed   By: Amie Portland M.D.   On: 02/18/2022 15:24   CT CHEST ABDOMEN PELVIS WO CONTRAST  Result Date: 02/18/2022 CLINICAL DATA:  Blunt chest trauma. Patient found lying in the street. Combative. Had to be intubated. EXAM: CT CHEST, ABDOMEN AND PELVIS WITHOUT CONTRAST TECHNIQUE: Multidetector CT imaging of the chest, abdomen and pelvis was performed following the standard protocol without IV contrast. RADIATION DOSE REDUCTION: This exam was performed according to the departmental dose-optimization program which includes automated exposure control, adjustment of the mA and/or kV according to patient size and/or use of iterative reconstruction technique. COMPARISON:  AP chest 02/18/2022, CT abdomen and pelvis without contrast 01/27/2022 FINDINGS: CT CHEST FINDINGS Cardiovascular: Heart size is normal. No pericardial effusion. No thoracic aortic aneurysm. Mediastinum/Nodes: No axillary, mediastinal, or hilar  pathologically enlarged lymph nodes by CT criteria. The visualized thyroid is grossly unremarkable. An enteric tube is seen throughout the esophagus extending into the stomach with the tip overlying the mid stomach. Lungs/Pleura: Endotracheal tube is seen within the trachea with the tip terminating approximately 3.0 cm above the carina. There are mild curvilinear bubbly lucencies around the endotracheal tube within the trachea. There is opacification of the posterior segment of the right lower lobe airway (axial series 5, images 107-113, possibly from mucous plugging/secretion) otherwise, the central airways are patent. Mild bilateral lower lobe dependent subsegmental atelectasis. It is difficult to exclude trace pleural effusions. Mild biapical paraseptal cystic emphysematous changes. No focal airspace opacity to indicate pneumonia. No pneumothorax. Musculoskeletal: Normal thoracic spine alignment. Vertebral body heights and intervertebral disc spaces are maintained. No acute fracture is seen. CT ABDOMEN PELVIS FINDINGS Lack of intra-articular fluid limits evaluation of the abdominal and pelvic organ parenchyma. The following findings are made within this limitation. Hepatobiliary: Smooth liver contours. No gross liver lesion is seen. The gallbladder is grossly unremarkable. Pancreas: Grossly unremarkable. No gross pancreatic ductal dilatation or surrounding inflammatory changes. Spleen: Normal in size without focal abnormality. Unchanged small splenule in between the spleen and upper pole of the left kidney (axial image 64 and coronal image 67). Adrenals/Urinary Tract: Normal adrenals. No renal stone or hydronephrosis. Within the limitations of lack of IV contrast, no contour deforming renal mass. The urinary bladder is grossly unremarkable. Stomach/Bowel: No bowel wall thickening. The terminal ileum is unremarkable. The appendix is within normal limits. No dilated loops of bowel to indicate bowel obstruction.  Vascular/Lymphatic: No abdominal aortic aneurysm. No enlarged abdominal or pelvic lymph nodes. Reproductive: Prostate is unremarkable. Other: No abdominal wall hernia. No free air or free fluid. Musculoskeletal: Normal 2 lumbar spine alignment. No acute fracture is seen. IMPRESSION: 1. No noncontrast CT evidence of posttraumatic abnormality within the chest, abdomen, or pelvis. 2. Endotracheal tube and enteric tube in appropriate position. 3. Mild bubbly secretions within the trachea and likely secretions narrowing the posterior segment of the right lower lobe bronchus. Mild bilateral posterior dependent subsegmental atelectasis. It is difficult to exclude tiny pleural effusions. Electronically Signed   By: Neita Garnet M.D.   On: 02/18/2022 17:31      LOS: 2 days    Joycelyn Das, MD [facility] Available via Epic secure chat 7am-7pm After these hours, please refer to coverage provider listed on [email or web address] 02/20/2022, 2:16 PM

## 2022-02-20 NOTE — Consult Note (Signed)
[place] [facility] Consult   Reason for Consult: Agitation needing intubation, recent prolonged inpatient hospital stay, bipolar disorder/PTSD Referring Physician: Dr. Louanne Belton Patient Identification: Tristan [name] MRN:  [id number] Principal Diagnosis: Acute metabolic encephalopathy Diagnosis:  Principal Problem:   Acute metabolic encephalopathy Active Problems:   Bipolar 1 disorder, mixed ([place])   MDD (major depressive disorder), severe ([place])   Polysubstance abuse ([place])   AKI (acute kidney injury) ([place])   Psychosis ([place])   Hypertension   Total Time spent with patient: 30 minutes  Subjective:   Tristan [name] is a 36 y.o. male patient admitted with polysubstance abuse, suspected overdose, and overt psychosis.  Patient is very well known to behavioral health service line as he has approximately 25+ emergency room visits in the past 6 months, multiple inpatient hospitalizations 3 this year of 2023.  Patient with failed attempts at inpatient psychiatric hospitalization, inpatient substance abuse rehabilitation.  Patient has past medical history of bipolar 1 disorder, PTSD, polysubstance abuse addiction, new diagnosis of hepatitis C secondary to IV drug use.  Last discharge from [facility] after a 3-day length of stay on May 25.  Immediately following discharge on 5/25, patient was brought into the emergency room after being found unresponsive in the bathroom of [facility] with suspected heroin use.  Patient refused Narcan.   On today's evaluation, the patient is very irritable and grudgingly cooperative for the evaluation.  He contributes his lack of participation due to mouth pain.  He does have obvious swelling of the upper lip, unable to determine/recall what took place.  He is also very vague with no recollection as to what brought him into the hospital, despite multiple prompts and statements from nursing staff and chart.  Patient is unable  to recall if he used any illicit substances prior to this admission, although he does not deny this.  Urine drug screen was positive for amphetamines, benzodiazepines, cocaine, and marijuana.  Patient acknowledged recently being discharged from [facility] on 5/25, to [facility] [place].  He is unclear if he can return to this facility.  He denies any urges, withdrawal symptoms, and or cravings for any substances.  He denies any use of psychoactive substances to include synthetic marijuana, mushrooms, and or other hallucinogenic's.  He denies any acute psychiatric symptoms.  Upon further questioning based off chart review patient has been responding to internal stimuli and talking to self.  In which she denies adamantly at this time.  He is unable to tell me the last time he had hallucinations and or felt paranoid.  Patient is able to deny any imminent safety concerns to include suicidal ideations, recent suicide attempt, and or self-harm behaviors.  He further denies any homicidal ideations, violent behavior prior to terminating the interview by turning on his side and closing his eyes " I am done talking."   HPI:  Patient is a 36 years old male with past medical history polysubstance abuse, bipolar disorder, PTSD, hepatitis C was brought into the hospital with confusion and agitation.  He was noted to be delirious with encephalopathy and was sedated and intubated for airway protection and control of agitation.  Urine drug screen was positive for amphetamines benzodiazepines cocaine and marijuana.  Patient was then admitted to the ICU.  Subsequently patient was extubated and was considered stable for transfer out of ICU.  Past Psychiatric History:  Previous Psychiatric Diagnoses: Depression, Bipolar I, substance induce mood d/o, polysubstance abuse, PTSD Current Mental Health  Providers: None per patient report - did not keep follow up appointments (unsure of previous mental health  providers) Previous Psychological Evaluations: Yes  Past Psychiatric Hospitalizations: Unknown number per patient report; per EHR was admitted to [facility] 02/11/22, 01/16/22, 09/28/2019, 02/20/2019, 01/27/2019, and 05/02/2014 and at [facility] in 2020 Past Suicide Attempts: He admits to previous suicide attempts with last attempt a month ago by intentional OD; Per EHR he previously reported suicide attempts by laying under a bus and holding a gun thinking about shooting himself at ages 84, 61 Past History of Homicidal Behaviors / Violent or Aggressive Behaviors: Denied History of Self-Mutilation: Uncooperative to answer; per EHR previously denied Previous Participation in PHP/IOP or Residential Programs: Uncooperative to answer Past History of ECT / [place] / VNS: Uncooperative to answer Past Psychotropic Medication Trials: Patient unsure; per EHR previously was on Seroquel, Lexapro, Neurontin, and Prozac, Abilify, Celexa, Remeron    Past Medical History:  Past Medical History:  Diagnosis Date   Bipolar 1 disorder ([place])    Hepatitis C    Hypertension    PTSD (post-traumatic stress disorder)     Past Surgical History:  Procedure Laterality Date   NO PAST SURGERIES     Family History:  Family History  Problem Relation Age of Onset   Other Father    Psychiatric Illness Father    Family Psychiatric  History:  Patient states he does not know about addiction, suicides, or mental health issues in his family; Per EHR he previously reported that both parents have some mental issues, an Uncle completed suicide and that there is Substance abuse in both side of the family. Social History:  Social History   Substance and Sexual Activity  Alcohol Use Yes   Comment: Pint per day     Social History   Substance and Sexual Activity  Drug Use Yes   Types: Marijuana, Heroin, Cocaine, Methamphetamines, Morphine   Comment: denies drug use    Social History   Socioeconomic History   Marital status: Single     Spouse name: Not on file   Number of children: Not on file   Years of education: Not on file   Highest education level: Not on file  Occupational History   Not on file  Tobacco Use   Smoking status: Every Day    Packs/day: 0.50    Types: Cigarettes   Smokeless tobacco: Never  Vaping Use   Vaping Use: Never used  Substance and Sexual Activity   Alcohol use: Yes    Comment: Pint per day   Drug use: Yes    Types: Marijuana, Heroin, Cocaine, Methamphetamines, Morphine    Comment: denies drug use   Sexual activity: Not Currently  Other Topics Concern   Not on file  Social History Narrative   ** Merged History Encounter **       Social Determinants of Health   Financial Resource Strain: Not on file  Food Insecurity: Not on file  Transportation Needs: Not on file  Physical Activity: Not on file  Stress: Not on file  Social Connections: Not on file   Additional Social History:    Allergies:   Allergies  Allergen Reactions   Tomato Itching and Other (See Comments)    Throat and tongue itches    Labs:  Results for orders placed or performed during the hospital encounter of 02/18/22 (from the past 48 hour(s))  I-Stat arterial blood gas, ED [facility] ED only)     Status: Abnormal  Collection Time: 02/18/22  4:13 PM  Result Value Ref Range   pH, Arterial 7.341 (L) 7.35 - 7.45   pCO2 arterial 44.6 32 - 48 mmHg   pO2, Arterial 175 (H) 83 - 108 mmHg   Bicarbonate 24.2 20.0 - 28.0 mmol/L   TCO2 26 22 - 32 mmol/L   O2 Saturation 99 %   Acid-base deficit 2.0 0.0 - 2.0 mmol/L   Sodium 141 135 - 145 mmol/L   Potassium 4.0 3.5 - 5.1 mmol/L   Calcium, Ion 1.21 1.15 - 1.40 mmol/L   HCT 30.0 (L) 39.0 - 52.0 %   Hemoglobin 10.2 (L) 13.0 - 17.0 g/dL   Patient temperature 97.8 F    Collection site RADIAL, ALLEN'S TEST ACCEPTABLE    Drawn by RT    Sample type ARTERIAL   Glucose, capillary     Status: None   Collection Time: 02/18/22  5:11 PM  Result Value Ref Range    Glucose-Capillary 83 70 - 99 mg/dL    Comment: Glucose reference range applies only to samples taken after fasting for at least 8 hours.  Volatiles,Blood (acetone,ethanol,isoprop,methanol)     Status: None   Collection Time: 02/18/22  6:17 PM  Result Value Ref Range   Acetone, blood <0.010 0.000 - 0.010 g/dL    Comment: (NOTE)                                Detection Limit = 0.010 Called to [facility] @ 9:50pm  02/18/22  Dee L Performed At: [facility] [place], [place] [id number] Rush Farmer MD [id number]    Ethanol, blood <0.010 0.000 - 0.010 g/dL    Comment: (NOTE)                                Detection Limit = 0.010 This test was developed and its performance characteristics determined by [facility]. It has not been cleared or approved by the Food and Drug Administration.    Isopropanol, blood <0.010 0.000 - 0.010 g/dL    Comment:                                 Detection Limit = 0.010   Methanol, blood <0.010 0.000 - 0.010 g/dL    Comment:                                 Detection Limit = 0.010  Magnesium     Status: Abnormal   Collection Time: 02/18/22  6:17 PM  Result Value Ref Range   Magnesium 2.6 (H) 1.7 - 2.4 mg/dL    Comment: Performed at [facility] [address] [place], [place] [address]  MRSA Next Gen by PCR, Nasal     Status: None   Collection Time: 02/18/22  6:30 PM   Specimen: Nasal Mucosa; Nasal Swab  Result Value Ref Range   MRSA by PCR Next Gen NOT DETECTED NOT DETECTED    Comment: (NOTE) The GeneXpert MRSA Assay (FDA approved for NASAL specimens only), is one component of a comprehensive MRSA colonization surveillance program. It is not intended to diagnose MRSA infection nor to guide or monitor treatment for MRSA infections. Test performance is not FDA approved  in patients less than 73 years old. Performed at [facility], [place] [address], [place], [place] [address]   Glucose, capillary     Status: None    Collection Time: 02/18/22  7:52 PM  Result Value Ref Range   Glucose-Capillary 84 70 - 99 mg/dL    Comment: Glucose reference range applies only to samples taken after fasting for at least 8 hours.  Urine rapid drug screen (hosp performed)     Status: Abnormal   Collection Time: 02/18/22  8:16 PM  Result Value Ref Range   Opiates NONE DETECTED NONE DETECTED   Cocaine POSITIVE (A) NONE DETECTED   Benzodiazepines POSITIVE (A) NONE DETECTED   Amphetamines POSITIVE (A) NONE DETECTED   Tetrahydrocannabinol POSITIVE (A) NONE DETECTED   Barbiturates NONE DETECTED NONE DETECTED    Comment: (NOTE) DRUG SCREEN FOR MEDICAL PURPOSES ONLY.  IF CONFIRMATION IS NEEDED FOR ANY PURPOSE, NOTIFY LAB WITHIN 5 DAYS.  LOWEST DETECTABLE LIMITS FOR URINE DRUG SCREEN Drug Class                     Cutoff (ng/mL) Amphetamine and metabolites    1000 Barbiturate and metabolites    200 Benzodiazepine                 440 Tricyclics and metabolites     300 Opiates and metabolites        300 Cocaine and metabolites        300 THC                            50 Performed at [facility], [place] [address], [place], [place] [address]   Glucose, capillary     Status: None   Collection Time: 02/18/22 11:13 PM  Result Value Ref Range   Glucose-Capillary 80 70 - 99 mg/dL    Comment: Glucose reference range applies only to samples taken after fasting for at least 8 hours.  Glucose, capillary     Status: None   Collection Time: 02/19/22  3:13 AM  Result Value Ref Range   Glucose-Capillary 72 70 - 99 mg/dL    Comment: Glucose reference range applies only to samples taken after fasting for at least 8 hours.  Triglycerides     Status: None   Collection Time: 02/19/22  6:13 AM  Result Value Ref Range   Triglycerides 32 <150 mg/dL    Comment: Performed at [facility] [address], [place], [place] [address]  CBC     Status: Abnormal   Collection Time: 02/19/22  6:13 AM  Result Value Ref Range    WBC 10.7 (H) 4.0 - 10.5 K/uL   RBC 4.15 (L) 4.22 - 5.81 MIL/uL   Hemoglobin 11.0 (L) 13.0 - 17.0 g/dL   HCT 31.3 (L) 39.0 - 52.0 %   MCV 75.4 (L) 80.0 - 100.0 fL   MCH 26.5 26.0 - 34.0 pg   MCHC 35.1 30.0 - 36.0 g/dL   RDW 15.5 11.5 - 15.5 %   Platelets 357 150 - 400 K/uL   nRBC 0.0 0.0 - 0.2 %    Comment: Performed at [facility], [place] [address], [place], [place] [address]  Basic metabolic panel     Status: Abnormal   Collection Time: 02/19/22  6:13 AM  Result Value Ref Range   Sodium 142 135 - 145 mmol/L   Potassium 4.0  3.5 - 5.1 mmol/L   Chloride 110 98 - 111 mmol/L   CO2 24 22 - 32 mmol/L   Glucose, Bld 68 (L) 70 - 99 mg/dL    Comment: Glucose reference range applies only to samples taken after fasting for at least 8 hours.   BUN 20 6 - 20 mg/dL   Creatinine, Ser 1.25 (H) 0.61 - 1.24 mg/dL   Calcium 8.5 (L) 8.9 - 10.3 mg/dL   GFR, Estimated >60 >60 mL/min    Comment: (NOTE) Calculated using the CKD-EPI Creatinine Equation (2021)    Anion gap 8 5 - 15    Comment: Performed at [facility] [address], [place], [place] [address]  CK     Status: Abnormal   Collection Time: 02/19/22  6:13 AM  Result Value Ref Range   Total CK 1,048 (H) 49 - 397 U/L    Comment: Performed at [facility], [place] [address] [place], [place] [address]  Glucose, capillary     Status: None   Collection Time: 02/19/22  7:15 AM  Result Value Ref Range   Glucose-Capillary 71 70 - 99 mg/dL    Comment: Glucose reference range applies only to samples taken after fasting for at least 8 hours.  Glucose, capillary     Status: Abnormal   Collection Time: 02/19/22 11:23 AM  Result Value Ref Range   Glucose-Capillary 66 (L) 70 - 99 mg/dL    Comment: Glucose reference range applies only to samples taken after fasting for at least 8 hours.  Glucose, capillary     Status: Abnormal   Collection Time: 02/19/22 11:59 AM  Result Value Ref Range   Glucose-Capillary 107 (H) 70 - 99 mg/dL     Comment: Glucose reference range applies only to samples taken after fasting for at least 8 hours.  Glucose, capillary     Status: Abnormal   Collection Time: 02/19/22  3:13 PM  Result Value Ref Range   Glucose-Capillary 61 (L) 70 - 99 mg/dL    Comment: Glucose reference range applies only to samples taken after fasting for at least 8 hours.  Glucose, capillary     Status: None   Collection Time: 02/19/22  3:46 PM  Result Value Ref Range   Glucose-Capillary 90 70 - 99 mg/dL    Comment: Glucose reference range applies only to samples taken after fasting for at least 8 hours.  Glucose, capillary     Status: Abnormal   Collection Time: 02/19/22  7:06 PM  Result Value Ref Range   Glucose-Capillary 143 (H) 70 - 99 mg/dL    Comment: Glucose reference range applies only to samples taken after fasting for at least 8 hours.  Glucose, capillary     Status: Abnormal   Collection Time: 02/19/22 11:13 PM  Result Value Ref Range   Glucose-Capillary 115 (H) 70 - 99 mg/dL    Comment: Glucose reference range applies only to samples taken after fasting for at least 8 hours.  CBC     Status: Abnormal   Collection Time: 02/20/22  1:42 AM  Result Value Ref Range   WBC 13.2 (H) 4.0 - 10.5 K/uL   RBC 3.85 (L) 4.22 - 5.81 MIL/uL   Hemoglobin 10.2 (L) 13.0 - 17.0 g/dL   HCT 29.0 (L) 39.0 - 52.0 %   MCV 75.3 (L) 80.0 - 100.0 fL   MCH 26.5 26.0 - 34.0 pg   MCHC 35.2 30.0 - 36.0 g/dL   RDW  15.4 11.5 - 15.5 %   Platelets 334 150 - 400 K/uL   nRBC 0.0 0.0 - 0.2 %    Comment: Performed at [facility], [place] [address], [place], [place] [address]  Basic metabolic panel     Status: Abnormal   Collection Time: 02/20/22  1:42 AM  Result Value Ref Range   Sodium 133 (L) 135 - 145 mmol/L    Comment: DELTA CHECK NOTED   Potassium 3.5 3.5 - 5.1 mmol/L   Chloride 103 98 - 111 mmol/L   CO2 23 22 - 32 mmol/L   Glucose, Bld 99 70 - 99 mg/dL    Comment: Glucose reference range applies only to samples taken  after fasting for at least 8 hours.   BUN 15 6 - 20 mg/dL   Creatinine, Ser 1.00 0.61 - 1.24 mg/dL   Calcium 8.1 (L) 8.9 - 10.3 mg/dL   GFR, Estimated >60 >60 mL/min    Comment: (NOTE) Calculated using the CKD-EPI Creatinine Equation (2021)    Anion gap 7 5 - 15    Comment: Performed at [facility] [address], [place], [place] [address]  Glucose, capillary     Status: Abnormal   Collection Time: 02/20/22  3:07 AM  Result Value Ref Range   Glucose-Capillary 121 (H) 70 - 99 mg/dL    Comment: Glucose reference range applies only to samples taken after fasting for at least 8 hours.  Glucose, capillary     Status: Abnormal   Collection Time: 02/20/22  7:05 AM  Result Value Ref Range   Glucose-Capillary 102 (H) 70 - 99 mg/dL    Comment: Glucose reference range applies only to samples taken after fasting for at least 8 hours.  Glucose, capillary     Status: Abnormal   Collection Time: 02/20/22 11:32 AM  Result Value Ref Range   Glucose-Capillary 137 (H) 70 - 99 mg/dL    Comment: Glucose reference range applies only to samples taken after fasting for at least 8 hours.    Current Facility-Administered Medications  Medication Dose Route Frequency Provider Last Rate Last Admin   acetaminophen (TYLENOL) tablet 650 mg  650 mg Oral Q4H PRN Henri Medal, RPH   650 mg at 02/19/22 2022   chlorhexidine gluconate (MEDLINE KIT) (PERIDEX) 0.12 % solution 15 mL  15 mL Mouth Rinse BID Cristal Generous, NP   15 mL at 02/20/22 0730   Chlorhexidine Gluconate Cloth 2 % PADS 6 each  6 each Topical Q2000 Icard, Bradley L, DO   6 each at 02/19/22 0000   docusate sodium (COLACE) capsule 100 mg  100 mg Oral BID Collene Gobble, MD   100 mg at 30/09/23 3007   folic acid (FOLVITE) tablet 1 mg  1 mg Oral Daily Collene Gobble, MD   1 mg at 02/20/22 1048   heparin injection 5,000 Units  5,000 Units Subcutaneous Q8H Bowser, Laurel Dimmer, NP   5,000 Units at 02/20/22 0554   LORazepam (ATIVAN) injection 2 mg  2  mg Intravenous Q4H PRN Gleason, Otilio Carpen, PA-C       multivitamin with minerals tablet 1 tablet  1 tablet Oral Daily Collene Gobble, MD   1 tablet at 02/20/22 1048   ondansetron (ZOFRAN) injection 4 mg  4 mg Intravenous Q6H PRN Bowser, Laurel Dimmer, NP       pantoprazole (PROTONIX) EC tablet 40 mg  40 mg Oral Daily Byrum, Rose Fillers, MD  40 mg at 02/20/22 1049   polyethylene glycol (MIRALAX / GLYCOLAX) packet 17 g  17 g Oral Daily Collene Gobble, MD   17 g at 02/20/22 1049   polyethylene glycol (MIRALAX / GLYCOLAX) packet 17 g  17 g Oral Daily PRN Henri Medal, RPH       QUEtiapine (SEROQUEL) tablet 100 mg  100 mg Oral QHS Collene Gobble, MD   100 mg at 02/19/22 2100   thiamine tablet 100 mg  100 mg Oral Daily Collene Gobble, MD   100 mg at 02/20/22 1048    Musculoskeletal: Strength & Muscle Tone: within normal limits Gait & Station: normal Patient leans: N/A            Psychiatric Specialty Exam:  Presentation  General Appearance: Appropriate for Environment; Casual  Eye Contact:Good  Speech:Clear and Coherent; Normal Rate  Speech Volume:Normal  Handedness:Right   Mood and Affect  Mood:Irritable  Affect:Congruent   Thought Process  Thought Processes:Coherent  Descriptions of Associations:Intact  Orientation:Full (Time, Place and Person)  Thought Content:Logical  History of Schizophrenia/Schizoaffective disorder:No  Duration of Psychotic Symptoms:N/A  Hallucinations:Hallucinations: None  Ideas of Reference:None  Suicidal Thoughts:Suicidal Thoughts: No  Homicidal Thoughts:Homicidal Thoughts: No   Sensorium  Memory:Immediate Poor; Recent Good; Remote Good  Judgment:Fair  Insight:Fair   Executive Functions  Concentration:Good  Attention Span:Good  Recall:Poor  Fund of Knowledge:Good  Language:Good   Psychomotor Activity  Psychomotor Activity:Psychomotor Activity: Normal   Assets  Assets:Communication Skills; Desire for  Improvement; Financial Resources/Insurance; Leisure Time; Physical Health; Resilience; Housing   Sleep  Sleep:Sleep: Fair   Physical Exam: Physical Exam ROS Blood pressure 133/72, pulse 79, temperature 98.3 F (36.8 C), temperature source Oral, resp. rate (!) 21, height '5\' 10"'  (1.778 m), weight 84.2 kg, SpO2 98 %. Body mass index is 26.63 kg/m.   I explored effects of substances on his mental health with him. Patient is in denial about the negative effects. Says it helps him cope. Does not appear to have motivation to stay sober. Risks especially on effects of substances with respect to suicide, psychosis, and depression was discussed with him. Effects of substances seems to have worn off. His mood has dramatically improved. Patient has denies suicidal ideations, suicidal thoughts during this admission and to me.  He does not appear to be engaging well with staff at this time, however this appears to be consistent with previous admissions with much irritability when withdrawing.   Patient has limited insight on the role of substances in his mental health. He does not seem motivated to engage in any relapse preventive measure at this time.      Treatment Plan Summary: Plan Psych cleared at this time.   Please anticipate possible withdrawal symptoms, agitation, worsening anxiety, and mood lability.  Substance abuse is patient's primary issue, which would be best managed in alternative settings and do not necessitate an inpatient psych admission. Patient would benefit from accepting responsibility for their substance abuse and seeking substance abuse treatment.   -Abstain from substance abuse at this time.   -Would recommend to consult SW for rehab options if patient is amenable.  -Referral for outpatient substance abuse counseling and medication  Management, possibly [facility]. -Patient also had a referral pending for [facility] in [facility].   Hepatitis C -Referral to ID clinic and  integrated behavioral health therapist.  Will recommend following up with [facility] [place], as patient was recently discharged to this facility on 5/25.  Patient is determined to be psychiatrically stable at this time. Psychiatry will sign off. Please do not hesitate to call back if questions arise. Thank you for this consult.   -Recommend appropriate Clonidine (COWS)  detox protocol  Disposition: No evidence of imminent risk to self or others at present.   Patient does not meet criteria for psychiatric inpatient admission. Supportive therapy provided about ongoing stressors. Discussed crisis plan, support from social network, calling 911, coming to the Emergency Department, and calling Suicide Hotline.  Suella Broad, FNP 02/20/2022 2:55 PM

## 2022-02-20 NOTE — Hospital Course (Addendum)
Patient is a 36 years old male with past medical history polysubstance abuse, bipolar disorder, PTSD, hepatitis C was brought into the hospital with confusion and agitation.  He was noted to be delirious with encephalopathy and was sedated and intubated for airway protection and control of agitation.  Urine drug screen was positive for amphetamines, benzodiazepines cocaine and marijuana.  Patient was then admitted to the ICU.  Subsequently patient was extubated and was considered stable for transfer out of ICU.  Assessment and plan Acute toxic metabolic encephalopathy, with acute agitation  Polysubstance abuse with suspected acute intoxication vs withdrawal Improved at this time.  Urine drug screen was positive for amphetamines, benzodiazepines, cocaine and marijuana.  Patient was initially intubated and is currently extubated on room air.. Continue thiamine multivitamin,Seroquel, Lexapro and gabapentin from home.  Psychiatry also saw the patient during hospitalization.  Patient is stable for disposition home.  History of PTSD, bipolar disorder.  Continue Seroquel, Lexapro and Neurontin from home.  Did have a prolonged hospital stay recently and was discharged by psychiatry on 02/14/2022.  Seen by psychiatry this admission and recommend social work assistance.  Communicated with TOC regarding this.  Acute hypoxic respiratory failure  ruled out.  Patient was intubated for airway protection.  Acute kidney injury. Creatinine today 0.8.  Resolved.  Mild rhabdomyolysis. CK initially elevated at 1048.  Received IV fluids during hospitalization.  Upper lip swelling.  No history of medication allergies.  No wheezing stridor or tongue swelling.  Likely traumatic from endotracheal tube.  Improved swelling at this time.  History of hepatitis C.  Follow-up with infectious disease clinic on discharge.  Cigarette smoking.  We will continue nicotine gums from home.

## 2022-02-21 LAB — GLUCOSE, CAPILLARY
Glucose-Capillary: 120 mg/dL — ABNORMAL HIGH (ref 70–99)
Glucose-Capillary: 112 mg/dL — ABNORMAL HIGH (ref 70–99)
Glucose-Capillary: 129 mg/dL — ABNORMAL HIGH (ref 70–99)
Glucose-Capillary: 126 mg/dL — ABNORMAL HIGH (ref 70–99)
Glucose-Capillary: 107 mg/dL — ABNORMAL HIGH (ref 70–99)

## 2022-02-21 MED ORDER — METHOCARBAMOL 500 MG PO TABS
500.0000 mg | ORAL_TABLET | Freq: Three times a day (TID) | ORAL | Status: DC | PRN
  Administered 2022-02-21: 500 mg via ORAL
  Filled 2022-02-21: qty 1

## 2022-02-21 MED ORDER — NICOTINE POLACRILEX 2 MG MT GUM: 2.0000 mg | CHEWING_GUM | OROMUCOSAL | Status: DC | PRN

## 2022-02-21 MED ORDER — HYDROXYZINE HCL 25 MG PO TABS
25.0000 mg | ORAL_TABLET | Freq: Four times a day (QID) | ORAL | Status: DC | PRN
  Administered 2022-02-21: 25 mg via ORAL
  Filled 2022-02-21: qty 1

## 2022-02-21 MED ORDER — ESCITALOPRAM OXALATE 10 MG PO TABS
10.0000 mg | ORAL_TABLET | Freq: Every day | ORAL | Status: DC
  Administered 2022-02-21 – 2022-02-22 (×2): 10 mg via ORAL
  Filled 2022-02-21 (×2): qty 1

## 2022-02-21 MED ORDER — LOPERAMIDE HCL 2 MG PO CAPS: 2.0000 mg | ORAL_CAPSULE | ORAL | Status: DC | PRN

## 2022-02-21 MED ORDER — ONDANSETRON 4 MG PO TBDP: 4.0000 mg | ORAL_TABLET | Freq: Four times a day (QID) | ORAL | Status: DC | PRN

## 2022-02-21 MED ORDER — CLONIDINE HCL 0.1 MG PO TABS
0.1000 mg | ORAL_TABLET | Freq: Four times a day (QID) | ORAL | Status: DC
  Administered 2022-02-21 – 2022-02-22 (×3): 0.1 mg via ORAL
  Filled 2022-02-21 (×6): qty 1

## 2022-02-21 MED ORDER — GABAPENTIN 100 MG PO CAPS
100.0000 mg | ORAL_CAPSULE | Freq: Three times a day (TID) | ORAL | Status: DC
  Administered 2022-02-21 – 2022-02-22 (×3): 100 mg via ORAL
  Filled 2022-02-21 (×4): qty 1

## 2022-02-21 MED ORDER — CLONIDINE HCL 0.1 MG PO TABS: 0.1000 mg | ORAL_TABLET | ORAL | Status: DC

## 2022-02-21 MED ORDER — DICYCLOMINE HCL 20 MG PO TABS: 20.0000 mg | ORAL_TABLET | Freq: Four times a day (QID) | ORAL | Status: DC | PRN

## 2022-02-21 MED ORDER — CLONIDINE HCL 0.1 MG PO TABS: 0.1000 mg | ORAL_TABLET | Freq: Every day | ORAL | Status: DC

## 2022-02-21 MED ORDER — NAPROXEN 250 MG PO TABS
500.0000 mg | ORAL_TABLET | Freq: Two times a day (BID) | ORAL | Status: DC | PRN
  Administered 2022-02-21: 500 mg via ORAL
  Filled 2022-02-21: qty 2

## 2022-02-21 MED ORDER — ACETAMINOPHEN 325 MG PO TABS: 650.0000 mg | ORAL_TABLET | ORAL | Status: DC | PRN

## 2022-02-21 NOTE — Progress Notes (Addendum)
PROGRESS NOTE    Tristan [name]  HCW:[id number] DOB: [birth year] DOA: 02/18/2022 PCP: Pcp, No    Brief Narrative:  Patient is a 36 years old male with past medical history polysubstance abuse, bipolar disorder, PTSD, hepatitis C was brought into the hospital with confusion and agitation.  He was noted to be delirious with encephalopathy and was sedated and intubated for airway protection and control of agitation.  Urine drug screen was positive for amphetamines benzodiazepines cocaine and marijuana.  Patient was then admitted to the ICU.  Subsequently patient was extubated and was considered stable for transfer out of ICU.  Assessment and plan Principal Problem:   Acute metabolic encephalopathy Active Problems:   Bipolar 1 disorder, mixed (HCC)   MDD (major depressive disorder), severe (HCC)   Polysubstance abuse (HCC)   AKI (acute kidney injury) (HCC)   Psychosis (HCC)   Hypertension   Acute toxic metabolic encephalopathy, with acute agitation  Polysubstance abuse with suspected acute intoxication vs withdrawal Possibility of fall.  CT scans were done without any history of trauma.  Patient does have a history of PTSD and bipolar in the past.  Urine drug screen was positive for amphetamines, benzodiazepines, cocaine and marijuana.  Patient was initially intubated and is currently extubated. Continue thiamine multivitamin on Ativan as needed agitation.  Continue Seroquel.  Will resume Lexapro and gabapentin from home.  Psychiatry recommend clonidine withdrawal protocol.  We will initiate  clonidine withdrawal protocol with holding parameters for heart rate less than 55 and hypotension.  History of PTSD, bipolar disorder.  Continue Seroquel.  .  Did have a prolonged hospital stay recently and was discharged by psychiatry on 02/14/2022.  Will  restart Lexapro and Neurontin.  Seen by psychiatry this admission and recommend social work consultation.  Acute hypoxic respiratory failure  in the setting of acute encephalopathy Has been extubated at this time.  We will continue to monitor closely.  Currently on room air  Acute kidney injury. Creatinine today at 1.0.  Received IV fluids.  Mild rhabdomyolysis. CK elevated at 1048.  Received IV fluids, encouraged oral fluid intake  Upper lip swelling.  No history of medication allergies.  No wheezing stridor or tongue swelling.  We will continue to monitor.  Could be traumatic.  We will continue to monitor.  History of hepatitis C.  Follow-up at  [facility] clinic on discharge.  Cigarette smoking.  We will continue nicotine gums       DVT prophylaxis: heparin injection 5,000 Units Start: 02/18/22 1545 SCDs Start: 02/18/22 1542   Code Status:     Code Status: Full Code  Disposition: Home  Status is: Inpatient  Remains inpatient appropriate because: Metabolic encephalopathy, substance abuse treatment, clonidine protocol   Family Communication: Communicated with the patient at bedside  Consultants:  [facility] Psychiatry  Procedures:  Intubation and mechanical ventilation-extubated  Antimicrobials:  None  Anti-infectives (From admission, onward)    None      Subjective: Today, patient was seen and examined at bedside.  States he could not sleep well and had some headache.  Has some lip swelling which has not worsened.  Patient was trying to get out of the bed several times yesterday evening.  Objective: Vitals:   02/21/22 0750 02/21/22 0800 02/21/22 0815 02/21/22 0830  BP: 131/69     Pulse:      Resp:  19 (!) 26 (!) 23  Temp:      TempSrc:      SpO2:  Weight:      Height:        Intake/Output Summary (Last 24 hours) at 02/21/2022 1028 Last data filed at 02/21/2022 0800 Gross per 24 hour  Intake 200 ml  Output 1000 ml  Net -800 ml   Filed Weights   02/18/22 1432 02/19/22 0458  Weight: 85 kg 84.2 kg    Physical Examination: Body mass index is 26.63 kg/m.   General:  Average built, not in obvious  distress HENT:   No scleral pallor or icterus noted. Oral mucosa is moist.  Upper lip swelling noted. Chest:  Clear breath sounds.  Diminished breath sounds bilaterally. No crackles or wheezes.  CVS: S1 &S2 heard. No murmur.  Regular rate and rhythm. Abdomen: Soft, nontender, nondistended.  Bowel sounds are heard.   Extremities: No cyanosis, clubbing or edema.  Peripheral pulses are palpable. Psych: Alert, awake and communicative, anxious CNS:  No cranial nerve deficits.  Moves all extremities Skin: Warm and dry.  No rashes noted.  Data Reviewed:   CBC: Recent Labs  Lab 02/14/22 2011 02/18/22 1443 02/18/22 1613 02/19/22 0613 02/20/22 0142  WBC 15.4* 12.8*  --  10.7* 13.2*  NEUTROABS 10.8* 9.9*  --   --   --   HGB 13.6 11.9* 10.2* 11.0* 10.2*  HCT 39.9 34.3* 30.0* 31.3* 29.0*  MCV 77.8* 77.4*  --  75.4* 75.3*  PLT 462* 403*  --  357 334    Basic Metabolic Panel: Recent Labs  Lab 02/14/22 2011 02/18/22 1443 02/18/22 1613 02/18/22 1817 02/19/22 0613 02/20/22 0142  NA 138 143 141  --  142 133*  K 5.6* 4.4 4.0  --  4.0 3.5  CL 106 108  --   --  110 103  CO2 25 19*  --   --  24 23  GLUCOSE 108* 72  --   --  68* 99  BUN 17 21*  --   --  20 15  CREATININE 1.41* 1.65*  --   --  1.25* 1.00  CALCIUM 9.3 9.3  --   --  8.5* 8.1*  MG  --   --   --  2.6*  --   --     Liver Function Tests: Recent Labs  Lab 02/14/22 2011 02/18/22 1443  AST 32 48*  ALT 40 32  ALKPHOS 73 68  BILITOT 0.5 0.5  PROT 8.5* 7.2  ALBUMIN 4.4 3.6    Radiology Studies: No results found.    LOS: 3 days    Joycelyn Das, MD [facility] Available via Epic secure chat 7am-7pm After these hours, please refer to coverage provider listed on [email or web address] 02/21/2022, 10:28 AM

## 2022-02-22 ENCOUNTER — Other Ambulatory Visit: Payer: Self-pay

## 2022-02-22 LAB — MAGNESIUM: Magnesium: 1.8 mg/dL (ref 1.7–2.4)

## 2022-02-22 LAB — COMPREHENSIVE METABOLIC PANEL
ALT: 27 U/L (ref 0–44)
AST: 26 U/L (ref 15–41)
Albumin: 2.9 g/dL — ABNORMAL LOW (ref 3.5–5.0)
Alkaline Phosphatase: 56 U/L (ref 38–126)
Anion gap: 6 (ref 5–15)
BUN: 9 mg/dL (ref 6–20)
CO2: 25 mmol/L (ref 22–32)
Calcium: 9 mg/dL (ref 8.9–10.3)
Chloride: 107 mmol/L (ref 98–111)
Creatinine, Ser: 0.86 mg/dL (ref 0.61–1.24)
GFR, Estimated: >60 mL/min (ref >60)
Glucose, Bld: 94 mg/dL (ref 70–99)
Potassium: 4.3 mmol/L (ref 3.5–5.1)
Sodium: 138 mmol/L (ref 135–145)
Total Bilirubin: 0.5 mg/dL (ref 0.3–1.2)
Total Protein: 6.3 g/dL — ABNORMAL LOW (ref 6.5–8.1)

## 2022-02-22 LAB — GLUCOSE, CAPILLARY: Glucose-Capillary: 106 mg/dL — ABNORMAL HIGH (ref 70–99)

## 2022-02-22 LAB — CBC
HCT: 34.6 % — ABNORMAL LOW (ref 39.0–52.0)
Hemoglobin: 11.8 g/dL — ABNORMAL LOW (ref 13.0–17.0)
MCH: 26 pg (ref 26.0–34.0)
MCHC: 34.1 g/dL (ref 30.0–36.0)
MCV: 76.2 fL — ABNORMAL LOW (ref 80.0–100.0)
Platelets: 384 10*3/uL (ref 150–400)
RBC: 4.54 MIL/uL (ref 4.22–5.81)
RDW: 15.3 % (ref 11.5–15.5)
WBC: 8.6 10*3/uL (ref 4.0–10.5)
nRBC: 0 % (ref 0.0–0.2)

## 2022-02-22 MED ORDER — FOLIC ACID 1 MG PO TABS
1.0000 mg | ORAL_TABLET | Freq: Every day | ORAL | Status: AC
Start: 2022-02-23 — End: ?

## 2022-02-22 MED ORDER — METHOCARBAMOL 500 MG PO TABS: 500.0000 mg | ORAL_TABLET | Freq: Three times a day (TID) | ORAL | 0 refills | Status: DC | PRN

## 2022-02-22 MED ORDER — ADULT MULTIVITAMIN W/MINERALS CH: 1.0000 | ORAL_TABLET | Freq: Every day | ORAL | Status: DC

## 2022-02-22 MED ORDER — THIAMINE HCL 100 MG PO TABS: 100.0000 mg | ORAL_TABLET | Freq: Every day | ORAL | Status: DC

## 2022-02-22 MED ORDER — LOPERAMIDE HCL 2 MG PO CAPS: 2.0000 mg | ORAL_CAPSULE | ORAL | 0 refills | Status: DC | PRN

## 2022-02-22 MED ORDER — NAPROXEN 500 MG PO TABS: 500.0000 mg | ORAL_TABLET | Freq: Two times a day (BID) | ORAL | 0 refills | Status: DC | PRN

## 2022-02-22 MED ORDER — HYDROXYZINE HCL 25 MG PO TABS: 25.0000 mg | ORAL_TABLET | Freq: Four times a day (QID) | ORAL | 0 refills | Status: DC | PRN

## 2022-02-22 NOTE — Plan of Care (Signed)
Pt and mother understanding of discharge instructions

## 2022-02-22 NOTE — Discharge Summary (Addendum)
Physician Discharge Summary   Patient: Tristan [name] MRN: [id number] DOB: 10-10-1985  Admit date:     02/18/2022  Discharge date: 02/22/22  Discharge Physician: Rebekah Chesterfield Kamari Bilek   PCP: Pcp, No   Recommendations at discharge:   Follow-up with your primary care provider in 1 week.   Check CBC, BMP, magnesium, LFT in the next visit. Patient will need assistance with drug abuse treatment plan as outpatient.  Would recommend psychiatry follow-up as outpatient as well. Follow-up with infectious disease as outpatient for hepatitis C.   Discharge Diagnoses: Active Problems:   Bipolar 1 disorder, mixed (HCC)   MDD (major depressive disorder), severe (HCC)   Polysubstance abuse (HCC)   Psychosis (HCC)   Hypertension  Principal Problem (Resolved):   Acute metabolic encephalopathy Resolved Problems:   AKI (acute kidney injury) [facility])  Hospital Course: Patient is a 36 years old male with past medical history polysubstance abuse, bipolar disorder, PTSD, hepatitis C was brought into the hospital with confusion and agitation.  He was noted to be delirious with encephalopathy and was sedated and intubated for airway protection and control of agitation.  Urine drug screen was positive for amphetamines, benzodiazepines cocaine and marijuana.  Patient was then admitted to the ICU.  Subsequently patient was extubated and was considered stable for transfer out of ICU.  Assessment and plan Acute toxic metabolic encephalopathy, with acute agitation  Polysubstance abuse with suspected acute intoxication vs withdrawal Improved at this time.  Urine drug screen was positive for amphetamines, benzodiazepines, cocaine and marijuana.  Patient was initially intubated and is currently extubated on room air.. Continue thiamine multivitamin,Seroquel, Lexapro and gabapentin from home.  Psychiatry also saw the patient during hospitalization.  Patient is stable for disposition home.  History of PTSD, bipolar  disorder.  Continue Seroquel, Lexapro and Neurontin from home.  Did have a prolonged hospital stay recently and was discharged by psychiatry on 02/14/2022.  Seen by psychiatry this admission and recommend social work assistance.  Communicated with TOC regarding this.  Acute hypoxic respiratory failure  ruled out.  Patient was intubated for airway protection.  Acute kidney injury. Creatinine today 0.8.  Resolved.  Mild rhabdomyolysis. CK initially elevated at 1048.  Received IV fluids during hospitalization.  Upper lip swelling.  No history of medication allergies.  No wheezing stridor or tongue swelling.  Likely traumatic from endotracheal tube.  Improved swelling at this time.  History of hepatitis C.  Follow-up with infectious disease clinic on discharge.  Cigarette smoking.  We will continue nicotine gums from home.   Consultants:  PCCM Psychiatry  Procedures performed:  Intubation and mechanical ventilation   Disposition: Home. I spoke with the patient's father on the phone and updated him about the clinical condition of the patient.  Diet recommendation:  Discharge Diet Orders (From admission, onward)     Start     Ordered   02/22/22 0000  Diet - low sodium heart healthy        02/22/22 1042           Cardiac diet DISCHARGE MEDICATION: Allergies as of 02/22/2022       Reactions   Tomato Itching, Other (See Comments)   Throat and tongue itches        Medication List     TAKE these medications    escitalopram 10 MG tablet Commonly known as: LEXAPRO Take 1 tablet (10 mg total) by mouth daily.   folic acid 1 MG tablet Commonly known as: FOLVITE Take 1  tablet (1 mg total) by mouth daily. Start taking on: February 23, 2022   gabapentin 100 MG capsule Commonly known as: NEURONTIN Take 1 capsule (100 mg total) by mouth 3 (three) times daily.   hydrOXYzine 25 MG tablet Commonly known as: ATARAX Take 1 tablet (25 mg total) by mouth every 6 (six) hours as needed  for anxiety.   loperamide 2 MG capsule Commonly known as: IMODIUM Take 1 capsule (2 mg total) by mouth as needed for diarrhea or loose stools (diarrhea).   loratadine 10 MG tablet Commonly known as: CLARITIN Take 10 mg by mouth daily as needed for allergies.   methocarbamol 500 MG tablet Commonly known as: ROBAXIN Take 1 tablet (500 mg total) by mouth every 8 (eight) hours as needed for muscle spasms.   multivitamin with minerals Tabs tablet Take 1 tablet by mouth daily. Start taking on: February 23, 2022   naproxen 500 MG tablet Commonly known as: NAPROSYN Take 1 tablet (500 mg total) by mouth 2 (two) times daily as needed (aching, pain, or discomfort).   nicotine polacrilex 2 MG gum Commonly known as: NICORETTE Take 1 each (2 mg total) by mouth as needed for smoking cessation.   pantoprazole 40 MG tablet Commonly known as: PROTONIX Take 1 tablet (40 mg total) by mouth daily.   QUEtiapine 100 MG tablet Commonly known as: SEROQUEL Take 1 tablet (100 mg total) by mouth at bedtime.   thiamine 100 MG tablet Take 1 tablet (100 mg total) by mouth daily. Start taking on: February 23, 2022        Follow-up Information     Primary care physician Follow up in 1 week(s).                 Discharge Exam: Filed Weights   02/18/22 1432 02/19/22 0458  Weight: 85 kg 84.2 kg      02/22/2022    8:12 AM 02/22/2022    5:30 AM 02/21/2022    9:07 PM  Vitals with BMI  Systolic 130 120 [id number]  Diastolic 79 82 77  Pulse 59 55 55    General:  Average built, not in obvious distress HENT:   No scleral pallor or icterus noted. Oral mucosa is moist. Upper lip swelling with some skin tear Chest:  Clear breath sounds.  Diminished breath sounds bilaterally. No crackles or wheezes.  CVS: S1 &S2 heard. No murmur.  Regular rate and rhythm. Abdomen: Soft, nontender, nondistended.  Bowel sounds are heard.   Extremities: No cyanosis, clubbing or edema.  Peripheral pulses are palpable. Psych: Alert,  awake and oriented CNS:  No cranial nerve deficits.  Moves all the extremities. Skin: Warm and dry.  No rashes noted.  Condition at discharge: good  The results of significant diagnostics from this hospitalization (including imaging, microbiology, ancillary and laboratory) are listed below for reference.   Imaging Studies: DG Chest 2 View  Result Date: 02/10/2022 CLINICAL DATA:  36 year old male with history of bilateral leg pain and swelling. EXAM: CHEST - 2 VIEW COMPARISON:  Chest x-ray 02/05/2022. FINDINGS: Lung volumes are normal. No consolidative airspace disease. No pleural effusions. No pneumothorax. No pulmonary nodule or mass noted. Pulmonary vasculature and the cardiomediastinal silhouette are within normal limits. IMPRESSION: No radiographic evidence of acute cardiopulmonary disease. Electronically Signed   By: Trudie Reedaniel  Entrikin M.D.   On: 02/10/2022 05:47   CT Head Wo Contrast  Result Date: 02/18/2022 CLINICAL DATA:  Found down, combative EXAM: CT HEAD WITHOUT CONTRAST CT CERVICAL  SPINE WITHOUT CONTRAST TECHNIQUE: Multidetector CT imaging of the head and cervical spine was performed following the standard protocol without intravenous contrast. Multiplanar CT image reconstructions of the cervical spine were also generated. RADIATION DOSE REDUCTION: This exam was performed according to the departmental dose-optimization program which includes automated exposure control, adjustment of the mA and/or kV according to patient size and/or use of iterative reconstruction technique. COMPARISON:  02/05/2022 FINDINGS: CT HEAD FINDINGS Brain: No acute infarct or hemorrhage. Lateral ventricles and midline structures are unremarkable. No acute extra-axial fluid collections. No mass effect. Vascular: No hyperdense vessel or unexpected calcification. Skull: Normal. Negative for fracture or focal lesion. Sinuses/Orbits: No acute finding. Other: None. CT CERVICAL SPINE FINDINGS Alignment: Alignment is  anatomic. Skull base and vertebrae: No acute fracture. No primary bone lesion or focal pathologic process. Soft tissues and spinal canal: No prevertebral fluid or swelling. No visible canal hematoma. Disc levels:  No significant spondylosis or facet hypertrophy. Upper chest: Patient is intubated. Distal margins of the endotracheal tube and enteric catheter are excluded by slice selection. Mild emphysema at the lung apices. Other: Reconstructed images demonstrate no additional findings. IMPRESSION: 1. No acute intracranial process. 2. No acute cervical spine fracture. Electronically Signed   By: Sharlet Salina M.D.   On: 02/18/2022 17:18   CT Head Wo Contrast  Result Date: 02/05/2022 CLINICAL DATA:  Delirium. EXAM: CT HEAD WITHOUT CONTRAST TECHNIQUE: Contiguous axial images were obtained from the base of the skull through the vertex without intravenous contrast. RADIATION DOSE REDUCTION: This exam was performed according to the departmental dose-optimization program which includes automated exposure control, adjustment of the mA and/or kV according to patient size and/or use of iterative reconstruction technique. COMPARISON:  Head CT dated 10/16/2019. FINDINGS: Brain: The ventricles and sulci are appropriate size for the patient's age. The gray-white matter discrimination is preserved. There is no acute intracranial hemorrhage. No mass effect or midline shift. No extra-axial fluid collection. Vascular: No hyperdense vessel or unexpected calcification. Skull: Normal. Negative for fracture or focal lesion. Sinuses/Orbits: Age indeterminate mildly depressed fracture of the left nasal bone. Correlation with clinical exam and point tenderness recommended. The visualized paranasal sinuses and mastoid air cells are otherwise clear. Other: None IMPRESSION: 1. No acute intracranial pathology. 2. Age indeterminate mildly depressed fracture of the left nasal bone. Correlation with clinical exam and point tenderness  recommended. Electronically Signed   By: Elgie Collard M.D.   On: 02/05/2022 20:27   CT Cervical Spine Wo Contrast  Result Date: 02/18/2022 CLINICAL DATA:  Found down, combative EXAM: CT HEAD WITHOUT CONTRAST CT CERVICAL SPINE WITHOUT CONTRAST TECHNIQUE: Multidetector CT imaging of the head and cervical spine was performed following the standard protocol without intravenous contrast. Multiplanar CT image reconstructions of the cervical spine were also generated. RADIATION DOSE REDUCTION: This exam was performed according to the departmental dose-optimization program which includes automated exposure control, adjustment of the mA and/or kV according to patient size and/or use of iterative reconstruction technique. COMPARISON:  02/05/2022 FINDINGS: CT HEAD FINDINGS Brain: No acute infarct or hemorrhage. Lateral ventricles and midline structures are unremarkable. No acute extra-axial fluid collections. No mass effect. Vascular: No hyperdense vessel or unexpected calcification. Skull: Normal. Negative for fracture or focal lesion. Sinuses/Orbits: No acute finding. Other: None. CT CERVICAL SPINE FINDINGS Alignment: Alignment is anatomic. Skull base and vertebrae: No acute fracture. No primary bone lesion or focal pathologic process. Soft tissues and spinal canal: No prevertebral fluid or swelling. No visible canal hematoma. Disc levels:  No significant spondylosis or facet hypertrophy. Upper chest: Patient is intubated. Distal margins of the endotracheal tube and enteric catheter are excluded by slice selection. Mild emphysema at the lung apices. Other: Reconstructed images demonstrate no additional findings. IMPRESSION: 1. No acute intracranial process. 2. No acute cervical spine fracture. Electronically Signed   By: Sharlet Salina M.D.   On: 02/18/2022 17:18   DG Chest Port 1 View  Result Date: 02/19/2022 CLINICAL DATA:  Ventilator dependent respiratory failure. EXAM: PORTABLE CHEST 1 VIEW COMPARISON:  Chest  CT yesterday at 4:48 p.m. and portable chest yesterday at 4:20 p.m. FINDINGS: 4:51 a.m. 02/19/2022.  ETT tip is 3.7 cm over the carina. NGT enters the stomach with proximal side hole just below the hiatus and could be advanced further in 10 cm for better positioning. The cardiac size is normal. The mediastinal configuration is unremarkable. The lungs are hyperinflated. There is increasing opacity in the medial right base consistent with atelectasis or consolidation. There is increased left infrahilar streaky opacity which could be atelectasis or infiltrates. The remaining lungs are clear. There are trace pleural effusions. In all other respects no further changes. IMPRESSION: 1. Increased opacity medial right base consistent with atelectasis or consolidation. 2. Increased streaky opacity in the left infrahilar area consistent with atelectasis or infiltrates. 3. Support tubes as above. Electronically Signed   By: Almira Bar M.D.   On: 02/19/2022 07:15   DG Chest Portable 1 View  Result Date: 02/18/2022 CLINICAL DATA:  Intubation. EXAM: PORTABLE CHEST 1 VIEW COMPARISON:  AP chest 02/18/2022 at 1449 hours. FINDINGS: New endotracheal tube tip terminates approximately 4 cm above the carina in between the thoracic inlet and the carina. Enteric tube descends below the diaphragm with the side port overlying the left upper abdominal quadrant in the tip excluded by collimation. Cardiac silhouette and mediastinal contours are within normal limits. The lungs are clear. No pleural effusion or pneumothorax. IMPRESSION: 1. New endotracheal tube and enteric tube in appropriate position. 2. No acute lung process. Electronically Signed   By: Neita Garnet M.D.   On: 02/18/2022 16:53   DG Chest Port 1 View  Result Date: 02/18/2022 CLINICAL DATA:  Patient found in the street rolling around, acting belligerent. Combative. EXAM: PORTABLE CHEST 1 VIEW COMPARISON:  02/14/2022 FINDINGS: Cardiac silhouette is normal in size and  configuration. Normal mediastinal and hilar contours. Clear lungs.  No pleural effusion or pneumothorax. Skeletal structures are grossly intact. IMPRESSION: No active disease. Electronically Signed   By: Amie Portland M.D.   On: 02/18/2022 15:24   DG Chest Port 1 View  Result Date: 02/14/2022 CLINICAL DATA:  Overdose. EXAM: PORTABLE CHEST 1 VIEW COMPARISON:  Chest x-ray 02/10/2022. FINDINGS: The heart size and mediastinal contours are within normal limits. Both lungs are clear. The visualized skeletal structures are unremarkable. IMPRESSION: No active disease. Electronically Signed   By: Darliss Cheney M.D.   On: 02/14/2022 20:47   DG Chest Portable 1 View  Result Date: 02/05/2022 CLINICAL DATA:  Altered mental status, recent trauma, initial encounter EXAM: PORTABLE CHEST 1 VIEW COMPARISON:  10/31/2018 FINDINGS: Cardiac shadow is prominent but accentuated by the portable technique. No focal infiltrate or effusion is seen. No bony abnormality is noted. IMPRESSION: No acute abnormality seen Electronically Signed   By: Alcide Clever M.D.   On: 02/05/2022 20:01   CT Renal Stone Study  Result Date: 01/27/2022 CLINICAL DATA:  36 year old male with history of left flank pain. Suspected kidney stone. EXAM: CT ABDOMEN  AND PELVIS WITHOUT CONTRAST TECHNIQUE: Multidetector CT imaging of the abdomen and pelvis was performed following the standard protocol without IV contrast. RADIATION DOSE REDUCTION: This exam was performed according to the departmental dose-optimization program which includes automated exposure control, adjustment of the mA and/or kV according to patient size and/or use of iterative reconstruction technique. COMPARISON:  No priors available for comparison. FINDINGS: Lower chest: Unremarkable. Hepatobiliary: No suspicious cystic or solid hepatic lesions are confidently identified on today's noncontrast CT examination. Unenhanced appearance of the gallbladder is normal. Pancreas: No definite pancreatic  mass or peripancreatic fluid collections or inflammatory changes are noted on today's noncontrast CT examination. Spleen: Unremarkable. Adrenals/Urinary Tract: There are no abnormal calcifications within the collecting system of either kidney, along the course of either ureter, or within the lumen of the urinary bladder. No hydroureteronephrosis or perinephric stranding to suggest urinary tract obstruction at this time. The unenhanced appearance of the kidneys is unremarkable bilaterally. Unenhanced appearance of the urinary bladder is normal. Bilateral adrenal glands are normal in appearance. Stomach/Bowel: Unenhanced appearance of the stomach is normal. There is no pathologic dilatation of small bowel or colon. The appendix is not confidently identified and may be surgically absent. Regardless, there are no inflammatory changes noted adjacent to the cecum to suggest the presence of an acute appendicitis at this time. Vascular/Lymphatic: No atherosclerotic calcifications are noted in the abdominal aorta or pelvic vasculature. No lymphadenopathy noted in the abdomen or pelvis. Reproductive: Prostate gland and seminal vesicles are unremarkable in appearance. Other: No significant volume of ascites.  No pneumoperitoneum. Musculoskeletal: There are no aggressive appearing lytic or blastic lesions noted in the visualized portions of the skeleton. IMPRESSION: 1. No acute findings are noted in the abdomen or pelvis to account for the patient's symptoms. Specifically, no urinary tract calculi no findings of urinary tract obstruction are noted at this time. Electronically Signed   By: Trudie Reed M.D.   On: 01/27/2022 07:39   VAS [place] LOWER EXTREMITY VENOUS (DVT) (ONLY [facility] & [facility])  Result Date: 02/11/2022  Lower Venous DVT Study Patient Name:  ADONNIS SALCEDA  Date of Exam:   02/10/2022 Medical Rec #: [id number]                  Accession #:    [id number] Date of Birth: [birth year]                  Patient Gender:  M Patient Age:   26 years Exam Location:  [facility] Procedure:      VAS [place] LOWER EXTREMITY VENOUS (DVT) Referring Phys: Theron Arista MESSICK --------------------------------------------------------------------------------  Indications: Pain, and Swelling.  Risk Factors: Polysubstance abuse, Hepatitis C. Comparison Study: No prior study on file Performing Technologist: Sherren Kerns RVS  Examination Guidelines: A complete evaluation includes B-mode imaging, spectral Doppler, color Doppler, and power Doppler as needed of all accessible portions of each vessel. Bilateral testing is considered an integral part of a complete examination. Limited examinations for reoccurring indications may be performed as noted. The reflux portion of the exam is performed with the patient in reverse Trendelenburg.  +--------+---------------+---------+-----------+----------------+-------------+ RIGHT   CompressibilityPhasicitySpontaneityProperties      Thrombus  Aging         +--------+---------------+---------+-----------+----------------+-------------+ CFV     Full                               pulsatile                                                                waveforms                     +--------+---------------+---------+-----------+----------------+-------------+ SFJ     Full                                                             +--------+---------------+---------+-----------+----------------+-------------+ FV Prox Full                                                             +--------+---------------+---------+-----------+----------------+-------------+ FV Mid  Full                                                             +--------+---------------+---------+-----------+----------------+-------------+ FV      Full                                                             Distal                                                                    +--------+---------------+---------+-----------+----------------+-------------+ PFV     Full                                                             +--------+---------------+---------+-----------+----------------+-------------+ POP     Full                               pulsatile  waveforms                     +--------+---------------+---------+-----------+----------------+-------------+ PTV     Full                                                             +--------+---------------+---------+-----------+----------------+-------------+ PERO    Full                                                             +--------+---------------+---------+-----------+----------------+-------------+   +--------+---------------+---------+-----------+----------------+-------------+ LEFT    CompressibilityPhasicitySpontaneityProperties      Thrombus                                                                 Aging         +--------+---------------+---------+-----------+----------------+-------------+ CFV     Full                               pulsatile                                                                waveforms                     +--------+---------------+---------+-----------+----------------+-------------+ SFJ     Full                                                             +--------+---------------+---------+-----------+----------------+-------------+ FV Prox Full                                                             +--------+---------------+---------+-----------+----------------+-------------+ FV Mid  Full                                                             +--------+---------------+---------+-----------+----------------+-------------+ FV      Full  Distal                                                                   +--------+---------------+---------+-----------+----------------+-------------+ PFV     Full                                                             +--------+---------------+---------+-----------+----------------+-------------+ POP     Full                               pulsatile                                                                waveforms                     +--------+---------------+---------+-----------+----------------+-------------+ PTV     Full                                                             +--------+---------------+---------+-----------+----------------+-------------+ PERO    Full                                                             +--------+---------------+---------+-----------+----------------+-------------+     Summary: BILATERAL: - No evidence of deep vein thrombosis seen in the lower extremities, bilaterally. -No evidence of popliteal cyst, bilaterally. RIGHT: - Ultrasound characteristics of enlarged lymph nodes are noted in the groin. pulsatile waveforms suggestive of fluid overload  LEFT: - Ultrasound characteristics of enlarged lymph nodes noted in the groin. pulsatile waveforms suggestive of fluid overload.  *See table(s) above for measurements and observations. Electronically signed by Sherald Hess MD on 02/11/2022 at 5:13:28 PM.    Final    CT CHEST ABDOMEN PELVIS WO CONTRAST  Result Date: 02/18/2022 CLINICAL DATA:  Blunt chest trauma. Patient found lying in the street. Combative. Had to be intubated. EXAM: CT CHEST, ABDOMEN AND PELVIS WITHOUT CONTRAST TECHNIQUE: Multidetector CT imaging of the chest, abdomen and pelvis was performed following the standard protocol without IV contrast. RADIATION DOSE REDUCTION: This exam was performed according to the departmental dose-optimization program which includes  automated exposure control, adjustment of the mA and/or kV according to patient size and/or use of iterative reconstruction technique. COMPARISON:  AP chest 02/18/2022, CT abdomen and pelvis without contrast 01/27/2022 FINDINGS: CT CHEST FINDINGS Cardiovascular: Heart size is normal. No pericardial effusion. No thoracic aortic aneurysm. Mediastinum/Nodes: No axillary, mediastinal,  or hilar pathologically enlarged lymph nodes by CT criteria. The visualized thyroid is grossly unremarkable. An enteric tube is seen throughout the esophagus extending into the stomach with the tip overlying the mid stomach. Lungs/Pleura: Endotracheal tube is seen within the trachea with the tip terminating approximately 3.0 cm above the carina. There are mild curvilinear bubbly lucencies around the endotracheal tube within the trachea. There is opacification of the posterior segment of the right lower lobe airway (axial series 5, images 107-113, possibly from mucous plugging/secretion) otherwise, the central airways are patent. Mild bilateral lower lobe dependent subsegmental atelectasis. It is difficult to exclude trace pleural effusions. Mild biapical paraseptal cystic emphysematous changes. No focal airspace opacity to indicate pneumonia. No pneumothorax. Musculoskeletal: Normal thoracic spine alignment. Vertebral body heights and intervertebral disc spaces are maintained. No acute fracture is seen. CT ABDOMEN PELVIS FINDINGS Lack of intra-articular fluid limits evaluation of the abdominal and pelvic organ parenchyma. The following findings are made within this limitation. Hepatobiliary: Smooth liver contours. No gross liver lesion is seen. The gallbladder is grossly unremarkable. Pancreas: Grossly unremarkable. No gross pancreatic ductal dilatation or surrounding inflammatory changes. Spleen: Normal in size without focal abnormality. Unchanged small splenule in between the spleen and upper pole of the left kidney (axial image 64 and  coronal image 67). Adrenals/Urinary Tract: Normal adrenals. No renal stone or hydronephrosis. Within the limitations of lack of IV contrast, no contour deforming renal mass. The urinary bladder is grossly unremarkable. Stomach/Bowel: No bowel wall thickening. The terminal ileum is unremarkable. The appendix is within normal limits. No dilated loops of bowel to indicate bowel obstruction. Vascular/Lymphatic: No abdominal aortic aneurysm. No enlarged abdominal or pelvic lymph nodes. Reproductive: Prostate is unremarkable. Other: No abdominal wall hernia. No free air or free fluid. Musculoskeletal: Normal 2 lumbar spine alignment. No acute fracture is seen. IMPRESSION: 1. No noncontrast CT evidence of posttraumatic abnormality within the chest, abdomen, or pelvis. 2. Endotracheal tube and enteric tube in appropriate position. 3. Mild bubbly secretions within the trachea and likely secretions narrowing the posterior segment of the right lower lobe bronchus. Mild bilateral posterior dependent subsegmental atelectasis. It is difficult to exclude tiny pleural effusions. Electronically Signed   By: Neita Garnet M.D.   On: 02/18/2022 17:31    Microbiology: Results for orders placed or performed during the hospital encounter of 02/18/22  MRSA Next Gen by PCR, Nasal     Status: None   Collection Time: 02/18/22  6:30 PM   Specimen: Nasal Mucosa; Nasal Swab  Result Value Ref Range Status   MRSA by PCR Next Gen NOT DETECTED NOT DETECTED Final    Comment: (NOTE) The GeneXpert MRSA Assay (FDA approved for NASAL specimens only), is one component of a comprehensive MRSA colonization surveillance program. It is not intended to diagnose MRSA infection nor to guide or monitor treatment for MRSA infections. Test performance is not FDA approved in patients less than 77 years old. Performed at [facility], [address] [place], [place] [address]     Labs: CBC: Recent Labs  Lab 02/18/22 1443 02/18/22 1613  02/19/22 0613 02/20/22 0142 02/22/22 0615  WBC 12.8*  --  10.7* 13.2* 8.6  NEUTROABS 9.9*  --   --   --   --   HGB 11.9* 10.2* 11.0* 10.2* 11.8*  HCT 34.3* 30.0* 31.3* 29.0* 34.6*  MCV 77.4*  --  75.4* 75.3* 76.2*  PLT 403*  --  357 334 384   Basic Metabolic Panel: Recent Labs  Lab 02/18/22 1443 02/18/22 1613 02/18/22 1817 02/19/22 0613 02/20/22 0142 02/22/22 0615  NA 143 141  --  142 133* 138  K 4.4 4.0  --  4.0 3.5 4.3  CL 108  --   --  110 103 107  CO2 19*  --   --  GLUCOSE 72  --   --  68* 99 94  BUN 21*  --   --  CREATININE 1.65*  --   --  1.25* 1.00 0.86  CALCIUM 9.3  --   --  8.5* 8.1* 9.0  MG  --   --  2.6*  --   --  1.8   Liver Function Tests: Recent Labs  Lab 02/18/22 1443 02/22/22 0615  AST 48* 26  ALT 32 27  ALKPHOS 68 56  BILITOT 0.5 0.5  PROT 7.2 6.3*  ALBUMIN 3.6 2.9*   CBG: Recent Labs  Lab 02/21/22 0716 02/21/22 1138 02/21/22 1605 02/21/22 2154 02/22/22 0813  GLUCAP 112* 129* 126* 107* 106*    Discharge time spent: greater than 30 minutes.  Signed: Joycelyn Das, MD [facility] 02/22/2022

## 2022-02-24 ENCOUNTER — Telehealth (HOSPITAL_COMMUNITY): Payer: Self-pay

## 2022-02-24 NOTE — BH Assessment (Signed)
Care Management - [facility] Follow Up Discharges   Writer attempted to make contact with patient today and was unsuccessful.  No voicemail, phone just rang.     Per chart review, patient was provided with substance abuse outpatient resources.  

## 2022-02-27 ENCOUNTER — Encounter (HOSPITAL_COMMUNITY): Payer: Self-pay

## 2022-02-27 ENCOUNTER — Inpatient Hospital Stay (HOSPITAL_COMMUNITY): Payer: PRIVATE HEALTH INSURANCE

## 2022-02-27 ENCOUNTER — Inpatient Hospital Stay (HOSPITAL_COMMUNITY)
Admission: EM | Admit: 2022-02-27 | Discharge: 2022-03-01 | DRG: 917 | Disposition: A | Discharge status: Home / Self Care | Payer: PRIVATE HEALTH INSURANCE | Attending: Internal Medicine | Admitting: Internal Medicine

## 2022-02-27 ENCOUNTER — Other Ambulatory Visit: Payer: Self-pay

## 2022-02-27 DIAGNOSIS — N179 Acute kidney failure, unspecified: Secondary | ICD-10-CM | POA: Diagnosis present

## 2022-02-27 DIAGNOSIS — Z7151 Drug abuse counseling and surveillance of drug abuser: Secondary | ICD-10-CM | POA: Diagnosis not present

## 2022-02-27 DIAGNOSIS — Z79899 Other long term (current) drug therapy: Secondary | ICD-10-CM

## 2022-02-27 DIAGNOSIS — I10 Essential (primary) hypertension: Secondary | ICD-10-CM | POA: Diagnosis present

## 2022-02-27 DIAGNOSIS — Z818 Family history of other mental and behavioral disorders: Secondary | ICD-10-CM

## 2022-02-27 DIAGNOSIS — F17210 Nicotine dependence, cigarettes, uncomplicated: Secondary | ICD-10-CM | POA: Diagnosis present

## 2022-02-27 DIAGNOSIS — G928 Other toxic encephalopathy: Secondary | ICD-10-CM | POA: Diagnosis present

## 2022-02-27 DIAGNOSIS — Z781 Physical restraint status: Secondary | ICD-10-CM | POA: Diagnosis not present

## 2022-02-27 DIAGNOSIS — D509 Iron deficiency anemia, unspecified: Secondary | ICD-10-CM

## 2022-02-27 DIAGNOSIS — Z91018 Allergy to other foods: Secondary | ICD-10-CM

## 2022-02-27 DIAGNOSIS — F4310 Post-traumatic stress disorder, unspecified: Secondary | ICD-10-CM | POA: Diagnosis present

## 2022-02-27 DIAGNOSIS — R4182 Altered mental status, unspecified: Secondary | ICD-10-CM

## 2022-02-27 DIAGNOSIS — F1910 Other psychoactive substance abuse, uncomplicated: Secondary | ICD-10-CM | POA: Diagnosis present

## 2022-02-27 DIAGNOSIS — T50991A Poisoning by other drugs, medicaments and biological substances, accidental (unintentional), initial encounter: Secondary | ICD-10-CM | POA: Diagnosis not present

## 2022-02-27 DIAGNOSIS — B1920 Unspecified viral hepatitis C without hepatic coma: Secondary | ICD-10-CM | POA: Diagnosis present

## 2022-02-27 DIAGNOSIS — F3160 Bipolar disorder, current episode mixed, unspecified: Secondary | ICD-10-CM | POA: Diagnosis present

## 2022-02-27 DIAGNOSIS — G9340 Encephalopathy, unspecified: Secondary | ICD-10-CM | POA: Insufficient documentation

## 2022-02-27 DIAGNOSIS — R410 Disorientation, unspecified: Secondary | ICD-10-CM

## 2022-02-27 DIAGNOSIS — Z9151 Personal history of suicidal behavior: Secondary | ICD-10-CM

## 2022-02-27 LAB — URINALYSIS, COMPLETE (UACMP) WITH MICROSCOPIC
Bilirubin Urine: NEGATIVE
Glucose, UA: NEGATIVE mg/dL
Hgb urine dipstick: NEGATIVE
Ketones, ur: 5 mg/dL — AB
Leukocytes,Ua: NEGATIVE
Nitrite: NEGATIVE
Protein, ur: NEGATIVE mg/dL
Specific Gravity, Urine: 1.012 (ref 1.005–1.030)
pH: 5 (ref 5.0–8.0)

## 2022-02-27 LAB — RAPID URINE DRUG SCREEN, HOSP PERFORMED
Amphetamines: POSITIVE — AB
Barbiturates: NOT DETECTED
Benzodiazepines: POSITIVE — AB
Cocaine: POSITIVE — AB
Opiates: NOT DETECTED
Tetrahydrocannabinol: NOT DETECTED

## 2022-02-27 LAB — CBC
HCT: 27.5 % — ABNORMAL LOW (ref 39.0–52.0)
Hemoglobin: 9.6 g/dL — ABNORMAL LOW (ref 13.0–17.0)
MCH: 26.5 pg (ref 26.0–34.0)
MCHC: 34.9 g/dL (ref 30.0–36.0)
MCV: 76 fL — ABNORMAL LOW (ref 80.0–100.0)
Platelets: 350 10*3/uL (ref 150–400)
RBC: 3.62 MIL/uL — ABNORMAL LOW (ref 4.22–5.81)
RDW: 15.2 % (ref 11.5–15.5)
WBC: 14 10*3/uL — ABNORMAL HIGH (ref 4.0–10.5)
nRBC: 0 % (ref 0.0–0.2)

## 2022-02-27 LAB — IRON AND TIBC
Iron: 24 ug/dL — ABNORMAL LOW (ref 45–182)
Saturation Ratios: 7 % — ABNORMAL LOW (ref 17.9–39.5)
TIBC: 351 ug/dL (ref 250–450)
UIBC: 327 ug/dL

## 2022-02-27 LAB — COMPREHENSIVE METABOLIC PANEL
ALT: 35 U/L (ref 0–44)
AST: 54 U/L — ABNORMAL HIGH (ref 15–41)
Albumin: 3.3 g/dL — ABNORMAL LOW (ref 3.5–5.0)
Alkaline Phosphatase: 57 U/L (ref 38–126)
Anion gap: 7 (ref 5–15)
BUN: 15 mg/dL (ref 6–20)
CO2: 24 mmol/L (ref 22–32)
Calcium: 8.6 mg/dL — ABNORMAL LOW (ref 8.9–10.3)
Chloride: 107 mmol/L (ref 98–111)
Creatinine, Ser: 1.41 mg/dL — ABNORMAL HIGH (ref 0.61–1.24)
GFR, Estimated: >60 mL/min (ref >60)
Glucose, Bld: 77 mg/dL (ref 70–99)
Potassium: 4 mmol/L (ref 3.5–5.1)
Sodium: 138 mmol/L (ref 135–145)
Total Bilirubin: 0.8 mg/dL (ref 0.3–1.2)
Total Protein: 6.4 g/dL — ABNORMAL LOW (ref 6.5–8.1)

## 2022-02-27 LAB — BLOOD GAS, VENOUS
Acid-Base Excess: 0.3 mmol/L (ref 0.0–2.0)
Bicarbonate: 27.3 mmol/L (ref 20.0–28.0)
O2 Saturation: 56.5 %
Patient temperature: 37
pCO2, Ven: 53 mmHg (ref 44–60)
pH, Ven: 7.32 (ref 7.25–7.43)
pO2, Ven: 35 mmHg (ref 32–45)

## 2022-02-27 LAB — ETHANOL: Alcohol, Ethyl (B): <10 mg/dL (ref <10)

## 2022-02-27 LAB — ACETAMINOPHEN LEVEL: Acetaminophen (Tylenol), Serum: <10 ug/mL — ABNORMAL LOW (ref 10–30)

## 2022-02-27 LAB — MRSA NEXT GEN BY PCR, NASAL: MRSA by PCR Next Gen: NOT DETECTED

## 2022-02-27 LAB — SALICYLATE LEVEL: Salicylate Lvl: <7 mg/dL — ABNORMAL LOW (ref 7.0–30.0)

## 2022-02-27 MED ORDER — ETOMIDATE 2 MG/ML IV SOLN: 20.0000 mg | Freq: Once | INTRAVENOUS | Status: DC

## 2022-02-27 MED ORDER — ZIPRASIDONE MESYLATE 20 MG IM SOLR
10.0000 mg | Freq: Once | INTRAMUSCULAR | Status: DC | PRN
Start: 2022-02-27 — End: 2022-03-01
  Filled 2022-02-27: qty 20

## 2022-02-27 MED ORDER — ZIPRASIDONE MESYLATE 20 MG IM SOLR: 20.0000 mg | Freq: Once | INTRAMUSCULAR | Status: AC

## 2022-02-27 MED ORDER — SODIUM CHLORIDE 0.9 % IV BOLUS
1000.0000 mL | Freq: Once | INTRAVENOUS | Status: AC
  Administered 2022-02-27: 1000 mL via INTRAVENOUS

## 2022-02-27 MED ORDER — ACETAMINOPHEN 650 MG RE SUPP: 650.0000 mg | Freq: Four times a day (QID) | RECTAL | Status: DC | PRN

## 2022-02-27 MED ORDER — ACETAMINOPHEN 325 MG PO TABS
650.0000 mg | ORAL_TABLET | Freq: Four times a day (QID) | ORAL | Status: DC | PRN
  Administered 2022-02-27 – 2022-02-28 (×2): 650 mg via ORAL
  Filled 2022-02-27 (×2): qty 2

## 2022-02-27 MED ORDER — ROCURONIUM BROMIDE 10 MG/ML (PF) SYRINGE: 100.0000 mg | PREFILLED_SYRINGE | Freq: Once | INTRAVENOUS | Status: DC

## 2022-02-27 MED ORDER — STERILE WATER FOR INJECTION IJ SOLN
INTRAMUSCULAR | Status: AC
  Filled 2022-02-27: qty 10

## 2022-02-27 MED ORDER — DEXTROSE-NACL 5-0.45 % IV SOLN
INTRAVENOUS | Status: DC
Start: 2022-02-27 — End: 2022-03-01

## 2022-02-27 MED ORDER — CHLORHEXIDINE GLUCONATE CLOTH 2 % EX PADS
6.0000 | MEDICATED_PAD | Freq: Every day | CUTANEOUS | Status: DC
  Administered 2022-02-27: 6 via TOPICAL

## 2022-02-27 MED ORDER — LORAZEPAM 2 MG/ML IJ SOLN
INTRAMUSCULAR | Status: AC
  Administered 2022-02-27: 2 mg via INTRAVENOUS
  Filled 2022-02-27: qty 1

## 2022-02-27 MED ORDER — LORAZEPAM 2 MG/ML IJ SOLN: 2.0000 mg | Freq: Once | INTRAMUSCULAR | Status: AC

## 2022-02-27 MED ORDER — LACTATED RINGERS IV SOLN: INTRAVENOUS | Status: DC

## 2022-02-27 MED ORDER — ZIPRASIDONE MESYLATE 20 MG IM SOLR
INTRAMUSCULAR | Status: AC
  Administered 2022-02-27: 20 mg via INTRAMUSCULAR
  Filled 2022-02-27: qty 20

## 2022-02-27 NOTE — H&P (Signed)
History and Physical    Patient: Tristan [name] YME:[id number] DOB: [birth year] DOA: 02/27/2022 DOS: the patient was seen and examined on 02/27/2022 PCP: Pcp, No  Patient coming from: Home  Chief Complaint:  Chief Complaint  Patient presents with   Medical Clearance   Drug Problem   HPI: Tristan [name] is a 36 y.o. male with medical history significant of polysubstance abuse, bipolar d/o, Hepatitis C. Presenting with AMS. History is from chart review as the patient is drowsy and confused. He apparently was knocking on random doors in his neighborhood and hitting his head on the concrete. EMS was called. He apparently need versed for transport. At arrival to the ED, he remained agitated and altered. He was given ativan and geodon. [facility] was called for admission.    Review of Systems: unable to review all systems due to the inability of the patient to answer questions. Past Medical History:  Diagnosis Date   Bipolar 1 disorder (HCC)    Hepatitis C    Hypertension    PTSD (post-traumatic stress disorder)    Past Surgical History:  Procedure Laterality Date   NO PAST SURGERIES     Social History:  reports that he has been smoking cigarettes. He has been smoking an average of .5 packs per day. He has never used smokeless tobacco. He reports current alcohol use. He reports current drug use. Drugs: Marijuana, Heroin, Cocaine, Methamphetamines, and Morphine.  Allergies  Allergen Reactions   Tomato Itching and Other (See Comments)    Throat and tongue itches    Family History  Problem Relation Age of Onset   Other Father    Psychiatric Illness Father     Prior to Admission medications   Medication Sig Start Date End Date Taking? Authorizing Provider  escitalopram (LEXAPRO) 10 MG tablet Take 1 tablet (10 mg total) by mouth daily. 02/14/22 03/16/22  Comer Locket, MD  folic acid (FOLVITE) 1 MG tablet Take 1 tablet (1 mg total) by mouth daily. 02/23/22   Pokhrel,  Rebekah Chesterfield, MD  gabapentin (NEURONTIN) 100 MG capsule Take 1 capsule (100 mg total) by mouth 3 (three) times daily. 02/14/22 03/16/22  Comer Locket, MD  hydrOXYzine (ATARAX) 25 MG tablet Take 1 tablet (25 mg total) by mouth every 6 (six) hours as needed for anxiety. 02/22/22   Pokhrel, Rebekah Chesterfield, MD  loperamide (IMODIUM) 2 MG capsule Take 1 capsule (2 mg total) by mouth as needed for diarrhea or loose stools (diarrhea). 02/22/22   Pokhrel, Rebekah Chesterfield, MD  loratadine (CLARITIN) 10 MG tablet Take 10 mg by mouth daily as needed for allergies.    [provider]  methocarbamol (ROBAXIN) 500 MG tablet Take 1 tablet (500 mg total) by mouth every 8 (eight) hours as needed for muscle spasms. 02/22/22   Pokhrel, Rebekah Chesterfield, MD  Multiple Vitamin (MULTIVITAMIN WITH MINERALS) TABS tablet Take 1 tablet by mouth daily. 02/23/22   Pokhrel, Rebekah Chesterfield, MD  naproxen (NAPROSYN) 500 MG tablet Take 1 tablet (500 mg total) by mouth 2 (two) times daily as needed (aching, pain, or discomfort). 02/22/22   Pokhrel, Rebekah Chesterfield, MD  nicotine polacrilex (NICORETTE) 2 MG gum Take 1 each (2 mg total) by mouth as needed for smoking cessation. 02/14/22 03/16/22  Comer Locket, MD  pantoprazole (PROTONIX) 40 MG tablet Take 1 tablet (40 mg total) by mouth daily. 01/23/22   Karsten Ro, MD  QUEtiapine (SEROQUEL) 100 MG tablet Take 1 tablet (100 mg total) by mouth at bedtime. 02/14/22 03/16/22  Comer Locket, MD  thiamine 100 MG tablet Take 1 tablet (100 mg total) by mouth daily. 02/23/22   Joycelyn Das, MD    Physical Exam: Vitals:   02/27/22 0515 02/27/22 0530 02/27/22 0545 02/27/22 0600  BP: (!) 143/97 114/66 110/64   Pulse: 76 69 71   Resp: 18  18 16   Temp:      TempSrc:      SpO2: 99% 100% 100%    General: 36 y.o. male resting in bed in NAD Eyes: PERRL, normal sclera ENMT: Nares patent w/o discharge, orophaynx clear, dentition normal, ears w/o discharge/lesions/ulcers Neck: Supple, trachea midline Cardiovascular: RRR, +S1, S2, no m/g/r,  equal pulses throughout Respiratory: CTABL, no w/r/r, normal WOB GI: BS+, NDNT, no masses noted, no organomegaly noted MSK: No e/c/c Neuro: responsive to noxious stimuli; drowsy, incoherent at this time  Data Reviewed:  Na+  138 Glucose  77 Scr  1.41 AST  54 ALT  35 WBC  14 Hgb  9.6 MCV  76 EtOH < 10  CXR: No active disease  Assessment and Plan: Altered Mental Status     - admitted to inpt, SDU     - history of polysubstance abuse; recent admission to hospital for same that required intubation     - right now he is protecting his airway     - UDS pending, CXR negative     - he has an elevated WBC, but no fever     - will also check UA  AKI     - check renal 31     - give fluids, watch nephrotoxins  Bipolar d/o     - resume home regimen when confirmed and he is safe to take PO  Microcytic anemia     - looks like he is a little off his baseline, but no signs of bleed     - will check iron studies; trend Hgb  Polysubstance abuse     - UDS pending, follow   Advance Care Planning:   Code Status: Full Code  Consults: None  Family Communication: None at bedside  Severity of Illness: The appropriate patient status for this patient is INPATIENT. Inpatient status is judged to be reasonable and necessary in order to provide the required intensity of service to ensure the patient's safety. The patient's presenting symptoms, physical exam findings, and initial radiographic and laboratory data in the context of their chronic comorbidities is felt to place them at high risk for further clinical deterioration. Furthermore, it is not anticipated that the patient will be medically stable for discharge from the hospital within 2 midnights of admission.   * I certify that at the point of admission it is my clinical judgment that the patient will require inpatient hospital care spanning beyond 2 midnights from the point of admission due to high intensity of service, high risk for  further deterioration and high frequency of surveillance required.*  Author: [place], DO 02/27/2022 7:09 AM  For on call review [email or web address].

## 2022-02-27 NOTE — ED Notes (Signed)
UDS deferred due to pt asleep after medication administration. Provider notified

## 2022-02-27 NOTE — ED Notes (Signed)
Unable to assess pt for SI due to incomprehensible speech, and confusion

## 2022-02-27 NOTE — Progress Notes (Signed)
  Carryover admission to the Day Admitter.  I discussed this case with the EDP, Dr. Pilar Plate.  Per these discussions:  This is a 36 year old male with history of polysubstance abuse, is being admitted for acute encephalopathy, felt to be toxic in the etiology in the setting of polysubstance abuse, presenting to the ED this evening valuation of altered mental status and agitation.   The patient was recently hospitalized 02/18/2022 to 02/22/2022 for similar result to be acute toxic encephalopathy in the setting of polysubstance abuse, with associated urinary drug screen at that time reportedly associated with several positive findings, including that for amphetamine, , cocaine, benzodiazepines, marijuana.  During his previous hospitalization, there were some initial concerns regarding patient's ability to protect his airway, and he was subsequently intubated and admitted to the critical center service.  The patient and he was transferred to the hospitalist service and subsequently discharged on 02/22/22 before presenting back to the emergency department this evening with reportedly similar appearing confusion/agitation.   In the ED this evening, most recent blood pressures normotensive, per my discussions with the EDP, the patient is protecting his airway, and maintaining O2 sats in the range of 99 to 100% on room air.  He initially required soft bilateral wrist and ankle restraints as it was initially interfering with provision of medical care.  He initially received Geodon 20 mg IM x1 as well as 2 mg of IV Ativan and is reportedly much more calm now stable vital signs on par with the above.   Urinary drug screen result currently pending.  I have placed an order for admission for further evaluation and management of presenting acute encephalopathy.   I have placed some additional preliminary admit orders via the adult multi-morbid admission order set. I have also ordered n.p.o., urinalysis, VBG, continuous  lactated Ringer's.  Also placed an order for Geodon 10 mg IM once prn for agitation, with this ongoing necessity of this order to be reevaluated by admitting physician.     Newton Pigg, DO Hospitalist

## 2022-02-27 NOTE — ED Notes (Signed)
Placed pt on 4 L/M [place] due to SpO2% RA reading 80-85%. Pt now reading 92-96%

## 2022-02-27 NOTE — ED Provider Notes (Signed)
[facility]ENC[facility]PT [facility] Emergency Department Provider Note MRN:  [id number]  Arrival date & time: 02/27/22     Chief Complaint   Drug problem History of Present Illness   Tristan [name] is a 36 y.o. year-old male with a history of bipolar disorder, polysubstance use disorder presenting to the ED with chief complaint of drug problem.  Patient found delirious knocking on random doors in a neighborhood, was banging his head against the concrete pavement.  Required Versed for transport with EMS.  Patient is altered and does not respond to questions with any accuracy.  Review of Systems  I was unable to obtain a full/accurate HPI, PMH, or ROS due to the patient's altered mental status.  Patient's Health History    Past Medical History:  Diagnosis Date   Bipolar 1 disorder (HCC)    Hepatitis C    Hypertension    PTSD (post-traumatic stress disorder)     Past Surgical History:  Procedure Laterality Date   NO PAST SURGERIES      Family History  Problem Relation Age of Onset   Other Father    Psychiatric Illness Father     Social History   Socioeconomic History   Marital status: Single    Spouse name: Not on file   Number of children: Not on file   Years of education: Not on file   Highest education level: Not on file  Occupational History   Not on file  Tobacco Use   Smoking status: Every Day    Packs/day: 0.50    Types: Cigarettes   Smokeless tobacco: Never  Vaping Use   Vaping Use: Never used  Substance and Sexual Activity   Alcohol use: Yes    Comment: Pint per day   Drug use: Yes    Types: Marijuana, Heroin, Cocaine, Methamphetamines, Morphine    Comment: denies drug use   Sexual activity: Not Currently  Other Topics Concern   Not on file  Social History Narrative   ** Merged History Encounter **       Social Determinants of Health   Financial Resource Strain: Not on file  Food Insecurity: Not on file  Transportation Needs: Not  on file  Physical Activity: Not on file  Stress: Not on file  Social Connections: Not on file  Intimate Partner Violence: Not on file     Physical Exam   Vitals:   02/27/22 0545 02/27/22 0600  BP: 110/64   Pulse: 71   Resp: 18 16  Temp:    SpO2: 100%     CONSTITUTIONAL: Chronically ill-appearing, intermittently agitated NEURO/PSYCH: Random spontaneous phrases, movements, responds to voice and touch but erratically, moves all extremities EYES:  eyes equal and reactive ENT/NECK:  no LAD, no JVD CARDIO: Regular rate, well-perfused, normal S1 and S2 PULM:  CTAB no wheezing or rhonchi GI/GU:  non-distended, non-tender MSK/SPINE:  No gross deformities, no edema SKIN:  no rash, atraumatic   *Additional and/or pertinent findings included in MDM below  Diagnostic and Interventional Summary    EKG Interpretation  Date/Time:  Wednesday February 27 2022 01:07:27 EDT Ventricular Rate:  120 PR Interval:  154 QRS Duration: 88 QT Interval:  322 QTC Calculation: 455 R Axis:   83 Text Interpretation: Sinus tachycardia Probable left atrial enlargement Minimal ST depression, diffuse leads Confirmed by Kennis CarinaBero, Marya Lowden [phone]([id number]) on 02/27/2022 3:44:51 AM       Labs Reviewed  CBC - Abnormal; Notable for the following components:  Result Value   WBC 14.0 (*)    RBC 3.62 (*)    Hemoglobin 9.6 (*)    HCT 27.5 (*)    MCV 76.0 (*)    All other components within normal limits  COMPREHENSIVE METABOLIC PANEL - Abnormal; Notable for the following components:   Creatinine, Ser 1.41 (*)    Calcium 8.6 (*)    Total Protein 6.4 (*)    Albumin 3.3 (*)    AST 54 (*)    All other components within normal limits  ACETAMINOPHEN LEVEL - Abnormal; Notable for the following components:   Acetaminophen (Tylenol), Serum <10 (*)    All other components within normal limits  SALICYLATE LEVEL - Abnormal; Notable for the following components:   Salicylate Lvl <7.0 (*)    All other components within normal  limits  ETHANOL  RAPID URINE DRUG SCREEN, HOSP PERFORMED    No orders to display    Medications  sterile water (preservative free) injection (has no administration in time range)  etomidate (AMIDATE) injection 20 mg (0 mg Intravenous Hold 02/27/22 0409)  rocuronium bromide 10 mg/mL (PF) syringe (0 mg Intravenous Hold 02/27/22 0409)  acetaminophen (TYLENOL) tablet 650 mg (has no administration in time range)    Or  acetaminophen (TYLENOL) suppository 650 mg (has no administration in time range)  ziprasidone (GEODON) injection 10 mg (has no administration in time range)  sodium chloride 0.9 % bolus 1,000 mL (1,000 mLs Intravenous New Bag/Given 02/27/22 0153)  LORazepam (ATIVAN) injection 2 mg (2 mg Intravenous Given 02/27/22 0125)  ziprasidone (GEODON) injection 20 mg (20 mg Intramuscular Given 02/27/22 0220)     Procedures  /  Critical Care .Critical Care Performed by: Sabas Sous, MD Authorized by: Sabas Sous, MD   Critical care provider statement:    Critical care time (minutes):  35   Critical care was necessary to treat or prevent imminent or life-threatening deterioration of the following conditions:  Toxidrome   Critical care was time spent personally by me on the following activities:  Development of treatment plan with patient or surrogate, discussions with consultants, evaluation of patient's response to treatment, examination of patient, ordering and review of laboratory studies, ordering and review of radiographic studies, ordering and performing treatments and interventions, pulse oximetry, re-evaluation of patient's condition and review of old charts  ED Course and Medical Decision Making  Initial Impression and Ddx Patient recently presented to the emergency department with a similar presentation of delirium in the setting of suspected polysubstance use, he required intubation and ICU management for the delirium.  He required Versed in route, needed an additional 2 mg of IV  Ativan here in the emergency department to remain calm.  Was agitated, unable to stay still, fighting against restraints.  Will need to monitor closely for return of agitation, he has a small abrasion to his forehead but his altered mental status seems well explained by drug use, will consider CT head if any worsening of condition.  Awaiting laboratory assessment.  Past medical/surgical history that increases complexity of ED encounter: Polysubstance use  Interpretation of Diagnostics I personally reviewed the EKG and my interpretation is as follows: Sinus tachycardia  Labs are reassuring with no significant blood count or electrolyte disturbance.  Patient Reassessment and Ultimate Disposition/Management     Patient narrowly avoided intubation, had continued agitation and so respiratory was called and RSI medications were ordered.  It was at that time that the combination of Ativan and Geodon started  taking effect and patient fell asleep.  Has had reassuring hemodynamics and oxygenation.  Observed for several hours, not returning to cognitive baseline.  Suspect will need many more hours to fully metabolize.  Admitted to hospitalist service for further care.  Patient management required discussion with the following services or consulting groups:  Hospitalist Service  Complexity of Problems Addressed Acute illness or injury that poses threat of life of bodily function  Additional Data Reviewed and Analyzed Further history obtained from: EMS on arrival  Additional Factors Impacting ED Encounter Risk Use of parenteral controlled substances and Consideration of hospitalization  Elmer Sow. Pilar Plate, MD [facility] [email or web address]  Final Clinical Impressions(s) / ED Diagnoses     ICD-10-CM   1. Delirium  R41.0       ED Discharge Orders     None        Discharge Instructions Discussed with and Provided to Patient:   Discharge  Instructions   None      Sabas Sous, MD 02/27/22 [phone]

## 2022-02-27 NOTE — ED Triage Notes (Signed)
EMS was called because pt was knocking on random doors, hitting his head on the pavement and thrashing around the neighborhood

## 2022-02-27 NOTE — ED Triage Notes (Signed)
Pt bib by [place] [facility] and police. He was found knocking on doors banging his head on concrete. Pt received 5 mg versed IM via ems due to erratic behavior and harm to himself. Upon arrival pt room air sats was in 24s. Pt placed on 3 L O2. Pt arrived in restraints, so upon arrival non violent restraints were placed on pt. Pt alert but not oriented. Speech is incoherent

## 2022-02-27 NOTE — ED Notes (Signed)
Attempting to keep leads on pt. Pt still confused and thrashing at times. Cardiac electrodes and leads replaced multiple times

## 2022-02-27 NOTE — ED Triage Notes (Signed)
Pt got 500 cc of NS via ems, and 12 grams of D10 due to CBG of 67.

## 2022-02-27 NOTE — ED Notes (Signed)
Pt sleeping soundly. Respirations even and unlabored. Restraints removed from pt.  Monitoring equipment placed back on pt, VSS. Will continue to monitor.

## 2022-02-28 DIAGNOSIS — R4182 Altered mental status, unspecified: Secondary | ICD-10-CM | POA: Diagnosis not present

## 2022-02-28 LAB — COMPREHENSIVE METABOLIC PANEL
ALT: 37 U/L (ref 0–44)
AST: 51 U/L — ABNORMAL HIGH (ref 15–41)
Albumin: 3 g/dL — ABNORMAL LOW (ref 3.5–5.0)
Alkaline Phosphatase: 54 U/L (ref 38–126)
Anion gap: 7 (ref 5–15)
BUN: 13 mg/dL (ref 6–20)
CO2: 24 mmol/L (ref 22–32)
Calcium: 8.9 mg/dL (ref 8.9–10.3)
Chloride: 103 mmol/L (ref 98–111)
Creatinine, Ser: 0.85 mg/dL (ref 0.61–1.24)
GFR, Estimated: >60 mL/min (ref >60)
Glucose, Bld: 117 mg/dL — ABNORMAL HIGH (ref 70–99)
Potassium: 3.9 mmol/L (ref 3.5–5.1)
Sodium: 134 mmol/L — ABNORMAL LOW (ref 135–145)
Total Bilirubin: 0.4 mg/dL (ref 0.3–1.2)
Total Protein: 6.1 g/dL — ABNORMAL LOW (ref 6.5–8.1)

## 2022-02-28 LAB — CBC
HCT: 30.6 % — ABNORMAL LOW (ref 39.0–52.0)
Hemoglobin: 10.3 g/dL — ABNORMAL LOW (ref 13.0–17.0)
MCH: 25.8 pg — ABNORMAL LOW (ref 26.0–34.0)
MCHC: 33.7 g/dL (ref 30.0–36.0)
MCV: 76.5 fL — ABNORMAL LOW (ref 80.0–100.0)
Platelets: 384 10*3/uL (ref 150–400)
RBC: 4 MIL/uL — ABNORMAL LOW (ref 4.22–5.81)
RDW: 15.3 % (ref 11.5–15.5)
WBC: 6.8 10*3/uL (ref 4.0–10.5)
nRBC: 0 % (ref 0.0–0.2)

## 2022-02-28 MED ORDER — PANTOPRAZOLE SODIUM 40 MG PO TBEC
40.0000 mg | DELAYED_RELEASE_TABLET | Freq: Every day | ORAL | Status: DC
  Administered 2022-02-28: 40 mg via ORAL
  Filled 2022-02-28 (×2): qty 1

## 2022-02-28 MED ORDER — FOLIC ACID 1 MG PO TABS
1.0000 mg | ORAL_TABLET | Freq: Every day | ORAL | Status: DC
  Administered 2022-02-28: 1 mg via ORAL
  Filled 2022-02-28 (×2): qty 1

## 2022-02-28 MED ORDER — LOPERAMIDE HCL 2 MG PO CAPS: 2.0000 mg | ORAL_CAPSULE | ORAL | Status: DC | PRN

## 2022-02-28 MED ORDER — HYDROXYZINE HCL 25 MG PO TABS
25.0000 mg | ORAL_TABLET | Freq: Four times a day (QID) | ORAL | Status: DC | PRN
Start: 2022-02-28 — End: 2022-03-01
  Administered 2022-02-28: 25 mg via ORAL
  Filled 2022-02-28: qty 1

## 2022-02-28 MED ORDER — NAPROXEN 500 MG PO TABS: 500.0000 mg | ORAL_TABLET | Freq: Two times a day (BID) | ORAL | Status: DC | PRN

## 2022-02-28 MED ORDER — THIAMINE HCL 100 MG PO TABS
100.0000 mg | ORAL_TABLET | Freq: Every day | ORAL | Status: DC
  Administered 2022-02-28: 100 mg via ORAL
  Filled 2022-02-28 (×2): qty 1

## 2022-02-28 MED ORDER — METHOCARBAMOL 500 MG PO TABS
500.0000 mg | ORAL_TABLET | Freq: Once | ORAL | Status: AC
  Administered 2022-02-28: 500 mg via ORAL
  Filled 2022-02-28: qty 1

## 2022-02-28 MED ORDER — NICOTINE POLACRILEX 2 MG MT GUM: 2.0000 mg | CHEWING_GUM | OROMUCOSAL | Status: DC | PRN

## 2022-02-28 MED ORDER — ADULT MULTIVITAMIN W/MINERALS CH
1.0000 | ORAL_TABLET | Freq: Every day | ORAL | Status: DC
Start: 2022-02-28 — End: 2022-03-01
  Administered 2022-02-28: 1 via ORAL
  Filled 2022-02-28 (×2): qty 1

## 2022-02-28 MED ORDER — QUETIAPINE FUMARATE 100 MG PO TABS
100.0000 mg | ORAL_TABLET | Freq: Every day | ORAL | Status: DC
  Administered 2022-02-28: 100 mg via ORAL
  Filled 2022-02-28: qty 1

## 2022-02-28 MED ORDER — LORATADINE 10 MG PO TABS: 10.0000 mg | ORAL_TABLET | Freq: Every day | ORAL | Status: DC | PRN

## 2022-02-28 MED ORDER — ESCITALOPRAM OXALATE 10 MG PO TABS
10.0000 mg | ORAL_TABLET | Freq: Every day | ORAL | Status: DC
  Administered 2022-02-28: 10 mg via ORAL
  Filled 2022-02-28 (×2): qty 1

## 2022-02-28 MED ORDER — GABAPENTIN 100 MG PO CAPS
100.0000 mg | ORAL_CAPSULE | Freq: Three times a day (TID) | ORAL | Status: DC
  Administered 2022-02-28 (×2): 100 mg via ORAL
  Filled 2022-02-28 (×3): qty 1

## 2022-02-28 MED ORDER — METHOCARBAMOL 500 MG PO TABS: 500.0000 mg | ORAL_TABLET | Freq: Three times a day (TID) | ORAL | Status: DC | PRN

## 2022-02-28 NOTE — Progress Notes (Signed)
Patient's demeanor is appropriate and pleasant. Sitter is not indicated at this time. D/c order

## 2022-02-28 NOTE — Discharge Summary (Signed)
Physician Discharge Summary  Tristan [name] [id number] DOB: Nov 12, 1985 DOA: 02/27/2022  PCP: Pcp, No  Admit date: 02/27/2022 Discharge date: 03/01/2022 Recommendations for Outpatient Follow-up:  Follow up with PCP in 1 weeks-call for appointment Please obtain BMP/CBC in one week  Discharge Dispo: home Discharge Condition: Stable Code Status:   Code Status: Full Code Diet recommendation:  Diet Order             Diet regular Room service appropriate? Yes; Fluid consistency: Thin  Diet effective now                    Brief/Interim Summary: 36 y.o. male with medical history significant of polysubstance abuse, bipolar d/o, Hepatitis C. Presenting with AMS. History is from chart review as the patient is drowsy and confused. He apparently was knocking on random doors in his neighborhood and hitting his head on the concrete. EMS was called. He apparently need versed for transport. At arrival to the ED, he remained agitated and altered. He was given ativan and geodon. [facility] was called for admission. He was monitored overnight- mentation back to normal. Psych consulted.  Patient mentation has stabilized and improved at this time alert awake oriented x3, seen by psychiatry and cleared.  He is requesting for discharge today.    Discharge Diagnoses:  Principal Problem:   Altered mental status Active Problems:   Bipolar 1 disorder, mixed (HCC)   Polysubstance abuse (HCC)   AKI (acute kidney injury) ([place])   Microcytic anemia  Altered mental status: Likely toxic metabolic encephalopathy in the setting of polysubstance abuse, chest x-ray negative mild leukocytosis but no fever and leukocytosis resolved.  UA no evidence of UTI.  Patient is alert and oriented x3.  UDS positive for amphetamine, benzodiazepine and cocaine.  Consulted psychiatry-watch for withdrawal- consider detox regimen.For now will resume home meds,lexapro, neurontin, seroquel bedtime, thiamine.folate.He needs to follow  up with [facility] substance abuse program or [facility].  Mental status is stable and improved,   Bipolar 1 disorder, mixed CONT ON Home meds   Polysubstance abuse : See 1, TOC consulted to help with resources cessation counseling.  Seen by psychiatry. AKI-in the setting of #1 likely volume depletion prerenal.  Resolved with IV fluids, renal ultrasound unremarkable   Microcytic anemia-hemoglobin stable 10.3 g improving, iron 24.  Iron supplement outpatient follow-up.  Consults: Psychiatric Subjective: Alert awake oriented x3, ambulating well.  Denies any complaint.  Discharge Exam: Vitals:   03/01/22 0147 03/01/22 0536  BP: 122/73 127/67  Pulse: 84 60  Resp: 18 16  Temp: 98.2 F (36.8 C) 97.9 F (36.6 C)  SpO2: 100% 98%   General: Pt is alert, awake, not in acute distress Cardiovascular: RRR, S1/S2 +, no rubs, no gallops Respiratory: CTA bilaterally, no wheezing, no rhonchi Abdominal: Soft, NT, ND, bowel sounds + Extremities: no edema, no cyanosis  Discharge Instructions  Discharge Instructions     Discharge instructions   Complete by: As directed    Please call call MD or return to ER for similar or worsening recurring problem that brought you to hospital or if any fever,nausea/vomiting,abdominal pain, uncontrolled pain, chest pain,  shortness of breath or any other alarming symptoms.  Please follow-up your doctor as instructed in a week time and call the office for appointment.  Please avoid alcohol, smoking, or any other illicit substance and maintain healthy habits including taking your regular medications as prescribed.  You were cared for by a hospitalist during your hospital stay. If you  have any questions about your discharge medications or the care you received while you were in the hospital after you are discharged, you can call the unit and ask to speak with the hospitalist on call if the hospitalist that took care of you is not available.  Once you are discharged,  your primary care physician will handle any further medical issues. Please note that NO REFILLS for any discharge medications will be authorized once you are discharged, as it is imperative that you return to your primary care physician (or establish a relationship with a primary care physician if you do not have one) for your aftercare needs so that they can reassess your need for medications and monitor your lab values   Increase activity slowly   Complete by: As directed    No wound care   Complete by: As directed       Allergies as of 03/01/2022       Reactions   Tomato Itching, Other (See Comments)   Throat and tongue itches        Medication List     TAKE these medications    escitalopram 10 MG tablet Commonly known as: LEXAPRO Take 1 tablet (10 mg total) by mouth daily.   folic acid 1 MG tablet Commonly known as: FOLVITE Take 1 tablet (1 mg total) by mouth daily.   gabapentin 100 MG capsule Commonly known as: NEURONTIN Take 1 capsule (100 mg total) by mouth 3 (three) times daily.   hydrOXYzine 25 MG tablet Commonly known as: ATARAX Take 1 tablet (25 mg total) by mouth every 6 (six) hours as needed for anxiety.   loperamide 2 MG capsule Commonly known as: IMODIUM Take 1 capsule (2 mg total) by mouth as needed for diarrhea or loose stools (diarrhea).   loratadine 10 MG tablet Commonly known as: CLARITIN Take 10 mg by mouth daily as needed for allergies.   methocarbamol 500 MG tablet Commonly known as: ROBAXIN Take 1 tablet (500 mg total) by mouth every 8 (eight) hours as needed for muscle spasms.   multivitamin with minerals Tabs tablet Take 1 tablet by mouth daily.   naproxen 500 MG tablet Commonly known as: NAPROSYN Take 1 tablet (500 mg total) by mouth 2 (two) times daily as needed (aching, pain, or discomfort).   nicotine polacrilex 2 MG gum Commonly known as: NICORETTE Take 1 each (2 mg total) by mouth as needed for smoking cessation.    pantoprazole 40 MG tablet Commonly known as: PROTONIX Take 1 tablet (40 mg total) by mouth daily.   QUEtiapine 100 MG tablet Commonly known as: SEROQUEL Take 1 tablet (100 mg total) by mouth at bedtime.   thiamine 100 MG tablet Take 1 tablet (100 mg total) by mouth daily.        Follow-up [facility] Follow up in 1 week(s).   Contact information: [place] [phone]               Allergies  Allergen Reactions   Tomato Itching and Other (See Comments)    Throat and tongue itches    The results of significant diagnostics from this hospitalization (including imaging, microbiology, ancillary and laboratory) are listed below for reference.    Microbiology: Recent Results (from the past 240 hour(s))  MRSA Next Gen by PCR, Nasal     Status: None   Collection Time: 02/27/22  9:09 PM  Specimen: Nasal Mucosa; Nasal Swab  Result Value Ref Range Status   MRSA by PCR Next Gen NOT DETECTED NOT DETECTED Final    Comment: (NOTE) The GeneXpert MRSA Assay (FDA approved for NASAL specimens only), is one component of a comprehensive MRSA colonization surveillance program. It is not intended to diagnose MRSA infection nor to guide or monitor treatment for MRSA infections. Test performance is not FDA approved in patients less than 22 years old. Performed at [facility], [place] [address] [place], [place] [address]     Procedures/Studies: US RENAL  Result Date: 02/27/2022 CLINICAL DATA:  Acute kidney injury EXAM: RENAL / URINARY TRACT ULTRASOUND COMPLETE COMPARISON:  CT scan 02/18/2022. FINDINGS: Right Kidney: Renal measurements: 10.6 x 5.7 x 6.2 cm = volume: 205 mL. Echogenicity is mildly increased. No mass or hydronephrosis visualized. Left Kidney: Renal measurements: 11.4 x 7.1 x 6.6 cm = volume: 276 mL. Echogenicity is mildly increased. No mass or  hydronephrosis visualized. Bladder: Appears normal for degree of bladder distention. Other: None. IMPRESSION: No hydronephrosis. Mildly increased renal echogenicity compatible with medical renal disease. Electronically Signed   By: Misty Stanley M.D.   On: 02/27/2022 10:53   DG CHEST PORT 1 VIEW  Result Date: 02/27/2022 CLINICAL DATA:  Altered mental status EXAM: PORTABLE CHEST 1 VIEW COMPARISON:  02/19/2022 FINDINGS: Previously seen lines and tubes are absent. The lungs are clear. No effusions. Heart size is normal. IMPRESSION: No active disease. Electronically Signed   By: Nelson Chimes M.D.   On: 02/27/2022 07:22   DG Chest Port 1 View  Result Date: 02/19/2022 CLINICAL DATA:  Ventilator dependent respiratory failure. EXAM: PORTABLE CHEST 1 VIEW COMPARISON:  Chest CT yesterday at 4:48 p.m. and portable chest yesterday at 4:20 p.m. FINDINGS: 4:51 a.m. 02/19/2022.  ETT tip is 3.7 cm over the carina. NGT enters the stomach with proximal side hole just below the hiatus and could be advanced further in 10 cm for better positioning. The cardiac size is normal. The mediastinal configuration is unremarkable. The lungs are hyperinflated. There is increasing opacity in the medial right base consistent with atelectasis or consolidation. There is increased left infrahilar streaky opacity which could be atelectasis or infiltrates. The remaining lungs are clear. There are trace pleural effusions. In all other respects no further changes. IMPRESSION: 1. Increased opacity medial right base consistent with atelectasis or consolidation. 2. Increased streaky opacity in the left infrahilar area consistent with atelectasis or infiltrates. 3. Support tubes as above. Electronically Signed   By: Telford Nab M.D.   On: 02/19/2022 07:15   CT CHEST ABDOMEN PELVIS WO CONTRAST  Result Date: 02/18/2022 CLINICAL DATA:  Blunt chest trauma. Patient found lying in the street. Combative. Had to be intubated. EXAM: CT CHEST, ABDOMEN AND  PELVIS WITHOUT CONTRAST TECHNIQUE: Multidetector CT imaging of the chest, abdomen and pelvis was performed following the standard protocol without IV contrast. RADIATION DOSE REDUCTION: This exam was performed according to the departmental dose-optimization program which includes automated exposure control, adjustment of the mA and/or kV according to patient size and/or use of iterative reconstruction technique. COMPARISON:  AP chest 02/18/2022, CT abdomen and pelvis without contrast 01/27/2022 FINDINGS: CT CHEST FINDINGS Cardiovascular: Heart size is normal. No pericardial effusion. No thoracic aortic aneurysm. Mediastinum/Nodes: No axillary, mediastinal, or hilar pathologically enlarged lymph nodes by CT criteria. The visualized thyroid is grossly unremarkable. An enteric tube is seen throughout the esophagus extending into the stomach with the tip overlying the mid stomach. Lungs/Pleura:  Endotracheal tube is seen within the trachea with the tip terminating approximately 3.0 cm above the carina. There are mild curvilinear bubbly lucencies around the endotracheal tube within the trachea. There is opacification of the posterior segment of the right lower lobe airway (axial series 5, images 107-113, possibly from mucous plugging/secretion) otherwise, the central airways are patent. Mild bilateral lower lobe dependent subsegmental atelectasis. It is difficult to exclude trace pleural effusions. Mild biapical paraseptal cystic emphysematous changes. No focal airspace opacity to indicate pneumonia. No pneumothorax. Musculoskeletal: Normal thoracic spine alignment. Vertebral body heights and intervertebral disc spaces are maintained. No acute fracture is seen. CT ABDOMEN PELVIS FINDINGS Lack of intra-articular fluid limits evaluation of the abdominal and pelvic organ parenchyma. The following findings are made within this limitation. Hepatobiliary: Smooth liver contours. No gross liver lesion is seen. The gallbladder is  grossly unremarkable. Pancreas: Grossly unremarkable. No gross pancreatic ductal dilatation or surrounding inflammatory changes. Spleen: Normal in size without focal abnormality. Unchanged small splenule in between the spleen and upper pole of the left kidney (axial image 64 and coronal image 67). Adrenals/Urinary Tract: Normal adrenals. No renal stone or hydronephrosis. Within the limitations of lack of IV contrast, no contour deforming renal mass. The urinary bladder is grossly unremarkable. Stomach/Bowel: No bowel wall thickening. The terminal ileum is unremarkable. The appendix is within normal limits. No dilated loops of bowel to indicate bowel obstruction. Vascular/Lymphatic: No abdominal aortic aneurysm. No enlarged abdominal or pelvic lymph nodes. Reproductive: Prostate is unremarkable. Other: No abdominal wall hernia. No free air or free fluid. Musculoskeletal: Normal 2 lumbar spine alignment. No acute fracture is seen. IMPRESSION: 1. No noncontrast CT evidence of posttraumatic abnormality within the chest, abdomen, or pelvis. 2. Endotracheal tube and enteric tube in appropriate position. 3. Mild bubbly secretions within the trachea and likely secretions narrowing the posterior segment of the right lower lobe bronchus. Mild bilateral posterior dependent subsegmental atelectasis. It is difficult to exclude tiny pleural effusions. Electronically Signed   By: Yvonne Kendall M.D.   On: 02/18/2022 17:31   CT Head Wo Contrast  Result Date: 02/18/2022 CLINICAL DATA:  Found down, combative EXAM: CT HEAD WITHOUT CONTRAST CT CERVICAL SPINE WITHOUT CONTRAST TECHNIQUE: Multidetector CT imaging of the head and cervical spine was performed following the standard protocol without intravenous contrast. Multiplanar CT image reconstructions of the cervical spine were also generated. RADIATION DOSE REDUCTION: This exam was performed according to the departmental dose-optimization program which includes automated exposure  control, adjustment of the mA and/or kV according to patient size and/or use of iterative reconstruction technique. COMPARISON:  02/05/2022 FINDINGS: CT HEAD FINDINGS Brain: No acute infarct or hemorrhage. Lateral ventricles and midline structures are unremarkable. No acute extra-axial fluid collections. No mass effect. Vascular: No hyperdense vessel or unexpected calcification. Skull: Normal. Negative for fracture or focal lesion. Sinuses/Orbits: No acute finding. Other: None. CT CERVICAL SPINE FINDINGS Alignment: Alignment is anatomic. Skull base and vertebrae: No acute fracture. No primary bone lesion or focal pathologic process. Soft tissues and spinal canal: No prevertebral fluid or swelling. No visible canal hematoma. Disc levels:  No significant spondylosis or facet hypertrophy. Upper chest: Patient is intubated. Distal margins of the endotracheal tube and enteric catheter are excluded by slice selection. Mild emphysema at the lung apices. Other: Reconstructed images demonstrate no additional findings. IMPRESSION: 1. No acute intracranial process. 2. No acute cervical spine fracture. Electronically Signed   By: Randa Ngo M.D.   On: 02/18/2022 17:18   CT Cervical Spine  Wo Contrast  Result Date: 02/18/2022 CLINICAL DATA:  Found down, combative EXAM: CT HEAD WITHOUT CONTRAST CT CERVICAL SPINE WITHOUT CONTRAST TECHNIQUE: Multidetector CT imaging of the head and cervical spine was performed following the standard protocol without intravenous contrast. Multiplanar CT image reconstructions of the cervical spine were also generated. RADIATION DOSE REDUCTION: This exam was performed according to the departmental dose-optimization program which includes automated exposure control, adjustment of the mA and/or kV according to patient size and/or use of iterative reconstruction technique. COMPARISON:  02/05/2022 FINDINGS: CT HEAD FINDINGS Brain: No acute infarct or hemorrhage. Lateral ventricles and midline  structures are unremarkable. No acute extra-axial fluid collections. No mass effect. Vascular: No hyperdense vessel or unexpected calcification. Skull: Normal. Negative for fracture or focal lesion. Sinuses/Orbits: No acute finding. Other: None. CT CERVICAL SPINE FINDINGS Alignment: Alignment is anatomic. Skull base and vertebrae: No acute fracture. No primary bone lesion or focal pathologic process. Soft tissues and spinal canal: No prevertebral fluid or swelling. No visible canal hematoma. Disc levels:  No significant spondylosis or facet hypertrophy. Upper chest: Patient is intubated. Distal margins of the endotracheal tube and enteric catheter are excluded by slice selection. Mild emphysema at the lung apices. Other: Reconstructed images demonstrate no additional findings. IMPRESSION: 1. No acute intracranial process. 2. No acute cervical spine fracture. Electronically Signed   By: Randa Ngo M.D.   On: 02/18/2022 17:18   DG Chest Portable 1 View  Result Date: 02/18/2022 CLINICAL DATA:  Intubation. EXAM: PORTABLE CHEST 1 VIEW COMPARISON:  AP chest 02/18/2022 at 1449 hours. FINDINGS: New endotracheal tube tip terminates approximately 4 cm above the carina in between the thoracic inlet and the carina. Enteric tube descends below the diaphragm with the side port overlying the left upper abdominal quadrant in the tip excluded by collimation. Cardiac silhouette and mediastinal contours are within normal limits. The lungs are clear. No pleural effusion or pneumothorax. IMPRESSION: 1. New endotracheal tube and enteric tube in appropriate position. 2. No acute lung process. Electronically Signed   By: Yvonne Kendall M.D.   On: 02/18/2022 16:53   DG Chest Port 1 View  Result Date: 02/18/2022 CLINICAL DATA:  Patient found in the street rolling around, acting belligerent. Combative. EXAM: PORTABLE CHEST 1 VIEW COMPARISON:  02/14/2022 FINDINGS: Cardiac silhouette is normal in size and configuration. Normal  mediastinal and hilar contours. Clear lungs.  No pleural effusion or pneumothorax. Skeletal structures are grossly intact. IMPRESSION: No active disease. Electronically Signed   By: Lajean Manes M.D.   On: 02/18/2022 15:24   DG Chest Port 1 View  Result Date: 02/14/2022 CLINICAL DATA:  Overdose. EXAM: PORTABLE CHEST 1 VIEW COMPARISON:  Chest x-ray 02/10/2022. FINDINGS: The heart size and mediastinal contours are within normal limits. Both lungs are clear. The visualized skeletal structures are unremarkable. IMPRESSION: No active disease. Electronically Signed   By: Ronney Asters M.D.   On: 02/14/2022 20:47   VAS [place] LOWER EXTREMITY VENOUS (DVT) (ONLY [facility] & [facility])  Result Date: 02/11/2022  Lower Venous DVT Study Patient Name:  DARIEN NORWICK  Date of Exam:   02/10/2022 Medical Rec #: [id number]                  Accession #:    [id number] Date of Birth: [birth year]                  Patient Gender: M Patient Age:   30 years Exam Location[facility]  [facility] Procedure:  VAS [place] LOWER EXTREMITY VENOUS (DVT) Referring Phys: PETER MESSICK --------------------------------------------------------------------------------  Indications: Pain, and Swelling.  Risk Factors: Polysubstance abuse, Hepatitis C. Comparison Study: No prior study on file Performing Technologist: Sharion Dove RVS  Examination Guidelines: A complete evaluation includes B-mode imaging, spectral Doppler, color Doppler, and power Doppler as needed of all accessible portions of each vessel. Bilateral testing is considered an integral part of a complete examination. Limited examinations for reoccurring indications may be performed as noted. The reflux portion of the exam is performed with the patient in reverse Trendelenburg.  +--------+---------------+---------+-----------+----------------+-------------+ RIGHT   CompressibilityPhasicitySpontaneityProperties      Thrombus                                                                  Aging         +--------+---------------+---------+-----------+----------------+-------------+ CFV     Full                               pulsatile                                                                waveforms                     +--------+---------------+---------+-----------+----------------+-------------+ SFJ     Full                                                             +--------+---------------+---------+-----------+----------------+-------------+ FV Prox Full                                                             +--------+---------------+---------+-----------+----------------+-------------+ FV Mid  Full                                                             +--------+---------------+---------+-----------+----------------+-------------+ FV      Full                                                             Distal                                                                   +--------+---------------+---------+-----------+----------------+-------------+  PFV     Full                                                             +--------+---------------+---------+-----------+----------------+-------------+ POP     Full                               pulsatile                                                                waveforms                     +--------+---------------+---------+-----------+----------------+-------------+ PTV     Full                                                             +--------+---------------+---------+-----------+----------------+-------------+ PERO    Full                                                             +--------+---------------+---------+-----------+----------------+-------------+   +--------+---------------+---------+-----------+----------------+-------------+ LEFT    CompressibilityPhasicitySpontaneityProperties      Thrombus                                                                  Aging         +--------+---------------+---------+-----------+----------------+-------------+ CFV     Full                               pulsatile                                                                waveforms                     +--------+---------------+---------+-----------+----------------+-------------+ SFJ     Full                                                             +--------+---------------+---------+-----------+----------------+-------------+  FV Prox Full                                                             +--------+---------------+---------+-----------+----------------+-------------+ FV Mid  Full                                                             +--------+---------------+---------+-----------+----------------+-------------+ FV      Full                                                             Distal                                                                   +--------+---------------+---------+-----------+----------------+-------------+ PFV     Full                                                             +--------+---------------+---------+-----------+----------------+-------------+ POP     Full                               pulsatile                                                                waveforms                     +--------+---------------+---------+-----------+----------------+-------------+ PTV     Full                                                             +--------+---------------+---------+-----------+----------------+-------------+ PERO    Full                                                             +--------+---------------+---------+-----------+----------------+-------------+     Summary: BILATERAL: - No evidence of deep vein thrombosis seen  in the lower extremities, bilaterally. -No evidence  of popliteal cyst, bilaterally. RIGHT: - Ultrasound characteristics of enlarged lymph nodes are noted in the groin. pulsatile waveforms suggestive of fluid overload  LEFT: - Ultrasound characteristics of enlarged lymph nodes noted in the groin. pulsatile waveforms suggestive of fluid overload.  *See table(s) above for measurements and observations. Electronically signed by Monica Martinez MD on 02/11/2022 at 5:13:28 PM.    Final    DG Chest 2 View  Result Date: 02/10/2022 CLINICAL DATA:  36 year old male with history of bilateral leg pain and swelling. EXAM: CHEST - 2 VIEW COMPARISON:  Chest x-ray 02/05/2022. FINDINGS: Lung volumes are normal. No consolidative airspace disease. No pleural effusions. No pneumothorax. No pulmonary nodule or mass noted. Pulmonary vasculature and the cardiomediastinal silhouette are within normal limits. IMPRESSION: No radiographic evidence of acute cardiopulmonary disease. Electronically Signed   By: Vinnie Langton M.D.   On: 02/10/2022 05:47   CT Head Wo Contrast  Result Date: 02/05/2022 CLINICAL DATA:  Delirium. EXAM: CT HEAD WITHOUT CONTRAST TECHNIQUE: Contiguous axial images were obtained from the base of the skull through the vertex without intravenous contrast. RADIATION DOSE REDUCTION: This exam was performed according to the departmental dose-optimization program which includes automated exposure control, adjustment of the mA and/or kV according to patient size and/or use of iterative reconstruction technique. COMPARISON:  Head CT dated 10/16/2019. FINDINGS: Brain: The ventricles and sulci are appropriate size for the patient's age. The gray-white matter discrimination is preserved. There is no acute intracranial hemorrhage. No mass effect or midline shift. No extra-axial fluid collection. Vascular: No hyperdense vessel or unexpected calcification. Skull: Normal. Negative for fracture or focal lesion. Sinuses/Orbits: Age indeterminate mildly depressed fracture of the  left nasal bone. Correlation with clinical exam and point tenderness recommended. The visualized paranasal sinuses and mastoid air cells are otherwise clear. Other: None IMPRESSION: 1. No acute intracranial pathology. 2. Age indeterminate mildly depressed fracture of the left nasal bone. Correlation with clinical exam and point tenderness recommended. Electronically Signed   By: Anner Crete M.D.   On: 02/05/2022 20:27   DG Chest Portable 1 View  Result Date: 02/05/2022 CLINICAL DATA:  Altered mental status, recent trauma, initial encounter EXAM: PORTABLE CHEST 1 VIEW COMPARISON:  10/31/2018 FINDINGS: Cardiac shadow is prominent but accentuated by the portable technique. No focal infiltrate or effusion is seen. No bony abnormality is noted. IMPRESSION: No acute abnormality seen Electronically Signed   By: Inez Catalina M.D.   On: 02/05/2022 20:01    Labs: BNP (last 3 results) Recent Labs    02/10/22 0441  BNP [id number]   Basic Metabolic Panel: Recent Labs  Lab 02/27/22 0127 02/28/22 0255  NA 138 134*  K 4.0 3.9  CL 107 103  CO2 24 24  GLUCOSE 77 117*  BUN 15 13  CREATININE 1.41* 0.85  CALCIUM 8.6* 8.9   Liver Function Tests: Recent Labs  Lab 02/27/22 0127 02/28/22 0255  AST 54* 51*  ALT 35 37  ALKPHOS 57 54  BILITOT 0.8 0.4  PROT 6.4* 6.1*  ALBUMIN 3.3* 3.0*   No results for input(s): "LIPASE", "AMYLASE" in the last 168 hours. No results for input(s): "AMMONIA" in the last 168 hours. CBC: Recent Labs  Lab 02/27/22 0127 02/28/22 0255  WBC 14.0* 6.8  HGB 9.6* 10.3*  HCT 27.5* 30.6*  MCV 76.0* 76.5*  PLT 350 384   Cardiac Enzymes: No results for input(s): "CKTOTAL", "CKMB", "CKMBINDEX", "TROPONINI" in the last 168 hours. BNP: Invalid  input(s): "POCBNP" CBG: No results for input(s): "GLUCAP" in the last 168 hours. D-Dimer No results for input(s): "DDIMER" in the last 72 hours. Hgb A1c No results for input(s): "HGBA1C" in the last 72 hours. Lipid Profile No  results for input(s): "CHOL", "HDL", "LDLCALC", "TRIG", "CHOLHDL", "LDLDIRECT" in the last 72 hours. Thyroid function studies No results for input(s): "TSH", "T4TOTAL", "T3FREE", "THYROIDAB" in the last 72 hours.  Invalid input(s): "FREET3" Anemia work up Recent Labs    02/27/22 0852  TIBC 351  IRON 24*   Urinalysis    Component Value Date/Time   COLORURINE YELLOW 02/27/2022 0628   APPEARANCEUR HAZY (A) 02/27/2022 0628   LABSPEC 1.012 02/27/2022 0628   PHURINE 5.0 02/27/2022 0628   GLUCOSEU NEGATIVE 02/27/2022 0628   HGBUR NEGATIVE 02/27/2022 0628   BILIRUBINUR NEGATIVE 02/27/2022 0628   KETONESUR 5 (A) 02/27/2022 0628   PROTEINUR NEGATIVE 02/27/2022 0628   UROBILINOGEN 1.0 05/02/2014 0956   NITRITE NEGATIVE 02/27/2022 0628   LEUKOCYTESUR NEGATIVE 02/27/2022 0628   Sepsis Labs Recent Labs  Lab 02/27/22 0127 02/28/22 0255  WBC 14.0* 6.8   Microbiology Recent Results (from the past 240 hour(s))  MRSA Next Gen by PCR, Nasal     Status: None   Collection Time: 02/27/22  9:09 PM   Specimen: Nasal Mucosa; Nasal Swab  Result Value Ref Range Status   MRSA by PCR Next Gen NOT DETECTED NOT DETECTED Final    Comment: (NOTE) The GeneXpert MRSA Assay (FDA approved for NASAL specimens only), is one component of a comprehensive MRSA colonization surveillance program. It is not intended to diagnose MRSA infection nor to guide or monitor treatment for MRSA infections. Test performance is not FDA approved in patients less than 60 years old. Performed at [facility], [place] [address], [place], [place] [address]      Time coordinating discharge: 25 minutes  SIGNED: Antonieta Pert, MD  [facility] 03/01/2022, 10:23 AM  If 7PM-7AM, please contact night-coverage [email or web address]

## 2022-02-28 NOTE — Hospital Course (Addendum)
36 y.o. male with medical history significant of polysubstance abuse, bipolar d/o, Hepatitis C. Presenting with AMS. History is from chart review as the patient is drowsy and confused. He apparently was knocking on random doors in his neighborhood and hitting his head on the concrete. EMS was called. He apparently need versed for transport. At arrival to the ED, he remained agitated and altered. He was given ativan and geodon. TRH was called for admission. He was monitored overnight- mentation back to normal. Psych consulted.

## 2022-02-28 NOTE — Progress Notes (Signed)
PROGRESS NOTE Tristan [name]  NGE:[id number] DOB: [birth year] DOA: 02/27/2022 PCP: Pcp, No   Brief Narrative/Hospital Course: 36 y.o. male with medical history significant of polysubstance abuse, bipolar d/o, Hepatitis C. Presenting with AMS. History is from chart review as the patient is drowsy and confused. He apparently was knocking on random doors in his neighborhood and hitting his head on the concrete. EMS was called. He apparently need versed for transport. At arrival to the ED, he remained agitated and altered. He was given ativan and geodon. TRH was called for admission. He was monitored overnight- mentation back to normal. Psych consulted.    Subjective: Seen and examined this morning.  Alert awake oriented x3, reports he feels "hot and cold" Reports she was shaking and could not control his body yesterday something happened previously, no loss of consciousness. Reports whole body is hurting. Assessment and Plan: Principal Problem:   Altered mental status Active Problems:   Bipolar 1 disorder, mixed (HCC)   Polysubstance abuse (HCC)   AKI (acute kidney injury) (HCC)   Microcytic anemia   Altered mental status: Likely toxic metabolic encephalopathy in the setting of polysubstance abuse, chest x-ray negative mild leukocytosis but no fever and leukocytosis resolved.  UA no evidence of UTI.  Patient is alert and oriented x3.  UDS positive for amphetamine, benzodiazepine and cocaine.  Consulted psychiatry-watch for withdrawal- consider detox regimen.For now will resume home meds,lexapro, neurontin, seroquel bedtime, thiamine.folate.He needs to follow up with [facility] substance abuse program or [facility].  Bipolar 1 disorder, mixed resume home meds  Polysubstance abuse : See 1, TOC consult to help with resources cessation counseling. Psyhc consulted.  AKI-in the setting of #1 likely volume depletion prerenal.  Resolved with IV fluids, renal ultrasound unremarkable  Microcytic  anemia-hemoglobin stable 10.3 g improving, iron 24.  Iron supplement outpatient follow-up. He is thinking of leaving AMA- advised not to leave AMA. Rn explained risks/benefits.   DVT prophylaxis: SCDs Start: 02/27/22 4010 Code Status:   Code Status: Full Code Family Communication: plan of care discussed with patientat bedside. Patient status is: inpatient  because of ongoing management of substance abuse Level of care: Stepdown  transfer to med surg.  Dispo: The patient is from: home            Anticipated disposition: home  Mobility Assessment (last 72 hours)     Mobility Assessment     Row Name 02/27/22 2100           Does patient have an order for bedrest or is patient medically unstable No - Continue assessment       What is the highest level of mobility based on the progressive mobility assessment? Level 5 (Walks with assist in room/hall) - Balance while stepping forward/back and can walk in room with assist - Complete                 Objective: Vitals last 24 hrs: Vitals:   02/28/22 0437 02/28/22 0439 02/28/22 0805 02/28/22 1130  BP:  (!) 141/84    Pulse: 69 61    Resp: 14 13    Temp: 98.1 F (36.7 C)  98 F (36.7 C) 98.8 F (37.1 C)  TempSrc: Oral  Oral Oral  SpO2: 100% 100%    Weight:      Height:       Weight change:   Physical Examination: General exam: alert awake,older than stated age, weak appearing. HEENT:Oral mucosa moist, Ear/Nose WNL grossly, dentition normal. Respiratory system: bilaterally  diminished BS, no use of accessory muscle Cardiovascular system: S1 & S2 +, No JVD. Gastrointestinal system: Abdomen soft,NT,ND, BS+ Nervous System:Alert, awake, moving extremities and grossly nonfocal Extremities: LE edema neg,distal peripheral pulses palpable.  Skin: No rashes,no icterus. MSK: Normal muscle bulk,tone, power  Medications reviewed:  Scheduled Meds:  Chlorhexidine Gluconate Cloth  6 each Topical Q0600   escitalopram  10 mg Oral Daily    folic acid  1 mg Oral Daily   gabapentin  100 mg Oral TID   multivitamin with minerals  1 tablet Oral Daily   pantoprazole  40 mg Oral Daily   QUEtiapine  100 mg Oral QHS   thiamine  100 mg Oral Daily   Continuous Infusions:  dextrose 5 % and 0.45% NaCl 100 mL/hr at 02/27/22 2200      Diet Order             Diet regular Room service appropriate? Yes; Fluid consistency: Thin  Diet effective now                            Intake/Output Summary (Last 24 hours) at 02/28/2022 1422 Last data filed at 02/28/2022 0900 Gross per 24 hour  Intake 1721.81 ml  Output 1500 ml  Net 221.81 ml   Net IO Since Admission: 1,398.48 mL [02/28/22 1422]  Wt Readings from Last 3 Encounters:  02/28/22 86.3 kg  02/19/22 84.2 kg  02/11/22 85 kg     Unresulted Labs (From admission, onward)    None     Data Reviewed: I have personally reviewed following labs and imaging studies CBC: Recent Labs  Lab 02/22/22 0615 02/27/22 0127 02/28/22 0255  WBC 8.6 14.0* 6.8  HGB 11.8* 9.6* 10.3*  HCT 34.6* 27.5* 30.6*  MCV 76.2* 76.0* 76.5*  PLT 384 350 384   Basic Metabolic Panel: Recent Labs  Lab 02/22/22 0615 02/27/22 0127 02/28/22 0255  NA 138 138 134*  K 4.3 4.0 3.9  CL 107 107 103  CO2 25 24 24   GLUCOSE 94 77 117*  BUN 9 15 13   CREATININE 0.86 1.41* 0.85  CALCIUM 9.0 8.6* 8.9  MG 1.8  --   --    GFR: Estimated Creatinine Clearance: 124.1 mL/min (by C-G formula based on SCr of 0.85 mg/dL). Liver Function Tests: Recent Labs  Lab 02/22/22 0615 02/27/22 0127 02/28/22 0255  AST 26 54* 51*  ALT 27 35 37  ALKPHOS 56 57 54  BILITOT 0.5 0.8 0.4  PROT 6.3* 6.4* 6.1*  ALBUMIN 2.9* 3.3* 3.0*   No results for input(s): "LIPASE", "AMYLASE" in the last 168 hours. No results for input(s): "AMMONIA" in the last 168 hours. Coagulation Profile: No results for input(s): "INR", "PROTIME" in the last 168 hours. BNP (last 3 results) No results for input(s): "PROBNP" in the last 8760  hours. HbA1C: No results for input(s): "HGBA1C" in the last 72 hours. CBG: Recent Labs  Lab 02/21/22 1605 02/21/22 2154 02/22/22 0813  GLUCAP 126* 107* 106*   Lipid Profile: No results for input(s): "CHOL", "HDL", "LDLCALC", "TRIG", "CHOLHDL", "LDLDIRECT" in the last 72 hours. Thyroid Function Tests: No results for input(s): "TSH", "T4TOTAL", "FREET4", "T3FREE", "THYROIDAB" in the last 72 hours. Sepsis Labs: No results for input(s): "PROCALCITON", "LATICACIDVEN" in the last 168 hours.  Recent Results (from the past 240 hour(s))  MRSA Next Gen by PCR, Nasal     Status: None   Collection Time: 02/18/22  6:30 PM  Specimen: Nasal Mucosa; Nasal Swab  Result Value Ref Range Status   MRSA by PCR Next Gen NOT DETECTED NOT DETECTED Final    Comment: (NOTE) The GeneXpert MRSA Assay (FDA approved for NASAL specimens only), is one component of a comprehensive MRSA colonization surveillance program. It is not intended to diagnose MRSA infection nor to guide or monitor treatment for MRSA infections. Test performance is not FDA approved in patients less than 36 years old. Performed at [facility], [address] [place], [place] [address]   MRSA Next Gen by PCR, Nasal     Status: None   Collection Time: 02/27/22  9:09 PM   Specimen: Nasal Mucosa; Nasal Swab  Result Value Ref Range Status   MRSA by PCR Next Gen NOT DETECTED NOT DETECTED Final    Comment: (NOTE) The GeneXpert MRSA Assay (FDA approved for NASAL specimens only), is one component of a comprehensive MRSA colonization surveillance program. It is not intended to diagnose MRSA infection nor to guide or monitor treatment for MRSA infections. Test performance is not FDA approved in patients less than 36 years old. Performed at [facility], [address] [place], [place] [address]     Antimicrobials: Anti-infectives (From admission, onward)    None      Culture/Microbiology    Component  Value Date/Time   SDES BLOOD RIGHT HAND 10/27/2021 2030   SDES BLOOD LEFT FOREARM 10/27/2021 2030   SPECREQUEST  10/27/2021 2030    BOTTLES DRAWN AEROBIC AND ANAEROBIC Blood Culture adequate volume   SPECREQUEST  10/27/2021 2030    BOTTLES DRAWN AEROBIC AND ANAEROBIC Blood Culture results may not be optimal due to an excessive volume of blood received in culture bottles   CULT  10/27/2021 2030    NO GROWTH 5 DAYS Performed at [facility], [address] [place], [place] [address]    CULT  10/27/2021 2030    NO GROWTH 5 DAYS Performed at [facility], [address] [place], [place] [address]    REPTSTATUS 11/01/2021 FINAL 10/27/2021 2030   REPTSTATUS 11/01/2021 FINAL 10/27/2021 2030    Other culture-see note  Radiology Studies: US RENAL  Result Date: 02/27/2022 CLINICAL DATA:  Acute kidney injury EXAM: RENAL / URINARY TRACT ULTRASOUND COMPLETE COMPARISON:  CT scan 02/18/2022. FINDINGS: Right Kidney: Renal measurements: 10.6 x 5.7 x 6.2 cm = volume: 205 mL. Echogenicity is mildly increased. No mass or hydronephrosis visualized. Left Kidney: Renal measurements: 11.4 x 7.1 x 6.6 cm = volume: 276 mL. Echogenicity is mildly increased. No mass or hydronephrosis visualized. Bladder: Appears normal for degree of bladder distention. Other: None. IMPRESSION: No hydronephrosis. Mildly increased renal echogenicity compatible with medical renal disease. Electronically Signed   By: Kennith CenterEric  Mansell M.D.   On: 02/27/2022 10:53   DG CHEST PORT 1 VIEW  Result Date: 02/27/2022 CLINICAL DATA:  Altered mental status EXAM: PORTABLE CHEST 1 VIEW COMPARISON:  02/19/2022 FINDINGS: Previously seen lines and tubes are absent. The lungs are clear. No effusions. Heart size is normal. IMPRESSION: No active disease. Electronically Signed   By: Paulina FusiMark  Shogry M.D.   On: 02/27/2022 07:22     LOS: 1 day   Lanae Boastamesh Bernardo Brayman, MD [facility]  02/28/2022, 2:22 PM

## 2022-02-28 NOTE — Consult Note (Signed)
[facility] Face-to-Face Psychiatry Consult   Reason for Consult: Agitation needing intubation, recent prolonged inpatient hospital stay, bipolar disorder/PTSD Referring Physician: Dr. Tyson Babinski Patient Identification: Tristan [name] MRN:  [id number] Principal Diagnosis: Altered mental status Diagnosis:  Principal Problem:   Altered mental status Active Problems:   Bipolar 1 disorder, mixed (HCC)   Polysubstance abuse (HCC)   AKI (acute kidney injury) (HCC)   Microcytic anemia   Total Time spent with patient: 30 minutes  Subjective:    Tristan [name] is a 36 y.o. male with medical history significant of polysubstance abuse, bipolar d/o, Hepatitis C. Presenting with AMS. History is from chart review as the patient is drowsy and confused. He apparently was knocking on random doors in his neighborhood and hitting his head on the concrete. EMS was called. He apparently need versed for transport. At arrival to the ED, he remained agitated and altered. He was given ativan and geodon. [facility] was called for admission.  Patient seen and assessed at the bedside, patient is alert and well-appearing.  He presents with pleasant affect and is linear throughout conversation.  States he has no withdrawal symptoms this afternoon and has no other physical complaints.  He specifically denies diaphoresis, tremors, hallucinations, psychosis, anxiety, and or mood irritability.  He states he is unable to recall events leading and or proceeding to this hospitalization.  He further denies any recent use of methamphetamine and or cocaine, despite urine drug screen showing positive for 3 substances(he did receive Versed in the field).  Patient states while frustrated he does feel like he is ready to remain away from all substances at this time.  He also expresses interest in a residential treatment, denying ever attempting long-term residential programming.  He denies any recent trauma, losses, and/or abuse  that is related to his increase in polysubstances.  In regards to his mood, he reports feeling okay.  Has intermittent episodes of depression and mood irritability, however improved with substance use.  He currently denies any suicidal thoughts, suicidal ideation, and or recent self-harm.  He did specifically denies his polysubstance use, as a suicide attempt. Overall he feels willing to cope with his addiction, and seek help.       HPI:  Patient is a 36 years old male with past medical history polysubstance abuse, bipolar disorder, PTSD, hepatitis C was brought into the hospital with confusion and agitation.  He was noted to be delirious with encephalopathy and was sedated and intubated for airway protection and control of agitation.  Urine drug screen was positive for amphetamines benzodiazepines cocaine and marijuana.  Patient was then admitted to the ICU.  Subsequently patient was extubated and was considered stable for transfer out of ICU.  Past Psychiatric History:  Previous Psychiatric Diagnoses: Depression, Bipolar I, substance induce mood d/o, polysubstance abuse, PTSD Current Mental Health Providers: None per patient report - did not keep follow up appointments (unsure of previous mental health providers) Previous Psychological Evaluations: Yes  Past Psychiatric Hospitalizations: Unknown number per patient report; per EHR was admitted to [facility] 02/11/22, 01/16/22, 09/28/2019, 02/20/2019, 01/27/2019, and 05/02/2014 and at [facility] in 2020 Past Suicide Attempts: He admits to previous suicide attempts with last attempt a month ago by intentional OD; Per EHR he previously reported suicide attempts by laying under a bus and holding a gun thinking about shooting himself at ages 60, 49 Past History of Homicidal Behaviors / Violent or Aggressive Behaviors: Denied History of Self-Mutilation: Uncooperative to answer; per EHR previously denied Previous  Participation in PHP/IOP or Residential Programs:  Uncooperative to answer Past History of ECT / TMS / VNS: Uncooperative to answer Past Psychotropic Medication Trials: Patient unsure; per EHR previously was on Seroquel, Lexapro, Neurontin, and Prozac, Abilify, Celexa, Remeron    Past Medical History:  Past Medical History:  Diagnosis Date   Bipolar 1 disorder (HCC)    Hepatitis C    Hypertension    PTSD (post-traumatic stress disorder)     Past Surgical History:  Procedure Laterality Date   NO PAST SURGERIES     Family History:  Family History  Problem Relation Age of Onset   Other Father    Psychiatric Illness Father    Family Psychiatric  History:  Patient states he does not know about addiction, suicides, or mental health issues in his family; Per EHR he previously reported that both parents have some mental issues, an Uncle completed suicide and that there is Substance abuse in both side of the family. Social History:  Social History   Substance and Sexual Activity  Alcohol Use Yes   Comment: Pint per day     Social History   Substance and Sexual Activity  Drug Use Yes   Types: Marijuana, Heroin, Cocaine, Methamphetamines, Morphine   Comment: denies drug use    Social History   Socioeconomic History   Marital status: Single    Spouse name: Not on file   Number of children: Not on file   Years of education: Not on file   Highest education level: Not on file  Occupational History   Not on file  Tobacco Use   Smoking status: Every Day    Packs/day: 0.50    Types: Cigarettes   Smokeless tobacco: Never  Vaping Use   Vaping Use: Never used  Substance and Sexual Activity   Alcohol use: Yes    Comment: Pint per day   Drug use: Yes    Types: Marijuana, Heroin, Cocaine, Methamphetamines, Morphine    Comment: denies drug use   Sexual activity: Not Currently  Other Topics Concern   Not on file  Social History Narrative   ** Merged History Encounter **       Social Determinants of Health   Financial  Resource Strain: Not on file  Food Insecurity: Not on file  Transportation Needs: Not on file  Physical Activity: Not on file  Stress: Not on file  Social Connections: Not on file   Additional Social History:    Allergies:   Allergies  Allergen Reactions   Tomato Itching and Other (See Comments)    Throat and tongue itches    Labs:  Results for orders placed or performed during the hospital encounter of 02/27/22 (from the past 48 hour(s))  CBC     Status: Abnormal   Collection Time: 02/27/22  1:27 AM  Result Value Ref Range   WBC 14.0 (H) 4.0 - 10.5 K/uL   RBC 3.62 (L) 4.22 - 5.81 MIL/uL   Hemoglobin 9.6 (L) 13.0 - 17.0 g/dL   HCT 16.1 (L) 09.6 - 04.5 %   MCV 76.0 (L) 80.0 - 100.0 fL   MCH 26.5 26.0 - 34.0 pg   MCHC 34.9 30.0 - 36.0 g/dL   RDW 40.9 81.1 - 91.4 %   Platelets 350 150 - 400 K/uL   nRBC 0.0 0.0 - 0.2 %    Comment: Performed at [facility], [address] [place], [place] [address]  Comprehensive metabolic panel  Status: Abnormal   Collection Time: 02/27/22  1:27 AM  Result Value Ref Range   Sodium 138 135 - 145 mmol/L   Potassium 4.0 3.5 - 5.1 mmol/L   Chloride 107 98 - 111 mmol/L   CO2 24 22 - 32 mmol/L   Glucose, Bld 77 70 - 99 mg/dL    Comment: Glucose reference range applies only to samples taken after fasting for at least 8 hours.   BUN 15 6 - 20 mg/dL   Creatinine, Ser 0.981.41 (H) 0.61 - 1.24 mg/dL   Calcium 8.6 (L) 8.9 - 10.3 mg/dL   Total Protein 6.4 (L) 6.5 - 8.1 g/dL   Albumin 3.3 (L) 3.5 - 5.0 g/dL   AST 54 (H) 15 - 41 U/L   ALT 35 0 - 44 U/L   Alkaline Phosphatase 57 38 - 126 U/L   Total Bilirubin 0.8 0.3 - 1.2 mg/dL   GFR, Estimated >11>60 >91>60 mL/min    Comment: (NOTE) Calculated using the CKD-EPI Creatinine Equation (2021)    Anion gap 7 5 - 15    Comment: Performed at [facility], [address] [place], [place] [address]  Acetaminophen level     Status: Abnormal   Collection Time: 02/27/22   1:27 AM  Result Value Ref Range   Acetaminophen (Tylenol), Serum <10 (L) 10 - 30 ug/mL    Comment: (NOTE) Therapeutic concentrations vary significantly. A range of 10-30 ug/mL  may be an effective concentration for many patients. However, some  are best treated at concentrations outside of this range. Acetaminophen concentrations >150 ug/mL at 4 hours after ingestion  and >50 ug/mL at 12 hours after ingestion are often associated with  toxic reactions.  Performed at [facility], [address] [place], [place] [address]   Salicylate level     Status: Abnormal   Collection Time: 02/27/22  1:27 AM  Result Value Ref Range   Salicylate Lvl <7.0 (L) 7.0 - 30.0 mg/dL    Comment: Performed at [facility], [address] [place], [place] [address]  Rapid urine drug screen (hospital performed)     Status: Abnormal   Collection Time: 02/27/22  1:27 AM  Result Value Ref Range   Opiates NONE DETECTED NONE DETECTED   Cocaine POSITIVE (A) NONE DETECTED   Benzodiazepines POSITIVE (A) NONE DETECTED   Amphetamines POSITIVE (A) NONE DETECTED   Tetrahydrocannabinol NONE DETECTED NONE DETECTED   Barbiturates NONE DETECTED NONE DETECTED    Comment: (NOTE) DRUG SCREEN FOR MEDICAL PURPOSES ONLY.  IF CONFIRMATION IS NEEDED FOR ANY PURPOSE, NOTIFY LAB WITHIN 5 DAYS.  LOWEST DETECTABLE LIMITS FOR URINE DRUG SCREEN Drug Class                     Cutoff (ng/mL) Amphetamine and metabolites    1000 Barbiturate and metabolites    200 Benzodiazepine                 200 Tricyclics and metabolites     300 Opiates and metabolites        300 Cocaine and metabolites        300 THC                            50 Performed at [facility], [address] [place], [place] [address]   Ethanol     Status: None   Collection Time:  02/27/22  2:00 AM  Result Value Ref Range   Alcohol, Ethyl (B) <10 <10 mg/dL    Comment: (NOTE) Lowest detectable limit  for serum alcohol is 10 mg/dL.  For medical purposes only. Performed at [facility], [address] [place], [place] [address]   Urinalysis, Complete w Microscopic Urine, Clean Catch     Status: Abnormal   Collection Time: 02/27/22  6:28 AM  Result Value Ref Range   Color, Urine YELLOW YELLOW   APPearance HAZY (A) CLEAR   Specific Gravity, Urine 1.012 1.005 - 1.030   pH 5.0 5.0 - 8.0   Glucose, UA NEGATIVE NEGATIVE mg/dL   Hgb urine dipstick NEGATIVE NEGATIVE   Bilirubin Urine NEGATIVE NEGATIVE   Ketones, ur 5 (A) NEGATIVE mg/dL   Protein, ur NEGATIVE NEGATIVE mg/dL   Nitrite NEGATIVE NEGATIVE   Leukocytes,Ua NEGATIVE NEGATIVE   RBC / HPF 6-10 0 - 5 RBC/hpf   WBC, UA 6-10 0 - 5 WBC/hpf   Bacteria, UA RARE (A) NONE SEEN   Squamous Epithelial / LPF 0-5 0 - 5   Mucus PRESENT    Hyaline Casts, UA PRESENT    Granular Casts, UA PRESENT     Comment: Performed at [facility], [address], [place], [place] [address]  Blood gas, venous     Status: None   Collection Time: 02/27/22  8:00 AM  Result Value Ref Range   pH, Ven 7.32 7.25 - 7.43   pCO2, Ven 53 44 - 60 mmHg   pO2, Ven 35 32 - 45 mmHg   Bicarbonate 27.3 20.0 - 28.0 mmol/L   Acid-Base Excess 0.3 0.0 - 2.0 mmol/L   O2 Saturation 56.5 %   Patient temperature 37.0     Comment: Performed at [facility], [address], [place], [place] [address]  Iron and TIBC     Status: Abnormal   Collection Time: 02/27/22  8:52 AM  Result Value Ref Range   Iron 24 (L) 45 - 182 ug/dL   TIBC 163 845 - 364 ug/dL   Saturation Ratios 7 (L) 17.9 - 39.5 %   UIBC 327 ug/dL    Comment: Performed at [facility], [address] [place], [place] [address]  MRSA Next Gen by PCR, Nasal     Status: None   Collection Time: 02/27/22  9:09 PM   Specimen: Nasal Mucosa; Nasal Swab  Result Value Ref Range   MRSA by PCR Next Gen NOT DETECTED NOT DETECTED    Comment:  (NOTE) The GeneXpert MRSA Assay (FDA approved for NASAL specimens only), is one component of a comprehensive MRSA colonization surveillance program. It is not intended to diagnose MRSA infection nor to guide or monitor treatment for MRSA infections. Test performance is not FDA approved in patients less than 43 years old. Performed at [facility], [address] [place], [place] [address]   Comprehensive metabolic panel     Status: Abnormal   Collection Time: 02/28/22  2:55 AM  Result Value Ref Range   Sodium 134 (L) 135 - 145 mmol/L   Potassium 3.9 3.5 - 5.1 mmol/L   Chloride 103 98 - 111 mmol/L   CO2 24 22 - 32 mmol/L   Glucose, Bld 117 (H) 70 - 99 mg/dL    Comment: Glucose reference range applies only to samples taken after fasting for at least 8 hours.   BUN 13 6 - 20 mg/dL  Creatinine, Ser 0.85 0.61 - 1.24 mg/dL   Calcium 8.9 8.9 - 46.2 mg/dL   Total Protein 6.1 (L) 6.5 - 8.1 g/dL   Albumin 3.0 (L) 3.5 - 5.0 g/dL   AST 51 (H) 15 - 41 U/L   ALT 37 0 - 44 U/L   Alkaline Phosphatase 54 38 - 126 U/L   Total Bilirubin 0.4 0.3 - 1.2 mg/dL   GFR, Estimated >70 >35 mL/min    Comment: (NOTE) Calculated using the CKD-EPI Creatinine Equation (2021)    Anion gap 7 5 - 15    Comment: Performed at [facility], [address] [place], [place] [address]  CBC     Status: Abnormal   Collection Time: 02/28/22  2:55 AM  Result Value Ref Range   WBC 6.8 4.0 - 10.5 K/uL   RBC 4.00 (L) 4.22 - 5.81 MIL/uL   Hemoglobin 10.3 (L) 13.0 - 17.0 g/dL   HCT 18.2 (L) 99.3 - 71.6 %   MCV 76.5 (L) 80.0 - 100.0 fL   MCH 25.8 (L) 26.0 - 34.0 pg   MCHC 33.7 30.0 - 36.0 g/dL   RDW 96.7 89.3 - 81.0 %   Platelets 384 150 - 400 K/uL   nRBC 0.0 0.0 - 0.2 %    Comment: Performed at [facility], [address], [place], [place] [address]    Current Facility-Administered Medications  Medication Dose Route Frequency Provider Last Rate Last Admin    acetaminophen (TYLENOL) tablet 650 mg  650 mg Oral Q6H PRN Ronaldo Miyamoto, Tyrone A, DO   650 mg at 02/28/22 0222   Or   acetaminophen (TYLENOL) suppository 650 mg  650 mg Rectal Q6H PRN Margie Ege A, DO       Chlorhexidine Gluconate Cloth 2 % PADS 6 each  6 each Topical Q0600 Howerter, Justin B, DO   6 each at 02/27/22 2100   dextrose 5 %-0.45 % sodium chloride infusion   Intravenous Continuous Ronaldo Miyamoto, Tyrone A, DO 100 mL/hr at 02/27/22 2200 New Bag at 02/27/22 2200   escitalopram (LEXAPRO) tablet 10 mg  10 mg Oral Daily Kc, Dayna Barker, MD   10 mg at 02/28/22 1115   folic acid (FOLVITE) tablet 1 mg  1 mg Oral Daily Kc, Ramesh, MD   1 mg at 02/28/22 1114   gabapentin (NEURONTIN) capsule 100 mg  100 mg Oral TID Lanae Boast, MD   100 mg at 02/28/22 1114   hydrOXYzine (ATARAX) tablet 25 mg  25 mg Oral Q6H PRN Kc, Dayna Barker, MD       loperamide (IMODIUM) capsule 2 mg  2 mg Oral PRN Kc, Ramesh, MD       loratadine (CLARITIN) tablet 10 mg  10 mg Oral Daily PRN Kc, Dayna Barker, MD       methocarbamol (ROBAXIN) tablet 500 mg  500 mg Oral Q8H PRN Kc, Ramesh, MD       multivitamin with minerals tablet 1 tablet  1 tablet Oral Daily Kc, Ramesh, MD   1 tablet at 02/28/22 1118   naproxen (NAPROSYN) tablet 500 mg  500 mg Oral BID PRN Kc, Dayna Barker, MD       nicotine polacrilex (NICORETTE) gum 2 mg  2 mg Oral PRN Kc, Ramesh, MD       pantoprazole (PROTONIX) EC tablet 40 mg  40 mg Oral Daily Kc, Ramesh, MD   40 mg at 02/28/22 1114   QUEtiapine (SEROQUEL) tablet 100 mg  100 mg Oral QHS Kc,  Dayna Barker, MD       thiamine tablet 100 mg  100 mg Oral Daily Kc, Ramesh, MD   100 mg at 02/28/22 1115   ziprasidone (GEODON) injection 10 mg  10 mg Intramuscular Once PRN Teddy Spike, DO        Musculoskeletal: Strength & Muscle Tone: within normal limits Gait & Station: normal Patient leans: N/A            Psychiatric Specialty Exam:  Presentation  General Appearance: Appropriate for Environment; Casual  Eye  Contact:Good  Speech:Clear and Coherent; Normal Rate  Speech Volume:Normal  Handedness:Right   Mood and Affect  Mood:Euthymic  Affect:Appropriate; Congruent   Thought Process  Thought Processes:Coherent; Linear  Descriptions of Associations:Intact  Orientation:Full (Time, Place and Person)  Thought Content:Logical  History of Schizophrenia/Schizoaffective disorder:No  Duration of Psychotic Symptoms:Less than six months  Hallucinations:Hallucinations: None  Ideas of Reference:None  Suicidal Thoughts:Suicidal Thoughts: No  Homicidal Thoughts:Homicidal Thoughts: No   Sensorium  Memory:Immediate Poor; Recent Fair; Remote Fair  Judgment:Fair  Insight:Fair   Executive Functions  Concentration:Fair  Attention Span:Fair  Recall:Fair  Fund of Knowledge:Fair  Language:Fair   Psychomotor Activity  Psychomotor Activity:Psychomotor Activity: Normal   Assets  Assets:Communication Skills; Desire for Improvement; Financial Resources/Insurance; Leisure Time; Physical Health   Sleep  Sleep:Sleep: Fair   Physical Exam: Physical Exam ROS Blood pressure (!) 141/84, pulse 61, temperature 98.8 F (37.1 C), temperature source Oral, resp. rate 13, height  (1.778 m), weight 86.3 kg, SpO2 100 %. Body mass index is 27.3 kg/m. Tristan [name] is a 36 year old male, who is very well known to behavioral health service line, patient has recently experienced to intensive care admissions, secondary to acute metabolic encephalopathy as a result of substance-induced psychosis.  Patient denies any acute psychiatric symptoms, that would further warrant inpatient psychiatric hospitalization.  He does not appear to be of imminent danger to himself and or others.  Patient is able to verbalize negative effects from polysubstance, and agrees with consequences.  He is open to [facility] consult for residential resources.   Treatment Plan Summary: Plan Psych cleared at this  time.  Current plan is to restart home medications, he was last stabilized on during prior admission including gabapentin 100 mg p.o. 3 times daily, hydroxyzine 25 mg p.o. 4 times daily for as needed for anxiety, quetiapine 100 mg p.o. nightly.  Due to patient's minimal stress tolerance skills and emotional regulation, increased psychosis as it relates to his substance use; he would benefit from outpatient therapy upon completion of residential services.   Please anticipate possible withdrawal symptoms, agitation, worsening anxiety, and mood lability.  Substance abuse is patient's primary issue, which would be best managed in alternative settings and do not necessitate an inpatient psych admission.  -Abstain from substance abuse at this time.   -Would recommend to consult SW for rehab options patient is NOW amenable.  -Referral for outpatient substance abuse counseling and medication  Management, [facility]. -Patient also had a referral pending for [facility] in [facility], consider following up  Hepatitis C -Referral to ID clinic and integrated behavioral health therapist.  Will recommend following up with [facility] [place], as patient was recently discharged to this facility on 5/25.    Patient is determined to be psychiatrically stable at this time. Psychiatry will sign off. Please do not hesitate to call back if questions arise. Thank you for this consult.   Disposition: No evidence of imminent risk to self  or others at present.   Patient does not meet criteria for psychiatric inpatient admission. Supportive therapy provided about ongoing stressors. Discussed crisis plan, support from social network, calling 911, coming to the Emergency Department, and calling Suicide Hotline.  Maryagnes Amos, FNP 02/28/2022 3:26 PM

## 2022-03-01 NOTE — Progress Notes (Signed)
Patient has been non-complaint with treatment since the beginning of shift. Patient turns off IV pump. Patient has learned how to control pump lock. Patient refuses blood pressure cuff and EKG leads. Patient has refused prn geodon. Patient did take scheduled meds with primary nurse. Primary nurse made the [facility] provider aware of patient's behaviors. No interventions at this time. Will continue to monitor.

## 2022-03-01 NOTE — Progress Notes (Signed)
Patient received a bed and transfer orders. Patient was transferred out at 0145 with primary and secondary nurse. All of the patient belongings and chart were sent with patient. Patient was transferred via wheelchair and on room air. Patient refused telemetry monitoring. Primary nurse spoke with telemetry and notified them that patient refused telemetry.

## 2022-03-01 NOTE — Progress Notes (Signed)
Patient discharged home.  Discharge instructions explained, Patient verbalizes understanding.  

## 2022-03-01 NOTE — Progress Notes (Signed)
Patient report pain at the IV site. Upon assessment nurse noted that there was some swelling and redness noted at the site. IV was d/c. Patient refused the start of a new IV. No new orders at this time. Will continue to monitor.

## 2022-03-01 NOTE — TOC Transition Note (Signed)
Transition of Care [name]) - CM/SW Discharge Note   Patient Details  Name: Tristan [name] MRN: [id number] Date of Birth: [birth year]  [facility]) CM/SW Contact:  Lanier Clam, RN Phone Number: 03/01/2022, 10:42 AM   Clinical Narrative: Informed patient of bus pass process-he has health insurance;says he doesn't have any money to pay for bus. Provided w/bus pass. Declines any other services. Ready for d/c.      Final next level of care: Home/Self Care Barriers to Discharge: No Barriers Identified   Patient Goals and CMS Choice        Discharge Placement                       Discharge Plan and Services                                     Social Determinants of Health (SDOH) Interventions     Readmission Risk Interventions     No data to display

## 2022-03-05 ENCOUNTER — Emergency Department (HOSPITAL_COMMUNITY): Payer: PRIVATE HEALTH INSURANCE

## 2022-03-05 ENCOUNTER — Other Ambulatory Visit: Payer: Self-pay

## 2022-03-05 ENCOUNTER — Emergency Department (HOSPITAL_COMMUNITY)
Admission: EM | Admit: 2022-03-05 | Discharge: 2022-03-05 | Disposition: A | Discharge status: Home / Self Care | Payer: PRIVATE HEALTH INSURANCE | Attending: Emergency Medicine | Admitting: Emergency Medicine

## 2022-03-05 ENCOUNTER — Encounter (HOSPITAL_COMMUNITY): Payer: Self-pay | Admitting: Emergency Medicine

## 2022-03-05 DIAGNOSIS — Z20822 Contact with and (suspected) exposure to covid-19: Secondary | ICD-10-CM | POA: Insufficient documentation

## 2022-03-05 DIAGNOSIS — R000 Tachycardia, unspecified: Secondary | ICD-10-CM | POA: Insufficient documentation

## 2022-03-05 DIAGNOSIS — R4182 Altered mental status, unspecified: Secondary | ICD-10-CM | POA: Diagnosis present

## 2022-03-05 DIAGNOSIS — F1910 Other psychoactive substance abuse, uncomplicated: Secondary | ICD-10-CM

## 2022-03-05 DIAGNOSIS — R410 Disorientation, unspecified: Secondary | ICD-10-CM | POA: Diagnosis not present

## 2022-03-05 DIAGNOSIS — R5383 Other fatigue: Secondary | ICD-10-CM | POA: Insufficient documentation

## 2022-03-05 DIAGNOSIS — T50905A Adverse effect of unspecified drugs, medicaments and biological substances, initial encounter: Secondary | ICD-10-CM

## 2022-03-05 LAB — COMPREHENSIVE METABOLIC PANEL
ALT: 25 U/L (ref 0–44)
AST: 30 U/L (ref 15–41)
Albumin: 3.4 g/dL — ABNORMAL LOW (ref 3.5–5.0)
Alkaline Phosphatase: 63 U/L (ref 38–126)
Anion gap: 6 (ref 5–15)
BUN: 15 mg/dL (ref 6–20)
CO2: 26 mmol/L (ref 22–32)
Calcium: 9.1 mg/dL (ref 8.9–10.3)
Chloride: 111 mmol/L (ref 98–111)
Creatinine, Ser: 1.33 mg/dL — ABNORMAL HIGH (ref 0.61–1.24)
GFR, Estimated: >60 mL/min (ref >60)
Glucose, Bld: 106 mg/dL — ABNORMAL HIGH (ref 70–99)
Potassium: 3.8 mmol/L (ref 3.5–5.1)
Sodium: 143 mmol/L (ref 135–145)
Total Bilirubin: 0.7 mg/dL (ref 0.3–1.2)
Total Protein: 6.6 g/dL (ref 6.5–8.1)

## 2022-03-05 LAB — ACETAMINOPHEN LEVEL: Acetaminophen (Tylenol), Serum: <10 ug/mL — ABNORMAL LOW (ref 10–30)

## 2022-03-05 LAB — BLOOD GAS, VENOUS
Acid-Base Excess: 0.2 mmol/L (ref 0.0–2.0)
Bicarbonate: 26.9 mmol/L (ref 20.0–28.0)
O2 Saturation: 95.9 %
Patient temperature: 37
pCO2, Ven: 51 mmHg (ref 44–60)
pH, Ven: 7.33 (ref 7.25–7.43)
pO2, Ven: 75 mmHg — ABNORMAL HIGH (ref 32–45)

## 2022-03-05 LAB — CBC WITH DIFFERENTIAL/PLATELET
Abs Immature Granulocytes: 0.04 10*3/uL (ref 0.00–0.07)
Basophils Absolute: 0.1 10*3/uL (ref 0.0–0.1)
Basophils Relative: 1 %
Eosinophils Absolute: 0.2 10*3/uL (ref 0.0–0.5)
Eosinophils Relative: 2 %
HCT: 28.8 % — ABNORMAL LOW (ref 39.0–52.0)
Hemoglobin: 10 g/dL — ABNORMAL LOW (ref 13.0–17.0)
Immature Granulocytes: 0 %
Lymphocytes Relative: 13 %
Lymphs Abs: 1.2 10*3/uL (ref 0.7–4.0)
MCH: 26.5 pg (ref 26.0–34.0)
MCHC: 34.7 g/dL (ref 30.0–36.0)
MCV: 76.4 fL — ABNORMAL LOW (ref 80.0–100.0)
Monocytes Absolute: 0.4 10*3/uL (ref 0.1–1.0)
Monocytes Relative: 4 %
Neutro Abs: 7.8 10*3/uL — ABNORMAL HIGH (ref 1.7–7.7)
Neutrophils Relative %: 80 %
Platelets: 366 10*3/uL (ref 150–400)
RBC: 3.77 MIL/uL — ABNORMAL LOW (ref 4.22–5.81)
RDW: 15.6 % — ABNORMAL HIGH (ref 11.5–15.5)
WBC: 9.7 10*3/uL (ref 4.0–10.5)
nRBC: 0 % (ref 0.0–0.2)

## 2022-03-05 LAB — RESP PANEL BY RT-PCR (FLU A&B, COVID) ARPGX2
Influenza A by PCR: NEGATIVE
Influenza B by PCR: NEGATIVE
SARS Coronavirus 2 by RT PCR: NEGATIVE

## 2022-03-05 LAB — CBG MONITORING, ED: Glucose-Capillary: 105 mg/dL — ABNORMAL HIGH (ref 70–99)

## 2022-03-05 LAB — ETHANOL: Alcohol, Ethyl (B): <10 mg/dL (ref <10)

## 2022-03-05 LAB — CK: Total CK: 422 U/L — ABNORMAL HIGH (ref 49–397)

## 2022-03-05 LAB — SALICYLATE LEVEL: Salicylate Lvl: <7 mg/dL — ABNORMAL LOW (ref 7.0–30.0)

## 2022-03-05 MED ORDER — LACTATED RINGERS IV BOLUS
1000.0000 mL | Freq: Once | INTRAVENOUS | Status: AC
  Administered 2022-03-05: 1000 mL via INTRAVENOUS

## 2022-03-05 NOTE — ED Provider Notes (Signed)
[facility]-EMERGENCY DEPT Provider Note   CSN: [id number] Arrival date & time: 03/05/22  1548  LEVEL 5 CAVEAT - ALTERED MENTAL STATUS   History  Chief Complaint  Patient presents with   Altered Mental Status    Tristan [name] is a 36 y.o. male.  HPI 36 year old male with a known history of polysubstance abuse presents with altered mental status.  History is from the police at the bedside.  However EMS is no longer present by the time I am seeing him.  Patient was reportedly seen behind a convenience store and was chewing on a telephone cable.  He was down to the wiring.  At 1 point he was also scraping his face on the ground.  Police reports there were multiple use needles and they suspect heroin and meth.  Patient became agitated when tried to get on the EMS stretcher and had to be given 5 mg of midazolam and 5 mg Haldol. He is now unresponsive.   Chart review shows he has been in and out of the hospital for similar delirium episodes related to drugs before.  He is currently in a c-collar though it is unclear why.  Home Medications Prior to Admission medications   Medication Sig Start Date End Date Taking? Authorizing Provider  escitalopram (LEXAPRO) 10 MG tablet Take 1 tablet (10 mg total) by mouth daily. 02/14/22 03/16/22  Comer Locket, MD  folic acid (FOLVITE) 1 MG tablet Take 1 tablet (1 mg total) by mouth daily. Patient not taking: Reported on 02/27/2022 02/23/22   Joycelyn Das, MD  gabapentin (NEURONTIN) 100 MG capsule Take 1 capsule (100 mg total) by mouth 3 (three) times daily. 02/14/22 03/16/22  Comer Locket, MD  hydrOXYzine (ATARAX) 25 MG tablet Take 1 tablet (25 mg total) by mouth every 6 (six) hours as needed for anxiety. Patient not taking: Reported on 02/27/2022 02/22/22   Joycelyn Das, MD  loperamide (IMODIUM) 2 MG capsule Take 1 capsule (2 mg total) by mouth as needed for diarrhea or loose stools (diarrhea). Patient not taking:  Reported on 02/27/2022 02/22/22   Joycelyn Das, MD  loratadine (CLARITIN) 10 MG tablet Take 10 mg by mouth daily as needed for allergies.    [provider]  methocarbamol (ROBAXIN) 500 MG tablet Take 1 tablet (500 mg total) by mouth every 8 (eight) hours as needed for muscle spasms. Patient not taking: Reported on 02/27/2022 02/22/22   Joycelyn Das, MD  Multiple Vitamin (MULTIVITAMIN WITH MINERALS) TABS tablet Take 1 tablet by mouth daily. Patient not taking: Reported on 02/27/2022 02/23/22   Joycelyn Das, MD  naproxen (NAPROSYN) 500 MG tablet Take 1 tablet (500 mg total) by mouth 2 (two) times daily as needed (aching, pain, or discomfort). Patient not taking: Reported on 02/27/2022 02/22/22   Joycelyn Das, MD  nicotine polacrilex (NICORETTE) 2 MG gum Take 1 each (2 mg total) by mouth as needed for smoking cessation. Patient not taking: Reported on 02/27/2022 02/14/22 03/16/22  Comer Locket, MD  pantoprazole (PROTONIX) 40 MG tablet Take 1 tablet (40 mg total) by mouth daily. 01/23/22   Karsten Ro, MD  QUEtiapine (SEROQUEL) 100 MG tablet Take 1 tablet (100 mg total) by mouth at bedtime. 02/14/22 03/16/22  Comer Locket, MD  thiamine 100 MG tablet Take 1 tablet (100 mg total) by mouth daily. Patient not taking: Reported on 02/27/2022 02/23/22   Joycelyn Das, MD      Allergies    Tomato  Review of Systems   Review of Systems  Unable to perform ROS: Mental status change    Physical Exam Updated Vital Signs BP 138/77   Pulse 74   Temp 98.2 F (36.8 C) (Axillary)   Resp 16   SpO2 96%  Physical Exam Vitals and nursing note reviewed.  Constitutional:      Appearance: He is well-developed.     Interventions: Cervical collar in place.  HENT:     Head: Normocephalic and atraumatic.  Eyes:     Pupils: Pupils are equal, round, and reactive to light.  Cardiovascular:     Rate and Rhythm: Regular rhythm. Tachycardia present.     Heart sounds: Normal heart sounds.  Pulmonary:      Effort: Pulmonary effort is normal.     Breath sounds: Normal breath sounds.  Abdominal:     General: There is no distension.     Palpations: Abdomen is soft.  Skin:    General: Skin is warm and dry.  Neurological:     Mental Status: He is lethargic.     GCS: GCS eye subscore is 1. GCS verbal subscore is 1. GCS motor subscore is 4.     ED Results / Procedures / Treatments   Labs (all labs ordered are listed, but only abnormal results are displayed) Labs Reviewed  CK - Abnormal; Notable for the following components:      Result Value   Total CK 422 (*)    All other components within normal limits  COMPREHENSIVE METABOLIC PANEL - Abnormal; Notable for the following components:   Glucose, Bld 106 (*)    Creatinine, Ser 1.33 (*)    Albumin 3.4 (*)    All other components within normal limits  SALICYLATE LEVEL - Abnormal; Notable for the following components:   Salicylate Lvl <7.0 (*)    All other components within normal limits  ACETAMINOPHEN LEVEL - Abnormal; Notable for the following components:   Acetaminophen (Tylenol), Serum <10 (*)    All other components within normal limits  CBC WITH DIFFERENTIAL/PLATELET - Abnormal; Notable for the following components:   RBC 3.77 (*)    Hemoglobin 10.0 (*)    HCT 28.8 (*)    MCV 76.4 (*)    RDW 15.6 (*)    Neutro Abs 7.8 (*)    All other components within normal limits  BLOOD GAS, VENOUS - Abnormal; Notable for the following components:   pO2, Ven 75 (*)    All other components within normal limits  CBG MONITORING, ED - Abnormal; Notable for the following components:   Glucose-Capillary 105 (*)    All other components within normal limits  RESP PANEL BY RT-PCR (FLU A&B, COVID) ARPGX2  ETHANOL  URINALYSIS, ROUTINE W REFLEX MICROSCOPIC  RAPID URINE DRUG SCREEN, HOSP PERFORMED    EKG EKG Interpretation  Date/Time:  Tuesday March 05 2022 15:58:26 EDT Ventricular Rate:  103 PR Interval:  148 QRS Duration: 89 QT  Interval:  382 QTC Calculation: 501 R Axis:   79 Text Interpretation: Sinus tachycardia Multiple premature complexes, vent & supraven Probable left atrial enlargement Prolonged QT interval similar to February 27 2022 Confirmed by Pricilla Loveless [phone]) on 03/05/2022 4:05:28 PM  Radiology CT Cervical Spine Wo Contrast  Result Date: 03/05/2022 CLINICAL DATA:  Trauma EXAM: CT CERVICAL SPINE WITHOUT CONTRAST TECHNIQUE: Multidetector CT imaging of the cervical spine was performed without intravenous contrast. Multiplanar CT image reconstructions were also generated. RADIATION DOSE REDUCTION: This exam was performed according  to the departmental dose-optimization program which includes automated exposure control, adjustment of the mA and/or kV according to patient size and/or use of iterative reconstruction technique. COMPARISON:  02/18/2022 FINDINGS: Alignment: Alignment of posterior margins of vertebral bodies is within normal limits. Skull base and vertebrae: No fracture is seen. Soft tissues and spinal canal: There is no spinal stenosis. Disc levels:  There is no encroachment of neural foramina. Upper chest: Few blebs and bullae are seen in the apices. Other: There is slightly inhomogeneous attenuation in the thyroid. IMPRESSION: No recent fracture is seen in the cervical spine. Electronically Signed   By: Ernie AvenaPalani  Rathinasamy M.D.   On: 03/05/2022 17:55   CT Head Wo Contrast  Result Date: 03/05/2022 CLINICAL DATA:  Altered mental status EXAM: CT HEAD WITHOUT CONTRAST TECHNIQUE: Contiguous axial images were obtained from the base of the skull through the vertex without intravenous contrast. RADIATION DOSE REDUCTION: This exam was performed according to the departmental dose-optimization program which includes automated exposure control, adjustment of the mA and/or kV according to patient size and/or use of iterative reconstruction technique. COMPARISON:  02/18/2022 FINDINGS: Brain: No acute intracranial findings  are seen. There are no signs of bleeding within the cranium. Ventricles are not dilated. There is no focal edema or mass effect. Vascular: Unremarkable. Skull: Unremarkable. Sinuses/Orbits: There is mild mucosal thickening in the ethmoid sinus. Other: No significant interval changes are noted. IMPRESSION: No acute intracranial findings are seen in noncontrast CT brain. Electronically Signed   By: Ernie AvenaPalani  Rathinasamy M.D.   On: 03/05/2022 17:52   DG Chest Portable 1 View  Result Date: 03/05/2022 CLINICAL DATA:  Altered mental status. EXAM: PORTABLE CHEST 1 VIEW COMPARISON:  February 27, 2022. FINDINGS: The heart size and mediastinal contours are within normal limits. Both lungs are clear. The visualized skeletal structures are unremarkable. IMPRESSION: No active disease. Electronically Signed   By: Lupita RaiderJames  Green Jr M.D.   On: 03/05/2022 16:43    Procedures Procedures    Medications Ordered in ED Medications  lactated ringers bolus 1,000 mL (0 mLs Intravenous Stopped 03/05/22 2030)  lactated ringers bolus 1,000 mL (0 mLs Intravenous Stopped 03/05/22 2100)    ED Course/ Medical Decision Making/ A&P Clinical Course as of 03/05/22 2306  Tue Mar 05, 2022  1617 Patient is asleep and only seems to move to deep painful stimulation.  However he is currently breathing on his own around 18 times a minute.  Thus I think we will watch him closely but I do not think he needs intubation unless he were to get worse.  We will check a VBG. [SG]  1702 Patient started waking up and finding when trying to be transferred to the CT table.  They were unable to get CT.  Given this as well as he seems to be a little more arousable with painful stimuli, I think we will wait and see how he responds.  If he does not make significant improvement soon we will have to consider intubation to get CT head though my suspicion is this is all polysubstance abuse. [SG]    Clinical Course User Index [SG] Pricilla LovelessGoldston, Shantae Vantol, MD                            Medical Decision Making Amount and/or Complexity of Data Reviewed Independent Historian:     Details: Police External Data Reviewed: notes. Labs: ordered. Radiology: ordered and independent interpretation performed. ECG/medicine tests: ordered and independent  interpretation performed.   Patient was worked up with a negative head CT, C-spine CT, and chest x-ray.  Labs show a mild bump in his creatinine but he was given 2 L IV fluids.  CK is minimal at 400.  White blood cell count is normal.  Hemoglobin is stably anemic.  Otherwise, patient had a slow course of recovery but did regain his mental status and wake up and was eating and now is awake, alert, and oriented to person, place, and time.  He denies illicit drug use but significant chart review shows he is here often for polysubstance abuse.  ECG shows no acute ischemia on my interpretation.  At this point, I do not think he needs further observation given he has regained his mental status and is not suicidal or homicidal upon asking.  Appears stable for discharge.        Final Clinical Impression(s) / ED Diagnoses Final diagnoses:  Substance abuse (HCC)  Delirium, drug-induced    Rx / DC Orders ED Discharge Orders     None         Pricilla Loveless, MD 03/05/22 2308

## 2022-03-05 NOTE — ED Notes (Signed)
Ccollar removed per Dr. Colin Rhein order. Pt given sandwhich and drink as well

## 2022-03-05 NOTE — ED Triage Notes (Signed)
Patient presents post suspected drug use. He was found by PD sweaty and chewing on a power cord. Patient was cooperative until EMS attempted to put him onto the stretcher. 5 mg of midazolam and 5 mg of Haldol was given by EMS along with 500 ml of fluid. EMS GCS of 3. Patient was placed on supportive O2.     EMS vitals: 106 HR 102/45 BP 41 Cap 22 RR

## 2022-03-05 NOTE — ED Notes (Signed)
Pt found standing up with monitor wires and IV pulling, pt asking for a warm blanket. Pt is unsteady on his feet, and educated on using the call right for safety. Pt continues to get up several times without using call light.

## 2022-03-17 ENCOUNTER — Emergency Department (HOSPITAL_COMMUNITY)
Admission: EM | Admit: 2022-03-17 | Discharge: 2022-03-17 | Disposition: A | Discharge status: Home / Self Care | Payer: PRIVATE HEALTH INSURANCE | Attending: Emergency Medicine | Admitting: Emergency Medicine

## 2022-03-17 DIAGNOSIS — R4182 Altered mental status, unspecified: Secondary | ICD-10-CM | POA: Diagnosis present

## 2022-03-17 DIAGNOSIS — R799 Abnormal finding of blood chemistry, unspecified: Secondary | ICD-10-CM | POA: Insufficient documentation

## 2022-03-17 DIAGNOSIS — R451 Restlessness and agitation: Secondary | ICD-10-CM | POA: Insufficient documentation

## 2022-03-17 DIAGNOSIS — F1910 Other psychoactive substance abuse, uncomplicated: Secondary | ICD-10-CM

## 2022-03-17 DIAGNOSIS — R4689 Other symptoms and signs involving appearance and behavior: Secondary | ICD-10-CM

## 2022-03-17 LAB — COMPREHENSIVE METABOLIC PANEL
ALT: 20 U/L (ref 0–44)
Albumin: 3.6 g/dL (ref 3.5–5.0)
Alkaline Phosphatase: 65 U/L (ref 38–126)
Anion gap: 10 (ref 5–15)
BUN: 20 mg/dL (ref 6–20)
CO2: 24 mmol/L (ref 22–32)
Chloride: 106 mmol/L (ref 98–111)
Creatinine, Ser: 1.18 mg/dL (ref 0.61–1.24)
GFR, Estimated: 41 mL/min — ABNORMAL LOW (ref >60)
Glucose, Bld: 103 mg/dL — ABNORMAL HIGH (ref 70–99)
Potassium: 3.6 mmol/L (ref 3.5–5.1)
Sodium: 140 mmol/L (ref 135–145)
Total Bilirubin: 0.6 mg/dL (ref 0.3–1.2)
Total Protein: 7 g/dL (ref 6.5–8.1)

## 2022-03-17 LAB — CBC WITH DIFFERENTIAL/PLATELET
Abs Immature Granulocytes: 0.02 10*3/uL (ref 0.00–0.07)
Basophils Relative: 1 %
Eosinophils Absolute: 0.4 10*3/uL (ref 0.0–0.5)
HCT: 30.8 % — ABNORMAL LOW (ref 39.0–52.0)
Hemoglobin: 10.5 g/dL — ABNORMAL LOW (ref 13.0–17.0)
Lymphocytes Relative: 12 %
Lymphs Abs: 0.9 10*3/uL (ref 0.7–4.0)
MCH: 26.2 pg (ref 26.0–34.0)
MCHC: 34.1 g/dL (ref 30.0–36.0)
MCV: 76.8 fL — ABNORMAL LOW (ref 80.0–100.0)
Monocytes Absolute: 0.4 10*3/uL (ref 0.1–1.0)
Monocytes Relative: 6 %
Neutro Abs: 5.5 10*3/uL (ref 1.7–7.7)
Platelets: 383 10*3/uL (ref 150–400)
RBC: 4.01 MIL/uL — ABNORMAL LOW (ref 4.22–5.81)
RDW: 15 % (ref 11.5–15.5)
WBC: 7.3 10*3/uL (ref 4.0–10.5)
nRBC: 0 % (ref 0.0–0.2)

## 2022-03-17 LAB — ACETAMINOPHEN LEVEL: Acetaminophen (Tylenol), Serum: <10 ug/mL — ABNORMAL LOW (ref 10–30)

## 2022-03-17 LAB — SALICYLATE LEVEL: Salicylate Lvl: <7 mg/dL — ABNORMAL LOW (ref 7.0–30.0)

## 2022-03-17 LAB — ETHANOL: Alcohol, Ethyl (B): <10 mg/dL (ref <10)

## 2022-03-17 NOTE — ED Provider Notes (Addendum)
   7:05 AM Patient signed out to me by previous ED physician. Tristan [name] found naked and rolling around in the grass. Combative. Got versed by EMS.   Currently sleeping.  Plan: Re-access when clinically sober.  Physical Exam  BP 121/81   Pulse 86   Temp 97.6 F (36.4 C) (Oral)   Resp 13   SpO2 96%   Physical Exam Vitals and nursing note reviewed.  Constitutional:      General: He is not in acute distress.    Appearance: He is well-developed.  HENT:     Head: Normocephalic and atraumatic.  Eyes:     Conjunctiva/sclera: Conjunctivae normal.  Cardiovascular:     Rate and Rhythm: Normal rate and regular rhythm.     Heart sounds: No murmur heard. Pulmonary:     Effort: Pulmonary effort is normal. No respiratory distress.     Breath sounds: Normal breath sounds.  Abdominal:     Palpations: Abdomen is soft.     Tenderness: There is no abdominal tenderness.  Musculoskeletal:        General: No swelling.     Cervical back: Neck supple.  Skin:    General: Skin is warm and dry.     Capillary Refill: Capillary refill takes less than 2 seconds.  Neurological:     Mental Status: He is alert.  Psychiatric:        Mood and Affect: Mood normal.     Procedures  Procedures  ED Course / MDM    Medical Decision Making Amount and/or Complexity of Data Reviewed Labs: ordered.    7:18 AM Patient sleeping in bed.  Arousable but falls asleep immediately.  Will reevaluate.  Stable vitals.  9:37 AM Patient is more awake at this time however is still very lethargic.  Will continue to monitor and reevaluate.  Stable vitals.   10:36 AM When I attempted to wake patient up he continues to around in the bed with his eyes closed and does not respond verbally however when my nurse came in and verbalized that she was going to put a Foley catheter in to get a urine sample patient woke up, sat up from the bed, and stated "please do not give me a catheter, and offered her to give a urine  sample.  At this time I think patient is malingering and is ready for discharge.  Patient ready for DC,  ambulating without difficulty.  Alert and awake.  Alcohol level negative.  Salicylate and acetaminophen negative.  Patient has multiple needles in his back with Narcan.  Suspicion for drug abuse.  His mom at the bedside at this time helping patient leave the emergency department.  Multiple security at bedside due to patient's reluctance to leave.   Franne Forts, DO 03/17/22 1884       Franne Forts, DO 03/17/22 1133

## 2022-03-17 NOTE — ED Notes (Signed)
Pt given sandwich and (1) orange juice.

## 2022-03-17 NOTE — ED Notes (Signed)
Writer went into pt room to cath for urine specimen. Pt not verbally responding to commands. After pulling sheets back, pt woke up pleading for me not to cath him. Pt stated he can void by himself. Writer then gave him a urinal. He asked for water and sandwich.

## 2022-03-17 NOTE — ED Notes (Signed)
Approached pt again with discharge instructions, he again pleaded for more time because he wants answers on what happen last night.

## 2022-03-17 NOTE — ED Notes (Signed)
Security at bedside

## 2022-03-18 ENCOUNTER — Other Ambulatory Visit: Payer: Self-pay

## 2022-03-18 ENCOUNTER — Encounter (HOSPITAL_COMMUNITY): Payer: Self-pay | Admitting: Emergency Medicine

## 2022-03-18 ENCOUNTER — Encounter (HOSPITAL_COMMUNITY): Payer: Self-pay

## 2022-03-18 ENCOUNTER — Emergency Department (HOSPITAL_COMMUNITY)
Admission: EM | Admit: 2022-03-18 | Discharge: 2022-03-18 | Disposition: A | Discharge status: Home / Self Care | Payer: PRIVATE HEALTH INSURANCE | Attending: Emergency Medicine | Admitting: Emergency Medicine

## 2022-03-18 DIAGNOSIS — F1910 Other psychoactive substance abuse, uncomplicated: Secondary | ICD-10-CM | POA: Diagnosis not present

## 2022-03-18 DIAGNOSIS — R1013 Epigastric pain: Secondary | ICD-10-CM | POA: Insufficient documentation

## 2022-03-18 DIAGNOSIS — I10 Essential (primary) hypertension: Secondary | ICD-10-CM | POA: Diagnosis not present

## 2022-03-18 LAB — COMPREHENSIVE METABOLIC PANEL
ALT: 22 U/L (ref 0–44)
AST: 37 U/L (ref 15–41)
Albumin: 3.9 g/dL (ref 3.5–5.0)
Alkaline Phosphatase: 66 U/L (ref 38–126)
Anion gap: 4 — ABNORMAL LOW (ref 5–15)
BUN: 17 mg/dL (ref 6–20)
CO2: 27 mmol/L (ref 22–32)
Calcium: 8.9 mg/dL (ref 8.9–10.3)
Chloride: 107 mmol/L (ref 98–111)
Creatinine, Ser: 1 mg/dL (ref 0.61–1.24)
GFR, Estimated: >60 mL/min (ref >60)
Glucose, Bld: 111 mg/dL — ABNORMAL HIGH (ref 70–99)
Potassium: 4.4 mmol/L (ref 3.5–5.1)
Sodium: 138 mmol/L (ref 135–145)
Total Bilirubin: 0.3 mg/dL (ref 0.3–1.2)
Total Protein: 7.7 g/dL (ref 6.5–8.1)

## 2022-03-18 LAB — CBC WITH DIFFERENTIAL/PLATELET
Abs Immature Granulocytes: 0.02 10*3/uL (ref 0.00–0.07)
Basophils Absolute: 0.1 10*3/uL (ref 0.0–0.1)
Basophils Relative: 1 %
Eosinophils Absolute: 0.8 10*3/uL — ABNORMAL HIGH (ref 0.0–0.5)
Eosinophils Relative: 8 %
HCT: 34.2 % — ABNORMAL LOW (ref 39.0–52.0)
Hemoglobin: 11.5 g/dL — ABNORMAL LOW (ref 13.0–17.0)
Immature Granulocytes: 0 %
Lymphocytes Relative: 20 %
Lymphs Abs: 2 10*3/uL (ref 0.7–4.0)
MCH: 26 pg (ref 26.0–34.0)
MCHC: 33.6 g/dL (ref 30.0–36.0)
MCV: 77.4 fL — ABNORMAL LOW (ref 80.0–100.0)
Monocytes Absolute: 0.7 10*3/uL (ref 0.1–1.0)
Monocytes Relative: 7 %
Neutro Abs: 6.5 10*3/uL (ref 1.7–7.7)
Neutrophils Relative %: 64 %
Platelets: 372 10*3/uL (ref 150–400)
RBC: 4.42 MIL/uL (ref 4.22–5.81)
RDW: 15.3 % (ref 11.5–15.5)
WBC: 10.1 10*3/uL (ref 4.0–10.5)
nRBC: 0 % (ref 0.0–0.2)

## 2022-03-18 LAB — LIPASE, BLOOD: Lipase: 20 U/L (ref 11–51)

## 2022-03-18 MED ORDER — BUPRENORPHINE HCL-NALOXONE HCL 8-2 MG SL SUBL
1.0000 | SUBLINGUAL_TABLET | Freq: Every day | SUBLINGUAL | Status: DC
  Administered 2022-03-18: 1 via SUBLINGUAL
  Filled 2022-03-18: qty 1

## 2022-03-18 MED ORDER — BUPRENORPHINE HCL-NALOXONE HCL 8-2 MG SL FILM: 1.0000 | ORAL_FILM | Freq: Every day | SUBLINGUAL | 0 refills | Status: AC

## 2022-03-18 MED ORDER — SODIUM CHLORIDE 0.9 % IV BOLUS
500.0000 mL | Freq: Once | INTRAVENOUS | Status: AC
  Administered 2022-03-18: 500 mL via INTRAVENOUS

## 2022-03-18 NOTE — ED Triage Notes (Signed)
BIBA with law enforcement for suspected drug abuse and epigastric pain

## 2022-03-26 ENCOUNTER — Encounter (HOSPITAL_COMMUNITY): Payer: Self-pay | Admitting: Emergency Medicine

## 2022-03-26 ENCOUNTER — Other Ambulatory Visit: Payer: Self-pay

## 2022-03-26 ENCOUNTER — Emergency Department (HOSPITAL_COMMUNITY)
Admission: EM | Admit: 2022-03-26 | Discharge: 2022-03-26 | Disposition: A | Discharge status: Home / Self Care | Payer: PRIVATE HEALTH INSURANCE | Attending: Emergency Medicine | Admitting: Emergency Medicine

## 2022-03-26 ENCOUNTER — Encounter (HOSPITAL_COMMUNITY): Payer: Self-pay

## 2022-03-26 DIAGNOSIS — F1910 Other psychoactive substance abuse, uncomplicated: Secondary | ICD-10-CM

## 2022-03-26 DIAGNOSIS — M79671 Pain in right foot: Secondary | ICD-10-CM | POA: Diagnosis present

## 2022-03-26 DIAGNOSIS — R4689 Other symptoms and signs involving appearance and behavior: Secondary | ICD-10-CM

## 2022-03-26 DIAGNOSIS — R4182 Altered mental status, unspecified: Secondary | ICD-10-CM | POA: Diagnosis present

## 2022-03-26 LAB — CBC WITH DIFFERENTIAL/PLATELET
Abs Immature Granulocytes: 0.02 10*3/uL (ref 0.00–0.07)
Basophils Absolute: 0.1 10*3/uL (ref 0.0–0.1)
Basophils Relative: 1 %
Eosinophils Absolute: 0.7 10*3/uL — ABNORMAL HIGH (ref 0.0–0.5)
Eosinophils Relative: 9 %
HCT: 33.6 % — ABNORMAL LOW (ref 39.0–52.0)
Hemoglobin: 11.2 g/dL — ABNORMAL LOW (ref 13.0–17.0)
Immature Granulocytes: 0 %
Lymphocytes Relative: 25 %
Lymphs Abs: 1.9 10*3/uL (ref 0.7–4.0)
MCH: 25.9 pg — ABNORMAL LOW (ref 26.0–34.0)
MCHC: 33.3 g/dL (ref 30.0–36.0)
MCV: 77.6 fL — ABNORMAL LOW (ref 80.0–100.0)
Monocytes Absolute: 0.7 10*3/uL (ref 0.1–1.0)
Monocytes Relative: 9 %
Neutro Abs: 4.2 10*3/uL (ref 1.7–7.7)
Neutrophils Relative %: 56 %
Platelets: 337 10*3/uL (ref 150–400)
RBC: 4.33 MIL/uL (ref 4.22–5.81)
RDW: 15.4 % (ref 11.5–15.5)
WBC: 7.7 10*3/uL (ref 4.0–10.5)
nRBC: 0 % (ref 0.0–0.2)

## 2022-03-26 LAB — BASIC METABOLIC PANEL
Anion gap: 4 — ABNORMAL LOW (ref 5–15)
BUN: 11 mg/dL (ref 6–20)
CO2: 28 mmol/L (ref 22–32)
Calcium: 9.1 mg/dL (ref 8.9–10.3)
Chloride: 109 mmol/L (ref 98–111)
Creatinine, Ser: 0.87 mg/dL (ref 0.61–1.24)
GFR, Estimated: >60 mL/min (ref >60)
Glucose, Bld: 83 mg/dL (ref 70–99)
Potassium: 4 mmol/L (ref 3.5–5.1)
Sodium: 141 mmol/L (ref 135–145)

## 2022-03-26 MED ORDER — LORAZEPAM 2 MG/ML IJ SOLN
1.0000 mg | Freq: Once | INTRAMUSCULAR | Status: AC
  Administered 2022-03-26: 1 mg via INTRAMUSCULAR

## 2022-03-26 MED ORDER — DIPHENHYDRAMINE HCL 50 MG/ML IJ SOLN
25.0000 mg | Freq: Once | INTRAMUSCULAR | Status: DC
  Filled 2022-03-26: qty 1

## 2022-03-26 MED ORDER — PROPRANOLOL HCL 20 MG PO TABS
10.0000 mg | ORAL_TABLET | Freq: Two times a day (BID) | ORAL | Status: DC
  Administered 2022-03-26: 10 mg via ORAL
  Filled 2022-03-26: qty 1

## 2022-03-26 MED ORDER — MIDAZOLAM HCL (PF) 10 MG/2ML IJ SOLN: 5.0000 mg | Freq: Once | INTRAMUSCULAR | Status: DC

## 2022-03-26 MED ORDER — LORAZEPAM 2 MG/ML IJ SOLN
1.0000 mg | Freq: Once | INTRAMUSCULAR | Status: DC
  Filled 2022-03-26: qty 1

## 2022-03-26 MED ORDER — MIDAZOLAM HCL 2 MG/2ML IJ SOLN
INTRAMUSCULAR | Status: AC
  Filled 2022-03-26: qty 6

## 2022-03-26 MED ORDER — DIPHENHYDRAMINE HCL 50 MG/ML IJ SOLN
25.0000 mg | Freq: Once | INTRAMUSCULAR | Status: AC
Start: 2022-03-26 — End: 2022-03-26
  Administered 2022-03-26: 25 mg via INTRAMUSCULAR

## 2022-03-26 NOTE — ED Triage Notes (Signed)
Pt BIB EMS. Found outside of [facility] on [address] after rolling around for hours. Picked up by law enforcement after pounding the ground and tree.

## 2022-03-26 NOTE — ED Provider Notes (Signed)
[facility]-EMERGENCY DEPT Provider Note   CSN: [id number] Arrival date & time: 03/26/22  2240     History  Chief Complaint  Patient presents with   Foot Pain    Tristan [name] is a 36 y.o. male who presents to the emergency department complaining of right foot pain.  Patient was seen in the emergency department 1 hour prior to arrival where he had a thorough work-up in the emergency department.  Brought in by EMS due to laying down in front of the ambulance in front of the ambulance bay.  The history is provided by the patient. No language interpreter was used.       Home Medications Prior to Admission medications   Medication Sig Start Date End Date Taking? Authorizing Provider  escitalopram (LEXAPRO) 10 MG tablet Take 1 tablet (10 mg total) by mouth daily. 02/14/22 03/16/22  Comer Locket, MD  folic acid (FOLVITE) 1 MG tablet Take 1 tablet (1 mg total) by mouth daily. Patient not taking: Reported on 02/27/2022 02/23/22   Joycelyn Das, MD  gabapentin (NEURONTIN) 100 MG capsule Take 1 capsule (100 mg total) by mouth 3 (three) times daily. 02/14/22 03/16/22  Comer Locket, MD  hydrOXYzine (ATARAX) 25 MG tablet Take 1 tablet (25 mg total) by mouth every 6 (six) hours as needed for anxiety. Patient not taking: Reported on 02/27/2022 02/22/22   Joycelyn Das, MD  loperamide (IMODIUM) 2 MG capsule Take 1 capsule (2 mg total) by mouth as needed for diarrhea or loose stools (diarrhea). Patient not taking: Reported on 02/27/2022 02/22/22   Joycelyn Das, MD  loratadine (CLARITIN) 10 MG tablet Take 10 mg by mouth daily as needed for allergies.    [provider]  methocarbamol (ROBAXIN) 500 MG tablet Take 1 tablet (500 mg total) by mouth every 8 (eight) hours as needed for muscle spasms. Patient not taking: Reported on 02/27/2022 02/22/22   Joycelyn Das, MD  Multiple Vitamin (MULTIVITAMIN WITH MINERALS) TABS tablet Take 1 tablet by mouth daily. Patient  not taking: Reported on 02/27/2022 02/23/22   Joycelyn Das, MD  naproxen (NAPROSYN) 500 MG tablet Take 1 tablet (500 mg total) by mouth 2 (two) times daily as needed (aching, pain, or discomfort). Patient not taking: Reported on 02/27/2022 02/22/22   Joycelyn Das, MD  pantoprazole (PROTONIX) 40 MG tablet Take 1 tablet (40 mg total) by mouth daily. 01/23/22   Karsten Ro, MD  QUEtiapine (SEROQUEL) 100 MG tablet Take 1 tablet (100 mg total) by mouth at bedtime. 02/14/22 03/16/22  Comer Locket, MD  thiamine 100 MG tablet Take 1 tablet (100 mg total) by mouth daily. Patient not taking: Reported on 02/27/2022 02/23/22   Joycelyn Das, MD      Allergies    Tomato    Review of Systems   Review of Systems  All other systems reviewed and are negative.   Physical Exam Updated Vital Signs BP 118/83 (BP Location: Right Arm)   Pulse 71   Temp 98.5 F (36.9 C) (Oral)   Resp 16   SpO2 100%  Physical Exam Vitals and nursing note reviewed.  Constitutional:      General: He is not in acute distress.    Appearance: Normal appearance.  Eyes:     General: No scleral icterus.    Extraocular Movements: Extraocular movements intact.  Cardiovascular:     Rate and Rhythm: Normal rate.  Pulmonary:     Effort: Pulmonary effort is normal. No respiratory  distress.  Abdominal:     Palpations: Abdomen is soft. There is no mass.     Tenderness: There is no abdominal tenderness.  Musculoskeletal:        General: Normal range of motion.     Cervical back: Neck supple.     Comments: No tenderness to palpation noted to right foot.  No overlying skin changes.  Pedal pulses intact.  Skin:    General: Skin is warm and dry.     Findings: No rash.  Neurological:     Mental Status: He is alert.     Sensory: Sensation is intact.     Motor: Motor function is intact.  Psychiatric:        Behavior: Behavior normal.    ED Results / Procedures / Treatments   Labs (all labs ordered are listed, but only abnormal  results are displayed) Labs Reviewed - No data to display  EKG None  Radiology No results found.  Procedures Procedures    Medications Ordered in ED Medications - No data to display  ED Course/ Medical Decision Making/ A&P                           Medical Decision Making  Pt presents with right foot pain onset PTA. Vital signs, stable, pt afebrile. On exam, pt with no tenderness to palpation noted to right foot.  No overlying skin changes.  Pedal pulses intact. No acute cardiovascular or respiratory exam findings. Differential diagnosis includes fracture, dislocation, contusion.    Additional history obtained:  External records from outside source obtained and reviewed including: Patient was evaluated in the emergency department and discharged less than 1 hour prior to arrival to the emergency department for this current visit.  Patient has had multiple visits to the emergency department within the past several months.  Patient had a negative work-up today in the emergency department prior to discharge. At that time patient was visualized ambulating in the emergency department without assistance or difficulty.   Disposition: Presentation suspicious for right foot pain.  Doubt fracture, dislocation, contusion at this time. After consideration of the diagnostic results and the patients response to treatment, I feel that the patient would benefit from Discharge home. Supportive care measures and strict return precautions discussed with patient at bedside. Pt acknowledges and verbalizes understanding. Pt appears safe for discharge. Follow up as indicated in discharge paperwork.    This chart was dictated using voice recognition software, Dragon. Despite the best efforts of this provider to proofread and correct errors, errors may still occur which can change documentation meaning.  Final Clinical Impression(s) / ED Diagnoses Final diagnoses:  Foot pain, right    Rx / DC Orders ED  Discharge Orders     None         Eleanor Dimichele A, PA-C 03/26/22 2320    Maia Plan, MD 04/01/22 [phone]

## 2022-03-26 NOTE — ED Provider Notes (Signed)
[facility]-EMERGENCY DEPT Provider Note   CSN: [id number] Arrival date & time: 03/26/22  1442     History  Chief Complaint  Patient presents with   Altered Mental Status    Tristan [name] is a 36 y.o. male.  Patient brought in by EMS and police.  Reportedly had bizarre behavior outside and bystanders had called police.  Patient midst to using drugs.  He is otherwise has no complaints of headache or chest pain or fever vomiting cough or diarrhea.  He denies any homicidal suicidal ideation.  He otherwise prefers to go home he states.       Home Medications Prior to Admission medications   Medication Sig Start Date End Date Taking? Authorizing Provider  escitalopram (LEXAPRO) 10 MG tablet Take 1 tablet (10 mg total) by mouth daily. 02/14/22 03/16/22  Comer Locket, MD  folic acid (FOLVITE) 1 MG tablet Take 1 tablet (1 mg total) by mouth daily. Patient not taking: Reported on 02/27/2022 02/23/22   Joycelyn Das, MD  gabapentin (NEURONTIN) 100 MG capsule Take 1 capsule (100 mg total) by mouth 3 (three) times daily. 02/14/22 03/16/22  Comer Locket, MD  hydrOXYzine (ATARAX) 25 MG tablet Take 1 tablet (25 mg total) by mouth every 6 (six) hours as needed for anxiety. Patient not taking: Reported on 02/27/2022 02/22/22   Joycelyn Das, MD  loperamide (IMODIUM) 2 MG capsule Take 1 capsule (2 mg total) by mouth as needed for diarrhea or loose stools (diarrhea). Patient not taking: Reported on 02/27/2022 02/22/22   Joycelyn Das, MD  loratadine (CLARITIN) 10 MG tablet Take 10 mg by mouth daily as needed for allergies.    [provider]  methocarbamol (ROBAXIN) 500 MG tablet Take 1 tablet (500 mg total) by mouth every 8 (eight) hours as needed for muscle spasms. Patient not taking: Reported on 02/27/2022 02/22/22   Joycelyn Das, MD  Multiple Vitamin (MULTIVITAMIN WITH MINERALS) TABS tablet Take 1 tablet by mouth daily. Patient not taking: Reported on  02/27/2022 02/23/22   Joycelyn Das, MD  naproxen (NAPROSYN) 500 MG tablet Take 1 tablet (500 mg total) by mouth 2 (two) times daily as needed (aching, pain, or discomfort). Patient not taking: Reported on 02/27/2022 02/22/22   Joycelyn Das, MD  pantoprazole (PROTONIX) 40 MG tablet Take 1 tablet (40 mg total) by mouth daily. 01/23/22   Karsten Ro, MD  QUEtiapine (SEROQUEL) 100 MG tablet Take 1 tablet (100 mg total) by mouth at bedtime. 02/14/22 03/16/22  Comer Locket, MD  thiamine 100 MG tablet Take 1 tablet (100 mg total) by mouth daily. Patient not taking: Reported on 02/27/2022 02/23/22   Joycelyn Das, MD      Allergies    Tomato    Review of Systems   Review of Systems  Constitutional:  Negative for fever.  HENT:  Negative for ear pain and sore throat.   Eyes:  Negative for pain.  Respiratory:  Negative for cough.   Cardiovascular:  Negative for chest pain.  Gastrointestinal:  Negative for abdominal pain.  Genitourinary:  Negative for flank pain.  Musculoskeletal:  Negative for back pain.  Skin:  Negative for color change and rash.  Neurological:  Negative for syncope.  All other systems reviewed and are negative.   Physical Exam Updated Vital Signs BP (!) 120/104 (BP Location: Right Arm)   Pulse 100   Temp 98.3 F (36.8 C) (Oral)   Resp 20   Ht 5\' 10"  (1.778 m)  Wt 86 kg   SpO2 100%   BMI 27.20 kg/m  Physical Exam Constitutional:      Appearance: He is well-developed.  HENT:     Head: Normocephalic.     Nose: Nose normal.  Eyes:     Extraocular Movements: Extraocular movements intact.  Cardiovascular:     Rate and Rhythm: Normal rate.  Pulmonary:     Effort: Pulmonary effort is normal.  Skin:    Coloration: Skin is not jaundiced.  Neurological:     Mental Status: He is alert. Mental status is at baseline.     Comments: Patient is moving all extremities awake and alert.  However he is very agitated, cannot sit still, jumpy and twitchy.     ED Results /  Procedures / Treatments   Labs (all labs ordered are listed, but only abnormal results are displayed) Labs Reviewed  CBC WITH DIFFERENTIAL/PLATELET  BASIC METABOLIC PANEL    EKG None  Radiology No results found.  Procedures Procedures    Medications Ordered in ED Medications  midazolam PF (VERSED) injection 5 mg (has no administration in time range)  midazolam (VERSED) 2 MG/2ML injection (5 mg  Not Given 03/26/22 1503)    ED Course/ Medical Decision Making/ A&P                           Medical Decision Making Amount and/or Complexity of Data Reviewed Labs: ordered.  Risk Prescription drug management.   Patient appears cooperative at this time.  Review of records shows prior similar visits in the past multiple times.  Vital signs remained stable, anticipate discharge home once the patient settles down a bit more and can walk calmly.  Plan to continue ER observation pending lab work.  Signed out to oncoming provider.        Final Clinical Impression(s) / ED Diagnoses Final diagnoses:  Substance abuse [facility])    Rx / DC Orders ED Discharge Orders     None         Cheryll Cockayne, MD 03/26/22 1515

## 2022-03-26 NOTE — ED Notes (Signed)
Pt d/c home per MD order. MD to bedside .Pt not cooperative, security and [employer] to bedside.

## 2022-03-26 NOTE — ED Notes (Signed)
Patient continues to wander throughout the department despite education from the nurse.

## 2022-03-26 NOTE — ED Notes (Signed)
Patient continues to reposition himself upside down on the bed despite education from nurse.

## 2022-03-26 NOTE — ED Notes (Signed)
Security at bedside for Versed administration d/t increased agitation.

## 2022-03-26 NOTE — ED Notes (Signed)
Pt found wandering into room 2, redirected back to room.

## 2022-03-26 NOTE — ED Notes (Signed)
Sandwich and cup of juice given to pt to evaluate status.

## 2022-03-26 NOTE — ED Provider Notes (Signed)
Signout note  36 year old male presented to ER for bizarre behavior.  Plan to check basic labs, monitor patient.  Versed had been ordered.  When I assessed patient he was having some writhing in bed, moving all 4 of his extremities and random movements.  Concern for possible akathisia from his antipsychotics.  Also consider polysubstance abuse.  Discussed with pharmacy, give dose of Benadryl and propanolol.  No significant change in symptoms.  Gave dose of Ativan, did have some improvement.  Resting more calmly.  Patient allowed to sleep for couple hours.  On reassessment, the abnormal movements have completely resolved.  Given the complete resolution of the symptoms I have stronger suspicion the abnormal movements were related to substance abuse or behavior and less likely true akathisia.  He denies ongoing medical complaints, his vital signs are within normal limits.  I discussed the case with his mother who is emergency contact and notified her of his presence in ER, she was not aware that he was in ER and did not know of any other ongoing concerns today.  Patient is alert, he is able to state name, year, location.  Denies any SI or HI.  Given his current appearance and no ongoing medical complaint and normal vitals, feel patient is appropriate for discharge and does not require further observation in ER or admission at this time.  Patient was witnessed walking in the hallways without any difficulty or gait abnormality or assistance prior to discharge.  Repeat vital signs stable.  Reviewed return precautions and patient was discharged.   Milagros Loll, MD 03/26/22 2125

## 2022-03-26 NOTE — ED Triage Notes (Addendum)
Patient was just discharged an hour ago. BIB [facility] after being found in the middle of the road asleep. Said he had right heel pain.  CBG 91

## 2022-03-26 NOTE — Discharge Instructions (Signed)
Recommend stopping abusing substances.  Follow-up with your primary doctor.  Come back to ER as needed.

## 2022-03-27 ENCOUNTER — Encounter (HOSPITAL_COMMUNITY): Payer: Self-pay

## 2022-03-27 ENCOUNTER — Other Ambulatory Visit: Payer: Self-pay

## 2022-03-27 ENCOUNTER — Emergency Department (HOSPITAL_COMMUNITY)
Admission: EM | Admit: 2022-03-27 | Discharge: 2022-03-27 | Disposition: A | Discharge status: Home / Self Care | Payer: PRIVATE HEALTH INSURANCE | Attending: Emergency Medicine | Admitting: Emergency Medicine

## 2022-03-27 DIAGNOSIS — R440 Auditory hallucinations: Secondary | ICD-10-CM | POA: Diagnosis present

## 2022-03-27 DIAGNOSIS — Z59819 Housing instability, housed unspecified: Secondary | ICD-10-CM | POA: Insufficient documentation

## 2022-03-27 NOTE — ED Provider Notes (Signed)
  [facility]-EMERGENCY DEPT [facility] Emergency Department Provider Note MRN:  [id number]  Arrival date & time: 03/27/22     Chief Complaint   Hearing Voices  History of Present Illness   Tristan [name] is a 36 y.o. year-old male presents to the ED with chief complaint of hearing voices.  Was seen here earlier tonight for foot pain. He states that he doesn't have anywhere to go and is having a hard time sleeping because he doesn't have anywhere to stay.  History provided by patient.   Review of Systems  Pertinent review of systems noted in HPI.    Physical Exam   Vitals:   03/27/22 0152  BP: (!) 129/91  Pulse: 67  Resp: 18  Temp: 98.5 F (36.9 C)  SpO2: 100%    CONSTITUTIONAL:  nontoxic-appearing, NAD NEURO:  Alert and oriented x 3, CN 3-12 grossly intact EYES:  eyes equal and reactive ENT/NECK:  Supple, no stridor  CARDIO:   appears well-perfused  PULM:  No respiratory distress,  GI/GU:  non-distended,  MSK/SPINE:  No gross deformities, no edema, moves all extremities  SKIN:  no rash, atraumatic   *Additional and/or pertinent findings included in MDM below  Diagnostic and Interventional Summary    EKG Interpretation  Date/Time:    Ventricular Rate:    PR Interval:    QRS Duration:   QT Interval:    QTC Calculation:   R Axis:     Text Interpretation:         Labs Reviewed - No data to display  No orders to display    Medications - No data to display   Procedures  /  Critical Care Procedures  ED Course and Medical Decision Making  I have reviewed the triage vital signs, the nursing notes, and pertinent available records from the EMR.  Social Determinants Affecting Complexity of Care: Patient is homelessness.   ED Course:    Medical Decision Making   Patient returns again tonight saying he can't sleep and that he has awakened 3 times tonight.  He doesn't have anywhere to stay.  Resources for shelters provided.  Patient given food  and blanket.  Temperatures outside are pleasant tonight.  I don't believe him to be a danger to himself or others at this time. Consultants: No consultations were needed in caring for this patient.   Treatment and Plan: Emergency department workup does not suggest an emergent condition requiring admission or immediate intervention beyond  what has been performed at this time. The patient is safe for discharge and has  been instructed to return immediately for worsening symptoms, change in  symptoms or any other concerns    Final Clinical Impressions(s) / ED Diagnoses     ICD-10-CM   1. Housing instability  Z59.819       ED Discharge Orders     None         Discharge Instructions Discussed with and Provided to Patient:   Discharge Instructions   None      Roxy Horseman, PA-C 03/27/22 0205    Sloan Leiter, DO 03/27/22 [phone]

## 2022-03-27 NOTE — ED Triage Notes (Signed)
Patients third ED visit today and he stated after he was discharged he does not remember anything. He said he is tired, hungry and lost his socks. Patient alert and oriented x4, not complaining of pain.

## 2022-03-28 ENCOUNTER — Encounter (HOSPITAL_COMMUNITY): Payer: Self-pay

## 2022-03-28 ENCOUNTER — Emergency Department (HOSPITAL_COMMUNITY)
Admission: EM | Admit: 2022-03-28 | Discharge: 2022-03-28 | Disposition: A | Discharge status: Home / Self Care | Payer: PRIVATE HEALTH INSURANCE | Attending: Emergency Medicine | Admitting: Emergency Medicine

## 2022-03-28 DIAGNOSIS — R4182 Altered mental status, unspecified: Secondary | ICD-10-CM | POA: Insufficient documentation

## 2022-03-28 DIAGNOSIS — F17210 Nicotine dependence, cigarettes, uncomplicated: Secondary | ICD-10-CM | POA: Insufficient documentation

## 2022-03-28 DIAGNOSIS — I10 Essential (primary) hypertension: Secondary | ICD-10-CM | POA: Diagnosis not present

## 2022-03-28 DIAGNOSIS — Z5900 Homelessness unspecified: Secondary | ICD-10-CM | POA: Diagnosis not present

## 2022-03-28 NOTE — Discharge Instructions (Addendum)
It was a pleasure caring for you today in the emergency department. ° °Please return to the emergency department for any worsening or worrisome symptoms. ° ° °

## 2022-03-28 NOTE — ED Triage Notes (Signed)
Pt BIB EMS, found naked on side walk pleasuring himself. Pt not answering questions upon EMS arrival, currently A&O x 4.

## 2022-03-28 NOTE — ED Provider Notes (Signed)
[facility]-EMERGENCY DEPT Provider Note   CSN: [id number] Arrival date & time: 03/28/22  0018     History  Chief Complaint  Patient presents with   Altered Mental Status    Tristan [name] is a 36 y.o. male.  Patient as above with significant medical history as below, including bipolar 1 disorder, hep C, hypertension, PTSD who presents to the ED with complaint of homeless.  Patient well-known to this emergency department, multiple ED visits with similar complaints.  Patient is brought in today by EMS as he was found naked on the sidewalk masturbating.  Patient has no acute complaints this time.  He is requesting some food and a place to stay.     Past Medical History:  Diagnosis Date   Bipolar 1 disorder (HCC)    Hepatitis C    Hypertension    PTSD (post-traumatic stress disorder)     Past Surgical History:  Procedure Laterality Date   NO PAST SURGERIES        Altered Mental Status Presenting symptoms: no confusion   Associated symptoms: no abdominal pain, no agitation, no fever, no headaches, no nausea, no palpitations, no rash and no vomiting        Home Medications Prior to Admission medications   Medication Sig Start Date End Date Taking? Authorizing Provider  escitalopram (LEXAPRO) 10 MG tablet Take 1 tablet (10 mg total) by mouth daily. 02/14/22 03/16/22  Comer Locket, MD  folic acid (FOLVITE) 1 MG tablet Take 1 tablet (1 mg total) by mouth daily. Patient not taking: Reported on 02/27/2022 02/23/22   Joycelyn Das, MD  gabapentin (NEURONTIN) 100 MG capsule Take 1 capsule (100 mg total) by mouth 3 (three) times daily. 02/14/22 03/16/22  Comer Locket, MD  hydrOXYzine (ATARAX) 25 MG tablet Take 1 tablet (25 mg total) by mouth every 6 (six) hours as needed for anxiety. Patient not taking: Reported on 02/27/2022 02/22/22   Joycelyn Das, MD  loperamide (IMODIUM) 2 MG capsule Take 1 capsule (2 mg total) by mouth as needed for diarrhea  or loose stools (diarrhea). Patient not taking: Reported on 02/27/2022 02/22/22   Joycelyn Das, MD  loratadine (CLARITIN) 10 MG tablet Take 10 mg by mouth daily as needed for allergies.    [provider]  methocarbamol (ROBAXIN) 500 MG tablet Take 1 tablet (500 mg total) by mouth every 8 (eight) hours as needed for muscle spasms. Patient not taking: Reported on 02/27/2022 02/22/22   Joycelyn Das, MD  Multiple Vitamin (MULTIVITAMIN WITH MINERALS) TABS tablet Take 1 tablet by mouth daily. Patient not taking: Reported on 02/27/2022 02/23/22   Joycelyn Das, MD  naproxen (NAPROSYN) 500 MG tablet Take 1 tablet (500 mg total) by mouth 2 (two) times daily as needed (aching, pain, or discomfort). Patient not taking: Reported on 02/27/2022 02/22/22   Joycelyn Das, MD  pantoprazole (PROTONIX) 40 MG tablet Take 1 tablet (40 mg total) by mouth daily. 01/23/22   Karsten Ro, MD  QUEtiapine (SEROQUEL) 100 MG tablet Take 1 tablet (100 mg total) by mouth at bedtime. 02/14/22 03/16/22  Comer Locket, MD  thiamine 100 MG tablet Take 1 tablet (100 mg total) by mouth daily. Patient not taking: Reported on 02/27/2022 02/23/22   Joycelyn Das, MD      Allergies    Tomato    Review of Systems   Review of Systems  Constitutional:  Negative for chills and fever.  HENT:  Negative for facial swelling and  trouble swallowing.   Eyes:  Negative for photophobia and visual disturbance.  Respiratory:  Negative for cough and shortness of breath.   Cardiovascular:  Negative for chest pain and palpitations.  Gastrointestinal:  Negative for abdominal pain, nausea and vomiting.  Endocrine: Negative for polydipsia and polyuria.  Genitourinary:  Negative for difficulty urinating and hematuria.  Musculoskeletal:  Negative for gait problem and joint swelling.  Skin:  Negative for pallor and rash.  Neurological:  Negative for syncope and headaches.  Psychiatric/Behavioral:  Negative for agitation and confusion.      Physical Exam Updated Vital Signs BP 137/78 (BP Location: Left Arm)   Pulse 85   Temp 98.8 F (37.1 C) (Oral)   Resp 17   Ht 5\' 10"  (1.778 m)   Wt 85.7 kg   SpO2 96%   BMI 27.12 kg/m  Physical Exam Vitals and nursing note reviewed.  Constitutional:      General: He is not in acute distress.    Appearance: Normal appearance. He is well-developed.  HENT:     Head: Normocephalic and atraumatic. No raccoon eyes, Battle's sign, right periorbital erythema or left periorbital erythema.     Right Ear: External ear normal.     Left Ear: External ear normal.     Mouth/Throat:     Mouth: Mucous membranes are moist.  Eyes:     General: No scleral icterus. Cardiovascular:     Rate and Rhythm: Normal rate and regular rhythm.     Pulses: Normal pulses.     Heart sounds: Normal heart sounds.  Pulmonary:     Effort: Pulmonary effort is normal. No respiratory distress.     Breath sounds: Normal breath sounds.  Abdominal:     General: Abdomen is flat.     Palpations: Abdomen is soft.     Tenderness: There is no abdominal tenderness.  Musculoskeletal:        General: Normal range of motion.     Cervical back: Normal range of motion.     Right lower leg: No edema.     Left lower leg: No edema.  Skin:    General: Skin is warm and dry.     Capillary Refill: Capillary refill takes less than 2 seconds.  Neurological:     Mental Status: He is alert and oriented to person, place, and time.     GCS: GCS eye subscore is 4. GCS verbal subscore is 5. GCS motor subscore is 6.  Psychiatric:        Mood and Affect: Mood normal.        Behavior: Behavior normal.     ED Results / Procedures / Treatments   Labs (all labs ordered are listed, but only abnormal results are displayed) Labs Reviewed - No data to display  EKG None  Radiology No results found.  Procedures Procedures    Medications Ordered in ED Medications - No data to display  ED Course/ Medical Decision Making/  A&P                           Medical Decision Making   CC: Homeless  This patient presents to the Emergency Department for the above complaint. This involves an extensive number of treatment options and is a complaint that carries with it a high risk of complications and morbidity. Vital signs were reviewed. Serious etiologies considered.  Record review:  Previous records obtained and reviewed prior ED visits, prior labs  and imaging  Additional history obtained from EMS  Medical and surgical history as noted above.   Work up as above, notable for:  Labs & imaging results that were available during my care of the patient were visualized by me and considered in my medical decision making.  Physical exam as above.  Patient with no acute medical complaints this time.  Do not feel labs or imaging will contribute to care plan at this time.  Management: Given food, drink, blanket  ED Course:     Reassessment:  Patient has no acute complaints.  Do not believe an acute emergent condition is present this time.  He is ambulatory with a steady gait.  Not intoxicated.  No acute psychotic.  Patient is requesting to wait in the lobby until his friend can come pick him up.  Admission was considered.   Patient given resource information, local PCP follow-up.  Advised to refrain from tobacco use.  The patient improved significantly and was discharged in stable condition. Detailed discussions were had with the patient regarding current findings, and need for close f/u with PCP or on call doctor. The patient has been instructed to return immediately if the symptoms worsen in any way for re-evaluation. Patient verbalized understanding and is in agreement with current care plan. All questions answered prior to discharge.             Social determinants of health include -  -Homeless -No pcp -Counseled patient for approximately 3 minutes regarding smoking cessation. Discussed risks of  smoking and how they applied and affected their visit here today. Patient not ready to quit at this time, however will follow up with their primary doctor when they are.   CPT code: 63875: intermediate counseling for smoking cessation   Social History   Socioeconomic History   Marital status: Single    Spouse name: Not on file   Number of children: Not on file   Years of education: Not on file   Highest education level: Not on file  Occupational History   Not on file  Tobacco Use   Smoking status: Every Day    Packs/day: 0.50    Types: Cigarettes   Smokeless tobacco: Never  Vaping Use   Vaping Use: Never used  Substance and Sexual Activity   Alcohol use: Yes    Comment: Pint per day   Drug use: Yes    Types: Marijuana, Heroin, Cocaine, Methamphetamines, Morphine    Comment: denies drug use   Sexual activity: Not Currently  Other Topics Concern   Not on file  Social History Narrative   ** Merged History Encounter **       ** Merged History Encounter **       Social Determinants of Health   Financial Resource Strain: Not on file  Food Insecurity: Not on file  Transportation Needs: Not on file  Physical Activity: Not on file  Stress: Not on file  Social Connections: Not on file  Intimate Partner Violence: Not on file      This chart was dictated using Chemical engineer.  Despite best efforts to proofread,  errors can occur which can change the documentation meaning.         Final Clinical Impression(s) / ED Diagnoses Final diagnoses:  Homeless    Rx / DC Orders ED Discharge Orders     None         Sloan Leiter, DO 03/28/22 0130

## 2022-03-31 ENCOUNTER — Emergency Department (HOSPITAL_COMMUNITY): Payer: PRIVATE HEALTH INSURANCE

## 2022-03-31 ENCOUNTER — Emergency Department (HOSPITAL_COMMUNITY)
Admission: EM | Admit: 2022-03-31 | Discharge: 2022-03-31 | Disposition: A | Discharge status: Home / Self Care | Payer: PRIVATE HEALTH INSURANCE | Attending: Emergency Medicine | Admitting: Emergency Medicine

## 2022-03-31 ENCOUNTER — Encounter (HOSPITAL_COMMUNITY): Payer: Self-pay | Admitting: Emergency Medicine

## 2022-03-31 DIAGNOSIS — F1910 Other psychoactive substance abuse, uncomplicated: Secondary | ICD-10-CM | POA: Insufficient documentation

## 2022-03-31 DIAGNOSIS — F1510 Other stimulant abuse, uncomplicated: Secondary | ICD-10-CM | POA: Insufficient documentation

## 2022-03-31 DIAGNOSIS — R4182 Altered mental status, unspecified: Secondary | ICD-10-CM | POA: Diagnosis present

## 2022-03-31 DIAGNOSIS — F1410 Cocaine abuse, uncomplicated: Secondary | ICD-10-CM | POA: Diagnosis not present

## 2022-03-31 LAB — CBC WITH DIFFERENTIAL/PLATELET
Abs Immature Granulocytes: 0.01 10*3/uL (ref 0.00–0.07)
Basophils Absolute: 0.1 10*3/uL (ref 0.0–0.1)
Basophils Relative: 1 %
Eosinophils Absolute: 0.3 10*3/uL (ref 0.0–0.5)
Eosinophils Relative: 4 %
HCT: 33.1 % — ABNORMAL LOW (ref 39.0–52.0)
Hemoglobin: 11 g/dL — ABNORMAL LOW (ref 13.0–17.0)
Immature Granulocytes: 0 %
Lymphocytes Relative: 38 %
Lymphs Abs: 2.7 10*3/uL (ref 0.7–4.0)
MCH: 25.8 pg — ABNORMAL LOW (ref 26.0–34.0)
MCHC: 33.2 g/dL (ref 30.0–36.0)
MCV: 77.7 fL — ABNORMAL LOW (ref 80.0–100.0)
Monocytes Absolute: 0.6 10*3/uL (ref 0.1–1.0)
Monocytes Relative: 8 %
Neutro Abs: 3.6 10*3/uL (ref 1.7–7.7)
Neutrophils Relative %: 49 %
Platelets: 293 10*3/uL (ref 150–400)
RBC: 4.26 MIL/uL (ref 4.22–5.81)
RDW: 15.6 % — ABNORMAL HIGH (ref 11.5–15.5)
WBC: 7.1 10*3/uL (ref 4.0–10.5)
nRBC: 0 % (ref 0.0–0.2)

## 2022-03-31 LAB — RAPID URINE DRUG SCREEN, HOSP PERFORMED
Amphetamines: POSITIVE — AB
Barbiturates: NOT DETECTED
Benzodiazepines: POSITIVE — AB
Cocaine: POSITIVE — AB
Opiates: NOT DETECTED
Tetrahydrocannabinol: POSITIVE — AB

## 2022-03-31 LAB — COMPREHENSIVE METABOLIC PANEL
ALT: 67 U/L — ABNORMAL HIGH (ref 0–44)
AST: 39 U/L (ref 15–41)
Albumin: 4 g/dL (ref 3.5–5.0)
Alkaline Phosphatase: 69 U/L (ref 38–126)
Anion gap: 7 (ref 5–15)
BUN: 14 mg/dL (ref 6–20)
CO2: 28 mmol/L (ref 22–32)
Calcium: 9.2 mg/dL (ref 8.9–10.3)
Chloride: 106 mmol/L (ref 98–111)
Creatinine, Ser: 1.02 mg/dL (ref 0.61–1.24)
GFR, Estimated: >60 mL/min (ref >60)
Glucose, Bld: 89 mg/dL (ref 70–99)
Potassium: 4.3 mmol/L (ref 3.5–5.1)
Sodium: 141 mmol/L (ref 135–145)
Total Bilirubin: 0.6 mg/dL (ref 0.3–1.2)
Total Protein: 7.8 g/dL (ref 6.5–8.1)

## 2022-03-31 LAB — ETHANOL: Alcohol, Ethyl (B): <10 mg/dL (ref <10)

## 2022-03-31 MED ORDER — SODIUM CHLORIDE 0.9 % IV BOLUS
1000.0000 mL | Freq: Once | INTRAVENOUS | Status: AC
  Administered 2022-03-31: 1000 mL via INTRAVENOUS

## 2022-03-31 MED ORDER — ZIPRASIDONE MESYLATE 20 MG IM SOLR
20.0000 mg | Freq: Once | INTRAMUSCULAR | Status: AC
  Administered 2022-03-31: 20 mg via INTRAMUSCULAR
  Filled 2022-03-31: qty 20

## 2022-03-31 MED ORDER — STERILE WATER FOR INJECTION IJ SOLN
INTRAMUSCULAR | Status: AC
  Filled 2022-03-31: qty 10

## 2022-03-31 NOTE — ED Provider Notes (Signed)
[facility] DEPT Provider Note   CSN: [id number] Arrival date & time: 03/31/22  1134     History  Chief Complaint  Patient presents with   Altered Mental Status    Tristan [name] is a 36 y.o. male.  Patient arrives with EMS and police after being found wandering in a parking lot acting erratic.  Patient admitted to drug use including heroin, amphetamine.  Patient was noted to be on the ground trying to swim.  Not sure if there was any trauma or falls.  Patient was given Valium in route and needed to be in restraints.  He denies any pain.  Overall level 5 caveat due to suspect intoxication.  EMS and police did not notice any traumatic findings.  He was found with multiple syringes.  The history is provided by the patient.       Home Medications Prior to Admission medications   Medication Sig Start Date End Date Taking? Authorizing Provider  escitalopram (LEXAPRO) 10 MG tablet Take 1 tablet (10 mg total) by mouth daily. 02/14/22 03/16/22  Harlow Asa, MD  folic acid (FOLVITE) 1 MG tablet Take 1 tablet (1 mg total) by mouth daily. Patient not taking: Reported on 02/27/2022 02/23/22   Flora Lipps, MD  gabapentin (NEURONTIN) 100 MG capsule Take 1 capsule (100 mg total) by mouth 3 (three) times daily. 02/14/22 03/16/22  Harlow Asa, MD  hydrOXYzine (ATARAX) 25 MG tablet Take 1 tablet (25 mg total) by mouth every 6 (six) hours as needed for anxiety. Patient not taking: Reported on 02/27/2022 02/22/22   Flora Lipps, MD  loperamide (IMODIUM) 2 MG capsule Take 1 capsule (2 mg total) by mouth as needed for diarrhea or loose stools (diarrhea). Patient not taking: Reported on 02/27/2022 02/22/22   Flora Lipps, MD  loratadine (CLARITIN) 10 MG tablet Take 10 mg by mouth daily as needed for allergies.    [provider]  methocarbamol (ROBAXIN) 500 MG tablet Take 1 tablet (500 mg total) by mouth every 8 (eight) hours as needed for muscle  spasms. Patient not taking: Reported on 02/27/2022 02/22/22   Flora Lipps, MD  Multiple Vitamin (MULTIVITAMIN WITH MINERALS) TABS tablet Take 1 tablet by mouth daily. Patient not taking: Reported on 02/27/2022 02/23/22   Flora Lipps, MD  naproxen (NAPROSYN) 500 MG tablet Take 1 tablet (500 mg total) by mouth 2 (two) times daily as needed (aching, pain, or discomfort). Patient not taking: Reported on 02/27/2022 02/22/22   Flora Lipps, MD  pantoprazole (PROTONIX) 40 MG tablet Take 1 tablet (40 mg total) by mouth daily. 01/23/22   Armando Reichert, MD  QUEtiapine (SEROQUEL) 100 MG tablet Take 1 tablet (100 mg total) by mouth at bedtime. 02/14/22 03/16/22  Harlow Asa, MD  thiamine 100 MG tablet Take 1 tablet (100 mg total) by mouth daily. Patient not taking: Reported on 02/27/2022 02/23/22   Flora Lipps, MD      Allergies    Tomato    Review of Systems   Review of Systems  Physical Exam Updated Vital Signs BP 137/86   Pulse (!) 51   Temp (!) 97.4 F (36.3 C) (Oral)   Resp 17   SpO2 100%  Physical Exam Vitals and nursing note reviewed.  Constitutional:      General: He is not in acute distress.    Appearance: He is well-developed. He is not ill-appearing.  HENT:     Head: Normocephalic and atraumatic.  Nose: Nose normal.  Eyes:     Conjunctiva/sclera: Conjunctivae normal.  Cardiovascular:     Rate and Rhythm: Normal rate and regular rhythm.     Heart sounds: No murmur heard. Pulmonary:     Effort: Pulmonary effort is normal. No respiratory distress.     Breath sounds: Normal breath sounds.  Abdominal:     Palpations: Abdomen is soft.     Tenderness: There is no abdominal tenderness.  Musculoskeletal:        General: No swelling.     Cervical back: Neck supple.  Skin:    General: Skin is warm and dry.     Capillary Refill: Capillary refill takes less than 2 seconds.  Neurological:     General: No focal deficit present.     Mental Status: He is alert.     Comments:  Patient moves all extremities, will answer some questions,  Psychiatric:     Comments: Patient is a little erratic and labile     ED Results / Procedures / Treatments   Labs (all labs ordered are listed, but only abnormal results are displayed) Labs Reviewed  COMPREHENSIVE METABOLIC PANEL - Abnormal; Notable for the following components:      Result Value   ALT 67 (*)    All other components within normal limits  CBC WITH DIFFERENTIAL/PLATELET - Abnormal; Notable for the following components:   Hemoglobin 11.0 (*)    HCT 33.1 (*)    MCV 77.7 (*)    MCH 25.8 (*)    RDW 15.6 (*)    All other components within normal limits  ETHANOL  RAPID URINE DRUG SCREEN, HOSP PERFORMED    EKG EKG Interpretation  Date/Time:  Sunday March 31 2022 11:56:01 EDT Ventricular Rate:  84 PR Interval:  151 QRS Duration: 98 QT Interval:  388 QTC Calculation: 459 R Axis:   88 Text Interpretation: Sinus rhythm Confirmed by Virgina Norfolk ([id number]) on 03/31/2022 12:28:55 PM  Radiology CT Head Wo Contrast  Result Date: 03/31/2022 CLINICAL DATA:  Pt BIB EMS, found wondering around parking lot with jerky movement. Unable to recall name, birthday or surroundings. Noted on the ground "trying to swim". " EXAM: CT HEAD WITHOUT CONTRAST TECHNIQUE: Contiguous axial images were obtained from the base of the skull through the vertex without intravenous contrast. RADIATION DOSE REDUCTION: This exam was performed according to the departmental dose-optimization program which includes automated exposure control, adjustment of the mA and/or kV according to patient size and/or use of iterative reconstruction technique. COMPARISON:  03/05/2022. FINDINGS: Brain: No evidence of acute infarction, hemorrhage, hydrocephalus, extra-axial collection or mass lesion/mass effect. Vascular: No hyperdense vessel or unexpected calcification. Skull: Normal. Negative for fracture or focal lesion. Sinuses/Orbits: Visualized globes and orbits are  unremarkable. Visualized sinuses are clear. Other: None. IMPRESSION: Normal unenhanced CT scan of the brain. Electronically Signed   By: Amie Portland M.D.   On: 03/31/2022 13:00   DG Chest Portable 1 View  Result Date: 03/31/2022 CLINICAL DATA:  Altered mental status. EXAM: PORTABLE CHEST 1 VIEW COMPARISON:  Chest x-ray dated March 05, 2022. FINDINGS: The heart size and mediastinal contours are within normal limits. Both lungs are clear. The visualized skeletal structures are unremarkable. IMPRESSION: No active disease. Electronically Signed   By: Obie Dredge M.D.   On: 03/31/2022 12:23    Procedures Procedures    Medications Ordered in ED Medications  sodium chloride 0.9 % bolus 1,000 mL (0 mLs Intravenous Stopped 03/31/22 1319)  ziprasidone (GEODON)  injection 20 mg (20 mg Intramuscular Given 03/31/22 1207)  sterile water (preservative free) injection (  Given 03/31/22 1207)    ED Course/ Medical Decision Making/ A&P                           Medical Decision Making Amount and/or Complexity of Data Reviewed Labs: ordered. Radiology: ordered.  Risk Prescription drug management.   Jerelyn Charles Laton is here after suspected polysubstance abuse/erratic behavior.  Patient with normal vitals.  No fever.  Upon chart review multiple ED visits in the past for the same.  History of PTSD, bipolar, polysubstance abuse.  He admits to cocaine, heroin, methamphetamine use.  Patient was given Valium in route with EMS due to combativeness.  No obvious signs of trauma on exam.  He is moving all extremities.  His vital signs are normal.  Will likely need some further stabilization of his agitation and will give Geodon and restraints if needed.  We will check CBC, CMP, head CT and give IV fluids and allow patient to metabolize substances.  Per review and interpretation of labs there are no significant findings.  Head CT and chest x-ray per my review and interpretation show no acute findings.  Overall  suspect polysubstance abuse causing agitation today.  Patient continues to metabolize.  He is resting comfortably and mildly sedated following Geodon.  Handed off to oncoming ED staff with patient pending reevaluation.  This chart was dictated using voice recognition software.  Despite best efforts to proofread,  errors can occur which can change the documentation meaning.         Final Clinical Impression(s) / ED Diagnoses Final diagnoses:  Polysubstance abuse [facility])    Rx / DC Orders ED Discharge Orders     None         Virgina Norfolk, DO 03/31/22 1440

## 2022-03-31 NOTE — ED Notes (Signed)
Patient transported to CT 

## 2022-03-31 NOTE — ED Notes (Signed)
Cup of water provided to pt at this time.

## 2022-03-31 NOTE — ED Notes (Signed)
Pt ambulated approximately 30 feet in hallway with nurse.

## 2022-03-31 NOTE — ED Triage Notes (Signed)
Pt BIB EMS, found wondering around parking lot with jerky movement. Unable to recall name, birthday or surroundings. Noted on the ground "trying to swim".

## 2022-03-31 NOTE — ED Notes (Signed)
Meal tray provided at this time, pt tolerated well

## 2022-03-31 NOTE — ED Provider Notes (Signed)
  Physical Exam  BP 120/80 (BP Location: Right Arm)   Pulse (!) 59   Temp (!) 97.4 F (36.3 C) (Oral)   Resp 16   SpO2 100%   Physical Exam Constitutional:      General: He is not in acute distress.    Appearance: Normal appearance.  HENT:     Head: Normocephalic and atraumatic.     Nose: No congestion or rhinorrhea.  Eyes:     General:        Right eye: No discharge.        Left eye: No discharge.     Extraocular Movements: Extraocular movements intact.     Pupils: Pupils are equal, round, and reactive to light.  Cardiovascular:     Rate and Rhythm: Normal rate and regular rhythm.     Heart sounds: No murmur heard. Pulmonary:     Effort: No respiratory distress.     Breath sounds: No wheezing or rales.  Abdominal:     General: There is no distension.     Tenderness: There is no abdominal tenderness.  Musculoskeletal:        General: Normal range of motion.     Cervical back: Normal range of motion.  Skin:    General: Skin is warm and dry.  Neurological:     General: No focal deficit present.     Mental Status: He is alert.     Procedures  Procedures  ED Course / MDM   Clinical Course as of 03/31/22 1706  Sun Mar 31, 2022  1507 MTF [MK]    Clinical Course User Index [MK] Glendora Score, MD   Medical Decision Making Amount and/or Complexity of Data Reviewed Labs: ordered. Radiology: ordered.  Risk Prescription drug management.   Patient received in handoff.  Polysubstance abuse that previously required chemical restraint.  Plan is for metabolization to freedom.  On reevaluation, patient able to ambulate without difficulty, maintaining stable vital signs.  Patient has successfully metabolized his substances and is safe for discharge with outpatient follow-up.       Glendora Score, MD 03/31/22 [phone]

## 2022-04-03 ENCOUNTER — Other Ambulatory Visit: Payer: Self-pay

## 2022-04-03 ENCOUNTER — Encounter (HOSPITAL_COMMUNITY): Payer: Self-pay

## 2022-04-03 ENCOUNTER — Emergency Department (HOSPITAL_COMMUNITY)
Admission: EM | Admit: 2022-04-03 | Discharge: 2022-04-03 | Disposition: A | Discharge status: Home / Self Care | Payer: PRIVATE HEALTH INSURANCE | Attending: Emergency Medicine | Admitting: Emergency Medicine

## 2022-04-03 DIAGNOSIS — T405X1A Poisoning by cocaine, accidental (unintentional), initial encounter: Secondary | ICD-10-CM | POA: Diagnosis not present

## 2022-04-03 DIAGNOSIS — R400 Somnolence: Secondary | ICD-10-CM | POA: Diagnosis not present

## 2022-04-03 DIAGNOSIS — F1910 Other psychoactive substance abuse, uncomplicated: Secondary | ICD-10-CM

## 2022-04-03 LAB — ACETAMINOPHEN LEVEL: Acetaminophen (Tylenol), Serum: <10 ug/mL — ABNORMAL LOW (ref 10–30)

## 2022-04-03 LAB — HEPATIC FUNCTION PANEL
ALT: 37 U/L (ref 0–44)
AST: 31 U/L (ref 15–41)
Albumin: 3.5 g/dL (ref 3.5–5.0)
Alkaline Phosphatase: 62 U/L (ref 38–126)
Bilirubin, Direct: <0.1 mg/dL (ref 0.0–0.2)
Total Bilirubin: 0.4 mg/dL (ref 0.3–1.2)
Total Protein: 6.8 g/dL (ref 6.5–8.1)

## 2022-04-03 LAB — CBC WITH DIFFERENTIAL/PLATELET
Abs Immature Granulocytes: 0.02 10*3/uL (ref 0.00–0.07)
Basophils Absolute: 0.1 10*3/uL (ref 0.0–0.1)
Basophils Relative: 1 %
Eosinophils Absolute: 0.8 10*3/uL — ABNORMAL HIGH (ref 0.0–0.5)
Eosinophils Relative: 12 %
HCT: 32.8 % — ABNORMAL LOW (ref 39.0–52.0)
Hemoglobin: 11.2 g/dL — ABNORMAL LOW (ref 13.0–17.0)
Immature Granulocytes: 0 %
Lymphocytes Relative: 30 %
Lymphs Abs: 1.9 10*3/uL (ref 0.7–4.0)
MCH: 26 pg (ref 26.0–34.0)
MCHC: 34.1 g/dL (ref 30.0–36.0)
MCV: 76.3 fL — ABNORMAL LOW (ref 80.0–100.0)
Monocytes Absolute: 0.5 10*3/uL (ref 0.1–1.0)
Monocytes Relative: 8 %
Neutro Abs: 3.1 10*3/uL (ref 1.7–7.7)
Neutrophils Relative %: 49 %
Platelets: 314 10*3/uL (ref 150–400)
RBC: 4.3 MIL/uL (ref 4.22–5.81)
RDW: 15.3 % (ref 11.5–15.5)
WBC: 6.5 10*3/uL (ref 4.0–10.5)
nRBC: 0 % (ref 0.0–0.2)

## 2022-04-03 LAB — BASIC METABOLIC PANEL
Anion gap: 7 (ref 5–15)
BUN: 17 mg/dL (ref 6–20)
CO2: 25 mmol/L (ref 22–32)
Calcium: 9 mg/dL (ref 8.9–10.3)
Chloride: 106 mmol/L (ref 98–111)
Creatinine, Ser: 0.77 mg/dL (ref 0.61–1.24)
GFR, Estimated: >60 mL/min (ref >60)
Glucose, Bld: 91 mg/dL (ref 70–99)
Potassium: 3.8 mmol/L (ref 3.5–5.1)
Sodium: 138 mmol/L (ref 135–145)

## 2022-04-03 LAB — ETHANOL: Alcohol, Ethyl (B): <10 mg/dL (ref <10)

## 2022-04-03 LAB — SALICYLATE LEVEL: Salicylate Lvl: <7 mg/dL — ABNORMAL LOW (ref 7.0–30.0)

## 2022-04-03 NOTE — Discharge Instructions (Signed)
Stop using drugs.  Follow-up with a therapist or psychiatrist in the attached list.  Return to the ED with new or worsening symptoms or thoughts of going to hurt yourself or anyone else

## 2022-04-03 NOTE — ED Notes (Addendum)
Pt ambulated without assistants and is able to drink/eat.

## 2022-04-03 NOTE — ED Provider Notes (Signed)
[facility] DEPT Provider Note   CSN: [id number] Arrival date & time: 04/03/22  0530     History  Chief Complaint  Patient presents with   Drug Overdose    Tristan [name] is a 36 y.o. male.  HPI     This is a 36 year old male who is brought in by EMS after being found unresponsive.  Per EMS he had a cracked right and used needles at the scene.  Somnolent but arousable and will follow command.  Did not receive any Narcan or intervention.  On my evaluation, he is somnolent but arousable to loud voice and sternal rub.  He could not provide any history.  It does appear that he was seen and evaluated on July 7 for similar presentation.  He has a known history of polysubstance abuse.  At that time he endorsed heroin and methamphetamine use.  Level 5 caveat for altered mental status  Home Medications Prior to Admission medications   Medication Sig Start Date End Date Taking? Authorizing Provider  escitalopram (LEXAPRO) 10 MG tablet Take 1 tablet (10 mg total) by mouth daily. 02/14/22 03/16/22  Harlow Asa, MD  folic acid (FOLVITE) 1 MG tablet Take 1 tablet (1 mg total) by mouth daily. Patient not taking: Reported on 02/27/2022 02/23/22   Flora Lipps, MD  gabapentin (NEURONTIN) 100 MG capsule Take 1 capsule (100 mg total) by mouth 3 (three) times daily. 02/14/22 03/16/22  Harlow Asa, MD  hydrOXYzine (ATARAX) 25 MG tablet Take 1 tablet (25 mg total) by mouth every 6 (six) hours as needed for anxiety. Patient not taking: Reported on 02/27/2022 02/22/22   Flora Lipps, MD  loperamide (IMODIUM) 2 MG capsule Take 1 capsule (2 mg total) by mouth as needed for diarrhea or loose stools (diarrhea). Patient not taking: Reported on 02/27/2022 02/22/22   Flora Lipps, MD  loratadine (CLARITIN) 10 MG tablet Take 10 mg by mouth daily as needed for allergies.    [provider]  methocarbamol (ROBAXIN) 500 MG tablet Take 1 tablet (500 mg total) by  mouth every 8 (eight) hours as needed for muscle spasms. Patient not taking: Reported on 02/27/2022 02/22/22   Flora Lipps, MD  Multiple Vitamin (MULTIVITAMIN WITH MINERALS) TABS tablet Take 1 tablet by mouth daily. Patient not taking: Reported on 02/27/2022 02/23/22   Flora Lipps, MD  naproxen (NAPROSYN) 500 MG tablet Take 1 tablet (500 mg total) by mouth 2 (two) times daily as needed (aching, pain, or discomfort). Patient not taking: Reported on 02/27/2022 02/22/22   Flora Lipps, MD  pantoprazole (PROTONIX) 40 MG tablet Take 1 tablet (40 mg total) by mouth daily. 01/23/22   Armando Reichert, MD  QUEtiapine (SEROQUEL) 100 MG tablet Take 1 tablet (100 mg total) by mouth at bedtime. 02/14/22 03/16/22  Harlow Asa, MD  thiamine 100 MG tablet Take 1 tablet (100 mg total) by mouth daily. Patient not taking: Reported on 02/27/2022 02/23/22   Flora Lipps, MD      Allergies    Tomato    Review of Systems   Review of Systems  Unable to perform ROS: Mental status change    Physical Exam Updated Vital Signs BP 125/83   Pulse (!) 58   Temp 98.3 F (36.8 C) (Axillary)   Resp 10   Ht 1.778 m (5\' 10" )   Wt 85.7 kg   SpO2 99%   BMI 27.11 kg/m  Physical Exam Vitals and nursing note reviewed.  Constitutional:  Appearance: He is well-developed.     Comments: Somnolent but arousable  HENT:     Head: Normocephalic and atraumatic.  Eyes:     Pupils: Pupils are equal, round, and reactive to light.     Comments: Fours and reactive bilaterally  Cardiovascular:     Rate and Rhythm: Normal rate and regular rhythm.     Heart sounds: Normal heart sounds.  Pulmonary:     Effort: Pulmonary effort is normal. No respiratory distress.     Breath sounds: Normal breath sounds.  Abdominal:     Palpations: Abdomen is soft.     Tenderness: There is no abdominal tenderness.  Musculoskeletal:     Cervical back: Neck supple.  Lymphadenopathy:     Cervical: No cervical adenopathy.  Skin:    General:  Skin is warm and dry.  Neurological:     Comments: Somnolent but arousable, will follow simple commands  Psychiatric:     Comments: Unable to assess     ED Results / Procedures / Treatments   Labs (all labs ordered are listed, but only abnormal results are displayed) Labs Reviewed  CBC WITH DIFFERENTIAL/PLATELET - Abnormal; Notable for the following components:      Result Value   Hemoglobin 11.2 (*)    HCT 32.8 (*)    MCV 76.3 (*)    Eosinophils Absolute 0.8 (*)    All other components within normal limits  BASIC METABOLIC PANEL  RAPID URINE DRUG SCREEN, HOSP PERFORMED  ETHANOL    EKG EKG Interpretation  Date/Time:  Wednesday April 03 2022 05:41:01 EDT Ventricular Rate:  57 PR Interval:  164 QRS Duration: 100 QT Interval:  452 QTC Calculation: 441 R Axis:   85 Text Interpretation: Sinus rhythm ST elev, probable normal early repol pattern Confirmed by Ross Marcus ([id number]) on 04/03/2022 6:35:29 AM  Radiology No results found.  Procedures Procedures    Medications Ordered in ED Medications - No data to display  ED Course/ Medical Decision Making/ A&P                           Medical Decision Making Amount and/or Complexity of Data Reviewed Labs: ordered.   This patient presents to the ED for concern of unconsciousness, this involves an extensive number of treatment options, and is a complaint that carries with it a high risk of complications and morbidity.  I considered the following differential and admission for this acute, potentially life threatening condition.  The differential diagnosis includes acute toxicity, overdose, syncope  MDM:    This is a 36 year old male who presents with altered level of consciousness.  He is nontoxic.  Vital signs are reassuring.  He is arousable but very somnolent on exam.  He will follow simple commands.  Highly suspect polysubstance abuse.  He does not appear focal and there are no external signs of trauma.  We will hold  off on imaging at this time.  EKG shows no evidence of acute arrhythmia.  Labs obtained.  Initial lab work largely reassuring.  UDS pending.  Patient will need time to metabolize.  (Labs, imaging, consults)  Labs: I Ordered, and personally interpreted labs.  The pertinent results include: CBC, BMP, ethanol level, UDS  Imaging Studies ordered: I ordered imaging studies including none I independently visualized and interpreted imaging. I agree with the radiologist interpretation  Additional history obtained from record review.  External records from outside source obtained and reviewed including prior  evaluations  Cardiac Monitoring: The patient was maintained on a cardiac monitor.  I personally viewed and interpreted the cardiac monitored which showed an underlying rhythm of: Normal sinus rhythm  Reevaluation: After the interventions noted above, I reevaluated the patient and found that they have :stayed the same  Social Determinants of Health: Polysubstance abuse  Disposition: Pending  Co morbidities that complicate the patient evaluation  Past Medical History:  Diagnosis Date   Bipolar 1 disorder (HCC)    Hepatitis C    Hypertension    PTSD (post-traumatic stress disorder)      Medicines No orders of the defined types were placed in this encounter.   I have reviewed the patients home medicines and have made adjustments as needed  Problem List / ED Course: Problem List Items Addressed This Visit   None               Final Clinical Impression(s) / ED Diagnoses Final diagnoses:  None    Rx / DC Orders ED Discharge Orders     None         Shon Baton, MD 04/03/22 [phone]

## 2022-04-03 NOTE — ED Provider Notes (Signed)
Care assumed from Dr. Wilkie Aye.  Patient brought in by EMS being found unresponsive surrounded by drug paraphernalia.  Reportedly he used heroin and methamphetamine.  Has not required any Narcan.  Patient arouses and follows commands.  He is oriented to person and place.   His blood work is reassuring.  Patient is awake and alert and on the phone.  He is oriented to person and place.  He denies any suicidal thoughts or homicidal thoughts.  He is requesting to go to rehab.  Resource guidance given.  Discussed cannot be sent directly to rehab from the ED Advised to stop using illicit substances.   Glynn Octave, MD 04/03/22 1244

## 2022-04-03 NOTE — ED Notes (Signed)
Pt verbally aggressive towards staff. Pt physically defensive behavior. Treatening staff. MD aware. Secuirty at bedside. Pt to be discharge. Pt left without discharge paperwork.

## 2022-04-03 NOTE — ED Triage Notes (Signed)
[facility]. Called out for unresponsive [facility]. Per ems crackpipe and used needles on scene. Difficult to rouse but follows commands. Vss. Nadn.

## 2022-04-05 ENCOUNTER — Other Ambulatory Visit: Payer: Self-pay

## 2022-04-05 ENCOUNTER — Emergency Department (HOSPITAL_COMMUNITY)
Admission: EM | Admit: 2022-04-05 | Discharge: 2022-04-05 | Disposition: A | Discharge status: Home / Self Care | Payer: PRIVATE HEALTH INSURANCE | Attending: Emergency Medicine | Admitting: Emergency Medicine

## 2022-04-05 ENCOUNTER — Encounter (HOSPITAL_COMMUNITY): Payer: Self-pay

## 2022-04-05 ENCOUNTER — Emergency Department (HOSPITAL_COMMUNITY)
Admission: EM | Admit: 2022-04-05 | Discharge: 2022-04-06 | Disposition: A | Discharge status: Home / Self Care | Payer: PRIVATE HEALTH INSURANCE | Source: Home / Self Care | Attending: Emergency Medicine | Admitting: Emergency Medicine

## 2022-04-05 DIAGNOSIS — F1210 Cannabis abuse, uncomplicated: Secondary | ICD-10-CM | POA: Diagnosis not present

## 2022-04-05 DIAGNOSIS — F1110 Opioid abuse, uncomplicated: Secondary | ICD-10-CM | POA: Diagnosis not present

## 2022-04-05 DIAGNOSIS — F1910 Other psychoactive substance abuse, uncomplicated: Secondary | ICD-10-CM

## 2022-04-05 DIAGNOSIS — R21 Rash and other nonspecific skin eruption: Secondary | ICD-10-CM | POA: Insufficient documentation

## 2022-04-05 DIAGNOSIS — F1410 Cocaine abuse, uncomplicated: Secondary | ICD-10-CM | POA: Insufficient documentation

## 2022-04-05 DIAGNOSIS — F1510 Other stimulant abuse, uncomplicated: Secondary | ICD-10-CM | POA: Insufficient documentation

## 2022-04-05 NOTE — ED Triage Notes (Signed)
Pt comes via [facility], pt was found on the ground, overdose on drugs, hx of meth, pt with erratic behavior

## 2022-04-05 NOTE — Discharge Instructions (Addendum)
It was our pleasure to provide your ER care today - we hope that you feel better.  Drink plenty of fluids/stay well hydrated.   Avoid drug use as it is harmful to your physical health and mental well-being.  See resource guide provided in terms of following up with substance use treatment program and/or accessing other social service resources in this area.   For mental health issues and/or crisis, you may go directly to the [facility].   Return  to ER if worse, new symptoms, fever, chest pain, trouble breathing, or other emergency concern.

## 2022-04-05 NOTE — ED Provider Notes (Signed)
[facility]-EMERGENCY DEPT  Provider Note  CSN: [id number] Arrival date & time: 04/05/22 0250  History Chief Complaint  Patient presents with   Drug Problem    Tristan [name] is a 36 y.o. male with history of polysubstance use (meth, opiates,THC) and frequent ED visits for same was brought to the ED via EMS from hotel where he has been staying for erratic behavior. He reports CBD use tonight. He is only concerned for sores on his arms and legs.    Home Medications Prior to Admission medications   Medication Sig Start Date End Date Taking? Authorizing Provider  escitalopram (LEXAPRO) 10 MG tablet Take 1 tablet (10 mg total) by mouth daily. 02/14/22 04/05/22 Yes Singleton, Amy E, MD  gabapentin (NEURONTIN) 100 MG capsule Take 1 capsule (100 mg total) by mouth 3 (three) times daily. 02/14/22 04/05/22 Yes Singleton, Amy E, MD  loratadine (CLARITIN) 10 MG tablet Take 10 mg by mouth daily as needed for allergies.   Yes [provider]  pantoprazole (PROTONIX) 40 MG tablet Take 1 tablet (40 mg total) by mouth daily. 01/23/22  Yes Doda, Traci Sermon, MD  QUEtiapine (SEROQUEL) 100 MG tablet Take 1 tablet (100 mg total) by mouth at bedtime. 02/14/22 04/05/22 Yes Singleton, Amy E, MD  folic acid (FOLVITE) 1 MG tablet Take 1 tablet (1 mg total) by mouth daily. Patient not taking: Reported on 02/27/2022 02/23/22   Joycelyn Das, MD  hydrOXYzine (ATARAX) 25 MG tablet Take 1 tablet (25 mg total) by mouth every 6 (six) hours as needed for anxiety. Patient not taking: Reported on 02/27/2022 02/22/22   Joycelyn Das, MD  loperamide (IMODIUM) 2 MG capsule Take 1 capsule (2 mg total) by mouth as needed for diarrhea or loose stools (diarrhea). Patient not taking: Reported on 02/27/2022 02/22/22   Joycelyn Das, MD  methocarbamol (ROBAXIN) 500 MG tablet Take 1 tablet (500 mg total) by mouth every 8 (eight) hours as needed for muscle spasms. Patient not taking: Reported on 02/27/2022  02/22/22   Joycelyn Das, MD  Multiple Vitamin (MULTIVITAMIN WITH MINERALS) TABS tablet Take 1 tablet by mouth daily. Patient not taking: Reported on 02/27/2022 02/23/22   Joycelyn Das, MD  naproxen (NAPROSYN) 500 MG tablet Take 1 tablet (500 mg total) by mouth 2 (two) times daily as needed (aching, pain, or discomfort). Patient not taking: Reported on 02/27/2022 02/22/22   Joycelyn Das, MD  thiamine 100 MG tablet Take 1 tablet (100 mg total) by mouth daily. Patient not taking: Reported on 02/27/2022 02/23/22   Joycelyn Das, MD     Allergies    Tomato   Review of Systems   Review of Systems Please see HPI for pertinent positives and negatives  Physical Exam BP 136/89   Pulse 61   Temp (!) 97.4 F (36.3 C) (Oral)   Resp 17   Ht 5\' 10"  (1.778 m)   Wt 85.7 kg   SpO2 100%   BMI 27.11 kg/m   Physical Exam Vitals and nursing note reviewed.  Constitutional:      Appearance: Normal appearance.  HENT:     Head: Normocephalic and atraumatic.     Nose: Nose normal.     Mouth/Throat:     Mouth: Mucous membranes are moist.  Eyes:     Extraocular Movements: Extraocular movements intact.     Conjunctiva/sclera: Conjunctivae normal.  Cardiovascular:     Rate and Rhythm: Normal rate.  Pulmonary:     Effort: Pulmonary effort is normal.  Breath sounds: Normal breath sounds.  Abdominal:     General: Abdomen is flat.     Palpations: Abdomen is soft.     Tenderness: There is no abdominal tenderness.  Musculoskeletal:        General: No swelling. Normal range of motion.     Cervical back: Neck supple.  Skin:    General: Skin is warm and dry.     Findings: Rash (chronic appearing sores on arms and legs without infection) present.  Neurological:     General: No focal deficit present.     Mental Status: He is alert.  Psychiatric:     Comments: Restless, cooperative     ED Results / Procedures / Treatments   EKG None  Procedures Procedures  Medications Ordered in the  ED Medications - No data to display  Initial Impression and Plan  Patient with substance abuse here with likely methamphetamine intoxication. He has no reported SI/HI. Will monitor in the ED for sobering. Currently he his hemodynamically stable.   ED Course   Clinical Course as of 04/05/22 0548  Fri Apr 05, 2022  7622 Patient sleeping soundly, vitals remain normal. Plan discharge at 6am.  [CS]    Clinical Course User Index [CS] Pollyann Savoy, MD     MDM Rules/Calculators/A&P Medical Decision Making Problems Addressed: Polysubstance abuse [facility]): chronic illness or injury with exacerbation, progression, or side effects of treatment    Final Clinical Impression(s) / ED Diagnoses Final diagnoses:  Polysubstance abuse [facility])    Rx / DC Orders ED Discharge Orders     None        Pollyann Savoy, MD 04/05/22 [phone]

## 2022-04-05 NOTE — ED Provider Notes (Addendum)
[facility] EMERGENCY DEPARTMENT Provider Note   CSN: [id number] Arrival date & time: 04/05/22  2222     History  Chief Complaint  Patient presents with   Drug Overdose    Tristan [name] is a 36 y.o. male.  Patient with hx polysubstance abuse brought by [employer] from local parking lot where patient was noted on ground with some drugs/drug paraphernalia, and appeared intoxicated/under the influence. Pt indicates uses cocaine and methamphetamine on a regular basis, including drug use tonight.  Pt denies wanting or attempting to harm self. Indicates he feels fine, other than being hungry now. Pt limited historian, erratic - level 5 caveat.   The history is provided by the patient, medical records and the police. The history is limited by the condition of the patient.  Drug Overdose Pertinent negatives include no chest pain, no abdominal pain, no headaches and no shortness of breath.       Home Medications Prior to Admission medications   Medication Sig Start Date End Date Taking? Authorizing Provider  escitalopram (LEXAPRO) 10 MG tablet Take 1 tablet (10 mg total) by mouth daily. 02/14/22 04/05/22  Comer Locket, MD  folic acid (FOLVITE) 1 MG tablet Take 1 tablet (1 mg total) by mouth daily. Patient not taking: Reported on 02/27/2022 02/23/22   Joycelyn Das, MD  gabapentin (NEURONTIN) 100 MG capsule Take 1 capsule (100 mg total) by mouth 3 (three) times daily. 02/14/22 04/05/22  Comer Locket, MD  hydrOXYzine (ATARAX) 25 MG tablet Take 1 tablet (25 mg total) by mouth every 6 (six) hours as needed for anxiety. Patient not taking: Reported on 02/27/2022 02/22/22   Joycelyn Das, MD  loperamide (IMODIUM) 2 MG capsule Take 1 capsule (2 mg total) by mouth as needed for diarrhea or loose stools (diarrhea). Patient not taking: Reported on 02/27/2022 02/22/22   Joycelyn Das, MD  loratadine (CLARITIN) 10 MG tablet Take 10 mg by mouth daily as needed for allergies.     [provider]  methocarbamol (ROBAXIN) 500 MG tablet Take 1 tablet (500 mg total) by mouth every 8 (eight) hours as needed for muscle spasms. Patient not taking: Reported on 02/27/2022 02/22/22   Joycelyn Das, MD  Multiple Vitamin (MULTIVITAMIN WITH MINERALS) TABS tablet Take 1 tablet by mouth daily. Patient not taking: Reported on 02/27/2022 02/23/22   Joycelyn Das, MD  naproxen (NAPROSYN) 500 MG tablet Take 1 tablet (500 mg total) by mouth 2 (two) times daily as needed (aching, pain, or discomfort). Patient not taking: Reported on 02/27/2022 02/22/22   Joycelyn Das, MD  pantoprazole (PROTONIX) 40 MG tablet Take 1 tablet (40 mg total) by mouth daily. 01/23/22   Karsten Ro, MD  QUEtiapine (SEROQUEL) 100 MG tablet Take 1 tablet (100 mg total) by mouth at bedtime. 02/14/22 04/05/22  Comer Locket, MD  thiamine 100 MG tablet Take 1 tablet (100 mg total) by mouth daily. Patient not taking: Reported on 02/27/2022 02/23/22   Joycelyn Das, MD      Allergies    Tomato    Review of Systems   Review of Systems  Constitutional:  Negative for fever.  Respiratory:  Negative for shortness of breath.   Cardiovascular:  Negative for chest pain.  Gastrointestinal:  Negative for abdominal pain.  Musculoskeletal:  Negative for back pain and neck pain.  Neurological:  Negative for headaches.    Physical Exam Updated Vital Signs BP 124/85   Pulse 93   Temp 98.4 F (36.9 C) (  Temporal)   Resp 18   SpO2 100%  Physical Exam Vitals and nursing note reviewed.  Constitutional:      Appearance: Normal appearance. He is well-developed.  HENT:     Head: Atraumatic.     Nose: Nose normal.     Mouth/Throat:     Mouth: Mucous membranes are moist.     Pharynx: Oropharynx is clear.  Eyes:     General: No scleral icterus.    Conjunctiva/sclera: Conjunctivae normal.     Pupils: Pupils are equal, round, and reactive to light.  Neck:     Trachea: No tracheal deviation.     Comments: No stiffness  or rigidity.  Cardiovascular:     Rate and Rhythm: Normal rate and regular rhythm.     Pulses: Normal pulses.     Heart sounds: Normal heart sounds. No murmur heard.    No friction rub. No gallop.  Pulmonary:     Effort: Pulmonary effort is normal. No accessory muscle usage or respiratory distress.     Breath sounds: Normal breath sounds.  Abdominal:     General: Bowel sounds are normal. There is no distension.     Palpations: Abdomen is soft.     Tenderness: There is no abdominal tenderness.  Genitourinary:    Comments: No cva tenderness. Musculoskeletal:        General: No swelling.     Cervical back: Normal range of motion and neck supple. No rigidity or tenderness.     Comments: CTLS spine, non tender, aligned, no step off. Good rom bil extremities without pain or focal bony tenderness.   Skin:    General: Skin is warm and dry.     Findings: No rash.  Neurological:     Mental Status: He is alert.     Comments: Alert, speech clear.  GCS 15. Motor/sens grossly intact bil.   Psychiatric:     Comments: Appears to be intoxicated, erratic behavior. Denies self harm/SI.      ED Results / Procedures / Treatments   Labs (all labs ordered are listed, but only abnormal results are displayed) Labs Reviewed - No data to display  EKG EKG Interpretation  Date/Time:  Friday April 05 2022 22:27:15 EDT Ventricular Rate:  87 PR Interval:  167 QRS Duration: 98 QT Interval:  384 QTC Calculation: 462 R Axis:   86 Text Interpretation: Sinus rhythm Artifact Confirmed by Cathren Laine ([id number]) on 04/05/2022 10:32:29 PM  Radiology No results found.  Procedures Procedures    Medications Ordered in ED Medications - No data to display  ED Course/ Medical Decision Making/ A&P                           Medical Decision Making Problems Addressed: Cocaine abuse [facility]): acute illness or injury with systemic symptoms that poses a threat to life or bodily functions    Details: acute and  chronic Methamphetamine abuse (HCC): acute illness or injury with systemic symptoms that poses a threat to life or bodily functions    Details: acute and chronic Polysubstance abuse (HCC): acute illness or injury with systemic symptoms that poses a threat to life or bodily functions    Details: acute and chronic  Amount and/or Complexity of Data Reviewed Independent Historian:     Details: [employer], hx External Data Reviewed: notes. Labs:  Decision-making details documented in ED Course.   Iv ns. Continuous pulse ox and cardiac monitoring. Labs ordered/sent. Imaging  ordered.   Reviewed nursing notes and prior charts for additional history. External reports reviewed. Additional history from: [employer].  Cardiac monitor: sinus rhythm, rate 88.  Recent prior labs from past couple days reviewed/interpreted by me - hgb c/w baseline, chem normal, uds + cocaine, bzd, thc, and amphetamine.   Po fluids/food.   Anticipate patient metabolize substances with subsequent d/c w community resources, encouraged to f/u in substance use treatment program.   2355 tentative plan above, signed out to Dr Bebe Shaggy.          Final Clinical Impression(s) / ED Diagnoses Final diagnoses:  None    Rx / DC Orders ED Discharge Orders     None          Cathren Laine, MD 04/05/22 2357

## 2022-04-05 NOTE — ED Triage Notes (Signed)
Pt BIB EMS from hotel. Pt presents with erratic behavior and twitching. Per EMS pt took CBD.   144/88 90 hr 16 rr 94 cbg 99% room air

## 2022-04-06 NOTE — ED Provider Notes (Signed)
Patient slept all night without any deterioration.  He is safe for discharge   Zadie Rhine, MD 04/06/22 [phone]

## 2022-04-06 NOTE — ED Notes (Signed)
Pt states that he does not want his discharge paperwork, pt left

## 2022-04-07 ENCOUNTER — Emergency Department (HOSPITAL_COMMUNITY)
Admission: EM | Admit: 2022-04-07 | Discharge: 2022-04-08 | Disposition: A | Discharge status: Home / Self Care | Payer: PRIVATE HEALTH INSURANCE | Attending: Emergency Medicine | Admitting: Emergency Medicine

## 2022-04-07 ENCOUNTER — Encounter (HOSPITAL_COMMUNITY): Payer: Self-pay | Admitting: Emergency Medicine

## 2022-04-07 DIAGNOSIS — F1910 Other psychoactive substance abuse, uncomplicated: Secondary | ICD-10-CM | POA: Diagnosis present

## 2022-04-07 NOTE — ED Triage Notes (Signed)
Pt BIB [facility] from [employer]. Endorses drug use. States he is tired of using.

## 2022-04-08 NOTE — ED Provider Notes (Signed)
[facility]-EMERGENCY DEPT  Provider Note  CSN: [id number] Arrival date & time: 04/07/22 2337  History Chief Complaint  Patient presents with   Addiction Problem    Tristan [name] is a 36 y.o. male well known to this department with frequent visits when intoxicated from a variety of substances. He is known to use meth, opiates and THC. Brought by EMS tonight for acting erratically, similar to previous. He is unable/unwilling to participate in history, just looks around the room and makes odd sounds.    Home Medications Prior to Admission medications   Medication Sig Start Date End Date Taking? Authorizing Provider  escitalopram (LEXAPRO) 10 MG tablet Take 1 tablet (10 mg total) by mouth daily. 02/14/22 04/05/22  Comer Locket, MD  folic acid (FOLVITE) 1 MG tablet Take 1 tablet (1 mg total) by mouth daily. Patient not taking: Reported on 02/27/2022 02/23/22   Joycelyn Das, MD  gabapentin (NEURONTIN) 100 MG capsule Take 1 capsule (100 mg total) by mouth 3 (three) times daily. 02/14/22 04/05/22  Comer Locket, MD  hydrOXYzine (ATARAX) 25 MG tablet Take 1 tablet (25 mg total) by mouth every 6 (six) hours as needed for anxiety. Patient not taking: Reported on 02/27/2022 02/22/22   Joycelyn Das, MD  loperamide (IMODIUM) 2 MG capsule Take 1 capsule (2 mg total) by mouth as needed for diarrhea or loose stools (diarrhea). Patient not taking: Reported on 02/27/2022 02/22/22   Joycelyn Das, MD  loratadine (CLARITIN) 10 MG tablet Take 10 mg by mouth daily as needed for allergies.    [provider]  methocarbamol (ROBAXIN) 500 MG tablet Take 1 tablet (500 mg total) by mouth every 8 (eight) hours as needed for muscle spasms. Patient not taking: Reported on 02/27/2022 02/22/22   Joycelyn Das, MD  Multiple Vitamin (MULTIVITAMIN WITH MINERALS) TABS tablet Take 1 tablet by mouth daily. Patient not taking: Reported on 02/27/2022 02/23/22   Joycelyn Das, MD  naproxen  (NAPROSYN) 500 MG tablet Take 1 tablet (500 mg total) by mouth 2 (two) times daily as needed (aching, pain, or discomfort). Patient not taking: Reported on 02/27/2022 02/22/22   Joycelyn Das, MD  pantoprazole (PROTONIX) 40 MG tablet Take 1 tablet (40 mg total) by mouth daily. 01/23/22   Karsten Ro, MD  QUEtiapine (SEROQUEL) 100 MG tablet Take 1 tablet (100 mg total) by mouth at bedtime. 02/14/22 04/05/22  Comer Locket, MD  thiamine 100 MG tablet Take 1 tablet (100 mg total) by mouth daily. Patient not taking: Reported on 02/27/2022 02/23/22   Joycelyn Das, MD     Allergies    Tomato   Review of Systems   Review of Systems Please see HPI for pertinent positives and negatives  Physical Exam BP (!) 141/111   Pulse 80   Temp 98.1 F (36.7 C) (Oral)   Resp 18   SpO2 99%   Physical Exam Vitals and nursing note reviewed.  HENT:     Head: Normocephalic.     Nose: Nose normal.  Eyes:     Extraocular Movements: Extraocular movements intact.  Pulmonary:     Effort: Pulmonary effort is normal.  Musculoskeletal:        General: Normal range of motion.     Cervical back: Neck supple.  Skin:    Findings: No rash (on exposed skin).  Neurological:     Mental Status: He is alert.     Comments: Moves all extremities, does not participate in exam  Psychiatric:     Comments: restless     ED Results / Procedures / Treatments   EKG None  Procedures Procedures  Medications Ordered in the ED Medications - No data to display  Initial Impression and Plan  Patient with likely drug intoxication, will continue to monitor in the ED.   ED Course   Clinical Course as of 04/08/22 0411  Mon Apr 08, 2022  0411 Patient up walking in the department, requesting blanket and something to eat. Stable for discharge.  [CS]    Clinical Course User Index [CS] Pollyann Savoy, MD     MDM Rules/Calculators/A&P Medical Decision Making Problems Addressed: Polysubstance abuse [facility]):  chronic illness or injury    Final Clinical Impression(s) / ED Diagnoses Final diagnoses:  Polysubstance abuse [facility])    Rx / DC Orders ED Discharge Orders     None        Pollyann Savoy, MD 04/08/22 [phone]

## 2022-04-10 ENCOUNTER — Emergency Department (HOSPITAL_COMMUNITY)
Admission: EM | Admit: 2022-04-10 | Discharge: 2022-04-10 | Disposition: A | Discharge status: Home / Self Care | Payer: PRIVATE HEALTH INSURANCE | Attending: Emergency Medicine | Admitting: Emergency Medicine

## 2022-04-10 ENCOUNTER — Encounter (HOSPITAL_COMMUNITY): Payer: Self-pay

## 2022-04-10 ENCOUNTER — Other Ambulatory Visit: Payer: Self-pay

## 2022-04-10 ENCOUNTER — Ambulatory Visit (HOSPITAL_COMMUNITY)
Admission: RE | Admit: 2022-04-10 | Discharge: 2022-04-10 | Disposition: A | Discharge status: Home / Self Care | Payer: PRIVATE HEALTH INSURANCE | Attending: Psychiatry | Admitting: Psychiatry

## 2022-04-10 DIAGNOSIS — R109 Unspecified abdominal pain: Secondary | ICD-10-CM | POA: Insufficient documentation

## 2022-04-10 DIAGNOSIS — I10 Essential (primary) hypertension: Secondary | ICD-10-CM | POA: Diagnosis not present

## 2022-04-10 DIAGNOSIS — Z79899 Other long term (current) drug therapy: Secondary | ICD-10-CM | POA: Diagnosis not present

## 2022-04-10 DIAGNOSIS — R519 Headache, unspecified: Secondary | ICD-10-CM | POA: Diagnosis present

## 2022-04-10 LAB — HIV ANTIBODY (ROUTINE TESTING W REFLEX): HIV Screen 4th Generation wRfx: NONREACTIVE

## 2022-04-10 MED ORDER — ESCITALOPRAM OXALATE 10 MG PO TABS
10.0000 mg | ORAL_TABLET | ORAL | Status: AC
Start: 2022-04-10 — End: 2022-04-10
  Administered 2022-04-10: 10 mg via ORAL
  Filled 2022-04-10: qty 1

## 2022-04-10 MED ORDER — ACETAMINOPHEN 325 MG PO TABS
650.0000 mg | ORAL_TABLET | Freq: Once | ORAL | Status: AC
  Administered 2022-04-10: 650 mg via ORAL
  Filled 2022-04-10: qty 2

## 2022-04-10 MED ORDER — QUETIAPINE FUMARATE 50 MG PO TABS
25.0000 mg | ORAL_TABLET | Freq: Every day | ORAL | Status: DC
  Filled 2022-04-10: qty 1
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
## 2022-04-10 NOTE — Discharge Instructions (Signed)
Given you information for outpatient resources for substance use disorder, as well as information for shelters within the area.  Please review  Come back to the emergency department if you develop chest pain, shortness of breath, severe abdominal pain, uncontrolled nausea, vomiting, diarrhea.

## 2022-04-10 NOTE — ED Triage Notes (Signed)
[facility] states he was stealing from store and when the cops were going to arrest him for trespassing he started having abdominal pain.

## 2022-04-10 NOTE — ED Provider Notes (Addendum)
[facility]-EMERGENCY DEPT Provider Note   CSN: [id number] Arrival date & time: 04/10/22  7619     History  Chief Complaint  Patient presents with   Abdominal Pain    Tristan [name] is a 36 y.o. male.  HPI  Medical history including hypertension, bipolar, hepatitis C, polysubstance dependency presents with complaints of a headache.  Initially he was encoded for stomach pain after he was found trespassing, but when I assessed him patient states that he is not having any stomach pain but he is having a headache.  Patient states headache started earlier today, states that happened after he injected himself with an unknown substance.  He states that he feels a throbbing sensation in front of his head, does not endorse paresthesias or weakness upper or lower extremities no lightheaded or dizziness.  He denies any head trauma, he is on anticoag's, he denies suicidal homicidal ideations.  He states that he is concerned that when he injected may have had HIV and like to be tested for this.    Home Medications Prior to Admission medications   Medication Sig Start Date End Date Taking? Authorizing Provider  Buprenorphine HCl-Naloxone HCl 8-2 MG FILM Place 1 strip under the tongue daily. 04/07/22   [provider]  escitalopram (LEXAPRO) 10 MG tablet Take 1 tablet (10 mg total) by mouth daily. 02/14/22 04/05/22  Comer Locket, MD  folic acid (FOLVITE) 1 MG tablet Take 1 tablet (1 mg total) by mouth daily. Patient not taking: Reported on 02/27/2022 02/23/22   Joycelyn Das, MD  gabapentin (NEURONTIN) 100 MG capsule Take 1 capsule (100 mg total) by mouth 3 (three) times daily. 02/14/22 04/05/22  Comer Locket, MD  hydrOXYzine (ATARAX) 25 MG tablet Take 1 tablet (25 mg total) by mouth every 6 (six) hours as needed for anxiety. Patient not taking: Reported on 02/27/2022 02/22/22   Joycelyn Das, MD  loperamide (IMODIUM) 2 MG capsule Take 1 capsule (2 mg total)  by mouth as needed for diarrhea or loose stools (diarrhea). Patient not taking: Reported on 02/27/2022 02/22/22   Joycelyn Das, MD  loratadine (CLARITIN) 10 MG tablet Take 10 mg by mouth daily as needed for allergies.    [provider]  methocarbamol (ROBAXIN) 500 MG tablet Take 1 tablet (500 mg total) by mouth every 8 (eight) hours as needed for muscle spasms. Patient not taking: Reported on 02/27/2022 02/22/22   Joycelyn Das, MD  Multiple Vitamin (MULTIVITAMIN WITH MINERALS) TABS tablet Take 1 tablet by mouth daily. Patient not taking: Reported on 02/27/2022 02/23/22   Joycelyn Das, MD  naproxen (NAPROSYN) 500 MG tablet Take 1 tablet (500 mg total) by mouth 2 (two) times daily as needed (aching, pain, or discomfort). Patient not taking: Reported on 02/27/2022 02/22/22   Joycelyn Das, MD  pantoprazole (PROTONIX) 40 MG tablet Take 1 tablet (40 mg total) by mouth daily. 01/23/22   Karsten Ro, MD  QUEtiapine (SEROQUEL) 100 MG tablet Take 1 tablet (100 mg total) by mouth at bedtime. 02/14/22 04/05/22  Comer Locket, MD  thiamine 100 MG tablet Take 1 tablet (100 mg total) by mouth daily. Patient not taking: Reported on 02/27/2022 02/23/22   Joycelyn Das, MD      Allergies    Tomato    Review of Systems   Review of Systems  Constitutional:  Negative for chills and fever.  Respiratory:  Negative for shortness of breath.   Cardiovascular:  Negative for chest pain.  Gastrointestinal:  Negative for abdominal pain.  Neurological:  Positive for headaches.    Physical Exam Updated Vital Signs BP (!) 145/92 (BP Location: Right Arm)   Pulse 90   Temp 97.8 F (36.6 C) (Oral)   Resp 16   Ht 5\' 10"  (1.778 m)   Wt 85.7 kg   SpO2 100%   BMI 27.11 kg/m  Physical Exam Vitals and nursing note reviewed.  Constitutional:      General: He is not in acute distress.    Appearance: He is not ill-appearing.  HENT:     Head: Normocephalic and atraumatic.     Comments: No deformity of the head  present no raccoon eyes or battle sign noted.    Nose: No congestion.     Mouth/Throat:     Mouth: Mucous membranes are moist.     Pharynx: Oropharynx is clear.     Comments: No trismus no torticollis no oral trauma noted. Eyes:     Extraocular Movements: Extraocular movements intact.     Conjunctiva/sclera: Conjunctivae normal.     Pupils: Pupils are equal, round, and reactive to light.  Cardiovascular:     Rate and Rhythm: Normal rate and regular rhythm.     Pulses: Normal pulses.     Heart sounds: No murmur heard.    No friction rub. No gallop.  Pulmonary:     Effort: No respiratory distress.     Breath sounds: No wheezing, rhonchi or rales.  Abdominal:     Palpations: Abdomen is soft.     Tenderness: There is no abdominal tenderness. There is no right CVA tenderness or left CVA tenderness.  Musculoskeletal:     Comments: Moving all 4 extremities without difficulty.  Skin:    General: Skin is warm and dry.  Neurological:     Mental Status: He is alert.     GCS: GCS eye subscore is 4. GCS verbal subscore is 5. GCS motor subscore is 6.     Cranial Nerves: No facial asymmetry.     Sensory: Sensation is intact.     Motor: No weakness.     Coordination: Romberg sign negative. Finger-Nose-Finger Test normal.     Gait: Gait is intact.     Comments: No facial asymmetry no difficulty with word finding following two-step commands no unilateral weakness present.  Psychiatric:        Mood and Affect: Mood normal.     Comments: Patient is responding appropriately, does not appear to be responding to internal stimuli, does not endorse suicidal or homicidal ideations.     ED Results / Procedures / Treatments   Labs (all labs ordered are listed, but only abnormal results are displayed) Labs Reviewed  HIV ANTIBODY (ROUTINE TESTING W REFLEX)    EKG None  Radiology No results found.  Procedures Procedures    Medications Ordered in ED Medications  acetaminophen (TYLENOL)  tablet 650 mg (650 mg Oral Given 04/10/22 0549)    ED Course/ Medical Decision Making/ A&P                           Medical Decision Making Risk OTC drugs. Prescription drug management.   This patient presents to the ED for concern of headache, this involves an extensive number of treatment options, and is a complaint that carries with it a high risk of complications and morbidity.  The differential diagnosis includes intracranial head bleed, CVA, meningitis, migraine    Additional history  obtained:  Additional history obtained from N/A External records from outside source obtained and reviewed including previous ED notes   Co morbidities that complicate the patient evaluation  Polysubstance dependency  Social Determinants of Health:  Homelessness    Lab Tests:  I Ordered, and personally interpreted labs.  The pertinent results include: HIV pending at this time   Imaging Studies ordered:  I ordered imaging studies including N/A I independently visualized and interpreted imaging which showed N/A I agree with the radiologist interpretation   Cardiac Monitoring:  The patient was maintained on a cardiac monitor.  I personally viewed and interpreted the cardiac monitored which showed an underlying rhythm of: N/A   Medicines ordered and prescription drug management:  I ordered medication including Tylenol for pain I have reviewed the patients home medicines and have made adjustments as needed  Critical Interventions:  N/A   Reevaluation:  Presents with a headache, he had a benign physical exam, will provide him with Tylenol for his headache and obtain lab work for HIV, will defer on hepatitis as he has active hep C at this time.  Patient was ambulating around the hospital looking for a blanket.  Reassessed having no complaints ready for discharge at this time,   Consultations Obtained:  N/A    Test Considered:  CT head-we will defer as my  suspicion for intracranial head bleed is very low at this time, no traumatic injury, not on anticoag's, no focal deficits.    Rule out  Low suspicion for CVA she has no focal deficit present my exam.  Low suspicion for dissection of the vertebral or carotid artery as presentation atypical of etiology.  Low suspicion for meningitis as she has no meningeal sign present.  Low suspicion for psychiatric emergency does not endorse suicidal homicidal ideations does not appear to be responding to internal stimuli.  Low suspicion for withdrawal nontremulous on my exam vital signs reassuring.     Dispostion and problem list  After consideration of the diagnostic results and the patients response to treatment, I feel that the patent would benefit from discharge.  Headache-likely multifactorial, most consistent with a tension-like headache, but also suspect there is secondary gain involved, will provide him with outpatient resources.       Addendum-prior to discharge patient requested to have his Lexapro prior to discharge, will provide to the patient and discharged home.     Final Clinical Impression(s) / ED Diagnoses Final diagnoses:  Nonintractable headache, unspecified chronicity pattern, unspecified headache type    Rx / DC Orders ED Discharge Orders     None         Carroll Sage, PA-C 04/10/22 0610    Carroll Sage, PA-C 04/10/22 7672    Dione Booze, MD 04/10/22 0639    Carroll Sage, PA-C 04/10/22 0947    Dione Booze, MD 04/10/22 [phone]

## 2022-05-09 ENCOUNTER — Inpatient Hospital Stay (HOSPITAL_COMMUNITY)
Admission: EM | Admit: 2022-05-09 | Discharge: 2022-05-13 | DRG: 603 | Discharge status: Against Medical Advice | Payer: PRIVATE HEALTH INSURANCE | Attending: Family Medicine | Admitting: Family Medicine

## 2022-05-09 ENCOUNTER — Emergency Department (HOSPITAL_COMMUNITY): Payer: PRIVATE HEALTH INSURANCE

## 2022-05-09 DIAGNOSIS — L0390 Cellulitis, unspecified: Secondary | ICD-10-CM | POA: Diagnosis not present

## 2022-05-09 DIAGNOSIS — L03114 Cellulitis of left upper limb: Secondary | ICD-10-CM | POA: Diagnosis not present

## 2022-05-09 DIAGNOSIS — F1910 Other psychoactive substance abuse, uncomplicated: Secondary | ICD-10-CM | POA: Diagnosis present

## 2022-05-09 DIAGNOSIS — Z5329 Procedure and treatment not carried out because of patient's decision for other reasons: Secondary | ICD-10-CM | POA: Diagnosis present

## 2022-05-09 DIAGNOSIS — F319 Bipolar disorder, unspecified: Secondary | ICD-10-CM | POA: Diagnosis present

## 2022-05-09 DIAGNOSIS — B1920 Unspecified viral hepatitis C without hepatic coma: Secondary | ICD-10-CM | POA: Diagnosis present

## 2022-05-09 DIAGNOSIS — I10 Essential (primary) hypertension: Secondary | ICD-10-CM | POA: Diagnosis present

## 2022-05-09 DIAGNOSIS — F4310 Post-traumatic stress disorder, unspecified: Secondary | ICD-10-CM | POA: Diagnosis present

## 2022-05-09 DIAGNOSIS — D649 Anemia, unspecified: Secondary | ICD-10-CM | POA: Diagnosis present

## 2022-05-09 DIAGNOSIS — F17210 Nicotine dependence, cigarettes, uncomplicated: Secondary | ICD-10-CM | POA: Diagnosis present

## 2022-05-09 DIAGNOSIS — F1210 Cannabis abuse, uncomplicated: Secondary | ICD-10-CM | POA: Diagnosis present

## 2022-05-09 DIAGNOSIS — Z91018 Allergy to other foods: Secondary | ICD-10-CM

## 2022-05-09 DIAGNOSIS — L02414 Cutaneous abscess of left upper limb: Secondary | ICD-10-CM | POA: Diagnosis present

## 2022-05-09 DIAGNOSIS — M609 Myositis, unspecified: Secondary | ICD-10-CM | POA: Diagnosis present

## 2022-05-09 DIAGNOSIS — F1990 Other psychoactive substance use, unspecified, uncomplicated: Principal | ICD-10-CM

## 2022-05-09 LAB — CBC WITH DIFFERENTIAL/PLATELET
Abs Immature Granulocytes: 0.19 10*3/uL — ABNORMAL HIGH (ref 0.00–0.07)
Basophils Absolute: 0.1 10*3/uL (ref 0.0–0.1)
Basophils Relative: 1 %
Eosinophils Absolute: 0.3 10*3/uL (ref 0.0–0.5)
Eosinophils Relative: 2 %
HCT: 29.2 % — ABNORMAL LOW (ref 39.0–52.0)
Hemoglobin: 10.2 g/dL — ABNORMAL LOW (ref 13.0–17.0)
Immature Granulocytes: 1 %
Lymphocytes Relative: 6 %
Lymphs Abs: 1.4 10*3/uL (ref 0.7–4.0)
MCH: 25.9 pg — ABNORMAL LOW (ref 26.0–34.0)
MCHC: 34.9 g/dL (ref 30.0–36.0)
MCV: 74.1 fL — ABNORMAL LOW (ref 80.0–100.0)
Monocytes Absolute: 1.1 10*3/uL — ABNORMAL HIGH (ref 0.1–1.0)
Monocytes Relative: 5 %
Neutro Abs: 18.7 10*3/uL — ABNORMAL HIGH (ref 1.7–7.7)
Neutrophils Relative %: 85 %
Platelets: 328 10*3/uL (ref 150–400)
RBC: 3.94 MIL/uL — ABNORMAL LOW (ref 4.22–5.81)
RDW: 14.2 % (ref 11.5–15.5)
WBC: 21.8 10*3/uL — ABNORMAL HIGH (ref 4.0–10.5)
nRBC: 0 % (ref 0.0–0.2)

## 2022-05-09 NOTE — ED Notes (Signed)
X1 no answer for vitals recheck

## 2022-05-09 NOTE — ED Triage Notes (Signed)
Pt c/o abscess/swelling to L arm onset approx 2 days ago, swelling noted to L forearm, halfway up L bicep, pt states pain is starting to "shoot up" to axillary area. Pt states last drug use was today, unsure if needles are clean.

## 2022-05-09 NOTE — ED Provider Triage Note (Signed)
Emergency Medicine Provider Triage Evaluation Note  Tristan [name] , a 36 y.o. male  was evaluated in triage.  Pt complains of left arm swelling, erythema, and pain for he past 1-2 days. Patient reports active IVDU of opioids. He is unsure if the needles were clean. Denies any fever.  Review of Systems  Positive:  Negative:   Physical Exam  BP 119/65 (BP Location: Right Arm)   Pulse (!) 56   Temp 98.4 F (36.9 C) (Oral)   Resp 18   SpO2 98%  Gen:   Awake, no distress   Resp:  Normal effort  MSK:   Significant swelling and erythema noted to the left humerus, elbow, and lower arm. Compartments are firm, but pliable. Pulses intact. The patient can still flex and extend the elbow.  Other:    Medical Decision Making  Medically screening exam initiated at 9:45 PM.  Appropriate orders placed.  Tristan [name] was informed that the remainder of the evaluation will be completed by another provider, this initial triage assessment does not replace that evaluation, and the importance of remaining in the ED until their evaluation is complete.  Alerted nursing to make this a level 2. Labs aren't consistent with sepsis. Will order labs and imaging. Nursing aware that patient is needs to be roomed.    Tristan Rich, PA-C 05/09/22 2149

## 2022-05-10 ENCOUNTER — Inpatient Hospital Stay (HOSPITAL_COMMUNITY): Payer: PRIVATE HEALTH INSURANCE

## 2022-05-10 ENCOUNTER — Encounter (HOSPITAL_COMMUNITY): Payer: Self-pay | Admitting: Internal Medicine

## 2022-05-10 ENCOUNTER — Other Ambulatory Visit: Payer: Self-pay

## 2022-05-10 DIAGNOSIS — L02414 Cutaneous abscess of left upper limb: Secondary | ICD-10-CM | POA: Diagnosis present

## 2022-05-10 DIAGNOSIS — Z5329 Procedure and treatment not carried out because of patient's decision for other reasons: Secondary | ICD-10-CM | POA: Diagnosis present

## 2022-05-10 DIAGNOSIS — F1910 Other psychoactive substance abuse, uncomplicated: Secondary | ICD-10-CM | POA: Diagnosis present

## 2022-05-10 DIAGNOSIS — F319 Bipolar disorder, unspecified: Secondary | ICD-10-CM | POA: Diagnosis present

## 2022-05-10 DIAGNOSIS — L03114 Cellulitis of left upper limb: Secondary | ICD-10-CM | POA: Diagnosis present

## 2022-05-10 DIAGNOSIS — L0390 Cellulitis, unspecified: Secondary | ICD-10-CM | POA: Diagnosis present

## 2022-05-10 DIAGNOSIS — F4310 Post-traumatic stress disorder, unspecified: Secondary | ICD-10-CM | POA: Diagnosis present

## 2022-05-10 DIAGNOSIS — F1210 Cannabis abuse, uncomplicated: Secondary | ICD-10-CM | POA: Diagnosis present

## 2022-05-10 DIAGNOSIS — I10 Essential (primary) hypertension: Secondary | ICD-10-CM | POA: Diagnosis present

## 2022-05-10 DIAGNOSIS — F1990 Other psychoactive substance use, unspecified, uncomplicated: Secondary | ICD-10-CM

## 2022-05-10 DIAGNOSIS — F17210 Nicotine dependence, cigarettes, uncomplicated: Secondary | ICD-10-CM | POA: Diagnosis present

## 2022-05-10 DIAGNOSIS — L089 Local infection of the skin and subcutaneous tissue, unspecified: Secondary | ICD-10-CM | POA: Diagnosis not present

## 2022-05-10 DIAGNOSIS — Z91018 Allergy to other foods: Secondary | ICD-10-CM | POA: Diagnosis not present

## 2022-05-10 DIAGNOSIS — D649 Anemia, unspecified: Secondary | ICD-10-CM | POA: Diagnosis present

## 2022-05-10 DIAGNOSIS — M609 Myositis, unspecified: Secondary | ICD-10-CM | POA: Diagnosis present

## 2022-05-10 DIAGNOSIS — F17200 Nicotine dependence, unspecified, uncomplicated: Secondary | ICD-10-CM | POA: Diagnosis not present

## 2022-05-10 DIAGNOSIS — B1920 Unspecified viral hepatitis C without hepatic coma: Secondary | ICD-10-CM | POA: Diagnosis present

## 2022-05-10 LAB — CBC
HCT: 27.8 % — ABNORMAL LOW (ref 39.0–52.0)
Hemoglobin: 9.4 g/dL — ABNORMAL LOW (ref 13.0–17.0)
MCH: 25.5 pg — ABNORMAL LOW (ref 26.0–34.0)
MCHC: 33.8 g/dL (ref 30.0–36.0)
MCV: 75.5 fL — ABNORMAL LOW (ref 80.0–100.0)
Platelets: 290 10*3/uL (ref 150–400)
RBC: 3.68 MIL/uL — ABNORMAL LOW (ref 4.22–5.81)
RDW: 14.6 % (ref 11.5–15.5)
WBC: 18.8 10*3/uL — ABNORMAL HIGH (ref 4.0–10.5)
nRBC: 0 % (ref 0.0–0.2)

## 2022-05-10 LAB — LACTIC ACID, PLASMA: Lactic Acid, Venous: 1.2 mmol/L (ref 0.5–1.9)

## 2022-05-10 LAB — COMPREHENSIVE METABOLIC PANEL
ALT: 22 U/L (ref 0–44)
ALT: 19 U/L (ref 0–44)
AST: 27 U/L (ref 15–41)
AST: 24 U/L (ref 15–41)
Albumin: 3.1 g/dL — ABNORMAL LOW (ref 3.5–5.0)
Albumin: 2.6 g/dL — ABNORMAL LOW (ref 3.5–5.0)
Alkaline Phosphatase: 81 U/L (ref 38–126)
Alkaline Phosphatase: 61 U/L (ref 38–126)
Anion gap: 8 (ref 5–15)
Anion gap: 7 (ref 5–15)
BUN: 17 mg/dL (ref 6–20)
BUN: 14 mg/dL (ref 6–20)
CO2: 25 mmol/L (ref 22–32)
CO2: 22 mmol/L (ref 22–32)
Calcium: 8.8 mg/dL — ABNORMAL LOW (ref 8.9–10.3)
Calcium: 8 mg/dL — ABNORMAL LOW (ref 8.9–10.3)
Chloride: 98 mmol/L (ref 98–111)
Chloride: 99 mmol/L (ref 98–111)
Creatinine, Ser: 1.02 mg/dL (ref 0.61–1.24)
Creatinine, Ser: 0.86 mg/dL (ref 0.61–1.24)
GFR, Estimated: >60 mL/min (ref >60)
GFR, Estimated: >60 mL/min (ref >60)
Glucose, Bld: 98 mg/dL (ref 70–99)
Glucose, Bld: 99 mg/dL (ref 70–99)
Potassium: 3.9 mmol/L (ref 3.5–5.1)
Potassium: 4 mmol/L (ref 3.5–5.1)
Sodium: 131 mmol/L — ABNORMAL LOW (ref 135–145)
Sodium: 128 mmol/L — ABNORMAL LOW (ref 135–145)
Total Bilirubin: 0.5 mg/dL (ref 0.3–1.2)
Total Bilirubin: 0.3 mg/dL (ref 0.3–1.2)
Total Protein: 7.5 g/dL (ref 6.5–8.1)
Total Protein: 6.1 g/dL — ABNORMAL LOW (ref 6.5–8.1)

## 2022-05-10 LAB — C-REACTIVE PROTEIN: CRP: 20.4 mg/dL — ABNORMAL HIGH (ref <1.0)

## 2022-05-10 LAB — SEDIMENTATION RATE: Sed Rate: 66 mm/hr — ABNORMAL HIGH (ref 0–16)

## 2022-05-10 MED ORDER — VANCOMYCIN HCL 1250 MG/250ML IV SOLN
1250.0000 mg | Freq: Two times a day (BID) | INTRAVENOUS | Status: DC
  Administered 2022-05-10 – 2022-05-12 (×4): 1250 mg via INTRAVENOUS
  Filled 2022-05-10 (×4): qty 250

## 2022-05-10 MED ORDER — ACETAMINOPHEN 325 MG PO TABS
650.0000 mg | ORAL_TABLET | Freq: Four times a day (QID) | ORAL | Status: DC | PRN
  Administered 2022-05-10: 650 mg via ORAL
  Filled 2022-05-10: qty 2

## 2022-05-10 MED ORDER — PIPERACILLIN-TAZOBACTAM 3.375 G IVPB 30 MIN
3.3750 g | Freq: Once | INTRAVENOUS | Status: AC
  Administered 2022-05-10: 3.375 g via INTRAVENOUS
  Filled 2022-05-10: qty 50

## 2022-05-10 MED ORDER — FENTANYL CITRATE PF 50 MCG/ML IJ SOSY
100.0000 ug | PREFILLED_SYRINGE | Freq: Once | INTRAMUSCULAR | Status: DC
  Filled 2022-05-10: qty 2

## 2022-05-10 MED ORDER — HYDROXYZINE HCL 25 MG PO TABS
25.0000 mg | ORAL_TABLET | Freq: Four times a day (QID) | ORAL | Status: DC | PRN
Start: 2022-05-10 — End: 2022-05-13
  Administered 2022-05-10 – 2022-05-13 (×2): 25 mg via ORAL
  Filled 2022-05-10 (×2): qty 1

## 2022-05-10 MED ORDER — VANCOMYCIN HCL 1250 MG/250ML IV SOLN: 1250.0000 mg | Freq: Two times a day (BID) | INTRAVENOUS | Status: DC

## 2022-05-10 MED ORDER — ACETAMINOPHEN 650 MG RE SUPP: 650.0000 mg | Freq: Four times a day (QID) | RECTAL | Status: DC | PRN

## 2022-05-10 MED ORDER — ESCITALOPRAM OXALATE 10 MG PO TABS: 10.0000 mg | ORAL_TABLET | Freq: Every day | ORAL | Status: DC

## 2022-05-10 MED ORDER — ONDANSETRON HCL 4 MG/2ML IJ SOLN
4.0000 mg | Freq: Once | INTRAMUSCULAR | Status: DC
  Filled 2022-05-10: qty 2

## 2022-05-10 MED ORDER — SODIUM CHLORIDE 0.9 % IV SOLN
2.0000 g | Freq: Once | INTRAVENOUS | Status: AC
  Administered 2022-05-10: 2 g via INTRAVENOUS
  Filled 2022-05-10: qty 12.5

## 2022-05-10 MED ORDER — GADOBUTROL 1 MMOL/ML IV SOLN
7.7000 mL | Freq: Once | INTRAVENOUS | Status: AC | PRN
  Administered 2022-05-10: 7.7 mL via INTRAVENOUS

## 2022-05-10 MED ORDER — LACTATED RINGERS IV SOLN: INTRAVENOUS | Status: AC

## 2022-05-10 MED ORDER — BUPRENORPHINE HCL-NALOXONE HCL 8-2 MG SL SUBL
1.0000 | SUBLINGUAL_TABLET | Freq: Every day | SUBLINGUAL | Status: DC
  Administered 2022-05-10: 1 via SUBLINGUAL
  Filled 2022-05-10 (×3): qty 1

## 2022-05-10 MED ORDER — SODIUM CHLORIDE 0.9 % IV SOLN
2.0000 g | Freq: Three times a day (TID) | INTRAVENOUS | Status: DC
  Administered 2022-05-10 – 2022-05-11 (×3): 2 g via INTRAVENOUS
  Filled 2022-05-10 (×3): qty 12.5

## 2022-05-10 MED ORDER — NAPROXEN 250 MG PO TABS
500.0000 mg | ORAL_TABLET | Freq: Two times a day (BID) | ORAL | Status: DC | PRN
  Administered 2022-05-10: 500 mg via ORAL
  Filled 2022-05-10: qty 2

## 2022-05-10 MED ORDER — GABAPENTIN 100 MG PO CAPS: 100.0000 mg | ORAL_CAPSULE | Freq: Three times a day (TID) | ORAL | Status: DC

## 2022-05-10 MED ORDER — VANCOMYCIN HCL 1500 MG/300ML IV SOLN
1500.0000 mg | INTRAVENOUS | Status: AC
  Administered 2022-05-10: 1500 mg via INTRAVENOUS
  Filled 2022-05-10: qty 300

## 2022-05-10 NOTE — ED Notes (Addendum)
This NT tried to wake pt for EKG and pt was slow in response to verbal direction.

## 2022-05-10 NOTE — ED Provider Notes (Signed)
EKG Interpretation  Date/Time:  Friday May 10 2022 04:06:15 EDT Ventricular Rate:  70 PR Interval:  160 QRS Duration: 96 QT Interval:  404 QTC Calculation: 436 R Axis:   88 Text Interpretation: Normal sinus rhythm Normal ECG Confirmed by Zadie Rhine ([id number]) on 05/10/2022 4:14:23 AM       Patient resting comfortably, no acute distress He is awaiting admission   Zadie Rhine, MD 05/10/22 [phone]

## 2022-05-10 NOTE — Progress Notes (Addendum)
Pharmacy Antibiotic Note  Tristan [name] is a 36 y.o. male admitted on 05/09/2022 with L arm cellulitis.  Pharmacy has been consulted for Vancomycin and Cefepime dosing.  Plan: Cefepime 2gm IV q8h Vancomycin 1500mg  IV now then 1250 mg IV Q 12 hrs. Goal AUC 400-550. Expected AUC: 500 SCr used: 1.02 Will f/u renal function, micro data, and pt's clinical condition Vanc levels prn   Height: 5\' 10"  (177.8 cm) Weight: 77.1 kg (170 lb) IBW/kg (Calculated) : 73  Temp (24hrs), Avg:98.6 F (37 C), Min:98.4 F (36.9 C), Max:98.7 F (37.1 C)  Recent Labs  Lab 05/09/22 2250  WBC 21.8*  CREATININE 1.02  LATICACIDVEN 1.2    Estimated Creatinine Clearance: 103.4 mL/min (by C-G formula based on SCr of 1.02 mg/dL).    Allergies  Allergen Reactions   Tomato Itching and Other (See Comments)    Throat and tongue itches    Antimicrobials this admission: 8/18 Zosyn x 1 8/18  Vanc >>  8/18 Cefepime >>  Microbiology results: 8/17 BCx:   Thank you for allowing pharmacy to be a part of this patient's care.  9/18, PharmD, BCPS Please see amion for complete clinical pharmacist phone list 05/10/2022 3:10 AM

## 2022-05-10 NOTE — ED Notes (Signed)
This EMT helped this patient to the restroom. Upon arrival back from the restroom, the patient asked if he could have something for pain.

## 2022-05-10 NOTE — H&P (Signed)
History and Physical    Tristan [name] LXB:[id number] DOB: [birth year] DOA: 05/09/2022  PCP: Pcp, No  Patient coming from: Home.  Chief Complaint: Left forearm pain.  HPI: Tristan [name] is a 36 y.o. male with history of IV drug abuse injected drugs into his left forearm about a week ago and noticed increasing swelling particular the last 48 hours.  Denies any subjective feeling of fever or chills.  Denies any trauma or insect bites.  ED Course: In the ER x-rays of the left forearm show soft tissue swelling.  No definite drainable abscess seen on clinical exam.  Lab work does show elevated CRP and leukocytosis.  Patient had blood cultures drawn and started on IV antibiotics.  Review of Systems: As per HPI, rest all negative.   Past Medical History:  Diagnosis Date   Bipolar 1 disorder (HCC)    Hepatitis C    Hypertension    PTSD (post-traumatic stress disorder)     Past Surgical History:  Procedure Laterality Date   NO PAST SURGERIES       reports that he has been smoking cigarettes. He has been smoking an average of .5 packs per day. He has never used smokeless tobacco. He reports current alcohol use. He reports current drug use. Drugs: Marijuana, Heroin, Cocaine, Methamphetamines, and Morphine.  Allergies  Allergen Reactions   Tomato Itching and Other (See Comments)    Throat and tongue itches    Family History  Problem Relation Age of Onset   Other Father    Psychiatric Illness Father     Prior to Admission medications   Medication Sig Start Date End Date Taking? Authorizing Provider  escitalopram (LEXAPRO) 10 MG tablet Take 1 tablet (10 mg total) by mouth daily. Patient not taking: Reported on 05/10/2022 02/14/22 04/05/22  Comer Locket, MD  folic acid (FOLVITE) 1 MG tablet Take 1 tablet (1 mg total) by mouth daily. Patient not taking: Reported on 02/27/2022 02/23/22   Joycelyn Das, MD  gabapentin (NEURONTIN) 100 MG capsule Take 1 capsule (100  mg total) by mouth 3 (three) times daily. Patient not taking: Reported on 05/10/2022 02/14/22 04/05/22  Comer Locket, MD  hydrOXYzine (ATARAX) 25 MG tablet Take 1 tablet (25 mg total) by mouth every 6 (six) hours as needed for anxiety. Patient not taking: Reported on 02/27/2022 02/22/22   Joycelyn Das, MD  loperamide (IMODIUM) 2 MG capsule Take 1 capsule (2 mg total) by mouth as needed for diarrhea or loose stools (diarrhea). Patient not taking: Reported on 02/27/2022 02/22/22   Joycelyn Das, MD  methocarbamol (ROBAXIN) 500 MG tablet Take 1 tablet (500 mg total) by mouth every 8 (eight) hours as needed for muscle spasms. Patient not taking: Reported on 02/27/2022 02/22/22   Joycelyn Das, MD  Multiple Vitamin (MULTIVITAMIN WITH MINERALS) TABS tablet Take 1 tablet by mouth daily. Patient not taking: Reported on 02/27/2022 02/23/22   Joycelyn Das, MD  naproxen (NAPROSYN) 500 MG tablet Take 1 tablet (500 mg total) by mouth 2 (two) times daily as needed (aching, pain, or discomfort). Patient not taking: Reported on 02/27/2022 02/22/22   Joycelyn Das, MD  pantoprazole (PROTONIX) 40 MG tablet Take 1 tablet (40 mg total) by mouth daily. Patient not taking: Reported on 05/10/2022 01/23/22   Karsten Ro, MD  QUEtiapine (SEROQUEL) 100 MG tablet Take 1 tablet (100 mg total) by mouth at bedtime. Patient not taking: Reported on 05/10/2022 02/14/22 04/05/22  Comer Locket, MD  thiamine 100 MG  tablet Take 1 tablet (100 mg total) by mouth daily. Patient not taking: Reported on 02/27/2022 02/23/22   Joycelyn Das, MD    Physical Exam: Constitutional: Moderately built and nourished. Vitals:   05/09/22 2058 05/10/22 0126 05/10/22 0239 05/10/22 0240  BP: 119/65 128/71 109/65   Pulse: (!) 56 93 73   Resp: 18 18 16    Temp: 98.4 F (36.9 C) 98.7 F (37.1 C)    TempSrc: Oral Oral    SpO2: 98% 100% 98%   Weight:    77.1 kg  Height:    5\' 10"  (1.778 m)   Eyes: Anicteric no pallor. ENMT: No discharge from the ears  eyes nose and mouth. Neck: No mass felt.  No neck rigidity. Respiratory: No rhonchi or crepitations. Cardiovascular: S1 and S2 heard. Abdomen: Soft nontender bowel sounds present. Musculoskeletal: Swelling of the left forearm patient able to make a fist.  Some restriction of movement of the left elbow. Skin: Mild erythema of the left forearm. Neurologic: Alert awake oriented time place and person.  Moves all extremities. Psychiatric: Appears normal.  Normal affect.   Labs on Admission: I have personally reviewed following labs and imaging studies  CBC: Recent Labs  Lab 05/09/22 2250  WBC 21.8*  NEUTROABS 18.7*  HGB 10.2*  HCT 29.2*  MCV 74.1*  PLT 328   Basic Metabolic Panel: Recent Labs  Lab 05/09/22 2250  NA 131*  K 3.9  CL 98  CO2 25  GLUCOSE 98  BUN 17  CREATININE 1.02  CALCIUM 8.8*   GFR: Estimated Creatinine Clearance: 103.4 mL/min (by C-G formula based on SCr of 1.02 mg/dL). Liver Function Tests: Recent Labs  Lab 05/09/22 2250  AST 27  ALT 22  ALKPHOS 81  BILITOT 0.5  PROT 7.5  ALBUMIN 3.1*   No results for input(s): "LIPASE", "AMYLASE" in the last 168 hours. No results for input(s): "AMMONIA" in the last 168 hours. Coagulation Profile: No results for input(s): "INR", "PROTIME" in the last 168 hours. Cardiac Enzymes: No results for input(s): "CKTOTAL", "CKMB", "CKMBINDEX", "TROPONINI" in the last 168 hours. BNP (last 3 results) No results for input(s): "PROBNP" in the last 8760 hours. HbA1C: No results for input(s): "HGBA1C" in the last 72 hours. CBG: No results for input(s): "GLUCAP" in the last 168 hours. Lipid Profile: No results for input(s): "CHOL", "HDL", "LDLCALC", "TRIG", "CHOLHDL", "LDLDIRECT" in the last 72 hours. Thyroid Function Tests: No results for input(s): "TSH", "T4TOTAL", "FREET4", "T3FREE", "THYROIDAB" in the last 72 hours. Anemia Panel: No results for input(s): "VITAMINB12", "FOLATE", "FERRITIN", "TIBC", "IRON",  "RETICCTPCT" in the last 72 hours. Urine analysis:    Component Value Date/Time   COLORURINE YELLOW 02/27/2022 0628   APPEARANCEUR HAZY (A) 02/27/2022 0628   LABSPEC 1.012 02/27/2022 0628   PHURINE 5.0 02/27/2022 0628   GLUCOSEU NEGATIVE 02/27/2022 0628   HGBUR NEGATIVE 02/27/2022 0628   BILIRUBINUR NEGATIVE 02/27/2022 0628   KETONESUR 5 (A) 02/27/2022 0628   PROTEINUR NEGATIVE 02/27/2022 0628   UROBILINOGEN 1.0 05/02/2014 0956   NITRITE NEGATIVE 02/27/2022 0628   LEUKOCYTESUR NEGATIVE 02/27/2022 0628   Sepsis Labs: @LABRCNTIP (procalcitonin:4,lacticidven:4) )No results found for this or any previous visit (from the past 240 hour(s)).   Radiological Exams on Admission: DG Forearm Left  Result Date: 05/09/2022 CLINICAL DATA:  Cellulitis.  Evaluate for air forming organism. EXAM: LEFT FOREARM - 2 VIEW COMPARISON:  None Available. FINDINGS: There is diffuse soft tissue swelling of the arm. No evidence for foreign body or focal soft tissue  gas collection. The osseous structures are within normal limits. No fracture or dislocation. IMPRESSION: 1. Diffuse soft tissue swelling. 2. No acute bony abnormality. Electronically Signed   By: Darliss Cheney M.D.   On: 05/09/2022 22:27   DG Humerus Left  Result Date: 05/09/2022 CLINICAL DATA:  Cellulitis.  Evaluate for gas forming organism. EXAM: LEFT HUMERUS - 2+ VIEW COMPARISON:  None Available. FINDINGS: There is no evidence of fracture or other focal bone lesions. There is soft tissue swelling of the arm. No evidence for soft tissue gas or foreign body. IMPRESSION: 1. Soft tissue swelling of the arm. 2. No acute bony abnormality. Electronically Signed   By: Darliss Cheney M.D.   On: 05/09/2022 22:26    EKG: Independently reviewed.  Sinus rhythm.  Assessment/Plan Principal Problem:   Cellulitis of left forearm Active Problems:   Substance use disorder   Cellulitis    Left forearm cellulitis -patient admits to taking IV drugs and injecting on  his left forearm about a week ago.  Blood cultures were drawn and started on empiric antibiotics.  Follow MRI of the left forearm.  Keep arm elevated. IV drug abuse social work consult.  Observe for any withdrawal signs. Anemia appears to be chronic.  Follow CBC. History of bipolar disorder per the chart patient states he does not take any medications.  Since patient has left upper extremity cellulitis with significant leukocytosis with IV drug abuse will need close management and inpatient status.   DVT prophylaxis: SCD's for now in anticipation of procedure if needed. Code Status: Full code. Family Communication: Discussed with patient. Disposition Plan: Home. Consults called: Child psychotherapist. Admission status: Inpatient.   Eduard Clos MD [facility] Pager [phone].  If 7PM-7AM, please contact night-coverage [email or web address] Password [facility]  05/10/2022, 3:58 AM

## 2022-05-10 NOTE — ED Notes (Signed)
Patient transported to MRI 

## 2022-05-10 NOTE — ED Provider Notes (Signed)
[facility] EMERGENCY DEPARTMENT Provider Note   CSN: [id number] Arrival date & time: 05/09/22  2046     History  Chief Complaint  Patient presents with   Abscess   Arm Pain    L    Tristan [name] is a 36 y.o. male.  The history is provided by the patient.  Abscess Location:  Shoulder/arm Shoulder/arm abscess location:  L upper arm and L forearm Abscess quality: induration, painful and redness   Progression:  Worsening Pain details:    Progression:  Worsening Chronicity:  New Context: injected drug use   Relieved by:  Nothing Associated symptoms: fever and nausea   Arm Pain   Patient with history of substance use disorder and IV drug use presents with left arm pain and swelling.  This been ongoing for the past 2 days.  It is worsening.  He reports significant pain with movement of his arm.  He reports fevers and chills.  He reports nausea and vomiting.  He now reports having chest pain.   Past Medical History:  Diagnosis Date   Bipolar 1 disorder (HCC)    Hepatitis C    Hypertension    PTSD (post-traumatic stress disorder)     Home Medications Prior to Admission medications   Medication Sig Start Date End Date Taking? Authorizing Provider  escitalopram (LEXAPRO) 10 MG tablet Take 1 tablet (10 mg total) by mouth daily. Patient not taking: Reported on 05/10/2022 02/14/22 04/05/22  Comer Locket, MD  folic acid (FOLVITE) 1 MG tablet Take 1 tablet (1 mg total) by mouth daily. Patient not taking: Reported on 02/27/2022 02/23/22   Joycelyn Das, MD  gabapentin (NEURONTIN) 100 MG capsule Take 1 capsule (100 mg total) by mouth 3 (three) times daily. Patient not taking: Reported on 05/10/2022 02/14/22 04/05/22  Comer Locket, MD  hydrOXYzine (ATARAX) 25 MG tablet Take 1 tablet (25 mg total) by mouth every 6 (six) hours as needed for anxiety. Patient not taking: Reported on 02/27/2022 02/22/22   Joycelyn Das, MD  loperamide (IMODIUM) 2 MG capsule  Take 1 capsule (2 mg total) by mouth as needed for diarrhea or loose stools (diarrhea). Patient not taking: Reported on 02/27/2022 02/22/22   Joycelyn Das, MD  methocarbamol (ROBAXIN) 500 MG tablet Take 1 tablet (500 mg total) by mouth every 8 (eight) hours as needed for muscle spasms. Patient not taking: Reported on 02/27/2022 02/22/22   Joycelyn Das, MD  Multiple Vitamin (MULTIVITAMIN WITH MINERALS) TABS tablet Take 1 tablet by mouth daily. Patient not taking: Reported on 02/27/2022 02/23/22   Joycelyn Das, MD  naproxen (NAPROSYN) 500 MG tablet Take 1 tablet (500 mg total) by mouth 2 (two) times daily as needed (aching, pain, or discomfort). Patient not taking: Reported on 02/27/2022 02/22/22   Joycelyn Das, MD  pantoprazole (PROTONIX) 40 MG tablet Take 1 tablet (40 mg total) by mouth daily. Patient not taking: Reported on 05/10/2022 01/23/22   Karsten Ro, MD  QUEtiapine (SEROQUEL) 100 MG tablet Take 1 tablet (100 mg total) by mouth at bedtime. Patient not taking: Reported on 05/10/2022 02/14/22 04/05/22  Comer Locket, MD  thiamine 100 MG tablet Take 1 tablet (100 mg total) by mouth daily. Patient not taking: Reported on 02/27/2022 02/23/22   Joycelyn Das, MD      Allergies    Tomato    Review of Systems   Review of Systems  Constitutional:  Positive for fever.  Gastrointestinal:  Positive for nausea.  Skin:  Positive for color change and wound.    Physical Exam Updated Vital Signs BP 109/65 (BP Location: Right Arm)   Pulse 73   Temp 98.7 F (37.1 C) (Oral)   Resp 16   Ht 1.778 m (5\' 10" )   Wt 77.1 kg   SpO2 98%   BMI 24.39 kg/m  Physical Exam CONSTITUTIONAL: Disheveled and anxious HEAD: Normocephalic/atraumatic EYES: EOMI ENMT: Mucous membranes moist NECK: supple no meningeal signs CV: S1/S2 noted, no murmurs/rubs/gallops noted LUNGS: Lungs are clear to auscultation bilaterally, no apparent distress ABDOMEN: soft, nontender NEURO: Pt is awake/alert/appropriate, moves all  extremitiesx4.  No facial droop.   EXTREMITIES: pulses normal/equal, full ROM Left arm is erythematous and firm to palpation but overall soft.  There is no crepitus or drainage.  No significant fluctuance.  Distal pulses are equal and intact.  Patient can flex the left below. Photos below SKIN: warm, see photos PSYCH: Anxious           ED Results / Procedures / Treatments   Labs (all labs ordered are listed, but only abnormal results are displayed) Labs Reviewed  CBC WITH DIFFERENTIAL/PLATELET - Abnormal; Notable for the following components:      Result Value   WBC 21.8 (*)    RBC 3.94 (*)    Hemoglobin 10.2 (*)    HCT 29.2 (*)    MCV 74.1 (*)    MCH 25.9 (*)    Neutro Abs 18.7 (*)    Monocytes Absolute 1.1 (*)    Abs Immature Granulocytes 0.19 (*)    All other components within normal limits  COMPREHENSIVE METABOLIC PANEL - Abnormal; Notable for the following components:   Sodium 131 (*)    Calcium 8.8 (*)    Albumin 3.1 (*)    All other components within normal limits  SEDIMENTATION RATE - Abnormal; Notable for the following components:   Sed Rate 66 (*)    All other components within normal limits  C-REACTIVE PROTEIN - Abnormal; Notable for the following components:   CRP 20.4 (*)    All other components within normal limits  CULTURE, BLOOD (ROUTINE X 2)  CULTURE, BLOOD (ROUTINE X 2)  LACTIC ACID, PLASMA  LACTIC ACID, PLASMA    EKG None  Radiology DG Forearm Left  Result Date: 05/09/2022 CLINICAL DATA:  Cellulitis.  Evaluate for air forming organism. EXAM: LEFT FOREARM - 2 VIEW COMPARISON:  None Available. FINDINGS: There is diffuse soft tissue swelling of the arm. No evidence for foreign body or focal soft tissue gas collection. The osseous structures are within normal limits. No fracture or dislocation. IMPRESSION: 1. Diffuse soft tissue swelling. 2. No acute bony abnormality. Electronically Signed   By: 05/11/2022 M.D.   On: 05/09/2022 22:27   DG  Humerus Left  Result Date: 05/09/2022 CLINICAL DATA:  Cellulitis.  Evaluate for gas forming organism. EXAM: LEFT HUMERUS - 2+ VIEW COMPARISON:  None Available. FINDINGS: There is no evidence of fracture or other focal bone lesions. There is soft tissue swelling of the arm. No evidence for soft tissue gas or foreign body. IMPRESSION: 1. Soft tissue swelling of the arm. 2. No acute bony abnormality. Electronically Signed   By: 05/11/2022 M.D.   On: 05/09/2022 22:26    Procedures Procedures    Medications Ordered in ED Medications  fentaNYL (SUBLIMAZE) injection 100 mcg (0 mcg Intravenous Hold 05/10/22 0246)  ondansetron (ZOFRAN) injection 4 mg (0 mg Intravenous Hold 05/10/22 0247)  vancomycin (VANCOREADY) IVPB 1500  mg/300 mL (has no administration in time range)  vancomycin (VANCOREADY) IVPB 1250 mg/250 mL (has no administration in time range)  piperacillin-tazobactam (ZOSYN) IVPB 3.375 g (3.375 g Intravenous New Bag/Given 05/10/22 0239)    ED Course/ Medical Decision Making/ A&P Clinical Course as of 05/10/22 0325  Fri May 10, 2022  0230 Patient with obvious cellulitis throughout the forearm and up into the humerus.  X-rays not reveal any obvious gas.  There is no crepitus.  There is no significant fluctuance at this time for bedside I&D.  We will start IV antibiotics and plan for admission.  Would also benefit from orthopedic consultation [DW]  [phone] Discussed with Dr. Toniann Fail for admission.  He will be admitted to the medical service for IV antibiotics and may need orthopedic evaluation later, but no immediate indication for operative management [DW]    Clinical Course User Index [DW] Zadie Rhine, MD                           Medical Decision Making Amount and/or Complexity of Data Reviewed ECG/medicine tests: ordered.  Risk Prescription drug management. Decision regarding hospitalization.   This patient presents to the ED for concern of arm pain, this involves an  extensive number of treatment options, and is a complaint that carries with it a high risk of complications and morbidity.  The differential diagnosis includes but is not limited to cellulitis, abscess, necrotizing fasciitis  Comorbidities that complicate the patient evaluation: Patient's presentation is complicated by their history of IV drug use  Social Determinants of Health: Patient's impaired access to primary care and substance use disorder   increases the complexity of managing their presentation  Additional history obtained: Records reviewed  [facility] notes reviewed  Lab Tests: I Ordered, and personally interpreted labs.  The pertinent results include: Leukocytosis  Imaging Studies ordered: I ordered imaging studies including X-ray left forearm and humerus   I independently visualized and interpreted imaging which showed no acute fracture I agree with the radiologist interpretation   Medicines ordered and prescription drug management: I ordered medication antibiotics for cellulitis Reevaluation of the patient after these medicines showed that the patient    stayed the same   Critical Interventions:  IV antibiotics  Consultations Obtained: I requested consultation with the admitting physician [facility] , and discussed  findings as well as pertinent plan - they recommend: Admission  Reevaluation: After the interventions noted above, I reevaluated the patient and found that they have :stayed the same  Complexity of problems addressed: Patient's presentation is most consistent with  acute presentation with potential threat to life or bodily function  Disposition: After consideration of the diagnostic results and the patient's response to treatment,  I feel that the patent would benefit from admission   .           Final Clinical Impression(s) / ED Diagnoses Final diagnoses:  Substance use disorder  Cellulitis of left upper extremity    Rx / DC Orders ED Discharge  Orders     None         Zadie Rhine, MD 05/10/22 [phone]

## 2022-05-10 NOTE — Progress Notes (Signed)
  Patient seen and examined and evaluated after midnight  36 year old male known polysubstance abuse (heroin, methamphetamine, THC) PTSD, bipolar, hep C, continued cigarette smoking Several admissions to [facility] for depression, hallucinations with auditory command as well as SI Several emergency room visits July polysubstance abuse  Presented to [facility] ED 8/17 with swelling on left arm halfway up bicep--- work-up revealed obvious cellulitis-x-rays-no significant fluctuance for bedside I&D  MRI left forearm was ordered and is pending  O/e BP 112/66 (BP Location: Right Arm)   Pulse 70   Temp 98.2 F (36.8 C) (Oral)   Resp 14   Ht 5\' 10"  (1.778 m)   Wt 77.1 kg   SpO2 97%   BMI 24.39 kg/m  Sleepy black male no distress Significant swelling to entire forearm extending up the bottom part of the bicep quite diffuse no fluctuance warm to touch slightly tender Patient able to oppose fingers and squeeze Abdomen soft No lower extremity edema   P Treat cellulitis aggressively continue vancomycin and cefepime LR 125 cc/8 follow blood culture Follow MR forearm Briefly counseled regarding cessation although unclear if he will quit Resume other home meds gabapentin 100 3 times daily hydroxyzine 25 every 6 as needed Lexapro 10 daily Seroquel 100 at bedtime He does not admit to any drinking but will still monitor  No charge  , MD [facility] 7:53 AM

## 2022-05-10 NOTE — ED Notes (Signed)
X2 no results 

## 2022-05-10 NOTE — ED Notes (Signed)
Patient states he went outside and fell asleep.

## 2022-05-10 NOTE — Plan of Care (Signed)
  Problem: Education: Goal: Knowledge of General Education information will improve Description Including pain rating scale, medication(s)/side effects and non-pharmacologic comfort measures Outcome: Progressing   

## 2022-05-10 NOTE — ED Notes (Signed)
ED TO INPATIENT HANDOFF REPORT  ED Nurse Name and Phone #: [phone]  S Name/Age/Gender Tristan [name] 36 y.o. male Room/Bed: H015C/H015C  Code Status   Code Status: Full Code  Home/SNF/Other Home Patient oriented to: self, place, time, and situation Is this baseline? Yes   Triage Complete: Triage complete  Chief Complaint Cellulitis [L03.90]  Triage Note Pt c/o abscess/swelling to L arm onset approx 2 days ago, swelling noted to L forearm, halfway up L bicep, pt states pain is starting to "shoot up" to axillary area. Pt states last drug use was today, unsure if needles are clean.    Allergies Allergies  Allergen Reactions   Tomato Itching and Other (See Comments)    Throat and tongue itches    Level of Care/Admitting Diagnosis ED Disposition     ED Disposition  Admit   Condition  --   Comment  Hospital Area: [facility] [[id number]]  Level of Care: Telemetry Medical [104]  May admit patient to [facility] or [facility] if equivalent level of care is available:: No  Covid Evaluation: Asymptomatic - no recent exposure (last 10 days) testing not required  Diagnosis: Cellulitis [371696]  Admitting Physician: Eduard Clos [phone]  Attending Physician: Eduard Clos [phone]  Certification:: I certify this patient will need inpatient services for at least 2 midnights          B Medical/Surgery History Past Medical History:  Diagnosis Date   Bipolar 1 disorder (HCC)    Hepatitis C    Hypertension    PTSD (post-traumatic stress disorder)    Past Surgical History:  Procedure Laterality Date   NO PAST SURGERIES       A IV Location/Drains/Wounds Patient Lines/Drains/Airways Status     Active Line/Drains/Airways     Name Placement date Placement time Site Days   Peripheral IV 05/10/22 20 G Posterior;Right Forearm 05/10/22  0230  Forearm  less than 1   Wound / Incision (Open or Dehisced) 09/28/19 Non-pressure wound  Knee Left;Lateral one inch diameter, red grandular, no discharge 09/28/19  1609  Knee  955   Wound / Incision (Open or Dehisced) 02/28/22 Other (Comment) Heel Right Open 3 x3 wound stage 02/28/22  0800  Heel  71            Intake/Output Last 24 hours No intake or output data in the 24 hours ending 05/10/22 1420  Labs/Imaging Results for orders placed or performed during the hospital encounter of 05/09/22 (from the past 48 hour(s))  Lactic acid, plasma     Status: None   Collection Time: 05/09/22 10:50 PM  Result Value Ref Range   Lactic Acid, Venous 1.2 0.5 - 1.9 mmol/L    Comment: Performed at [facility], [address] [place], [place] [address]  Blood culture (routine x 2)     Status: None (Preliminary result)   Collection Time: 05/09/22 10:50 PM   Specimen: BLOOD  Result Value Ref Range   Specimen Description BLOOD RIGHT ANTECUBITAL    Special Requests      BOTTLES DRAWN AEROBIC AND ANAEROBIC Blood Culture adequate volume   Culture      NO GROWTH < 12 HOURS Performed at [facility], [address] [place], [place] [address]    Report Status PENDING   Blood culture (routine x 2)     Status: None (Preliminary result)   Collection Time: 05/09/22 10:50 PM   Specimen: BLOOD RIGHT HAND  Result Value Ref Range   Specimen Description BLOOD RIGHT HAND    Special Requests AEROBIC BOTTLE ONLY Blood Culture adequate volume    Culture      NO GROWTH < 12 HOURS Performed at [facility], [address], [place], [place] [address]    Report Status PENDING   CBC with Differential     Status: Abnormal   Collection Time: 05/09/22 10:50 PM  Result Value Ref Range   WBC 21.8 (H) 4.0 - 10.5 K/uL   RBC 3.94 (L) 4.22 - 5.81 MIL/uL   Hemoglobin 10.2 (L) 13.0 - 17.0 g/dL   HCT 23.5 (L) 57.3 - 22.0 %   MCV 74.1 (L) 80.0 - 100.0 fL   MCH 25.9 (L) 26.0 - 34.0 pg   MCHC 34.9 30.0 - 36.0 g/dL   RDW 25.4 27.0 - 62.3 %   Platelets 328 150 - 400 K/uL   nRBC 0.0 0.0 -  0.2 %   Neutrophils Relative % 85 %   Neutro Abs 18.7 (H) 1.7 - 7.7 K/uL   Lymphocytes Relative 6 %   Lymphs Abs 1.4 0.7 - 4.0 K/uL   Monocytes Relative 5 %   Monocytes Absolute 1.1 (H) 0.1 - 1.0 K/uL   Eosinophils Relative 2 %   Eosinophils Absolute 0.3 0.0 - 0.5 K/uL   Basophils Relative 1 %   Basophils Absolute 0.1 0.0 - 0.1 K/uL   Immature Granulocytes 1 %   Abs Immature Granulocytes 0.19 (H) 0.00 - 0.07 K/uL    Comment: Performed at [facility], [address], [place], [place] [address]  Comprehensive metabolic panel     Status: Abnormal   Collection Time: 05/09/22 10:50 PM  Result Value Ref Range   Sodium 131 (L) 135 - 145 mmol/L   Potassium 3.9 3.5 - 5.1 mmol/L   Chloride 98 98 - 111 mmol/L   CO2 25 22 - 32 mmol/L   Glucose, Bld 98 70 - 99 mg/dL    Comment: Glucose reference range applies only to samples taken after fasting for at least 8 hours.   BUN 17 6 - 20 mg/dL   Creatinine, Ser 1.51 0.61 - 1.24 mg/dL   Calcium 8.8 (L) 8.9 - 10.3 mg/dL   Total Protein 7.5 6.5 - 8.1 g/dL   Albumin 3.1 (L) 3.5 - 5.0 g/dL   AST 27 15 - 41 U/L   ALT 22 0 - 44 U/L   Alkaline Phosphatase 81 38 - 126 U/L   Total Bilirubin 0.5 0.3 - 1.2 mg/dL   GFR, Estimated >76 >16 mL/min    Comment: (NOTE) Calculated using the CKD-EPI Creatinine Equation (2021)    Anion gap 8 5 - 15    Comment: Performed at [facility], [address], [place], [place] [address]  Sedimentation rate     Status: Abnormal   Collection Time: 05/09/22 10:50 PM  Result Value Ref Range   Sed Rate 66 (H) 0 - 16 mm/hr    Comment: Performed at [facility], [address] [place], [place] [address]  C-reactive protein     Status: Abnormal   Collection Time: 05/09/22 10:50 PM  Result Value Ref Range   CRP 20.4 (H) <1.0 mg/dL    Comment: Performed at [facility], [address] [place], [place] [address]  Comprehensive metabolic panel     Status: Abnormal   Collection Time: 05/10/22  5:20 AM   Result Value Ref  Range   Sodium 128 (L) 135 - 145 mmol/L   Potassium 4.0 3.5 - 5.1 mmol/L   Chloride 99 98 - 111 mmol/L   CO2 22 22 - 32 mmol/L   Glucose, Bld 99 70 - 99 mg/dL    Comment: Glucose reference range applies only to samples taken after fasting for at least 8 hours.   BUN 14 6 - 20 mg/dL   Creatinine, Ser 1.49 0.61 - 1.24 mg/dL   Calcium 8.0 (L) 8.9 - 10.3 mg/dL   Total Protein 6.1 (L) 6.5 - 8.1 g/dL   Albumin 2.6 (L) 3.5 - 5.0 g/dL   AST 24 15 - 41 U/L   ALT 19 0 - 44 U/L   Alkaline Phosphatase 61 38 - 126 U/L   Total Bilirubin 0.3 0.3 - 1.2 mg/dL   GFR, Estimated >70 >26 mL/min    Comment: (NOTE) Calculated using the CKD-EPI Creatinine Equation (2021)    Anion gap 7 5 - 15    Comment: Performed at [facility], [address] [place], [place] [address]  CBC     Status: Abnormal   Collection Time: 05/10/22  5:20 AM  Result Value Ref Range   WBC 18.8 (H) 4.0 - 10.5 K/uL   RBC 3.68 (L) 4.22 - 5.81 MIL/uL   Hemoglobin 9.4 (L) 13.0 - 17.0 g/dL   HCT 85.0 (L) 27.7 - 41.2 %   MCV 75.5 (L) 80.0 - 100.0 fL   MCH 25.5 (L) 26.0 - 34.0 pg   MCHC 33.8 30.0 - 36.0 g/dL   RDW 87.8 67.6 - 72.0 %   Platelets 290 150 - 400 K/uL   nRBC 0.0 0.0 - 0.2 %    Comment: Performed at [facility], [address], [place], [place] [address]   MR FOREARM LEFT W WO CONTRAST  Result Date: 05/10/2022 CLINICAL DATA:  Soft tissue mass, forearm, deep EXAM: MRI OF THE LEFT FOREARM WITHOUT AND WITH CONTRAST TECHNIQUE: Multiplanar, multisequence MR imaging of the left forearm was performed before and after the administration of intravenous contrast. CONTRAST:  7.11mL GADAVIST GADOBUTROL 1 MMOL/ML IV SOLN COMPARISON:  Forearm radiograph 05/09/2022. FINDINGS: Bones/Joint/Cartilage No significant marrow signal alteration. The cortex is intact. There is partially visualized wrist and elbow arthritis. There is a trace elbow joint effusion. Muscles and Tendons There is intramuscular edema and  enhancement within the medial aspect of the flexor compartment and in the proximal extensor compartment. There is no intramuscular collection. Soft tissues There is extensive skin thickening and subcutaneous soft tissue swelling throughout the forearm. There is a lobular fluid collection coursing halfway circumferentially around the medial forearm from anterior to posterior, circumferentially measuring up to 10.3 cm and up to 1.4 cm in width (axial postcontrast image 20). This measures approximately 13.5 cm from proximal to distal (series 16, image 16). No susceptibility artifact to suggest foreign body or soft tissue gas. IMPRESSION: Extensive cellulitis of the left forearm, with large subcutaneous abscess measuring up to 1.5 cm short axis, circumferentially 10.3 cm around the medial forearm from anterior-posterior, and 13.5 cm in proximal-distal extent. Adjacent reactive or infectious myositis in the medial flexor and extensor compartments. No intramuscular abscess. No signs of soft tissue gas by MRI. No evidence of osteomyelitis. Electronically Signed   By: Caprice Renshaw M.D.   On: 05/10/2022 13:47   DG Forearm Left  Result Date: 05/09/2022 CLINICAL DATA:  Cellulitis.  Evaluate for air forming organism. EXAM: LEFT FOREARM - 2 VIEW  COMPARISON:  None Available. FINDINGS: There is diffuse soft tissue swelling of the arm. No evidence for foreign body or focal soft tissue gas collection. The osseous structures are within normal limits. No fracture or dislocation. IMPRESSION: 1. Diffuse soft tissue swelling. 2. No acute bony abnormality. Electronically Signed   By: Darliss Cheney M.D.   On: 05/09/2022 22:27   DG Humerus Left  Result Date: 05/09/2022 CLINICAL DATA:  Cellulitis.  Evaluate for gas forming organism. EXAM: LEFT HUMERUS - 2+ VIEW COMPARISON:  None Available. FINDINGS: There is no evidence of fracture or other focal bone lesions. There is soft tissue swelling of the arm. No evidence for soft tissue gas  or foreign body. IMPRESSION: 1. Soft tissue swelling of the arm. 2. No acute bony abnormality. Electronically Signed   By: Darliss Cheney M.D.   On: 05/09/2022 22:26    Pending Labs Unresulted Labs (From admission, onward)     Start     Ordered   05/11/22 0500  CBC  Tomorrow morning,   R        05/10/22 0753   05/11/22 0500  Comprehensive metabolic panel  Tomorrow morning,   R        05/10/22 0753   05/11/22 0500  Protime-INR  Tomorrow morning,   R        05/10/22 0753            Vitals/Pain Today's Vitals   05/10/22 0721 05/10/22 0922 05/10/22 0927 05/10/22 1405  BP: 112/66  111/64 139/75  Pulse: 70  68 74  Resp: 14  14 16   Temp:    99.5 F (37.5 C)  TempSrc:      SpO2: 97%  97% 97%  Weight:      Height:      PainSc: Asleep Asleep      Isolation Precautions No active isolations  Medications Medications  acetaminophen (TYLENOL) tablet 650 mg (has no administration in time range)    Or  acetaminophen (TYLENOL) suppository 650 mg (has no administration in time range)  lactated ringers infusion ( Intravenous New Bag/Given 05/10/22 0721)  ceFEPIme (MAXIPIME) 2 g in sodium chloride 0.9 % 100 mL IVPB (has no administration in time range)  vancomycin (VANCOREADY) IVPB 1250 mg/250 mL (has no administration in time range)  hydrOXYzine (ATARAX) tablet 25 mg (has no administration in time range)  naproxen (NAPROSYN) tablet 500 mg (has no administration in time range)  piperacillin-tazobactam (ZOSYN) IVPB 3.375 g (0 g Intravenous Stopped 05/10/22 0315)  vancomycin (VANCOREADY) IVPB 1500 mg/300 mL (0 mg Intravenous Stopped 05/10/22 0615)  ceFEPIme (MAXIPIME) 2 g in sodium chloride 0.9 % 100 mL IVPB (0 g Intravenous Stopped 05/10/22 0721)  gadobutrol (GADAVIST) 1 MMOL/ML injection 7.7 mL (7.7 mLs Intravenous Contrast Given 05/10/22 1312)    Mobility walks Low fall risk   Focused Assessments     R Recommendations: See Admitting Provider Note  Report given to:   Additional  Notes:

## 2022-05-10 NOTE — Progress Notes (Signed)
New Admission Note:   Arrival Method: stretcher  Mental Orientation: Alert and oriented  Telemetry: Box 21 Assessment: Completed Skin: intact left hand swollen  IV: right forearm  Pain: Tubes: none  Safety Measures: Safety Fall Prevention Plan has been given, discussed and signed Admission: Completed [facility] Orientation: Patient has been orientated to the room, unit and staff.  Family: none   Orders have been reviewed and implemented. Will continue to monitor the patient. Call light has been placed within reach and bed alarm has been activated.   Grantland Want RN [facility] Phone: [phone]

## 2022-05-11 ENCOUNTER — Inpatient Hospital Stay (HOSPITAL_COMMUNITY): Payer: PRIVATE HEALTH INSURANCE | Admitting: Registered Nurse

## 2022-05-11 ENCOUNTER — Encounter (HOSPITAL_COMMUNITY)
Admission: EM | Discharge status: Against Medical Advice | Payer: Self-pay | Source: Home / Self Care | Attending: Family Medicine

## 2022-05-11 ENCOUNTER — Other Ambulatory Visit: Payer: Self-pay

## 2022-05-11 ENCOUNTER — Encounter (HOSPITAL_COMMUNITY): Payer: Self-pay | Admitting: Internal Medicine

## 2022-05-11 DIAGNOSIS — I10 Essential (primary) hypertension: Secondary | ICD-10-CM

## 2022-05-11 DIAGNOSIS — F17200 Nicotine dependence, unspecified, uncomplicated: Secondary | ICD-10-CM

## 2022-05-11 DIAGNOSIS — L089 Local infection of the skin and subcutaneous tissue, unspecified: Secondary | ICD-10-CM

## 2022-05-11 DIAGNOSIS — L03114 Cellulitis of left upper limb: Secondary | ICD-10-CM | POA: Diagnosis not present

## 2022-05-11 DIAGNOSIS — L02414 Cutaneous abscess of left upper limb: Secondary | ICD-10-CM | POA: Diagnosis not present

## 2022-05-11 DIAGNOSIS — D649 Anemia, unspecified: Secondary | ICD-10-CM

## 2022-05-11 HISTORY — PX: I & D EXTREMITY: SHX5045

## 2022-05-11 LAB — COMPREHENSIVE METABOLIC PANEL
ALT: 20 U/L (ref 0–44)
AST: 21 U/L (ref 15–41)
Albumin: 2.3 g/dL — ABNORMAL LOW (ref 3.5–5.0)
Alkaline Phosphatase: 62 U/L (ref 38–126)
Anion gap: 11 (ref 5–15)
BUN: 7 mg/dL (ref 6–20)
CO2: 22 mmol/L (ref 22–32)
Calcium: 8.4 mg/dL — ABNORMAL LOW (ref 8.9–10.3)
Chloride: 99 mmol/L (ref 98–111)
Creatinine, Ser: 0.52 mg/dL — ABNORMAL LOW (ref 0.61–1.24)
GFR, Estimated: >60 mL/min (ref >60)
Glucose, Bld: 119 mg/dL — ABNORMAL HIGH (ref 70–99)
Potassium: 3.8 mmol/L (ref 3.5–5.1)
Sodium: 132 mmol/L — ABNORMAL LOW (ref 135–145)
Total Bilirubin: 0.3 mg/dL (ref 0.3–1.2)
Total Protein: 6 g/dL — ABNORMAL LOW (ref 6.5–8.1)

## 2022-05-11 LAB — PROTIME-INR
INR: 1.1 (ref 0.8–1.2)
Prothrombin Time: 13.7 seconds (ref 11.4–15.2)

## 2022-05-11 LAB — MRSA NEXT GEN BY PCR, NASAL: MRSA by PCR Next Gen: NOT DETECTED

## 2022-05-11 LAB — CBC
HCT: 27.9 % — ABNORMAL LOW (ref 39.0–52.0)
Hemoglobin: 9.8 g/dL — ABNORMAL LOW (ref 13.0–17.0)
MCH: 25.5 pg — ABNORMAL LOW (ref 26.0–34.0)
MCHC: 35.1 g/dL (ref 30.0–36.0)
MCV: 72.5 fL — ABNORMAL LOW (ref 80.0–100.0)
Platelets: 301 10*3/uL (ref 150–400)
RBC: 3.85 MIL/uL — ABNORMAL LOW (ref 4.22–5.81)
RDW: 14.1 % (ref 11.5–15.5)
WBC: 16.8 10*3/uL — ABNORMAL HIGH (ref 4.0–10.5)
nRBC: 0 % (ref 0.0–0.2)

## 2022-05-11 SURGERY — IRRIGATION AND DEBRIDEMENT EXTREMITY
Anesthesia: General | Site: Arm Lower | Laterality: Left

## 2022-05-11 MED ORDER — SUCCINYLCHOLINE CHLORIDE 200 MG/10ML IV SOSY
PREFILLED_SYRINGE | INTRAVENOUS | Status: DC | PRN
  Administered 2022-05-11: 140 mg via INTRAVENOUS

## 2022-05-11 MED ORDER — MIDAZOLAM HCL 2 MG/2ML IJ SOLN
INTRAMUSCULAR | Status: AC
  Filled 2022-05-11: qty 2

## 2022-05-11 MED ORDER — CHLORHEXIDINE GLUCONATE 0.12 % MT SOLN
OROMUCOSAL | Status: AC
  Administered 2022-05-11: 15 mL via OROMUCOSAL
  Filled 2022-05-11: qty 15

## 2022-05-11 MED ORDER — OXYCODONE HCL 5 MG/5ML PO SOLN: 5.0000 mg | Freq: Once | ORAL | Status: AC | PRN

## 2022-05-11 MED ORDER — HYDROMORPHONE HCL 1 MG/ML IJ SOLN
INTRAMUSCULAR | Status: AC
  Filled 2022-05-11: qty 1

## 2022-05-11 MED ORDER — OXYCODONE HCL 5 MG PO TABS
10.0000 mg | ORAL_TABLET | Freq: Once | ORAL | Status: AC | PRN
  Administered 2022-05-11: 10 mg via ORAL

## 2022-05-11 MED ORDER — ACETAMINOPHEN 10 MG/ML IV SOLN
INTRAVENOUS | Status: AC
  Filled 2022-05-11: qty 100

## 2022-05-11 MED ORDER — ONDANSETRON HCL 4 MG/2ML IJ SOLN
INTRAMUSCULAR | Status: AC
  Filled 2022-05-11: qty 2

## 2022-05-11 MED ORDER — KETAMINE HCL 10 MG/ML IJ SOLN
INTRAMUSCULAR | Status: DC | PRN
  Administered 2022-05-11: 30 mg via INTRAVENOUS
  Administered 2022-05-11 (×2): 10 mg via INTRAVENOUS

## 2022-05-11 MED ORDER — LIDOCAINE 2% (20 MG/ML) 5 ML SYRINGE
INTRAMUSCULAR | Status: AC
  Filled 2022-05-11: qty 5

## 2022-05-11 MED ORDER — DEXAMETHASONE SODIUM PHOSPHATE 10 MG/ML IJ SOLN
INTRAMUSCULAR | Status: DC | PRN
  Administered 2022-05-11: 5 mg via INTRAVENOUS

## 2022-05-11 MED ORDER — PROPOFOL 10 MG/ML IV BOLUS
INTRAVENOUS | Status: AC
  Filled 2022-05-11: qty 20

## 2022-05-11 MED ORDER — ACETAMINOPHEN 160 MG/5ML PO SOLN: 1000.0000 mg | Freq: Once | ORAL | Status: DC | PRN

## 2022-05-11 MED ORDER — DEXAMETHASONE SODIUM PHOSPHATE 10 MG/ML IJ SOLN
INTRAMUSCULAR | Status: AC
  Filled 2022-05-11: qty 1

## 2022-05-11 MED ORDER — SUGAMMADEX SODIUM 200 MG/2ML IV SOLN
INTRAVENOUS | Status: DC | PRN
  Administered 2022-05-11 (×3): 100 mg via INTRAVENOUS

## 2022-05-11 MED ORDER — 0.9 % SODIUM CHLORIDE (POUR BTL) OPTIME
TOPICAL | Status: DC | PRN
  Administered 2022-05-11: 1000 mL

## 2022-05-11 MED ORDER — FENTANYL CITRATE (PF) 250 MCG/5ML IJ SOLN
INTRAMUSCULAR | Status: DC | PRN
  Administered 2022-05-11 (×2): 50 ug via INTRAVENOUS
  Administered 2022-05-11: 100 ug via INTRAVENOUS
  Administered 2022-05-11: 50 ug via INTRAVENOUS

## 2022-05-11 MED ORDER — LACTATED RINGERS IV SOLN: INTRAVENOUS | Status: DC

## 2022-05-11 MED ORDER — ONDANSETRON HCL 4 MG/2ML IJ SOLN
INTRAMUSCULAR | Status: DC | PRN
  Administered 2022-05-11: 4 mg via INTRAVENOUS

## 2022-05-11 MED ORDER — LIDOCAINE 2% (20 MG/ML) 5 ML SYRINGE
INTRAMUSCULAR | Status: DC | PRN
  Administered 2022-05-11: 80 mg via INTRAVENOUS

## 2022-05-11 MED ORDER — HYDROMORPHONE HCL 1 MG/ML IJ SOLN
0.2500 mg | INTRAMUSCULAR | Status: DC | PRN
  Administered 2022-05-11 (×4): 0.5 mg via INTRAVENOUS

## 2022-05-11 MED ORDER — LACTATED RINGERS IV SOLN
INTRAVENOUS | Status: DC
Start: 2022-05-11 — End: 2022-05-12

## 2022-05-11 MED ORDER — PROPOFOL 10 MG/ML IV BOLUS
INTRAVENOUS | Status: DC | PRN
  Administered 2022-05-11: 200 mg via INTRAVENOUS

## 2022-05-11 MED ORDER — MIDAZOLAM HCL 2 MG/2ML IJ SOLN
INTRAMUSCULAR | Status: DC | PRN
  Administered 2022-05-11: 2 mg via INTRAVENOUS

## 2022-05-11 MED ORDER — FENTANYL CITRATE (PF) 250 MCG/5ML IJ SOLN
INTRAMUSCULAR | Status: AC
  Filled 2022-05-11: qty 5

## 2022-05-11 MED ORDER — OXYCODONE HCL 5 MG PO TABS
ORAL_TABLET | ORAL | Status: AC
  Filled 2022-05-11: qty 2

## 2022-05-11 MED ORDER — ACETAMINOPHEN 500 MG PO TABS: 1000.0000 mg | ORAL_TABLET | Freq: Once | ORAL | Status: DC | PRN

## 2022-05-11 MED ORDER — HYDROMORPHONE HCL 1 MG/ML IJ SOLN
0.5000 mg | INTRAMUSCULAR | Status: DC | PRN
  Administered 2022-05-11 (×2): 0.5 mg via INTRAVENOUS

## 2022-05-11 MED ORDER — ROCURONIUM BROMIDE 10 MG/ML (PF) SYRINGE
PREFILLED_SYRINGE | INTRAVENOUS | Status: DC | PRN
  Administered 2022-05-11: 20 mg via INTRAVENOUS

## 2022-05-11 MED ORDER — ROCURONIUM BROMIDE 10 MG/ML (PF) SYRINGE
PREFILLED_SYRINGE | INTRAVENOUS | Status: AC
  Filled 2022-05-11: qty 10

## 2022-05-11 MED ORDER — ACETAMINOPHEN 10 MG/ML IV SOLN
INTRAVENOUS | Status: DC | PRN
  Administered 2022-05-11: 1000 mg via INTRAVENOUS

## 2022-05-11 MED ORDER — ACETAMINOPHEN 10 MG/ML IV SOLN: 1000.0000 mg | Freq: Once | INTRAVENOUS | Status: DC | PRN

## 2022-05-11 MED ORDER — KETOROLAC TROMETHAMINE 30 MG/ML IJ SOLN
INTRAMUSCULAR | Status: AC
  Filled 2022-05-11: qty 1

## 2022-05-11 MED ORDER — SUCCINYLCHOLINE CHLORIDE 200 MG/10ML IV SOSY
PREFILLED_SYRINGE | INTRAVENOUS | Status: AC
  Filled 2022-05-11: qty 10

## 2022-05-11 MED ORDER — HYDROMORPHONE HCL 1 MG/ML IJ SOLN
INTRAMUSCULAR | Status: AC
  Filled 2022-05-11: qty 2

## 2022-05-11 MED ORDER — CHLORHEXIDINE GLUCONATE 0.12 % MT SOLN: 15.0000 mL | Freq: Once | OROMUCOSAL | Status: AC

## 2022-05-11 MED ORDER — KETOROLAC TROMETHAMINE 30 MG/ML IJ SOLN
INTRAMUSCULAR | Status: DC | PRN
  Administered 2022-05-11: 30 mg via INTRAVENOUS

## 2022-05-11 MED ORDER — HYDROMORPHONE HCL 1 MG/ML IJ SOLN
1.0000 mg | INTRAMUSCULAR | Status: DC | PRN
  Administered 2022-05-11 – 2022-05-13 (×6): 1 mg via INTRAVENOUS
  Filled 2022-05-11 (×6): qty 1

## 2022-05-11 MED ORDER — ORAL CARE MOUTH RINSE: 15.0000 mL | Freq: Once | OROMUCOSAL | Status: AC

## 2022-05-11 MED ORDER — SODIUM CHLORIDE 0.9 % IV SOLN: 2.0000 g | INTRAVENOUS | Status: DC

## 2022-05-11 MED ORDER — KETAMINE HCL 50 MG/5ML IJ SOSY
PREFILLED_SYRINGE | INTRAMUSCULAR | Status: AC
  Filled 2022-05-11: qty 5

## 2022-05-11 MED ORDER — SODIUM CHLORIDE 0.9 % IR SOLN
Status: DC | PRN
  Administered 2022-05-11: 3000 mL

## 2022-05-11 SURGICAL SUPPLY — 61 items
BAG COUNTER SPONGE SURGICOUNT (BAG) ×1 IMPLANT
BNDG CONFORM 2 STRL LF (GAUZE/BANDAGES/DRESSINGS) IMPLANT
BNDG ELASTIC 2X5.8 VLCR STR LF (GAUZE/BANDAGES/DRESSINGS) ×1 IMPLANT
BNDG ELASTIC 3X5.8 VLCR STR LF (GAUZE/BANDAGES/DRESSINGS) ×1 IMPLANT
BNDG ELASTIC 4X5.8 VLCR STR LF (GAUZE/BANDAGES/DRESSINGS) ×1 IMPLANT
BNDG ESMARK 4X9 LF (GAUZE/BANDAGES/DRESSINGS) IMPLANT
BNDG GAUZE ELAST 4 BULKY (GAUZE/BANDAGES/DRESSINGS) ×2 IMPLANT
CHLORAPREP W/TINT 26 (MISCELLANEOUS) ×1 IMPLANT
CORD BIPOLAR FORCEPS 12FT (ELECTRODE) ×1 IMPLANT
COVER BACK TABLE 60X90IN (DRAPES) IMPLANT
COVER MAYO STAND STRL (DRAPES) ×1 IMPLANT
COVER SURGICAL LIGHT HANDLE (MISCELLANEOUS) ×1 IMPLANT
CUFF TOURN SGL QUICK 18X4 (TOURNIQUET CUFF) ×1 IMPLANT
CUFF TOURN SGL QUICK 24 (TOURNIQUET CUFF)
CUFF TRNQT CYL 24X4X16.5-23 (TOURNIQUET CUFF) IMPLANT
DRAIN PENROSE 18X1/4 LTX STRL (DRAIN) IMPLANT
DRAPE HALF SHEET 40X57 (DRAPES) ×1 IMPLANT
DRAPE OEC MINIVIEW 54X84 (DRAPES) IMPLANT
DRAPE SURG 17X23 STRL (DRAPES) ×1 IMPLANT
DRSG ADAPTIC 3X8 NADH LF (GAUZE/BANDAGES/DRESSINGS) ×1 IMPLANT
GAUZE PAD ABD 8X10 STRL (GAUZE/BANDAGES/DRESSINGS) IMPLANT
GAUZE SPONGE 4X4 12PLY STRL (GAUZE/BANDAGES/DRESSINGS) ×1 IMPLANT
GAUZE XEROFORM 1X8 LF (GAUZE/BANDAGES/DRESSINGS) ×1 IMPLANT
GAUZE XEROFORM 5X9 LF (GAUZE/BANDAGES/DRESSINGS) IMPLANT
GLOVE BIO SURGEON STRL SZ7 (GLOVE) ×1 IMPLANT
GLOVE SURG UNDER POLY LF SZ7 (GLOVE) ×1 IMPLANT
GOWN STRL REUS W/ TWL XL LVL3 (GOWN DISPOSABLE) ×2 IMPLANT
GOWN STRL REUS W/TWL XL LVL3 (GOWN DISPOSABLE) ×2
IV CATH 18G X1.75 CATHLON (IV SOLUTION) IMPLANT
KIT BASIN OR (CUSTOM PROCEDURE TRAY) ×1 IMPLANT
KIT TURNOVER KIT B (KITS) ×1 IMPLANT
LOOP VESSEL MAXI BLUE (MISCELLANEOUS) IMPLANT
MANIFOLD NEPTUNE II (INSTRUMENTS) IMPLANT
NDL HYPO 25GX1X1/2 BEV (NEEDLE) IMPLANT
NDL HYPO 25X1 1.5 SAFETY (NEEDLE) ×1 IMPLANT
NDL KEITH (NEEDLE) IMPLANT
NEEDLE HYPO 25GX1X1/2 BEV (NEEDLE) IMPLANT
NEEDLE HYPO 25X1 1.5 SAFETY (NEEDLE) ×1 IMPLANT
NEEDLE KEITH (NEEDLE) IMPLANT
NS IRRIG 1000ML POUR BTL (IV SOLUTION) ×1 IMPLANT
PACK ORTHO EXTREMITY (CUSTOM PROCEDURE TRAY) ×1 IMPLANT
PAD ABD 8X10 STRL (GAUZE/BANDAGES/DRESSINGS) ×1 IMPLANT
PAD ARMBOARD 7.5X6 YLW CONV (MISCELLANEOUS) ×1 IMPLANT
PAD CAST 4YDX4 CTTN HI CHSV (CAST SUPPLIES) ×2 IMPLANT
PADDING CAST ABS 3INX4YD NS (CAST SUPPLIES) ×1
PADDING CAST ABS COTTON 3X4 (CAST SUPPLIES) ×1 IMPLANT
PADDING CAST COTTON 4X4 STRL (CAST SUPPLIES) ×2
SPLINT PLASTER CAST XFAST 3X15 (CAST SUPPLIES) ×1 IMPLANT
SPLINT PLASTER XTRA FASTSET 3X (CAST SUPPLIES) ×1
SPONGE T-LAP 4X18 ~~LOC~~+RFID (SPONGE) ×1 IMPLANT
SUT FIBERWIRE 3-0 18 TAPR NDL (SUTURE)
SUTURE FIBERWR 3-0 18 TAPR NDL (SUTURE) IMPLANT
SWAB CULTURE ESWAB REG 1ML (MISCELLANEOUS) IMPLANT
SYR BULB EAR ULCER 3OZ GRN STR (SYRINGE) ×1 IMPLANT
SYR CONTROL 10ML LL (SYRINGE) IMPLANT
TOWEL GREEN STERILE (TOWEL DISPOSABLE) ×1 IMPLANT
TOWEL GREEN STERILE FF (TOWEL DISPOSABLE) ×2 IMPLANT
TUBE CONNECTING 12X1/4 (SUCTIONS) IMPLANT
UNDERPAD 30X36 HEAVY ABSORB (UNDERPADS AND DIAPERS) ×1 IMPLANT
WATER STERILE IRR 1000ML POUR (IV SOLUTION) ×1 IMPLANT
YANKAUER SUCT BULB TIP NO VENT (SUCTIONS) IMPLANT

## 2022-05-11 NOTE — Interval H&P Note (Signed)
History and Physical Interval Note:  05/11/2022 5:26 PM  Tristan [name]  has presented today for surgery, with the diagnosis of Left Forearm infection.  The various methods of treatment have been discussed with the patient and family. After consideration of risks, benefits and other options for treatment, the patient has consented to  Procedure(s): IRRIGATION AND DEBRIDEMENT FOREARM (Left) as a surgical intervention.  The patient's history has been reviewed, patient examined, no change in status, stable for surgery.  I have reviewed the patient's chart and labs.  Questions were answered to the patient's satisfaction.      [name]

## 2022-05-11 NOTE — Anesthesia Preprocedure Evaluation (Signed)
Anesthesia Evaluation  Patient identified by MRN, date of birth, ID band Patient awake    Reviewed: Allergy & Precautions, NPO status , Patient's Chart, lab work & pertinent test results  History of Anesthesia Complications Negative for: history of anesthetic complications  Airway Mallampati: III  TM Distance: >3 FB Neck ROM: Full    Dental  (+) Teeth Intact, Dental Advisory Given   Pulmonary neg shortness of breath, neg sleep apnea, neg COPD, neg recent URI, Current Smoker and Patient abstained from smoking.,    breath sounds clear to auscultation       Cardiovascular hypertension,  Rhythm:Regular     Neuro/Psych PSYCHIATRIC DISORDERS Anxiety Depression Bipolar Disorder negative neurological ROS     GI/Hepatic negative GI ROS, (+)     substance abuse  marijuana use and methamphetamine use, Hepatitis -, C  Endo/Other  negative endocrine ROS  Renal/GU Renal diseaseLab Results      Component                Value               Date                      CREATININE               0.52 (L)            05/11/2022           Lab Results      Component                Value               Date                      K                        3.8                 05/11/2022                Musculoskeletal  (+) narcotic dependent  Abdominal   Peds  Hematology  (+) Blood dyscrasia, anemia , Lab Results      Component                Value               Date                      WBC                      16.8 (H)            05/11/2022                HGB                      9.8 (L)             05/11/2022                HCT                      27.9 (L)            05/11/2022  MCV                      72.5 (L)            05/11/2022                PLT                      301                 05/11/2022              Anesthesia Other Findings   Reproductive/Obstetrics                              Anesthesia Physical Anesthesia Plan  ASA: 3 and emergent  Anesthesia Plan: General   Post-op Pain Management: Toradol IV (intra-op)*, Ofirmev IV (intra-op)*, Ketamine IV* and Precedex   Induction: Intravenous  PONV Risk Score and Plan: 1 and Ondansetron and Dexamethasone  Airway Management Planned: LMA and Oral ETT  Additional Equipment: None  Intra-op Plan:   Post-operative Plan: Extubation in OR  Informed Consent: I have reviewed the patients History and Physical, chart, labs and discussed the procedure including the risks, benefits and alternatives for the proposed anesthesia with the patient or authorized representative who has indicated his/her understanding and acceptance.     Dental advisory given  Plan Discussed with: CRNA  Anesthesia Plan Comments:         Anesthesia Quick Evaluation

## 2022-05-11 NOTE — Progress Notes (Signed)
PROGRESS NOTE   [facility]  UYQ:[id number] DOB: [birth year] DOA: 05/09/2022 PCP: Oneita Hurt, No  Brief Narrative:  36 year old male known polysubstance abuse (heroin, methamphetamine, THC) PTSD, bipolar, hep C, continued cigarette smoking Several admissions to [facility] for depression, hallucinations with auditory command as well as SI Several emergency room visits July polysubstance abuse   Presented to [facility] ED 8/17 with swelling on left arm halfway up bicep--- work-up revealed obvious cellulitis-x-rays-no significant fluctuance for bedside I&D   MRI left forearm was ordered and is pending  Hospital-Problem based course  Large abscess left forearm Continued IV DU Patient had MRI showing 1.5 X10.3 X 13.5 cm reactive myositis and abscess White count shows some improvement-- I have spoken with Dr. Frazier Butt of hand surgery who will see the patient and  he has been kept n.p.o. and attended their input He may require surgery and have explained this to patient We have narrowed his antibiotics from cefepime based on pharmacy recommendation to vancomycin and ceftriaxone Pain control with Naprosyn 500 twice daily as well as Suboxone 1 tablet daily We will continue fluids but cut back the rate to LR at 50 cc/h Bipolar PTSD Continue hydroxyzine 25 every 6 as needed anxiety Hep C Will need outpatient surveillance Patient should stop  DVT prophylaxis: SCD Code Status: Full Family Communication: None present Disposition:  Status is: Inpatient Remains inpatient appropriate because:   Probably require spine surgery eval and surgery the left   Consultants:   Hand surgeon Dr. Frazier Butt consulted  Procedures:   Antimicrobials: As above   Subjective: Still with swelling and pain not much improvement since yesterday-additionally low-grade temp MRI results reviewed and discussed with hand surgeon   Objective: Vitals:   05/10/22 1537 05/10/22 2006 05/11/22 0515 05/11/22 0843  BP: 101/75  (!) 150/77 118/79 130/75  Pulse: 73 73 (!) 57 70  Resp: 18 20  18   Temp: 99.7 F (37.6 C) 98.6 F (37 C) 98.5 F (36.9 C) 98.4 F (36.9 C)  TempSrc:  Oral    SpO2: 100% 98% 100% 99%  Weight:      Height:        Intake/Output Summary (Last 24 hours) at 05/11/2022 1030 Last data filed at 05/11/2022 0037 Gross per 24 hour  Intake 2141.02 ml  Output 500 ml  Net 1641.02 ml   Filed Weights   05/10/22 0240 05/10/22 1533  Weight: 77.1 kg 81.2 kg    Examination:  Awake coherent no distress EOMI NCAT Large swelling of the left forearm Track marks noted Abdomen soft S1-S2 no murmur Neuro grossly intact  Data Reviewed: personally reviewed   CBC    Component Value Date/Time   WBC 16.8 (H) 05/11/2022 0256   RBC 3.85 (L) 05/11/2022 0256   HGB 9.8 (L) 05/11/2022 0256   HCT 27.9 (L) 05/11/2022 0256   PLT 301 05/11/2022 0256   MCV 72.5 (L) 05/11/2022 0256   MCH 25.5 (L) 05/11/2022 0256   MCHC 35.1 05/11/2022 0256   RDW 14.1 05/11/2022 0256   LYMPHSABS 1.4 05/09/2022 2250   MONOABS 1.1 (H) 05/09/2022 2250   EOSABS 0.3 05/09/2022 2250   BASOSABS 0.1 05/09/2022 2250      Latest Ref Rng & Units 05/11/2022    2:56 AM 05/10/2022    5:20 AM 05/09/2022   10:50 PM  CMP  Glucose 70 - 99 mg/dL 05/11/2022  99  98   BUN 6 - 20 mg/dL 7  14  17    Creatinine 0.61 - 1.24 mg/dL  0.52  0.86  1.02   Sodium 135 - 145 mmol/L 132  128  131   Potassium 3.5 - 5.1 mmol/L 3.8  4.0  3.9   Chloride 98 - 111 mmol/L 99  99  98   CO2 22 - 32 mmol/L 22  22  25    Calcium 8.9 - 10.3 mg/dL 8.4  8.0  8.8   Total Protein 6.5 - 8.1 g/dL 6.0  6.1  7.5   Total Bilirubin 0.3 - 1.2 mg/dL 0.3  0.3  0.5   Alkaline Phos 38 - 126 U/L 62  61  81   AST 15 - 41 U/L 21  24  27    ALT 0 - 44 U/L 20  19  22       Radiology Studies: MR FOREARM LEFT W WO CONTRAST  Result Date: 05/10/2022 CLINICAL DATA:  Soft tissue mass, forearm, deep EXAM: MRI OF THE LEFT FOREARM WITHOUT AND WITH CONTRAST TECHNIQUE: Multiplanar,  multisequence MR imaging of the left forearm was performed before and after the administration of intravenous contrast. CONTRAST:  7.42mL GADAVIST GADOBUTROL 1 MMOL/ML IV SOLN COMPARISON:  Forearm radiograph 05/09/2022. FINDINGS: Bones/Joint/Cartilage No significant marrow signal alteration. The cortex is intact. There is partially visualized wrist and elbow arthritis. There is a trace elbow joint effusion. Muscles and Tendons There is intramuscular edema and enhancement within the medial aspect of the flexor compartment and in the proximal extensor compartment. There is no intramuscular collection. Soft tissues There is extensive skin thickening and subcutaneous soft tissue swelling throughout the forearm. There is a lobular fluid collection coursing halfway circumferentially around the medial forearm from anterior to posterior, circumferentially measuring up to 10.3 cm and up to 1.4 cm in width (axial postcontrast image 20). This measures approximately 13.5 cm from proximal to distal (series 16, image 16). No susceptibility artifact to suggest foreign body or soft tissue gas. IMPRESSION: Extensive cellulitis of the left forearm, with large subcutaneous abscess measuring up to 1.5 cm short axis, circumferentially 10.3 cm around the medial forearm from anterior-posterior, and 13.5 cm in proximal-distal extent. Adjacent reactive or infectious myositis in the medial flexor and extensor compartments. No intramuscular abscess. No signs of soft tissue gas by MRI. No evidence of osteomyelitis. Electronically Signed   By: 05/12/2022 M.D.   On: 05/10/2022 13:47   DG Forearm Left  Result Date: 05/09/2022 CLINICAL DATA:  Cellulitis.  Evaluate for air forming organism. EXAM: LEFT FOREARM - 2 VIEW COMPARISON:  None Available. FINDINGS: There is diffuse soft tissue swelling of the arm. No evidence for foreign body or focal soft tissue gas collection. The osseous structures are within normal limits. No fracture or  dislocation. IMPRESSION: 1. Diffuse soft tissue swelling. 2. No acute bony abnormality. Electronically Signed   By: Caprice Renshaw M.D.   On: 05/09/2022 22:27   DG Humerus Left  Result Date: 05/09/2022 CLINICAL DATA:  Cellulitis.  Evaluate for gas forming organism. EXAM: LEFT HUMERUS - 2+ VIEW COMPARISON:  None Available. FINDINGS: There is no evidence of fracture or other focal bone lesions. There is soft tissue swelling of the arm. No evidence for soft tissue gas or foreign body. IMPRESSION: 1. Soft tissue swelling of the arm. 2. No acute bony abnormality. Electronically Signed   By: Darliss Cheney M.D.   On: 05/09/2022 22:26     Scheduled Meds:  buprenorphine-naloxone  1 tablet Sublingual Daily   Continuous Infusions:  ceFEPime (MAXIPIME) IV 2 g (05/11/22 Darliss Cheney)   vancomycin 1,250 mg (  05/11/22 0355)     LOS: 1 day   Time spent: [place], MD [facility] To contact the attending provider between 7A-7P or the covering provider during after hours 7P-7A, please log into the web site [email or web address] and access using universal [place] password for that web site. If you do not have the password, please call the hospital operator.  05/11/2022, 10:30 AM

## 2022-05-11 NOTE — Consult Note (Signed)
 HAND SURGERY CONSULTATION  REQUESTING PHYSICIAN: Samtani, Jai-Gurmukh, MD    Chief Complaint: Left arm pain  HPI: Tristan [name] is a 36 y.o. male with a history of polysubstance abuse, bipolar disorder, HCV who presents with cellulitis of the left forearm with a subcutaneous fluid collection noted on overnight MRI.  Patient describes left forearm pain for the last week or so after injecting drugs into his forearm.  He was admitted overnight for forearm pain.  He notes that his pain has improved since starting antibiotics.  He does not seem to have any pain in the wrist or hand.    Past Medical History:  Diagnosis Date   Bipolar 1 disorder (HCC)    Hepatitis C    Hypertension    PTSD (post-traumatic stress disorder)    Past Surgical History:  Procedure Laterality Date   NO PAST SURGERIES     Social History   Socioeconomic History   Marital status: Single    Spouse name: Not on file   Number of children: Not on file   Years of education: Not on file   Highest education level: Not on file  Occupational History   Not on file  Tobacco Use   Smoking status: Every Day    Packs/day: 0.50    Types: Cigarettes   Smokeless tobacco: Never  Vaping Use   Vaping Use: Never used  Substance and Sexual Activity   Alcohol use: Yes    Comment: Pint per day   Drug use: Yes    Types: Marijuana, Heroin, Cocaine, Methamphetamines, Morphine    Comment: denies drug use   Sexual activity: Not Currently  Other Topics Concern   Not on file  Social History Narrative   ** Merged History Encounter **       ** Merged History Encounter **       Social Determinants of Health   Financial Resource Strain: Not on file  Food Insecurity: Not on file  Transportation Needs: Not on file  Physical Activity: Not on file  Stress: Not on file  Social Connections: Not on file   Family History  Problem Relation Age of Onset   Other Father    Psychiatric Illness Father    -  negative except otherwise stated in the family history section Allergies  Allergen Reactions   Tomato Itching and Other (See Comments)    Throat and tongue itches   Prior to Admission medications   Medication Sig Start Date End Date Taking? Authorizing Provider  escitalopram (LEXAPRO) 10 MG tablet Take 1 tablet (10 mg total) by mouth daily. Patient not taking: Reported on 05/10/2022 02/14/22 04/05/22  Singleton, Amy E, MD  folic acid (FOLVITE) 1 MG tablet Take 1 tablet (1 mg total) by mouth daily. Patient not taking: Reported on 02/27/2022 02/23/22   Pokhrel, Laxman, MD  gabapentin (NEURONTIN) 100 MG capsule Take 1 capsule (100 mg total) by mouth 3 (three) times daily. Patient not taking: Reported on 05/10/2022 02/14/22 04/05/22  Singleton, Amy E, MD  hydrOXYzine (ATARAX) 25 MG tablet Take 1 tablet (25 mg total) by mouth every 6 (six) hours as needed for anxiety. Patient not taking: Reported on 02/27/2022 02/22/22   Pokhrel, Laxman, MD  loperamide (IMODIUM) 2 MG capsule Take 1 capsule (2 mg total) by mouth as needed for diarrhea or loose stools (diarrhea). Patient not taking: Reported on 02/27/2022 02/22/22   Pokhrel, Laxman, MD  methocarbamol (ROBAXIN) 500 MG tablet Take 1 tablet (500 mg total) by mouth   every 8 (eight) hours as needed for muscle spasms. Patient not taking: Reported on 02/27/2022 02/22/22   Joycelyn Das, MD  Multiple Vitamin (MULTIVITAMIN WITH MINERALS) TABS tablet Take 1 tablet by mouth daily. Patient not taking: Reported on 02/27/2022 02/23/22   Joycelyn Das, MD  naproxen (NAPROSYN) 500 MG tablet Take 1 tablet (500 mg total) by mouth 2 (two) times daily as needed (aching, pain, or discomfort). Patient not taking: Reported on 02/27/2022 02/22/22   Joycelyn Das, MD  pantoprazole (PROTONIX) 40 MG tablet Take 1 tablet (40 mg total) by mouth daily. Patient not taking: Reported on 05/10/2022 01/23/22   Karsten Ro, MD  QUEtiapine (SEROQUEL) 100 MG tablet Take 1 tablet (100 mg total) by mouth at  bedtime. Patient not taking: Reported on 05/10/2022 02/14/22 04/05/22  Comer Locket, MD  thiamine 100 MG tablet Take 1 tablet (100 mg total) by mouth daily. Patient not taking: Reported on 02/27/2022 02/23/22   Joycelyn Das, MD   MR FOREARM LEFT W WO CONTRAST  Result Date: 05/10/2022 CLINICAL DATA:  Soft tissue mass, forearm, deep EXAM: MRI OF THE LEFT FOREARM WITHOUT AND WITH CONTRAST TECHNIQUE: Multiplanar, multisequence MR imaging of the left forearm was performed before and after the administration of intravenous contrast. CONTRAST:  7.70mL GADAVIST GADOBUTROL 1 MMOL/ML IV SOLN COMPARISON:  Forearm radiograph 05/09/2022. FINDINGS: Bones/Joint/Cartilage No significant marrow signal alteration. The cortex is intact. There is partially visualized wrist and elbow arthritis. There is a trace elbow joint effusion. Muscles and Tendons There is intramuscular edema and enhancement within the medial aspect of the flexor compartment and in the proximal extensor compartment. There is no intramuscular collection. Soft tissues There is extensive skin thickening and subcutaneous soft tissue swelling throughout the forearm. There is a lobular fluid collection coursing halfway circumferentially around the medial forearm from anterior to posterior, circumferentially measuring up to 10.3 cm and up to 1.4 cm in width (axial postcontrast image 20). This measures approximately 13.5 cm from proximal to distal (series 16, image 16). No susceptibility artifact to suggest foreign body or soft tissue gas. IMPRESSION: Extensive cellulitis of the left forearm, with large subcutaneous abscess measuring up to 1.5 cm short axis, circumferentially 10.3 cm around the medial forearm from anterior-posterior, and 13.5 cm in proximal-distal extent. Adjacent reactive or infectious myositis in the medial flexor and extensor compartments. No intramuscular abscess. No signs of soft tissue gas by MRI. No evidence of osteomyelitis. Electronically  Signed   By: Caprice Renshaw M.D.   On: 05/10/2022 13:47   DG Forearm Left  Result Date: 05/09/2022 CLINICAL DATA:  Cellulitis.  Evaluate for air forming organism. EXAM: LEFT FOREARM - 2 VIEW COMPARISON:  None Available. FINDINGS: There is diffuse soft tissue swelling of the arm. No evidence for foreign body or focal soft tissue gas collection. The osseous structures are within normal limits. No fracture or dislocation. IMPRESSION: 1. Diffuse soft tissue swelling. 2. No acute bony abnormality. Electronically Signed   By: Darliss Cheney M.D.   On: 05/09/2022 22:27   DG Humerus Left  Result Date: 05/09/2022 CLINICAL DATA:  Cellulitis.  Evaluate for gas forming organism. EXAM: LEFT HUMERUS - 2+ VIEW COMPARISON:  None Available. FINDINGS: There is no evidence of fracture or other focal bone lesions. There is soft tissue swelling of the arm. No evidence for soft tissue gas or foreign body. IMPRESSION: 1. Soft tissue swelling of the arm. 2. No acute bony abnormality. Electronically Signed   By: Darliss Cheney M.D.   On:  05/09/2022 22:26   - pertinent xrays, CT, MRI studies were reviewed and independently interpreted  Positive ROS: All other systems have been reviewed and were otherwise negative with the exception of those mentioned in the HPI and as above.  Physical Exam: General: No acute distress, resting comfortably Cardiovascular: No pedal edema Respiratory: No cyanosis, no use of accessory musculature Skin: No lesions in the area of chief complaint Neurologic: Sensation intact distally Psychiatric: Patient is at baseline mood and affect  Left Upper Extremity Diffuse forearm swelling with mild erythema No open wounds or sores Questionable palpable fluctuance at ulnar aspect of proximal 1/3 forearm Full ROM of wrist and fingers SILT throughout hand Fingers warm and well perfused w/ BCR    Assessment: 36 yo M w/ polysubstance abuse, BP disorder, HCV with recent IV drug use admitted with left  forearm cellulitis with likely subcutaneous abscess at ulnar aspect of forearm identified on overnight MRI.  Plan: OR today for I&D of left forearm Reviewed risks of surgery with patient including bleeding, continued or worsening infection, damage to neurovascular structures, need for additional surgery Will take intraoperative cultures Will start dressing changes tomorrow Remainder per hospitalist service, appreciate their help in managing this patient  Thank you for the consult and the opportunity to see Tristan [name], M.D. [facility] 11:50 AM

## 2022-05-11 NOTE — Progress Notes (Signed)
Pharmacy Antibiotic Note  Tristan [name] is a 36 y.o. male admitted on 05/09/2022 with L arm cellulitis.  Pharmacy has been consulted for Vancomycin.  MRI>>cellulitis with abscess. Plan for I&D today. No hx of resistant organism. D/w Dr. Mahala Menghini and we will change his cefepime to ceftriaxone.   Plan: Cefepime>Ceftriaxone 2g IV q24 Vancomycin 1500mg  IV now then 1250 mg IV Q 12 hrs. Goal AUC 400-550. Expected AUC: 500 SCr used: 1.02 Vanc levels prn   Height: 5\' 10"  (177.8 cm) Weight: 81.2 kg (179 lb 0.2 oz) IBW/kg (Calculated) : 73  Temp (24hrs), Avg:98.9 F (37.2 C), Min:98.4 F (36.9 C), Max:99.7 F (37.6 C)  Recent Labs  Lab 05/09/22 2250 05/10/22 0520 05/11/22 0256  WBC 21.8* 18.8* 16.8*  CREATININE 1.02 0.86 0.52*  LATICACIDVEN 1.2  --   --      Estimated Creatinine Clearance: 131.8 mL/min (A) (by C-G formula based on SCr of 0.52 mg/dL (L)).    Allergies  Allergen Reactions   Tomato Itching and Other (See Comments)    Throat and tongue itches    Antimicrobials this admission: 8/18 Zosyn x 1 8/18  Vanc >>  8/18 Cefepime >>8/19 8/19 ceftriaxone>>  Microbiology results: 8/17 BCx: ngtd  9/19, PharmD, BCIDP, AAHIVP, CPP Infectious Disease Pharmacist 05/11/2022 10:42 AM

## 2022-05-11 NOTE — Op Note (Signed)
   Date of Surgery: 05/11/2022  INDICATIONS: Patient is a 36 y.o.-year-old male with a left forearm abscess following IV drug injection earlier this week.  Risks, benefits, and alternatives to surgery were again discussed with the patient in the preoperative area. The patient wishes to proceed with surgery.  Informed consent was signed after our discussion.   PREOPERATIVE DIAGNOSIS:  Left forearm abscess  POSTOPERATIVE DIAGNOSIS: Same.  PROCEDURE:  Irrigation and debridement of left forearm abscess   SURGEON: Audria Nine, M.D.  ASSIST:   ANESTHESIA:  general  IV FLUIDS AND URINE: See anesthesia.  ESTIMATED BLOOD LOSS: <10 mL.  IMPLANTS: * No implants in log *   DRAINS: Penrose x 2  COMPLICATIONS: None  DESCRIPTION OF PROCEDURE: The patient was met in the preoperative holding area where the surgical site was marked and the consent form was verified.  The patient was then taken to the operating room and transferred to the operating table.  All bony prominences were well padded.  A tourniquet was applied to the left upper arm.  General endotracheal anesthesia was induced.  The operative extremity was prepped and draped in the usual and sterile fashion.  A formal time-out was performed to confirm that this was the correct patient, surgery, side, and site.   Following timeout, the limb was exsanguinated by gravity and the tourniquet inflated to 250 mmHg.   A longitudinal incision was made at the ulnar aspect of the forearm several centimeters distal from the elbow.  The skin was incised.  Blunt dissection with a tenotomy scissor was used to identify the fluid collection.  Upon entering the abscess, there was copious purulent material encountered.  Aerobic and anaerobic culture swabs were taken.  Blunt dissection with my finger was used to break up potential loculations both proximally and distally.  Upon probing, all loculations were broken up and there was no further extension of the  abscess proximal or distal.  The wound and dead space were then thoroughly irrigated with copious sterile saline using low flow cystoscopy tubing.  Following thorough debridement, the tourniquet was let down.  Hemostasis was achieved with bipolar cautery and direct pressure of the wound.  Hand was warm, pink, and well-perfused.  Two Penrose drains were placed both proximally and distally.  The wound was then closed using a 3-0 Monocryl suture in a simple fashion.  The patient was then extubated uneventfully and transferred to the postoperative bed.  All counts were correct at the end of the procedure.  The patient was then taken to the PACU in stable condition.   POSTOPERATIVE PLAN: Patient will remain admitted to the hospitalist service.  I will take down the dressing for a wound check tomorrow.  Antibiotic choice pending intraoperative culture results.   Audria Nine, MD 6:34 PM

## 2022-05-11 NOTE — Consult Note (Signed)
Spoke with hospitalist regarding patient.  Reviewed his MRI which shows a subcutaneous abscess in ulnar aspect of proximal forearm.  Will post for formal I&D today.  Unfortunately, patient was given breakfast this AM so surgery will be delayed until late afternoon/evening.  Please keep NPO.  Full consult note to follow.

## 2022-05-11 NOTE — Transfer of Care (Signed)
Immediate Anesthesia Transfer of Care Note  Patient: Tristan [name]  Procedure(s) Performed: IRRIGATION AND DEBRIDEMENT FOREARM (Left: Arm Lower)  Patient Location: PACU  Anesthesia Type:General  Level of Consciousness: awake, alert  and oriented  Airway & Oxygen Therapy: Patient Spontanous Breathing and Patient connected to face mask oxygen  Post-op Assessment: Report given to RN and Post -op Vital signs reviewed and stable  Post vital signs: Reviewed and stable  Last Vitals:  Vitals Value Taken Time  BP 136/104 05/11/22 1836  Temp    Pulse    Resp 18 05/11/22 1839  SpO2    Vitals shown include unvalidated device data.  Last Pain:  Vitals:   05/11/22 1714  TempSrc: Oral  PainSc:          Complications: No notable events documented.

## 2022-05-11 NOTE — H&P (View-Only) (Signed)
HAND SURGERY CONSULTATION  REQUESTING PHYSICIAN: Nita Sells, MD    Chief Complaint: Left arm pain  HPI: Tristan [name] is a 36 y.o. male with a history of polysubstance abuse, bipolar disorder, HCV who presents with cellulitis of the left forearm with a subcutaneous fluid collection noted on overnight MRI.  Patient describes left forearm pain for the last week or so after injecting drugs into his forearm.  He was admitted overnight for forearm pain.  He notes that his pain has improved since starting antibiotics.  He does not seem to have any pain in the wrist or hand.    Past Medical History:  Diagnosis Date   Bipolar 1 disorder ([place])    Hepatitis C    Hypertension    PTSD (post-traumatic stress disorder)    Past Surgical History:  Procedure Laterality Date   NO PAST SURGERIES     Social History   Socioeconomic History   Marital status: Single    Spouse name: Not on file   Number of children: Not on file   Years of education: Not on file   Highest education level: Not on file  Occupational History   Not on file  Tobacco Use   Smoking status: Every Day    Packs/day: 0.50    Types: Cigarettes   Smokeless tobacco: Never  Vaping Use   Vaping Use: Never used  Substance and Sexual Activity   Alcohol use: Yes    Comment: Pint per day   Drug use: Yes    Types: Marijuana, Heroin, Cocaine, Methamphetamines, Morphine    Comment: denies drug use   Sexual activity: Not Currently  Other Topics Concern   Not on file  Social History Narrative   ** Merged History Encounter **       ** Merged History Encounter **       Social Determinants of Health   Financial Resource Strain: Not on file  Food Insecurity: Not on file  Transportation Needs: Not on file  Physical Activity: Not on file  Stress: Not on file  Social Connections: Not on file   Family History  Problem Relation Age of Onset   Other Father    Psychiatric Illness Father    -  negative except otherwise stated in the family history section Allergies  Allergen Reactions   Tomato Itching and Other (See Comments)    Throat and tongue itches   Prior to Admission medications   Medication Sig Start Date End Date Taking? Authorizing Provider  escitalopram (LEXAPRO) 10 MG tablet Take 1 tablet (10 mg total) by mouth daily. Patient not taking: Reported on 05/10/2022 02/14/22 04/05/22  Harlow Asa, MD  folic acid (FOLVITE) 1 MG tablet Take 1 tablet (1 mg total) by mouth daily. Patient not taking: Reported on 02/27/2022 02/23/22   Flora Lipps, MD  gabapentin (NEURONTIN) 100 MG capsule Take 1 capsule (100 mg total) by mouth 3 (three) times daily. Patient not taking: Reported on 05/10/2022 02/14/22 04/05/22  Harlow Asa, MD  hydrOXYzine (ATARAX) 25 MG tablet Take 1 tablet (25 mg total) by mouth every 6 (six) hours as needed for anxiety. Patient not taking: Reported on 02/27/2022 02/22/22   Flora Lipps, MD  loperamide (IMODIUM) 2 MG capsule Take 1 capsule (2 mg total) by mouth as needed for diarrhea or loose stools (diarrhea). Patient not taking: Reported on 02/27/2022 02/22/22   Flora Lipps, MD  methocarbamol (ROBAXIN) 500 MG tablet Take 1 tablet (500 mg total) by mouth  every 8 (eight) hours as needed for muscle spasms. Patient not taking: Reported on 02/27/2022 02/22/22   Joycelyn Das, MD  Multiple Vitamin (MULTIVITAMIN WITH MINERALS) TABS tablet Take 1 tablet by mouth daily. Patient not taking: Reported on 02/27/2022 02/23/22   Joycelyn Das, MD  naproxen (NAPROSYN) 500 MG tablet Take 1 tablet (500 mg total) by mouth 2 (two) times daily as needed (aching, pain, or discomfort). Patient not taking: Reported on 02/27/2022 02/22/22   Joycelyn Das, MD  pantoprazole (PROTONIX) 40 MG tablet Take 1 tablet (40 mg total) by mouth daily. Patient not taking: Reported on 05/10/2022 01/23/22   Karsten Ro, MD  QUEtiapine (SEROQUEL) 100 MG tablet Take 1 tablet (100 mg total) by mouth at  bedtime. Patient not taking: Reported on 05/10/2022 02/14/22 04/05/22  Comer Locket, MD  thiamine 100 MG tablet Take 1 tablet (100 mg total) by mouth daily. Patient not taking: Reported on 02/27/2022 02/23/22   Joycelyn Das, MD   MR FOREARM LEFT W WO CONTRAST  Result Date: 05/10/2022 CLINICAL DATA:  Soft tissue mass, forearm, deep EXAM: MRI OF THE LEFT FOREARM WITHOUT AND WITH CONTRAST TECHNIQUE: Multiplanar, multisequence MR imaging of the left forearm was performed before and after the administration of intravenous contrast. CONTRAST:  7.70mL GADAVIST GADOBUTROL 1 MMOL/ML IV SOLN COMPARISON:  Forearm radiograph 05/09/2022. FINDINGS: Bones/Joint/Cartilage No significant marrow signal alteration. The cortex is intact. There is partially visualized wrist and elbow arthritis. There is a trace elbow joint effusion. Muscles and Tendons There is intramuscular edema and enhancement within the medial aspect of the flexor compartment and in the proximal extensor compartment. There is no intramuscular collection. Soft tissues There is extensive skin thickening and subcutaneous soft tissue swelling throughout the forearm. There is a lobular fluid collection coursing halfway circumferentially around the medial forearm from anterior to posterior, circumferentially measuring up to 10.3 cm and up to 1.4 cm in width (axial postcontrast image 20). This measures approximately 13.5 cm from proximal to distal (series 16, image 16). No susceptibility artifact to suggest foreign body or soft tissue gas. IMPRESSION: Extensive cellulitis of the left forearm, with large subcutaneous abscess measuring up to 1.5 cm short axis, circumferentially 10.3 cm around the medial forearm from anterior-posterior, and 13.5 cm in proximal-distal extent. Adjacent reactive or infectious myositis in the medial flexor and extensor compartments. No intramuscular abscess. No signs of soft tissue gas by MRI. No evidence of osteomyelitis. Electronically  Signed   By: Caprice Renshaw M.D.   On: 05/10/2022 13:47   DG Forearm Left  Result Date: 05/09/2022 CLINICAL DATA:  Cellulitis.  Evaluate for air forming organism. EXAM: LEFT FOREARM - 2 VIEW COMPARISON:  None Available. FINDINGS: There is diffuse soft tissue swelling of the arm. No evidence for foreign body or focal soft tissue gas collection. The osseous structures are within normal limits. No fracture or dislocation. IMPRESSION: 1. Diffuse soft tissue swelling. 2. No acute bony abnormality. Electronically Signed   By: Darliss Cheney M.D.   On: 05/09/2022 22:27   DG Humerus Left  Result Date: 05/09/2022 CLINICAL DATA:  Cellulitis.  Evaluate for gas forming organism. EXAM: LEFT HUMERUS - 2+ VIEW COMPARISON:  None Available. FINDINGS: There is no evidence of fracture or other focal bone lesions. There is soft tissue swelling of the arm. No evidence for soft tissue gas or foreign body. IMPRESSION: 1. Soft tissue swelling of the arm. 2. No acute bony abnormality. Electronically Signed   By: Darliss Cheney M.D.   On:  05/09/2022 22:26   - pertinent xrays, CT, MRI studies were reviewed and independently interpreted  Positive ROS: All other systems have been reviewed and were otherwise negative with the exception of those mentioned in the HPI and as above.  Physical Exam: General: No acute distress, resting comfortably Cardiovascular: No pedal edema Respiratory: No cyanosis, no use of accessory musculature Skin: No lesions in the area of chief complaint Neurologic: Sensation intact distally Psychiatric: Patient is at baseline mood and affect  Left Upper Extremity Diffuse forearm swelling with mild erythema No open wounds or sores Questionable palpable fluctuance at ulnar aspect of proximal 1/3 forearm Full ROM of wrist and fingers SILT throughout hand Fingers warm and well perfused w/ BCR    Assessment: 36 yo M w/ polysubstance abuse, BP disorder, HCV with recent IV drug use admitted with left  forearm cellulitis with likely subcutaneous abscess at ulnar aspect of forearm identified on overnight MRI.  Plan: OR today for I&D of left forearm Reviewed risks of surgery with patient including bleeding, continued or worsening infection, damage to neurovascular structures, need for additional surgery Will take intraoperative cultures Will start dressing changes tomorrow Remainder per hospitalist service, appreciate their help in managing this patient  Thank you for the consult and the opportunity to see Mr. Amanda Cockayne, M.D. [facility] 11:50 AM

## 2022-05-11 NOTE — Progress Notes (Signed)
Patient refusing to take Suboxone at this time, stating "I will take it after I take a shower." Patient also informed of NPO status and surgery consult, this nurse explained NPO and patient asked "why can't I have anything until they decide?" This nurse again explained the reasons why and risks of eating/drinking before surgery. Patient stated "whatever, let me take my shower." This nurse cleared patient's room of all food and drink. IV wrapped for shower. Will attempt to give Suboxone at a later time.

## 2022-05-11 NOTE — Progress Notes (Signed)
Pacu RN Report to floor given  Gave report to  [employer]. Room: 5M22   Discussed surgery, meds given in OR and Pacu, VS, IV fluids given, EBL, urine output, pain and other pertinent information. Also discussed if pt had any family or friends here or belongings with them.   Discussed pain meds given, dressing w/ 2 penrose drains, no drainage so far, arm is very swollen, elevate on pillows, +CSM to hand/fingers, VSS.   Consult for transition to addiction program.   Pt exits my care.

## 2022-05-11 NOTE — Anesthesia Procedure Notes (Signed)
Procedure Name: Intubation Date/Time: 05/11/2022 5:40 PM  Performed by: Trinna Post., CRNAPre-anesthesia Checklist: Patient identified, Emergency Drugs available, Suction available, Patient being monitored and Timeout performed Patient Re-evaluated:Patient Re-evaluated prior to induction Oxygen Delivery Method: Circle system utilized Preoxygenation: Pre-oxygenation with 100% oxygen Induction Type: IV induction, Rapid sequence and Cricoid Pressure applied Laryngoscope Size: Mac and 4 Grade View: Grade I Tube type: Oral Tube size: 7.5 mm Number of attempts: 1 Airway Equipment and Method: Stylet Placement Confirmation: ETT inserted through vocal cords under direct vision, positive ETCO2 and breath sounds checked- equal and bilateral Secured at: 22 cm Tube secured with: Tape Dental Injury: Teeth and Oropharynx as per pre-operative assessment

## 2022-05-12 DIAGNOSIS — L03114 Cellulitis of left upper limb: Secondary | ICD-10-CM | POA: Diagnosis not present

## 2022-05-12 LAB — COMPREHENSIVE METABOLIC PANEL
ALT: 28 U/L (ref 0–44)
AST: 24 U/L (ref 15–41)
Albumin: 2.5 g/dL — ABNORMAL LOW (ref 3.5–5.0)
Alkaline Phosphatase: 61 U/L (ref 38–126)
Anion gap: 10 (ref 5–15)
BUN: 6 mg/dL (ref 6–20)
CO2: 23 mmol/L (ref 22–32)
Calcium: 8.6 mg/dL — ABNORMAL LOW (ref 8.9–10.3)
Chloride: 99 mmol/L (ref 98–111)
Creatinine, Ser: 0.69 mg/dL (ref 0.61–1.24)
GFR, Estimated: >60 mL/min (ref >60)
Glucose, Bld: 90 mg/dL (ref 70–99)
Potassium: 3.9 mmol/L (ref 3.5–5.1)
Sodium: 132 mmol/L — ABNORMAL LOW (ref 135–145)
Total Bilirubin: 0.4 mg/dL (ref 0.3–1.2)
Total Protein: 6.7 g/dL (ref 6.5–8.1)

## 2022-05-12 LAB — CBC WITH DIFFERENTIAL/PLATELET
Abs Immature Granulocytes: 0.37 10*3/uL — ABNORMAL HIGH (ref 0.00–0.07)
Basophils Absolute: 0.1 10*3/uL (ref 0.0–0.1)
Basophils Relative: 1 %
Eosinophils Absolute: 0.2 10*3/uL (ref 0.0–0.5)
Eosinophils Relative: 1 %
HCT: 31.1 % — ABNORMAL LOW (ref 39.0–52.0)
Hemoglobin: 11 g/dL — ABNORMAL LOW (ref 13.0–17.0)
Immature Granulocytes: 3 %
Lymphocytes Relative: 13 %
Lymphs Abs: 1.8 10*3/uL (ref 0.7–4.0)
MCH: 25.4 pg — ABNORMAL LOW (ref 26.0–34.0)
MCHC: 35.4 g/dL (ref 30.0–36.0)
MCV: 71.8 fL — ABNORMAL LOW (ref 80.0–100.0)
Monocytes Absolute: 1.1 10*3/uL — ABNORMAL HIGH (ref 0.1–1.0)
Monocytes Relative: 8 %
Neutro Abs: 10.5 10*3/uL — ABNORMAL HIGH (ref 1.7–7.7)
Neutrophils Relative %: 74 %
Platelets: 487 10*3/uL — ABNORMAL HIGH (ref 150–400)
RBC: 4.33 MIL/uL (ref 4.22–5.81)
RDW: 14.3 % (ref 11.5–15.5)
WBC: 14 10*3/uL — ABNORMAL HIGH (ref 4.0–10.5)
nRBC: 0 % (ref 0.0–0.2)

## 2022-05-12 MED ORDER — OXYCODONE HCL 5 MG PO TABS
10.0000 mg | ORAL_TABLET | ORAL | Status: AC
  Administered 2022-05-12: 10 mg via ORAL
  Filled 2022-05-12: qty 2

## 2022-05-12 MED ORDER — SODIUM CHLORIDE 0.9 % IV SOLN
2.0000 g | INTRAVENOUS | Status: DC
  Administered 2022-05-12: 2 g via INTRAVENOUS
  Filled 2022-05-12: qty 20

## 2022-05-12 MED ORDER — VANCOMYCIN HCL 1250 MG/250ML IV SOLN
1250.0000 mg | Freq: Two times a day (BID) | INTRAVENOUS | Status: DC
  Administered 2022-05-12 – 2022-05-13 (×2): 1250 mg via INTRAVENOUS
  Filled 2022-05-12 (×3): qty 250

## 2022-05-12 MED ORDER — OXYCODONE HCL 5 MG/5ML PO SOLN: 5.0000 mg | ORAL | Status: AC

## 2022-05-12 NOTE — Progress Notes (Signed)
PROGRESS NOTE   Tristan [name]  DJS:[id number] DOB: 05/21/1986 DOA: 05/09/2022 PCP: Oneita Hurt, No  Brief Narrative:  36 year old male known polysubstance abuse (heroin, methamphetamine, THC) PTSD, bipolar, hep C, continued cigarette smoking Several admissions to [facility] for depression, hallucinations with auditory command as well as SI Several emergency room visits July polysubstance abuse   Presented to [facility] ED 8/17 with swelling on left arm halfway up bicep--- work-up revealed obvious cellulitis-x-rays-no significant fluctuance for bedside I&D   MRI left forearm was ordered showing abscess 8/19 Dr. Frazier Butt of hand surgery consulted-underwent I&D  Hospital-Problem based course  Large abscess left forearm-I&D of left arm Continued IV DU Patient had MRI showing 1.5 X10.3 X 13.5 cm reactive myositis and abscess and he is now status post surgery Pain control anti-inflammatory Naprosyn increased to 500 3 times daily For severe pain Dilaudid IV 1 mg every 2 as needed- may use oxycodone 10 mg every 4 scheduled DC IV fluids Bipolar PTSD Continue hydroxyzine 25 every 6 as needed anxiety Hep C Will need outpatient surveillance Patient encouraged to stop  DVT prophylaxis: SCD Code Status: Full Family Communication: None present Disposition:  Status is: Inpatient Remains inpatient appropriate because:   Probably require spine surgery eval and surgery the left   Consultants:   Hand surgeon Dr. Frazier Butt consulted  Procedures:   I&D of left forearm 8/19  Antimicrobials: As above   Subjective:  States pain is 8/10-sleepy Has not touched breakfast No other complaints concerns   Objective: Vitals:   05/11/22 2000 05/11/22 2005 05/11/22 2024 05/12/22 0455  BP: 123/69  116/71 105/77  Pulse: 71 65 73 (!) 50  Resp: 20 (!) 23 16 18   Temp: 98.1 F (36.7 C)  98.7 F (37.1 C) 98.4 F (36.9 C)  TempSrc:      SpO2: 95% 100% 99% 100%  Weight:      Height:         Intake/Output Summary (Last 24 hours) at 05/12/2022 0853 Last data filed at 05/12/2022 0455 Gross per 24 hour  Intake 1660.47 ml  Output 25 ml  Net 1635.47 ml    Filed Weights   05/10/22 0240 05/10/22 1533  Weight: 77.1 kg 81.2 kg    Examination:  Awake coherent no distress EOMI NCAT Left forearm is wrapped with dressing I did not examine wound S1-S2 no murmur Abdomen soft no rebound Grossly intact neurologically  Data Reviewed: personally reviewed   CBC    Component Value Date/Time   WBC 16.8 (H) 05/11/2022 0256   RBC 3.85 (L) 05/11/2022 0256   HGB 9.8 (L) 05/11/2022 0256   HCT 27.9 (L) 05/11/2022 0256   PLT 301 05/11/2022 0256   MCV 72.5 (L) 05/11/2022 0256   MCH 25.5 (L) 05/11/2022 0256   MCHC 35.1 05/11/2022 0256   RDW 14.1 05/11/2022 0256   LYMPHSABS 1.4 05/09/2022 2250   MONOABS 1.1 (H) 05/09/2022 2250   EOSABS 0.3 05/09/2022 2250   BASOSABS 0.1 05/09/2022 2250      Latest Ref Rng & Units 05/11/2022    2:56 AM 05/10/2022    5:20 AM 05/09/2022   10:50 PM  CMP  Glucose 70 - 99 mg/dL 05/11/2022  99  98   BUN 6 - 20 mg/dL 7  14  17    Creatinine 0.61 - 1.24 mg/dL 885   0.27   Sodium 135 - 145 mmol/L 132  128  131   Potassium 3.5 - 5.1 mmol/L 3.8  4.0  3.9   Chloride  98 - 111 mmol/L 99  99  98   CO2 22 - 32 mmol/L 22  22  25    Calcium 8.9 - 10.3 mg/dL 8.4  8.0  8.8   Total Protein 6.5 - 8.1 g/dL 6.0  6.1  7.5   Total Bilirubin 0.3 - 1.2 mg/dL 0.3  0.3  0.5   Alkaline Phos 38 - 126 U/L 62  61  81   AST 15 - 41 U/L 21  24  27    ALT 0 - 44 U/L 20  19  22       Radiology Studies: MR FOREARM LEFT W WO CONTRAST  Result Date: 05/10/2022 CLINICAL DATA:  Soft tissue mass, forearm, deep EXAM: MRI OF THE LEFT FOREARM WITHOUT AND WITH CONTRAST TECHNIQUE: Multiplanar, multisequence MR imaging of the left forearm was performed before and after the administration of intravenous contrast. CONTRAST:  7.33mL GADAVIST GADOBUTROL 1 MMOL/ML IV SOLN COMPARISON:  Forearm  radiograph 05/09/2022. FINDINGS: Bones/Joint/Cartilage No significant marrow signal alteration. The cortex is intact. There is partially visualized wrist and elbow arthritis. There is a trace elbow joint effusion. Muscles and Tendons There is intramuscular edema and enhancement within the medial aspect of the flexor compartment and in the proximal extensor compartment. There is no intramuscular collection. Soft tissues There is extensive skin thickening and subcutaneous soft tissue swelling throughout the forearm. There is a lobular fluid collection coursing halfway circumferentially around the medial forearm from anterior to posterior, circumferentially measuring up to 10.3 cm and up to 1.4 cm in width (axial postcontrast image 20). This measures approximately 13.5 cm from proximal to distal (series 16, image 16). No susceptibility artifact to suggest foreign body or soft tissue gas. IMPRESSION: Extensive cellulitis of the left forearm, with large subcutaneous abscess measuring up to 1.5 cm short axis, circumferentially 10.3 cm around the medial forearm from anterior-posterior, and 13.5 cm in proximal-distal extent. Adjacent reactive or infectious myositis in the medial flexor and extensor compartments. No intramuscular abscess. No signs of soft tissue gas by MRI. No evidence of osteomyelitis. Electronically Signed   By: 05/12/2022 M.D.   On: 05/10/2022 13:47     Scheduled Meds:  buprenorphine-naloxone  1 tablet Sublingual Daily   Continuous Infusions:  cefTRIAXone (ROCEPHIN)  IV     lactated ringers Stopped (05/11/22 1218)   vancomycin 1,250 mg (05/12/22 0456)     LOS: 2 days   Time spent: 57  05/13/22, MD [facility] To contact the attending provider between 7A-7P or the covering provider during after hours 7P-7A, please log into the web site [email or web address] and access using universal [place] password for that web site. If you do not have the password, please call the  hospital operator.  05/12/2022, 8:53 AM   Hand surgery consulted

## 2022-05-12 NOTE — Progress Notes (Signed)
Pt is due to void at this time, pt had surgery yesterday. Pt refused for the nurse and the nurse tech to bladder scan him, even after the nurse educated him, pt stated he doesn't feel comfortable with [place] doing that, and stated he doesn't need to use the bathroom right now, on call provider notified, no new orders at this time, will continue to monitor.

## 2022-05-12 NOTE — Progress Notes (Signed)
   Subjective:  No acute events overnight.  Resting comfortably.  Pain well controlled.   Objective:   VITALS:   Vitals:   05/11/22 2000 05/11/22 2005 05/11/22 2024 05/12/22 0455  BP: 123/69  116/71 105/77  Pulse: 71 65 73 (!) 50  Resp: 20 (!) 23 16 18   Temp: 98.1 F (36.7 C)  98.7 F (37.1 C) 98.4 F (36.9 C)  TempSrc:      SpO2: 95% 100% 99% 100%  Weight:      Height:        Gen: NAD, resting comfortably Pulm: Normal WOB on RA CV: Normal rate, BUE warm and well perfused LUE: Dressing w/ some sanguinous drainage, penrose drains in place, sutures in place, no pain w/ ROM of fingers or wrist, no erythema with moderate forearm edema    Lab Results  Component Value Date   WBC 16.8 (H) 05/11/2022   HGB 9.8 (L) 05/11/2022   HCT 27.9 (L) 05/11/2022   MCV 72.5 (L) 05/11/2022   PLT 301 05/11/2022     Assessment/Plan:  36 yo M POD 1 s/p L forearm I&D for large, subcutaneous abscess.  Continue Penrose drains for another day Will follow intraop cultures, currently no growth Reinforce dressing PRN Keep extremity elevated to improve pain and swelling No additional surgeries planned   31, MD 05/12/2022, 10:08 AM (828) 05/14/2022

## 2022-05-13 ENCOUNTER — Encounter (HOSPITAL_COMMUNITY): Payer: Self-pay | Admitting: Orthopedic Surgery

## 2022-05-13 DIAGNOSIS — L03114 Cellulitis of left upper limb: Secondary | ICD-10-CM | POA: Diagnosis not present

## 2022-05-13 MED ORDER — ORAL CARE MOUTH RINSE: 15.0000 mL | OROMUCOSAL | Status: DC | PRN

## 2022-05-13 NOTE — Progress Notes (Signed)
   Subjective:  Pt resting comfortably this AM.  Pain much improved.   Objective:   VITALS:   Vitals:   05/12/22 0455 05/12/22 1000 05/12/22 2053 05/13/22 0930  BP: 105/77 132/73 108/69 121/72  Pulse: (!) 50 (!) 54 (!) 52 64  Resp: 18 18 18 18   Temp: 98.4 F (36.9 C) 98.6 F (37 C) 98.4 F (36.9 C) 98.2 F (36.8 C)  TempSrc:  Oral Oral   SpO2: 100% 97% 100%   Weight:      Height:        Gen: Resting comfortably, NAD Pulm: Normal WOB on RA CV: Normal rate LUE: Dressing with mild sanguinous drainage, penrose drains pulled, swelling of forearm much improved, no pain w/ ROM of elbow/wrist/fingers    Lab Results  Component Value Date   WBC 14.0 (H) 05/12/2022   HGB 11.0 (L) 05/12/2022   HCT 31.1 (L) 05/12/2022   MCV 71.8 (L) 05/12/2022   PLT 487 (H) 05/12/2022     Assessment/Plan:  36 yo Tristan [name] POD 2 s/p L forearm I&D for large, subcutaneous abscess.   Drains pulled this AM No expressible drainage from wounds Intraop cultures with no growth No plans for additional surgery Can follow up with me in one week after discharge  31, MD 05/13/2022, 9:Tristan AM (828) 05/15/2022

## 2022-05-13 NOTE — Progress Notes (Signed)
PROGRESS NOTE   Tristan [name]  DUK:[id number] DOB: December 26, 1985 DOA: 05/09/2022 PCP: Oneita Hurt, No  Brief Narrative:  36 year old male known polysubstance abuse (heroin, methamphetamine, THC) PTSD, bipolar, hep C, continued cigarette smoking Several admissions to [facility] for depression, hallucinations with auditory command as well as SI Several emergency room visits July polysubstance abuse   Presented to [facility] ED 8/17 with swelling on left arm halfway up bicep--- work-up revealed obvious cellulitis-x-rays-no significant fluctuance for bedside I&D   MRI left forearm was ordered showing abscess 8/19 Dr. Frazier Butt of hand surgery consulted-underwent I&D  Hospital-Problem based course  Large abscess left forearm-I&D of left arm Continued IV DU Patient had MRI showing 1.5 X10.3 X 13.5 cm reactive myositis and abscess and he is now status post surgery--it looks like drains will be removed 8/21? Await culture results from 8/19 to narrow Pain control anti-inflammatory Naprosyn increased to 500 3 times daily For severe pain Dilaudid IV 1 mg every 2 as needed- may use oxycodone 10 mg every 4 scheduled Given his prior use of heroin etc. we will probably need to discharge on either methadone or Suboxone--we will ask pharmacy to help with dosing DC IV fluids Bipolar PTSD Continue hydroxyzine 25 every 6 as needed anxiety Hep C Will need outpatient surveillance Patient encouraged to stop  DVT prophylaxis: SCD Code Status: Full Family Communication: None present Disposition:  Status is: Inpatient Remains inpatient appropriate because:   Probably require spine surgery eval and surgery the left   Consultants:   Hand surgeon Dr. Frazier Butt consulted  Procedures:   I&D of left forearm 8/19  Antimicrobials: As above   Subjective:  Coherent awake pain remains 8/10 We had a good discussion about expectations and he understands we cannot likely discharge him on high-dose of pain meds and  that he would have to transition to Suboxone or methadone I have asked pharmacy to help with this   Objective: Vitals:   05/12/22 0455 05/12/22 1000 05/12/22 2053 05/13/22 0930  BP: 105/77 132/73 108/69 121/72  Pulse: (!) 50 (!) 54 (!) 52 64  Resp: 18 18 18 18   Temp: 98.4 F (36.9 C) 98.6 F (37 C) 98.4 F (36.9 C) 98.2 F (36.8 C)  TempSrc:  Oral Oral   SpO2: 100% 97% 100%   Weight:      Height:        Intake/Output Summary (Last 24 hours) at 05/13/2022 0941 Last data filed at 05/13/2022 0900 Gross per 24 hour  Intake 1140 ml  Output 0 ml  Net 1140 ml    Filed Weights   05/10/22 0240 05/10/22 1533  Weight: 77.1 kg 81.2 kg    Examination:  Awake coherent no distress EOMI NCAT no focal deficit Chest is clear no wheeze rales rhonchi Abdomen is soft Left lower extremity is loosely bandaged I did not examine the wound Neurologically is intact  Data Reviewed: personally reviewed   CBC    Component Value Date/Time   WBC 14.0 (H) 05/12/2022 1447   RBC 4.33 05/12/2022 1447   HGB 11.0 (L) 05/12/2022 1447   HCT 31.1 (L) 05/12/2022 1447   PLT 487 (H) 05/12/2022 1447   MCV 71.8 (L) 05/12/2022 1447   MCH 25.4 (L) 05/12/2022 1447   MCHC 35.4 05/12/2022 1447   RDW 14.3 05/12/2022 1447   LYMPHSABS 1.8 05/12/2022 1447   MONOABS 1.1 (H) 05/12/2022 1447   EOSABS 0.2 05/12/2022 1447   BASOSABS 0.1 05/12/2022 1447      Latest Ref Rng &  Units 05/12/2022    2:47 PM 05/11/2022    2:56 AM 05/10/2022    5:20 AM  CMP  Glucose 70 - 99 mg/dL 90  387  99   BUN 6 - 20 mg/dL 6  7  14    Creatinine 0.61 - 1.24 mg/dL  5.64  3.32   Sodium 135 - 145 mmol/L 132  132  128   Potassium 3.5 - 5.1 mmol/L 3.9  3.8  4.0   Chloride 98 - 111 mmol/L 99  99  99   CO2 22 - 32 mmol/L 23  22  22    Calcium 8.9 - 10.3 mg/dL 8.6  8.4  8.0   Total Protein 6.5 - 8.1 g/dL 6.7  6.0  6.1   Total Bilirubin 0.3 - 1.2 mg/dL 0.4  0.3  0.3   Alkaline Phos 38 - 126 U/L 61  62  61   AST 15 - 41 U/L 24   21  24    ALT 0 - 44 U/L 28  20  19       Radiology Studies: No results found.   Scheduled Meds:   Continuous Infusions:  cefTRIAXone (ROCEPHIN)  IV 2 g (05/12/22 1200)   vancomycin 1,250 mg (05/13/22 0804)     LOS: 3 days   Time spent: 79  , MD [facility] To contact the attending provider between 7A-7P or the covering provider during after hours 7P-7A, please log into the web site [email or web address] and access using universal [place] password for that web site. If you do not have the password, please call the hospital operator.  05/13/2022, 9:41 AM   Hand surgery consulted

## 2022-05-13 NOTE — Discharge Summary (Signed)
Physician Discharge Summary  Tristan [name] ZOX:[id number] DOB: 08-11-1986 DOA: 05/09/2022  PCP: Pcp, No  Admit date: 05/09/2022 Discharge date: 05/13/2022  Time spent: 33 minutes  Recommendations for Outpatient Follow-up:  None patient left AGAINST MEDICAL ADVICE  Discharge Diagnoses:  MAIN problem for hospitalization   Abscess of left forearm  Please see below for itemized issues addressed in HOpsital- refer to other progress notes for clarity if needed  Discharge Condition: Guarded  Diet recommendation: None recommended  Filed Weights   05/10/22 0240 05/10/22 1533  Weight: 77.1 kg 81.2 kg    History of present illness:  36 year old male known polysubstance abuse (heroin, methamphetamine, THC) PTSD, bipolar, hep C, continued cigarette smoking Several admissions to [facility] for depression, hallucinations with auditory command as well as SI Several emergency room visits July polysubstance abuse   Presented to [facility] 8/17 with swelling on left arm halfway up bicep--- work-up revealed obvious cellulitis-x-rays-no significant fluctuance for bedside I&D   MRI left forearm was ordered showing abscess 8/19 Dr. Frazier Butt of hand surgery consulted-underwent I&D   Hospital-Problem based course   Large abscess left forearm-I&D of left arm Continued IV DU Patient had MRI showing 1.5 X10.3 X 13.5 cm reactive myositis and abscess and he is now status post surgery Drains were removed 8/21 and patient was kept on IV antibiotics I was subsequently informed that patient left AGAINST MEDICAL ADVICE as such no antibiotics no IV meds and no pain meds were prescribed He is at very high risk for readmission worsening as well as loss of limb Bipolar PTSD Continue hydroxyzine 25 every 6 as needed anxiety Hep C Will need outpatient surveillance Patient encouraged to stop    Discharge Exam: Vitals:   05/12/22 2053 05/13/22 0930  BP: 108/69 121/72  Pulse: (!) 52 64  Resp: 18 18   Temp:  98.2 F (36.8 C)  SpO2: 100%     Subj on day of d/c   Patient was initially cooperative when I saw him however I was informed subsequently he pulled out his IV and left-see separate nursing documentation  General Exam on discharge  Not examined on discharge as did not inform that he was planning to leave  Discharge Instructions  No instructions for given as patient left   Allergies  Allergen Reactions   Tomato Itching and Other (See Comments)    Throat and tongue itches      The results of significant diagnostics from this hospitalization (including imaging, microbiology, ancillary and laboratory) are listed below for reference.    Significant Diagnostic Studies: MR FOREARM LEFT W WO CONTRAST  Result Date: 05/10/2022 CLINICAL DATA:  Soft tissue mass, forearm, deep EXAM: MRI OF THE LEFT FOREARM WITHOUT AND WITH CONTRAST TECHNIQUE: Multiplanar, multisequence MR imaging of the left forearm was performed before and after the administration of intravenous contrast. CONTRAST:  7.41mL GADAVIST GADOBUTROL 1 MMOL/ML IV SOLN COMPARISON:  Forearm radiograph 05/09/2022. FINDINGS: Bones/Joint/Cartilage No significant marrow signal alteration. The cortex is intact. There is partially visualized wrist and elbow arthritis. There is a trace elbow joint effusion. Muscles and Tendons There is intramuscular edema and enhancement within the medial aspect of the flexor compartment and in the proximal extensor compartment. There is no intramuscular collection. Soft tissues There is extensive skin thickening and subcutaneous soft tissue swelling throughout the forearm. There is a lobular fluid collection coursing halfway circumferentially around the medial forearm from anterior to posterior, circumferentially measuring up to 10.3 cm and up to 1.4 cm in width (  axial postcontrast image 20). This measures approximately 13.5 cm from proximal to distal (series 16, image 16). No susceptibility artifact to  suggest foreign body or soft tissue gas. IMPRESSION: Extensive cellulitis of the left forearm, with large subcutaneous abscess measuring up to 1.5 cm short axis, circumferentially 10.3 cm around the medial forearm from anterior-posterior, and 13.5 cm in proximal-distal extent. Adjacent reactive or infectious myositis in the medial flexor and extensor compartments. No intramuscular abscess. No signs of soft tissue gas by MRI. No evidence of osteomyelitis. Electronically Signed   By: Caprice Renshaw M.D.   On: 05/10/2022 13:47   DG Forearm Left  Result Date: 05/09/2022 CLINICAL DATA:  Cellulitis.  Evaluate for air forming organism. EXAM: LEFT FOREARM - 2 VIEW COMPARISON:  None Available. FINDINGS: There is diffuse soft tissue swelling of the arm. No evidence for foreign body or focal soft tissue gas collection. The osseous structures are within normal limits. No fracture or dislocation. IMPRESSION: 1. Diffuse soft tissue swelling. 2. No acute bony abnormality. Electronically Signed   By: Darliss Cheney M.D.   On: 05/09/2022 22:27   DG Humerus Left  Result Date: 05/09/2022 CLINICAL DATA:  Cellulitis.  Evaluate for gas forming organism. EXAM: LEFT HUMERUS - 2+ VIEW COMPARISON:  None Available. FINDINGS: There is no evidence of fracture or other focal bone lesions. There is soft tissue swelling of the arm. No evidence for soft tissue gas or foreign body. IMPRESSION: 1. Soft tissue swelling of the arm. 2. No acute bony abnormality. Electronically Signed   By: Darliss Cheney M.D.   On: 05/09/2022 22:26    Microbiology: Recent Results (from the past 240 hour(s))  Blood culture (routine x 2)     Status: None (Preliminary result)   Collection Time: 05/09/22 10:50 PM   Specimen: BLOOD  Result Value Ref Range Status   Specimen Description BLOOD RIGHT ANTECUBITAL  Final   Special Requests   Final    BOTTLES DRAWN AEROBIC AND ANAEROBIC Blood Culture adequate volume   Culture   Final    NO GROWTH 4 DAYS Performed at  [facility], [address] [place], [place] [address]    Report Status PENDING  Incomplete  Blood culture (routine x 2)     Status: None (Preliminary result)   Collection Time: 05/09/22 10:50 PM   Specimen: BLOOD RIGHT HAND  Result Value Ref Range Status   Specimen Description BLOOD RIGHT HAND  Final   Special Requests AEROBIC BOTTLE ONLY Blood Culture adequate volume  Final   Culture   Final    NO GROWTH 4 DAYS Performed at [facility], [address], [place], [place] [address]    Report Status PENDING  Incomplete  MRSA Next Gen by PCR, Nasal     Status: None   Collection Time: 05/11/22  3:22 PM   Specimen: Nasal Mucosa; Nasal Swab  Result Value Ref Range Status   MRSA by PCR Next Gen NOT DETECTED NOT DETECTED Final    Comment: (NOTE) The GeneXpert MRSA Assay (FDA approved for NASAL specimens only), is one component of a comprehensive MRSA colonization surveillance program. It is not intended to diagnose MRSA infection nor to guide or monitor treatment for MRSA infections. Test performance is not FDA approved in patients less than 64 years old. Performed at [facility], [address] [place], [place] [address]   Aerobic/Anaerobic Culture w Gram Stain (surgical/deep wound)     Status: None (Preliminary result)  Collection Time: 05/11/22  6:00 PM   Specimen: PATH Other; Body Fluid  Result Value Ref Range Status   Specimen Description WOUND  Final   Special Requests LEFT FOREARM  Final   Gram Stain   Final    FEW WBC PRESENT, PREDOMINANTLY PMN FEW GRAM POSITIVE COCCI IN PAIRS    Culture   Final    CULTURE REINCUBATED FOR BETTER GROWTH Performed at [facility], [address], [place], [place] [address]    Report Status PENDING  Incomplete     Labs: Basic Metabolic Panel: Recent Labs  Lab 05/09/22 2250 05/10/22 0520 05/11/22 0256 05/12/22 1447  NA 131* 128* 132* 132*  K 3.9 4.0 3.8 3.9  CL 98 99 99 99  CO2 25 22 22 23   GLUCOSE 98  99 119* 90  BUN 17 14 7 6   CREATININE 1.02 0.86 0.52* 0.69  CALCIUM 8.8* 8.0* 8.4* 8.6*   Liver Function Tests: Recent Labs  Lab 05/09/22 2250 05/10/22 0520 05/11/22 0256 05/12/22 1447  AST 27 24 21 24   ALT 22 19 20 28   ALKPHOS 81 61 62 61  BILITOT 0.5 0.3 0.3 0.4  PROT 7.5 6.1* 6.0* 6.7  ALBUMIN 3.1* 2.6* 2.3* 2.5*   No results for input(s): "LIPASE", "AMYLASE" in the last 168 hours. No results for input(s): "AMMONIA" in the last 168 hours. CBC: Recent Labs  Lab 05/09/22 2250 05/10/22 0520 05/11/22 0256 05/12/22 1447  WBC 21.8* 18.8* 16.8* 14.0*  NEUTROABS 18.7*  --   --  10.5*  HGB 10.2* 9.4* 9.8* 11.0*  HCT 29.2* 27.8* 27.9* 31.1*  MCV 74.1* 75.5* 72.5* 71.8*  PLT 328 290 301 487*   Cardiac Enzymes: No results for input(s): "CKTOTAL", "CKMB", "CKMBINDEX", "TROPONINI" in the last 168 hours. BNP: BNP (last 3 results) Recent Labs    02/10/22 0441  BNP 40.7    ProBNP (last 3 results) No results for input(s): "PROBNP" in the last 8760 hours.  CBG: No results for input(s): "GLUCAP" in the last 168 hours.     Signed:  05/12/22 MD   [facility] 05/13/2022, 10:20 AM

## 2022-05-13 NOTE — Progress Notes (Signed)
Patient left AMA. Told the secretary that he wants his paper to leave the secretary told him that there were no Discharge order in as yet and that your nurse is in a meeting at this time. He told her he does not want to wait and that he is fine. He proceeded to take his IV out and leave. The secretary saw him at the elevators and told him not to leave that he just need to wait. Patient proceeded to and on the elevator. RN notify the AD and also the MD.

## 2022-05-14 LAB — CULTURE, BLOOD (ROUTINE X 2)
Culture: NO GROWTH
Culture: NO GROWTH
Special Requests: ADEQUATE
Special Requests: ADEQUATE

## 2022-05-15 NOTE — Anesthesia Postprocedure Evaluation (Signed)
Anesthesia Post Note  Patient: Tristan [name]  Procedure(s) Performed: IRRIGATION AND DEBRIDEMENT FOREARM (Left: Arm Lower)     Patient location during evaluation: PACU Anesthesia Type: General Level of consciousness: awake and alert Pain management: pain level controlled Vital Signs Assessment: post-procedure vital signs reviewed and stable Respiratory status: spontaneous breathing, nonlabored ventilation, respiratory function stable and patient connected to nasal cannula oxygen Cardiovascular status: blood pressure returned to baseline and stable Postop Assessment: no apparent nausea or vomiting Anesthetic complications: no   No notable events documented.         Jager Koska

## 2022-05-16 LAB — AEROBIC/ANAEROBIC CULTURE W GRAM STAIN (SURGICAL/DEEP WOUND)

## 2022-05-22 ENCOUNTER — Emergency Department (HOSPITAL_COMMUNITY): Payer: PRIVATE HEALTH INSURANCE

## 2022-05-22 ENCOUNTER — Other Ambulatory Visit: Payer: Self-pay

## 2022-05-22 ENCOUNTER — Observation Stay (HOSPITAL_COMMUNITY)
Admission: EM | Admit: 2022-05-22 | Discharge: 2022-05-23 | Discharge status: Against Medical Advice | Payer: PRIVATE HEALTH INSURANCE | Attending: Family Medicine | Admitting: Family Medicine

## 2022-05-22 DIAGNOSIS — T8130XA Disruption of wound, unspecified, initial encounter: Secondary | ICD-10-CM | POA: Diagnosis not present

## 2022-05-22 DIAGNOSIS — I10 Essential (primary) hypertension: Secondary | ICD-10-CM | POA: Diagnosis not present

## 2022-05-22 DIAGNOSIS — F1120 Opioid dependence, uncomplicated: Secondary | ICD-10-CM | POA: Diagnosis present

## 2022-05-22 DIAGNOSIS — M79602 Pain in left arm: Secondary | ICD-10-CM

## 2022-05-22 DIAGNOSIS — F17210 Nicotine dependence, cigarettes, uncomplicated: Secondary | ICD-10-CM | POA: Diagnosis not present

## 2022-05-22 DIAGNOSIS — T8131XA Disruption of external operation (surgical) wound, not elsewhere classified, initial encounter: Secondary | ICD-10-CM | POA: Diagnosis present

## 2022-05-22 DIAGNOSIS — Z79899 Other long term (current) drug therapy: Secondary | ICD-10-CM | POA: Insufficient documentation

## 2022-05-22 DIAGNOSIS — G9340 Encephalopathy, unspecified: Secondary | ICD-10-CM | POA: Diagnosis not present

## 2022-05-22 DIAGNOSIS — L03114 Cellulitis of left upper limb: Secondary | ICD-10-CM | POA: Diagnosis not present

## 2022-05-22 DIAGNOSIS — F1910 Other psychoactive substance abuse, uncomplicated: Secondary | ICD-10-CM | POA: Diagnosis present

## 2022-05-22 LAB — CBC WITH DIFFERENTIAL/PLATELET
Abs Immature Granulocytes: 0 10*3/uL (ref 0.00–0.07)
Basophils Absolute: 0 10*3/uL (ref 0.0–0.1)
Basophils Relative: 0 %
Eosinophils Absolute: 0.1 10*3/uL (ref 0.0–0.5)
Eosinophils Relative: 2 %
HCT: 26.6 % — ABNORMAL LOW (ref 39.0–52.0)
Hemoglobin: 9.1 g/dL — ABNORMAL LOW (ref 13.0–17.0)
Lymphocytes Relative: 27 %
Lymphs Abs: 1.5 10*3/uL (ref 0.7–4.0)
MCH: 25.3 pg — ABNORMAL LOW (ref 26.0–34.0)
MCHC: 34.2 g/dL (ref 30.0–36.0)
MCV: 73.9 fL — ABNORMAL LOW (ref 80.0–100.0)
Monocytes Absolute: 0.2 10*3/uL (ref 0.1–1.0)
Monocytes Relative: 3 %
Neutro Abs: 3.9 10*3/uL (ref 1.7–7.7)
Neutrophils Relative %: 68 %
Platelets: 314 10*3/uL (ref 150–400)
RBC: 3.6 MIL/uL — ABNORMAL LOW (ref 4.22–5.81)
RDW: 15.1 % (ref 11.5–15.5)
WBC: 5.7 10*3/uL (ref 4.0–10.5)
nRBC: 0 % (ref 0.0–0.2)
nRBC: 0 /100 WBC

## 2022-05-22 LAB — BASIC METABOLIC PANEL
Anion gap: 4 — ABNORMAL LOW (ref 5–15)
BUN: 15 mg/dL (ref 6–20)
CO2: 25 mmol/L (ref 22–32)
Calcium: 8.7 mg/dL — ABNORMAL LOW (ref 8.9–10.3)
Chloride: 108 mmol/L (ref 98–111)
Creatinine, Ser: 0.8 mg/dL (ref 0.61–1.24)
GFR, Estimated: >60 mL/min (ref >60)
Glucose, Bld: 88 mg/dL (ref 70–99)
Potassium: 3.7 mmol/L (ref 3.5–5.1)
Sodium: 137 mmol/L (ref 135–145)

## 2022-05-22 LAB — SEDIMENTATION RATE: Sed Rate: 32 mm/hr — ABNORMAL HIGH (ref 0–16)

## 2022-05-22 LAB — C-REACTIVE PROTEIN: CRP: 2.3 mg/dL — ABNORMAL HIGH (ref <1.0)

## 2022-05-22 MED ORDER — IOHEXOL 300 MG/ML  SOLN
100.0000 mL | Freq: Once | INTRAMUSCULAR | Status: AC | PRN
  Administered 2022-05-22: 100 mL via INTRAVENOUS

## 2022-05-22 MED ORDER — SODIUM CHLORIDE 0.9 % IV SOLN
1.0000 g | Freq: Once | INTRAVENOUS | Status: AC
  Administered 2022-05-23: 1 g via INTRAVENOUS
  Filled 2022-05-22: qty 10

## 2022-05-22 MED ORDER — CEFAZOLIN SODIUM-DEXTROSE 1-4 GM/50ML-% IV SOLN: 1.0000 g | Freq: Three times a day (TID) | INTRAVENOUS | Status: DC

## 2022-05-22 MED ORDER — ONDANSETRON HCL 4 MG/2ML IJ SOLN: 4.0000 mg | Freq: Four times a day (QID) | INTRAMUSCULAR | Status: DC | PRN

## 2022-05-22 MED ORDER — ONDANSETRON HCL 4 MG PO TABS: 4.0000 mg | ORAL_TABLET | Freq: Four times a day (QID) | ORAL | Status: DC | PRN

## 2022-05-22 MED ORDER — VANCOMYCIN HCL 1750 MG/350ML IV SOLN
1750.0000 mg | Freq: Once | INTRAVENOUS | Status: AC
  Administered 2022-05-23: 1750 mg via INTRAVENOUS
  Filled 2022-05-22: qty 350

## 2022-05-22 MED ORDER — ACETAMINOPHEN 325 MG PO TABS: 650.0000 mg | ORAL_TABLET | Freq: Four times a day (QID) | ORAL | Status: DC | PRN

## 2022-05-22 MED ORDER — ACETAMINOPHEN 650 MG RE SUPP: 650.0000 mg | Freq: Four times a day (QID) | RECTAL | Status: DC | PRN

## 2022-05-22 NOTE — Assessment & Plan Note (Signed)
Ortho surg called by EDP: They say probably going to be non-surgical. But recommend calling his hand surgeon Dr. Frazier Butt in AM and keeping NPO just-in-case.

## 2022-05-22 NOTE — Assessment & Plan Note (Addendum)
Currently intoxicated.

## 2022-05-22 NOTE — ED Provider Notes (Signed)
  [facility] EMERGENCY DEPARTMENT Provider Note   CSN: [id number] Arrival date & time: 05/22/22  1944     History Chief Complaint  Patient presents with   Arm Pain    HPI Tristan [name] is a 36 y.o. male presenting for arm pain.  He has an extensive medical history including recent admission for deep space infection of the arm.  He had a surgical intervention for abscess and then left hospital AMA.   Patient's recorded medical, surgical, social, medication list and allergies were reviewed in the Snapshot window as part of the initial history.   Review of Systems   Review of Systems  Physical Exam Updated Vital Signs BP (!) 133/91   Pulse 64   Temp 97.9 F (36.6 C) (Temporal)   Resp 15   SpO2 100%  Physical Exam      ED Course/ Medical Decision Making/ A&P    Procedures Procedures   Medications Ordered in ED Medications  cefTRIAXone (ROCEPHIN) 1 g in sodium chloride 0.9 % 100 mL IVPB (has no administration in time range)  vancomycin (VANCOREADY) IVPB 1750 mg/350 mL (has no administration in time range)  acetaminophen (TYLENOL) tablet 650 mg (has no administration in time range)    Or  acetaminophen (TYLENOL) suppository 650 mg (has no administration in time range)  ondansetron (ZOFRAN) tablet 4 mg (has no administration in time range)    Or  ondansetron (ZOFRAN) injection 4 mg (has no administration in time range)  iohexol (OMNIPAQUE) 300 MG/ML solution 100 mL (100 mLs Intravenous Contrast Given 05/22/22 2147)    Medical Decision Making:    Tristan [name] is a 36 y.o. male who presented to the ED today with arm pain detailed above.     Patient's presentation is complicated by their history of multiple comorbid medical problems.  Patient placed on continuous vitals and telemetry monitoring while in ED which was reviewed periodically.   Complete initial physical exam performed, notably the patient  was hemodynamically  stable in no acute distress.      Reviewed and confirmed nursing documentation for past medical history, family history, social history.    Initial Assessment:   This is a chronically ill 36 year old male presenting with concern for deep space soft tissue infection of the left upper extremity after medical noncompliance in the setting of deep space infection.  Wound appears to slowly be dehiscing.  Discussed with on-call orthopedics who recommended a.m. evaluation by primary surgeon with no indication for acute intervention at this moment.  This is reasonable given CT scan with no focal pathology appreciated other than slowly dehiscing wound secondary to pressure from induration and cellulitis throughout.  Will start patient on broad antibiotics to attempt to salvage the arm and recommend patient be admitted to medicine for ongoing care and management.  Patient willing to be admitted due to ongoing pain and discomfort.  Discussed with hospitalist who agreed with need for admission.  Patient admitted at this time with no further acute events. Clinical Impression:  1. Left arm pain      Admit   Final Clinical Impression(s) / ED Diagnoses Final diagnoses:  Left arm pain    Rx / DC Orders ED Discharge Orders     None         Glyn Ade, MD 05/22/22 2353

## 2022-05-22 NOTE — Assessment & Plan Note (Signed)
Pt with AMS currently, likely due to intoxication with heroin (which he admits to using earlier today), and other substance(s).

## 2022-05-22 NOTE — ED Triage Notes (Signed)
Pt here via [facility] from sidewalk for "not feeling well". Pt ws caught shoplifting and stated he hasn't been feeling good after using heroin earlier today. Pt has been aox4 but is lethargic. Pt has swelling to L arm, has old stitches on wound that appears to be infection on posterior L arm. 16RR, 98% RA, gcs 15

## 2022-05-22 NOTE — Assessment & Plan Note (Signed)
Watch for withdrawal and treat when occurs (pt currently intoxicated).

## 2022-05-22 NOTE — Assessment & Plan Note (Addendum)
Pt with L forearm cellulitis and surgical wound dehiscence. No CT findings to suggest deeper infection at this time. Surg culture from 11 days ago = Group A strep. MRSA PCR nares from 11 days ago = neg. No crepitus on physical exam, no subq air on CT, no abscess on CT. 1. Got rocephin + vanc in ED 2. Normally would put pt on ancef, but I HIGHLY doubt hes going to stay for 7 days in hospital given h/o polysubstance abuse, left AMA during last hospital stay just last week. 1. High risk of untreated / partially treated infection to progress to limb and life threatening infection when he does. 2. Group A strep, despite being very sensitive to antibiotics, has a particularly nasty reputation for progression to limb and life threatening infections 3. Will see what RPH thinks about a dose of Dalbavancin to try and treat his cellulitis, even if he does decide to leave AMA later (as I suspect he will). 1. They agreed, Dalba does cover GAS 2. Plan to dose later since he got vanc dose in ED 3. Putting in Dalba per pharm consult into epic. 4. Consider changing / adding coverage if he does actually stay, but I suspect hes going to leave AMA after hand surgery decides its not surgical at this time tomorrow and his current intoxication wears off. 5. And think that dalba, while not ideal given the situation, does give him the best chance (assuming he does indeed leave AMA) of not losing the arm or his life.

## 2022-05-23 ENCOUNTER — Encounter (HOSPITAL_COMMUNITY): Payer: Self-pay | Admitting: Internal Medicine

## 2022-05-23 DIAGNOSIS — M79602 Pain in left arm: Secondary | ICD-10-CM | POA: Diagnosis not present

## 2022-05-23 DIAGNOSIS — L03114 Cellulitis of left upper limb: Principal | ICD-10-CM

## 2022-05-23 DIAGNOSIS — F1910 Other psychoactive substance abuse, uncomplicated: Secondary | ICD-10-CM

## 2022-05-23 DIAGNOSIS — G9340 Encephalopathy, unspecified: Secondary | ICD-10-CM

## 2022-05-23 DIAGNOSIS — F1120 Opioid dependence, uncomplicated: Secondary | ICD-10-CM | POA: Diagnosis not present

## 2022-05-23 DIAGNOSIS — T8131XA Disruption of external operation (surgical) wound, not elsewhere classified, initial encounter: Secondary | ICD-10-CM

## 2022-05-23 LAB — BASIC METABOLIC PANEL
Anion gap: 4 — ABNORMAL LOW (ref 5–15)
BUN: 12 mg/dL (ref 6–20)
CO2: 26 mmol/L (ref 22–32)
Calcium: 8.6 mg/dL — ABNORMAL LOW (ref 8.9–10.3)
Chloride: 107 mmol/L (ref 98–111)
Creatinine, Ser: 0.82 mg/dL (ref 0.61–1.24)
GFR, Estimated: >60 mL/min (ref >60)
Glucose, Bld: 90 mg/dL (ref 70–99)
Potassium: 4 mmol/L (ref 3.5–5.1)
Sodium: 137 mmol/L (ref 135–145)

## 2022-05-23 LAB — CBC
HCT: 26.6 % — ABNORMAL LOW (ref 39.0–52.0)
Hemoglobin: 9 g/dL — ABNORMAL LOW (ref 13.0–17.0)
MCH: 24.7 pg — ABNORMAL LOW (ref 26.0–34.0)
MCHC: 33.8 g/dL (ref 30.0–36.0)
MCV: 73.1 fL — ABNORMAL LOW (ref 80.0–100.0)
Platelets: 323 10*3/uL (ref 150–400)
RBC: 3.64 MIL/uL — ABNORMAL LOW (ref 4.22–5.81)
RDW: 15.2 % (ref 11.5–15.5)
WBC: 6.6 10*3/uL (ref 4.0–10.5)
nRBC: 0 % (ref 0.0–0.2)

## 2022-05-23 MED ORDER — NAPROXEN 250 MG PO TABS: 500.0000 mg | ORAL_TABLET | Freq: Two times a day (BID) | ORAL | Status: DC | PRN

## 2022-05-23 MED ORDER — HYDROXYZINE HCL 25 MG PO TABS
25.0000 mg | ORAL_TABLET | Freq: Four times a day (QID) | ORAL | Status: DC | PRN
Start: 2022-05-23 — End: 2022-05-23

## 2022-05-23 MED ORDER — CEFAZOLIN SODIUM-DEXTROSE 2-4 GM/100ML-% IV SOLN: 2.0000 g | Freq: Three times a day (TID) | INTRAVENOUS | Status: DC

## 2022-05-23 NOTE — H&P (Signed)
History and Physical    Patient: Tristan [name] OTL:[id number] DOB: [birth year] DOA: 05/22/2022 DOS: the patient was seen and examined on 05/23/2022 PCP: Pcp, No  Patient coming from: Homeless  Chief Complaint:  Chief Complaint  Patient presents with   Arm Pain   HPI: Marquiz Sotelo is a 36 y.o. male with medical history significant of polysubstance abuse (ongoing), HCV.  Pt recently admitted on 8/17-8/19 for L forearm cellulitis and myositis.  Went to OR for I+D.  Culture grew Group A strep.  Pt left AMA on 8/19 unfortunately.  Today pt brought in by EMS from sidewalk.  States has been feeling poorly since used heroin earlier today.  Also got caught shoplifting.    Review of Systems: As mentioned in the history of present illness. All other systems reviewed and are negative. Past Medical History:  Diagnosis Date   Bipolar 1 disorder (HCC)    Hepatitis C    Hypertension    PTSD (post-traumatic stress disorder)    Past Surgical History:  Procedure Laterality Date   I & D EXTREMITY Left 05/11/2022   Procedure: IRRIGATION AND DEBRIDEMENT FOREARM;  Surgeon: Marlyne Beards, MD;  Location: [facility] OR;  Service: Orthopedics;  Laterality: Left;   NO PAST SURGERIES     Social History:  reports that he has been smoking cigarettes. He has been smoking an average of .5 packs per day. He has never used smokeless tobacco. He reports current alcohol use. He reports current drug use. Drugs: Marijuana, Heroin, Cocaine, Methamphetamines, and Morphine.  Allergies  Allergen Reactions   Tomato Itching and Other (See Comments)    Throat and tongue itches    Family History  Problem Relation Age of Onset   Other Father    Psychiatric Illness Father     Prior to Admission medications   Medication Sig Start Date End Date Taking? Authorizing Provider  escitalopram (LEXAPRO) 10 MG tablet Take 1 tablet (10 mg total) by mouth daily. Patient not taking: Reported on 05/10/2022  02/14/22 04/05/22  Comer Locket, MD  folic acid (FOLVITE) 1 MG tablet Take 1 tablet (1 mg total) by mouth daily. Patient not taking: Reported on 02/27/2022 02/23/22   Joycelyn Das, MD  gabapentin (NEURONTIN) 100 MG capsule Take 1 capsule (100 mg total) by mouth 3 (three) times daily. Patient not taking: Reported on 05/10/2022 02/14/22 04/05/22  Comer Locket, MD  hydrOXYzine (ATARAX) 25 MG tablet Take 1 tablet (25 mg total) by mouth every 6 (six) hours as needed for anxiety. Patient not taking: Reported on 02/27/2022 02/22/22   Joycelyn Das, MD  loperamide (IMODIUM) 2 MG capsule Take 1 capsule (2 mg total) by mouth as needed for diarrhea or loose stools (diarrhea). Patient not taking: Reported on 02/27/2022 02/22/22   Joycelyn Das, MD  methocarbamol (ROBAXIN) 500 MG tablet Take 1 tablet (500 mg total) by mouth every 8 (eight) hours as needed for muscle spasms. Patient not taking: Reported on 02/27/2022 02/22/22   Joycelyn Das, MD  Multiple Vitamin (MULTIVITAMIN WITH MINERALS) TABS tablet Take 1 tablet by mouth daily. Patient not taking: Reported on 02/27/2022 02/23/22   Joycelyn Das, MD  naproxen (NAPROSYN) 500 MG tablet Take 1 tablet (500 mg total) by mouth 2 (two) times daily as needed (aching, pain, or discomfort). Patient not taking: Reported on 02/27/2022 02/22/22   Joycelyn Das, MD  pantoprazole (PROTONIX) 40 MG tablet Take 1 tablet (40 mg total) by mouth daily. Patient not taking: Reported on 05/10/2022 01/23/22  Karsten Ro, MD  QUEtiapine (SEROQUEL) 100 MG tablet Take 1 tablet (100 mg total) by mouth at bedtime. Patient not taking: Reported on 05/10/2022 02/14/22 04/05/22  Comer Locket, MD  thiamine 100 MG tablet Take 1 tablet (100 mg total) by mouth daily. Patient not taking: Reported on 02/27/2022 02/23/22   Joycelyn Das, MD    Physical Exam: Vitals:   05/22/22 2200 05/22/22 2215 05/22/22 2230 05/22/22 2245  BP: 133/87 (!) 127/91 (!) 133/91   Pulse: (!) 47 (!) 56 (!) 58 64  Resp:  17 14 14 15   Temp:      TempSrc:      SpO2: 98% 99% 99% 100%   Constitutional: Intoxicated / lethargic Eyes: PERRL, lids and conjunctivae normal ENMT: Mucous membranes are moist. Posterior pharynx clear of any exudate or lesions.Normal dentition.  Neck: normal, supple, no masses, no thyromegaly Respiratory: clear to auscultation bilaterally, no wheezing, no crackles. Normal respiratory effort. No accessory muscle use.  Cardiovascular: Regular rate and rhythm, no murmurs / rubs / gallops. No extremity edema. 2+ pedal pulses. No carotid bruits.  Abdomen: no tenderness, no masses palpated. No hepatosplenomegaly. Bowel sounds positive.  Musculoskeletal: no clubbing / cyanosis. No joint deformity upper and lower extremities. Good ROM, no contractures. Normal muscle tone.  Skin: Significant induration and edema of L forearm, no subq emphysema.  Surgical wound dehiscence noted.     Neurologic: CN 2-12 grossly intact. Sensation intact, DTR normal. Strength 5/5 in all 4.  Psychiatric: Intoxicated  Data Reviewed:    CBC    Component Value Date/Time   WBC 5.7 05/22/2022 2019   RBC 3.60 (L) 05/22/2022 2019   HGB 9.1 (L) 05/22/2022 2019   HCT 26.6 (L) 05/22/2022 2019   PLT 314 05/22/2022 2019   MCV 73.9 (L) 05/22/2022 2019   MCH 25.3 (L) 05/22/2022 2019   MCHC 34.2 05/22/2022 2019   RDW 15.1 05/22/2022 2019   LYMPHSABS 1.5 05/22/2022 2019   MONOABS 0.2 05/22/2022 2019   EOSABS 0.1 05/22/2022 2019   BASOSABS 0.0 05/22/2022 2019     Assessment and Plan: * Cellulitis of left forearm Pt with L forearm cellulitis and surgical wound dehiscence. No CT findings to suggest deeper infection at this time. Surg culture from 11 days ago = Group A strep. MRSA PCR nares from 11 days ago = neg. No crepitus on physical exam, no subq air on CT, no abscess on CT. Got rocephin + vanc in ED Normally would put pt on ancef, but I HIGHLY doubt hes going to stay for 7 days in hospital given h/o  polysubstance abuse, left AMA during last hospital stay just last week. High risk of untreated / partially treated infection to progress to limb and life threatening infection when he does. Group A strep, despite being very sensitive to antibiotics, has a particularly nasty reputation for progression to limb and life threatening infections Will see what RPH thinks about a dose of Dalbavancin to try and treat his cellulitis, even if he does decide to leave AMA later (as I suspect he will). They agreed, Dalba does cover GAS Plan to dose later since he got vanc dose in ED Putting in Dalba per pharm consult into epic. Consider changing / adding coverage if he does actually stay, but I suspect hes going to leave AMA after hand surgery decides its not surgical at this time tomorrow and his current intoxication wears off. And think that dalba, while not ideal given the situation, does give him  the best chance (assuming he does indeed leave AMA) of not losing the arm or his life.  Surgical wound dehiscence, initial encounter Ortho surg called by EDP: They say probably going to be non-surgical. But recommend calling his hand surgeon Dr. Frazier Butt in AM and keeping NPO just-in-case.   Acute encephalopathy Pt with AMS currently, likely due to intoxication with heroin (which he admits to using earlier today), and other substance(s).  Heroin use disorder, severe, dependence (HCC) Watch for withdrawal and treat when occurs (pt currently intoxicated).  Polysubstance abuse (HCC) Currently intoxicated.      Advance Care Planning:   Code Status: Full Code  Consults: EDP spoke with Ortho surg  Family Communication: No family in room  Severity of Illness: The appropriate patient status for this patient is OBSERVATION. Observation status is judged to be reasonable and necessary in order to provide the required intensity of service to ensure the patient's safety. The patient's presenting symptoms,  physical exam findings, and initial radiographic and laboratory data in the context of their medical condition is felt to place them at decreased risk for further clinical deterioration. Furthermore, it is anticipated that the patient will be medically stable for discharge from the hospital within 2 midnights of admission.   Author: Hillary Bow., DO 05/23/2022 12:04 AM  For on call review [email or web address].

## 2022-05-23 NOTE — ED Notes (Addendum)
This patient started screaming out of the room that he was leaving and someone needed to come to the room. This RN went to the patient's doorway and asked the patient what was wrong. Pt stated we were being mean and not attending to him in a timely manner and that he wanted to leave. This RN explained that he was a grown adult capable of making his own decisions but that someone felt he needed to stay since he was being admitted so that I did not think that was a wise choice. Patient stated he did not care he was leaving. This RN informed him that he needed to wait for his Nurse to get back out of another patient's room and that we also needed to remove the IV. Patient stated he was not waiting and he would rip the IV out himself. This RN removed the IV so that the patient would not harm himself or attempt to leave with the IV. Patient then began to demand a bus pass from this RN. This RN informed the patient that because he was leaving against medical advice that he would have to find his own ride that the hospital would not provide him with transportation. Patient stated you guys are really going to do me like that. This RN explained we were not doing the patient any kind of way that he did it to himself by making the choice to leave AMA. Patient refused to sign the [facility] paperwork and stormed out. This RN informed his primary nurse and another nurse on the unit messaged the admitting doctor.

## 2022-05-23 NOTE — Discharge Summary (Signed)
Physician Discharge Summary   Patient: Tristan [name] MRN: [id number] DOB: 09-15-1986  Admit date:     05/22/2022  Discharge date: 05/23/22  Discharge Physician: Rhetta Mura   PCP: Pcp, No   Recommendations at discharge:   None were made as patient left once again AGAINST MEDICAL ADVICE  Discharge Diagnoses: Principal Problem:   Cellulitis of left forearm Active Problems:   Surgical wound dehiscence, initial encounter   Polysubstance abuse (HCC)   Heroin use disorder, severe, dependence (HCC)   Acute encephalopathy  Resolved Problems:   * No resolved hospital problems. *  Hospital Course:  36 year old male IV heroin habituation, polysubstance abuse, bipolar, hep C, continued cigarette smoking, [facility] admissions for depressions and hallucinations with auditory command recent hospital visit 8/17 through-8/19 wound culture eventually growing group a strep-at that admission underwent irrigation and debridement of a large left forearm abscess 1.5 X10 X 13 cm-was seen by Dr. Frazier Butt and was placed on antibiotics but left AMA 8/19  Patient was found outside on the sidewalk by EMS have been feeling poorly and also was caught shoplifting Patient was transiently readmitted 8/31-I had a detailed discussion with him as an admitting physician regarding staying in the hospital to get treatment for his hand as he did have cellulitis on the follow-up x-ray but I think that was reactive-I discussed with him that he will need several days of IV antibiotics, dalbavancin was considered patient stated that he would be staying in the hospital I subsequently received a message from nursing staff that patient had ripped out the IV refused to sign AMA paperwork and left AGAINST MEDICAL ADVICE.  I would be cautious about readmitting this patient as it is not clear he needs inpatient antibiotics and he is occupying a much-needed bed which could be used for patients who are truly more  sick -unless he has clear-cut signs and signs and symptoms of overt sepsis and not just reactive changes-this can probably be managed with doxycycline he can continue to follow this in the outpatient setting   Discharge Exam: Filed Weights   05/23/22 1110  Weight: 81 kg   Not examined on discharge left AGAINST MEDICAL ADVICE however I did examine him earlier in the day and the wound seemed a little bit warm but there was no overt drainage and it seems to have been healing fairly well  Condition at discharge: poor  The results of significant diagnostics from this hospitalization (including imaging, microbiology, ancillary and laboratory) are listed below for reference.   Imaging Studies: CT FOREARM LEFT W CONTRAST  Result Date: 05/22/2022 CLINICAL DATA:  Soft tissue mass forearm. EXAM: CT OF THE UPPER LEFT EXTREMITY WITH CONTRAST TECHNIQUE: Multidetector CT imaging of the upper left extremity was performed according to the standard protocol following intravenous contrast administration. RADIATION DOSE REDUCTION: This exam was performed according to the departmental dose-optimization program which includes automated exposure control, adjustment of the mA and/or kV according to patient size and/or use of iterative reconstruction technique. CONTRAST:  OMNIPAQUE IOHEXOL 300 MG/ML  SOLN COMPARISON:  None Available. FINDINGS: Bones/Joint/Cartilage No cortical erosion or periosteal reaction. No fracture or dislocation. Normal alignment. No joint effusion. Ligaments Ligaments are suboptimally evaluated by CT. Muscles and Tendons Muscles are normal in bulk. No intramuscular hematoma or fluid collection. Tendons of the flexor and extensor compartment are intact. Soft tissue Skin irregularity about the medial aspect of the forearm with adjacent skin thickening and subcutaneous soft tissue swelling suggesting cellulitis. No fluid collection or abscess.  No soft tissue mass. IMPRESSION: 1. Skin thickening  and subcutaneous soft tissue edema with a skin irregularity about the medial aspect of the forearm suggesting cellulitis with open wound. No evidence of collection or abscess. 2. Muscles are normal in bulk. No intramuscular hematoma. No appreciable soft tissue mass. 3.  No acute osseous abnormality. Electronically Signed   By: Larose Hires D.O.   On: 05/22/2022 21:54   MR FOREARM LEFT W WO CONTRAST  Result Date: 05/10/2022 CLINICAL DATA:  Soft tissue mass, forearm, deep EXAM: MRI OF THE LEFT FOREARM WITHOUT AND WITH CONTRAST TECHNIQUE: Multiplanar, multisequence MR imaging of the left forearm was performed before and after the administration of intravenous contrast. CONTRAST:  7.69mL GADAVIST GADOBUTROL 1 MMOL/ML IV SOLN COMPARISON:  Forearm radiograph 05/09/2022. FINDINGS: Bones/Joint/Cartilage No significant marrow signal alteration. The cortex is intact. There is partially visualized wrist and elbow arthritis. There is a trace elbow joint effusion. Muscles and Tendons There is intramuscular edema and enhancement within the medial aspect of the flexor compartment and in the proximal extensor compartment. There is no intramuscular collection. Soft tissues There is extensive skin thickening and subcutaneous soft tissue swelling throughout the forearm. There is a lobular fluid collection coursing halfway circumferentially around the medial forearm from anterior to posterior, circumferentially measuring up to 10.3 cm and up to 1.4 cm in width (axial postcontrast image 20). This measures approximately 13.5 cm from proximal to distal (series 16, image 16). No susceptibility artifact to suggest foreign body or soft tissue gas. IMPRESSION: Extensive cellulitis of the left forearm, with large subcutaneous abscess measuring up to 1.5 cm short axis, circumferentially 10.3 cm around the medial forearm from anterior-posterior, and 13.5 cm in proximal-distal extent. Adjacent reactive or infectious myositis in the medial  flexor and extensor compartments. No intramuscular abscess. No signs of soft tissue gas by MRI. No evidence of osteomyelitis. Electronically Signed   By: Caprice Renshaw M.D.   On: 05/10/2022 13:47   DG Forearm Left  Result Date: 05/09/2022 CLINICAL DATA:  Cellulitis.  Evaluate for air forming organism. EXAM: LEFT FOREARM - 2 VIEW COMPARISON:  None Available. FINDINGS: There is diffuse soft tissue swelling of the arm. No evidence for foreign body or focal soft tissue gas collection. The osseous structures are within normal limits. No fracture or dislocation. IMPRESSION: 1. Diffuse soft tissue swelling. 2. No acute bony abnormality. Electronically Signed   By: Darliss Cheney M.D.   On: 05/09/2022 22:27   DG Humerus Left  Result Date: 05/09/2022 CLINICAL DATA:  Cellulitis.  Evaluate for gas forming organism. EXAM: LEFT HUMERUS - 2+ VIEW COMPARISON:  None Available. FINDINGS: There is no evidence of fracture or other focal bone lesions. There is soft tissue swelling of the arm. No evidence for soft tissue gas or foreign body. IMPRESSION: 1. Soft tissue swelling of the arm. 2. No acute bony abnormality. Electronically Signed   By: Darliss Cheney M.D.   On: 05/09/2022 22:26    Microbiology: Results for orders placed or performed during the hospital encounter of 05/22/22  Blood culture (routine x 2)     Status: None (Preliminary result)   Collection Time: 05/22/22 10:50 PM   Specimen: BLOOD LEFT FOREARM  Result Value Ref Range Status   Specimen Description BLOOD LEFT FOREARM  Final   Special Requests   Final    BOTTLES DRAWN AEROBIC AND ANAEROBIC Blood Culture adequate volume   Culture   Final    NO GROWTH < 12 HOURS Performed at [facility]  [facility], [address] [place], [place] [address]    Report Status PENDING  Incomplete  Blood culture (routine x 2)     Status: None (Preliminary result)   Collection Time: 05/23/22 12:25 AM   Specimen: BLOOD  Result Value Ref Range Status   Specimen Description  BLOOD RIGHT ANTECUBITAL  Final   Special Requests   Final    BOTTLES DRAWN AEROBIC AND ANAEROBIC Blood Culture results may not be optimal due to an inadequate volume of blood received in culture bottles   Culture   Final    NO GROWTH < 12 HOURS Performed at [facility], [address], [place], [place] [address]    Report Status PENDING  Incomplete    Labs: CBC: Recent Labs  Lab 05/22/22 2019 05/23/22 0400  WBC 5.7 6.6  NEUTROABS 3.9  --   HGB 9.1* 9.0*  HCT 26.6* 26.6*  MCV 73.9* 73.1*  PLT 314 323   Basic Metabolic Panel: Recent Labs  Lab 05/22/22 2019 05/23/22 0400  NA 137 137  K 3.7 4.0  CL 108 107  CO2 25 26  GLUCOSE 88 90  BUN 15 12  CREATININE 0.80 0.82  CALCIUM 8.7* 8.6*   Liver Function Tests: No results for input(s): "AST", "ALT", "ALKPHOS", "BILITOT", "PROT", "ALBUMIN" in the last 168 hours. CBG: No results for input(s): "GLUCAP" in the last 168 hours.  Discharge time spent: less than 30 minutes.  Signed: Rhetta Mura, MD [facility] 05/23/2022

## 2022-05-23 NOTE — Progress Notes (Addendum)
Patient seen and examined after midnight  Patient well-known to this examiner left AGAINST MEDICAL ADVICE hospital visit 817 through 819 with culture growing group A strep Continued heroin use also caught shoplifting  Last admission Abscess to left arm and completion of surgery did not complete antibiotics Readmitted as "not feeling well"  White count 6, hemoglobin 9.0 MCV 73 platelet 323 BUNs/creatinine 12/0.8 BCx 2 reordered CT forearm with contrast shows skin thickening and subcutaneous tissue edema with skin irregularity around medial aspect suggesting cellulitis no abscess  I personally examined the wound and it looks like although he has some redness around the area, it seems to be healing up fairly nicely with good approximation of skin I did not notice any pus or purulence I discussed the case with Dr. Frazier Butt who only recommends local wound care as needed Does not have any operative options planned  Patient has elected to stay in the hospital-allow diet, we will discontinue dalbavancin-he will continue vancomycin and ceftriaxone at least 4-day and then discharge home in the next several days on continued Keflex  Tristan Koch, MD [facility] 9:36 AM

## 2022-05-23 NOTE — ED Notes (Signed)
Patient continues to remove monitoring devices after being educated the need to keep them on. Vitals will be updated when possible.

## 2022-05-23 NOTE — ED Notes (Signed)
Patient requesting water informed he is NPO at this time.  Also requesting meds for anxiety.  MD secured chatted.  Awaiting response Patient wanting to make a phone call so phone placed in room.

## 2022-05-23 NOTE — ED Notes (Signed)
Patient refusing to keep monitoring equipment on

## 2022-05-27 LAB — CULTURE, BLOOD (ROUTINE X 2)
Culture: NO GROWTH
Special Requests: ADEQUATE

## 2022-05-28 LAB — CULTURE, BLOOD (ROUTINE X 2): Culture: NO GROWTH

## 2022-06-26 ENCOUNTER — Encounter (HOSPITAL_COMMUNITY): Payer: Self-pay

## 2022-06-26 ENCOUNTER — Other Ambulatory Visit: Payer: Self-pay

## 2022-06-26 ENCOUNTER — Emergency Department (HOSPITAL_COMMUNITY)
Admission: EM | Admit: 2022-06-26 | Discharge: 2022-06-26 | Disposition: A | Discharge status: Home / Self Care | Payer: PRIVATE HEALTH INSURANCE | Attending: Emergency Medicine | Admitting: Emergency Medicine

## 2022-06-26 DIAGNOSIS — F1910 Other psychoactive substance abuse, uncomplicated: Secondary | ICD-10-CM

## 2022-06-26 DIAGNOSIS — F1110 Opioid abuse, uncomplicated: Secondary | ICD-10-CM | POA: Insufficient documentation

## 2022-06-26 DIAGNOSIS — L539 Erythematous condition, unspecified: Secondary | ICD-10-CM | POA: Insufficient documentation

## 2022-06-26 DIAGNOSIS — M79632 Pain in left forearm: Secondary | ICD-10-CM | POA: Diagnosis not present

## 2022-06-26 LAB — CBC WITH DIFFERENTIAL/PLATELET
Abs Immature Granulocytes: 0.01 10*3/uL (ref 0.00–0.07)
Basophils Absolute: 0 10*3/uL (ref 0.0–0.1)
Basophils Relative: 0 %
Eosinophils Absolute: 0.1 10*3/uL (ref 0.0–0.5)
Eosinophils Relative: 2 %
HCT: 30.3 % — ABNORMAL LOW (ref 39.0–52.0)
Hemoglobin: 10.1 g/dL — ABNORMAL LOW (ref 13.0–17.0)
Immature Granulocytes: 0 %
Lymphocytes Relative: 23 %
Lymphs Abs: 1.6 10*3/uL (ref 0.7–4.0)
MCH: 25.1 pg — ABNORMAL LOW (ref 26.0–34.0)
MCHC: 33.3 g/dL (ref 30.0–36.0)
MCV: 75.2 fL — ABNORMAL LOW (ref 80.0–100.0)
Monocytes Absolute: 0.5 10*3/uL (ref 0.1–1.0)
Monocytes Relative: 7 %
Neutro Abs: 4.6 10*3/uL (ref 1.7–7.7)
Neutrophils Relative %: 68 %
Platelets: 269 10*3/uL (ref 150–400)
RBC: 4.03 MIL/uL — ABNORMAL LOW (ref 4.22–5.81)
RDW: 16.4 % — ABNORMAL HIGH (ref 11.5–15.5)
WBC: 6.8 10*3/uL (ref 4.0–10.5)
nRBC: 0 % (ref 0.0–0.2)

## 2022-06-26 LAB — COMPREHENSIVE METABOLIC PANEL
ALT: 14 U/L (ref 0–44)
AST: 20 U/L (ref 15–41)
Albumin: 3.5 g/dL (ref 3.5–5.0)
Alkaline Phosphatase: 61 U/L (ref 38–126)
Anion gap: 6 (ref 5–15)
BUN: 18 mg/dL (ref 6–20)
CO2: 27 mmol/L (ref 22–32)
Calcium: 9.1 mg/dL (ref 8.9–10.3)
Chloride: 102 mmol/L (ref 98–111)
Creatinine, Ser: 0.85 mg/dL (ref 0.61–1.24)
GFR, Estimated: >60 mL/min (ref >60)
Glucose, Bld: 96 mg/dL (ref 70–99)
Potassium: 3.7 mmol/L (ref 3.5–5.1)
Sodium: 135 mmol/L (ref 135–145)
Total Bilirubin: 0.4 mg/dL (ref 0.3–1.2)
Total Protein: 7.2 g/dL (ref 6.5–8.1)

## 2022-06-26 MED ORDER — NALOXONE HCL 0.4 MG/ML IJ SOLN
0.4000 mg | Freq: Once | INTRAMUSCULAR | Status: DC
  Filled 2022-06-26: qty 1

## 2022-06-26 MED ORDER — NALOXONE HCL 0.4 MG/ML IJ SOLN: 0.4000 mg | INTRAMUSCULAR | Status: DC | PRN

## 2022-06-26 NOTE — ED Provider Notes (Signed)
[facility] DEPT Provider Note   CSN: [id number] Arrival date & time: 06/26/22  0636     History  Chief Complaint  Patient presents with   Addiction Problem    Tristan [name] is a 36 y.o. male.  36 y.o male with a PMH of Polysubstance abuse, HCV, presents to the ED with a chief complaint of left forearm infiltration. Patient was reportedly under police custody, when the call was placed for a possible overdose. Patient states he had problems with his left forearm, has been hearing and seeing voices.  He is unable to provide much history as he under the side effects of heroin.  He does report injecting his left forearm.  Patient was not given Narcan by EMS he is mentating okay on arrival with stable vital signs.  Attempted to give patient Narcan at the bedside, however patient reports "I do not like it how that makes me feel ", do not give it to me.  Narcan was not given at this time, he is not endorsing any other pain.  No chest pain, no shortness of breath.  The history is provided by the patient and the EMS personnel.       Home Medications Prior to Admission medications   Medication Sig Start Date End Date Taking? Authorizing Provider  escitalopram (LEXAPRO) 10 MG tablet Take 1 tablet (10 mg total) by mouth daily. Patient not taking: Reported on 05/23/2022 02/14/22 06/29/22  Harlow Asa, MD  folic acid (FOLVITE) 1 MG tablet Take 1 tablet (1 mg total) by mouth daily. Patient not taking: Reported on 02/27/2022 02/23/22   Flora Lipps, MD  gabapentin (NEURONTIN) 100 MG capsule Take 1 capsule (100 mg total) by mouth 3 (three) times daily. Patient not taking: Reported on 05/23/2022 02/14/22 06/29/22  Harlow Asa, MD  hydrOXYzine (ATARAX) 25 MG tablet Take 1 tablet (25 mg total) by mouth every 6 (six) hours as needed for anxiety. Patient not taking: Reported on 02/27/2022 02/22/22   Flora Lipps, MD  loperamide (IMODIUM) 2 MG capsule Take 1  capsule (2 mg total) by mouth as needed for diarrhea or loose stools (diarrhea). Patient not taking: Reported on 02/27/2022 02/22/22   Flora Lipps, MD  methocarbamol (ROBAXIN) 500 MG tablet Take 1 tablet (500 mg total) by mouth every 8 (eight) hours as needed for muscle spasms. Patient not taking: Reported on 02/27/2022 02/22/22   Flora Lipps, MD  Multiple Vitamin (MULTIVITAMIN WITH MINERALS) TABS tablet Take 1 tablet by mouth daily. Patient not taking: Reported on 02/27/2022 02/23/22   Flora Lipps, MD  naproxen (NAPROSYN) 500 MG tablet Take 1 tablet (500 mg total) by mouth 2 (two) times daily as needed (aching, pain, or discomfort). Patient not taking: Reported on 02/27/2022 02/22/22   Flora Lipps, MD  pantoprazole (PROTONIX) 40 MG tablet Take 1 tablet (40 mg total) by mouth daily. Patient not taking: Reported on 05/10/2022 01/23/22   Armando Reichert, MD  QUEtiapine (SEROQUEL) 100 MG tablet Take 1 tablet (100 mg total) by mouth at bedtime. Patient not taking: Reported on 05/23/2022 02/14/22 06/29/22  Harlow Asa, MD  thiamine 100 MG tablet Take 1 tablet (100 mg total) by mouth daily. Patient not taking: Reported on 05/23/2022 02/23/22   Flora Lipps, MD      Allergies    Patient has no known allergies.    Review of Systems   Review of Systems  Constitutional:  Negative for fever.  Respiratory:  Negative for shortness of  breath.   Cardiovascular:  Negative for chest pain.  Gastrointestinal:  Negative for abdominal pain, diarrhea and vomiting.  Genitourinary:  Negative for flank pain.  Musculoskeletal:  Positive for myalgias.  Skin:  Negative for wound.  Neurological:  Negative for facial asymmetry.  All other systems reviewed and are negative.   Physical Exam Updated Vital Signs BP 130/85   Pulse 71   Temp 98.5 F (36.9 C)   Resp 20   SpO2 98%  Physical Exam Vitals and nursing note reviewed.  Constitutional:      Comments: Under the influence of drugs  HENT:     Head:  Normocephalic and atraumatic.     Nose: Nose normal.  Eyes:     Pupils: Pupils are equal, round, and reactive to light.     Comments: Pupils are pinpoint.   Cardiovascular:     Rate and Rhythm: Normal rate.     Pulses:          Radial pulses are 2+ on the right side and 2+ on the left side.  Pulmonary:     Effort: Pulmonary effort is normal.  Abdominal:     General: Abdomen is flat.     Tenderness: There is no abdominal tenderness.  Musculoskeletal:     Left forearm: No swelling, edema, deformity, lacerations, tenderness or bony tenderness.       Arms:     Cervical back: Normal range of motion and neck supple.  Skin:    General: Skin is warm and dry.     Findings: Erythema present.  Neurological:     Mental Status: He is alert and oriented to person, place, and time.     ED Results / Procedures / Treatments   Labs (all labs ordered are listed, but only abnormal results are displayed) Labs Reviewed  CBC WITH DIFFERENTIAL/PLATELET - Abnormal; Notable for the following components:      Result Value   RBC 4.03 (*)    Hemoglobin 10.1 (*)    HCT 30.3 (*)    MCV 75.2 (*)    MCH 25.1 (*)    RDW 16.4 (*)    All other components within normal limits  COMPREHENSIVE METABOLIC PANEL  RAPID URINE DRUG SCREEN, HOSP PERFORMED    EKG None  Radiology No results found.  Procedures Procedures    Medications Ordered in ED Medications  naloxone (NARCAN) injection 0.4 mg (has no administration in time range)    ED Course/ Medical Decision Making/ A&P                           Medical Decision Making Amount and/or Complexity of Data Reviewed Labs: ordered.  Risk Prescription drug management.      Problem List / ED Course:  Patient here complaining of left forearm pain was under police custody when EMS was called for a heroin overdose.  Attempted to give patient Narcan however vital signs are stable, he is mentating well.  Wound does not appear to be infected without  any drainage, he is afebrile. Tensive review of prior records does show the patient was admitted in the month of August for a cellulitis versus myositis, given IV antibiotics however left the hospital [place].  Patient was admitted a second time at the end of August, also left [place] from the emergency department prior to going up to the floor.  Today's visit, patient states that his left forearm feels somewhat  swollen and red however he has injected heroin into the forearm once again.  Narcan was not given to patient, will check screening labs to rule out any infection. Patient was never given any Narcan.  Labs were obtained in order to rule out any further infection.  CMP is within normal limits.  CBC with no white count, his wound is overall well-appearing.  Patient has metabolize in the ED for approximately 4-1/2 hours, he never received any Narcan.  I did discuss with patient outpatient resources.  However, patient is very well-known to our emergency department.  I do feel the patient is appropriate for disposition back home.  Patient hemodynamically stable for discharge.   Dispostion:  After consideration of the diagnostic results and the patients response to treatment, I feel that the patent would benefit from stopping polysubstance abuse.     Portions of this note were generated with [employer]. Dictation errors may occur despite best attempts at proofreading.   Final Clinical Impression(s) / ED Diagnoses Final diagnoses:  Polysubstance abuse ([place])  Heroin abuse [facility])    Rx / DC Orders ED Discharge Orders     None         Janeece Fitting, PA-C 06/26/22 1106    Blanchie Dessert, MD 06/30/22 1215

## 2022-06-26 NOTE — ED Triage Notes (Signed)
Patient brought in by EMS due to heroin use. Reporting left arm pain from possible IV infiltration

## 2022-07-20 ENCOUNTER — Encounter (HOSPITAL_COMMUNITY): Payer: Self-pay

## 2022-07-20 ENCOUNTER — Emergency Department (HOSPITAL_COMMUNITY)
Admission: EM | Admit: 2022-07-20 | Discharge: 2022-07-20 | Disposition: A | Discharge status: Home / Self Care | Payer: PRIVATE HEALTH INSURANCE | Attending: Emergency Medicine | Admitting: Emergency Medicine

## 2022-07-20 ENCOUNTER — Other Ambulatory Visit: Payer: Self-pay

## 2022-07-20 DIAGNOSIS — I10 Essential (primary) hypertension: Secondary | ICD-10-CM | POA: Diagnosis not present

## 2022-07-20 DIAGNOSIS — Z0279 Encounter for issue of other medical certificate: Secondary | ICD-10-CM | POA: Diagnosis present

## 2022-07-20 DIAGNOSIS — Z008 Encounter for other general examination: Secondary | ICD-10-CM

## 2022-07-20 DIAGNOSIS — Z79899 Other long term (current) drug therapy: Secondary | ICD-10-CM | POA: Insufficient documentation

## 2022-07-20 DIAGNOSIS — Z20822 Contact with and (suspected) exposure to covid-19: Secondary | ICD-10-CM | POA: Diagnosis not present

## 2022-07-20 LAB — CBC WITH DIFFERENTIAL/PLATELET
Abs Immature Granulocytes: 0.02 10*3/uL (ref 0.00–0.07)
Basophils Absolute: 0 10*3/uL (ref 0.0–0.1)
Basophils Relative: 0 %
Eosinophils Absolute: 0.1 10*3/uL (ref 0.0–0.5)
Eosinophils Relative: 2 %
HCT: 36.8 % — ABNORMAL LOW (ref 39.0–52.0)
Hemoglobin: 12.3 g/dL — ABNORMAL LOW (ref 13.0–17.0)
Immature Granulocytes: 0 %
Lymphocytes Relative: 21 %
Lymphs Abs: 1.7 10*3/uL (ref 0.7–4.0)
MCH: 24.8 pg — ABNORMAL LOW (ref 26.0–34.0)
MCHC: 33.4 g/dL (ref 30.0–36.0)
MCV: 74.3 fL — ABNORMAL LOW (ref 80.0–100.0)
Monocytes Absolute: 0.4 10*3/uL (ref 0.1–1.0)
Monocytes Relative: 5 %
Neutro Abs: 6 10*3/uL (ref 1.7–7.7)
Neutrophils Relative %: 72 %
Platelets: 325 10*3/uL (ref 150–400)
RBC: 4.95 MIL/uL (ref 4.22–5.81)
RDW: 15.6 % — ABNORMAL HIGH (ref 11.5–15.5)
WBC: 8.3 10*3/uL (ref 4.0–10.5)
nRBC: 0 % (ref 0.0–0.2)

## 2022-07-20 LAB — COMPREHENSIVE METABOLIC PANEL
ALT: 43 U/L (ref 0–44)
AST: 39 U/L (ref 15–41)
Albumin: 3.8 g/dL (ref 3.5–5.0)
Alkaline Phosphatase: 61 U/L (ref 38–126)
Anion gap: 6 (ref 5–15)
BUN: 15 mg/dL (ref 6–20)
CO2: 26 mmol/L (ref 22–32)
Calcium: 9.5 mg/dL (ref 8.9–10.3)
Chloride: 105 mmol/L (ref 98–111)
Creatinine, Ser: 0.97 mg/dL (ref 0.61–1.24)
GFR, Estimated: >60 mL/min (ref >60)
Glucose, Bld: 99 mg/dL (ref 70–99)
Potassium: 4.1 mmol/L (ref 3.5–5.1)
Sodium: 137 mmol/L (ref 135–145)
Total Bilirubin: 0.5 mg/dL (ref 0.3–1.2)
Total Protein: 8.1 g/dL (ref 6.5–8.1)

## 2022-07-20 LAB — RAPID URINE DRUG SCREEN, HOSP PERFORMED
Amphetamines: POSITIVE — AB
Barbiturates: NOT DETECTED
Benzodiazepines: NOT DETECTED
Cocaine: POSITIVE — AB
Opiates: POSITIVE — AB
Tetrahydrocannabinol: NOT DETECTED

## 2022-07-20 LAB — RESP PANEL BY RT-PCR (FLU A&B, COVID) ARPGX2
Influenza A by PCR: NEGATIVE
Influenza B by PCR: NEGATIVE
SARS Coronavirus 2 by RT PCR: NEGATIVE

## 2022-07-20 LAB — SALICYLATE LEVEL: Salicylate Lvl: <7 mg/dL — ABNORMAL LOW (ref 7.0–30.0)

## 2022-07-20 LAB — ETHANOL: Alcohol, Ethyl (B): <10 mg/dL (ref <10)

## 2022-07-20 LAB — ACETAMINOPHEN LEVEL: Acetaminophen (Tylenol), Serum: <10 ug/mL — ABNORMAL LOW (ref 10–30)

## 2022-07-20 NOTE — Discharge Instructions (Signed)
You have been medically cleared to go to jail.  Please return with any worsening or concerning symptoms.

## 2022-07-20 NOTE — ED Triage Notes (Addendum)
Patient BIB [facility] after running a mile from the police after committing a crime. Used meth laced with fentanyl before the run. Patient is in police custody.

## 2022-07-20 NOTE — ED Provider Notes (Addendum)
[place] DEPT Provider Note   CSN: [id number] Arrival date & time: 07/20/22  1817     History Past medical history of polysubstance use disorder, bipolar 1 disorder, hepatitis C, hypertension, PTSD  Chief Complaint  Patient presents with   Medical Clearance    Tristan [name] is a 36 y.o. male.  Patient reports that earlier today he took some ice and fentanyl that he injected.  He says that he noticed that someone was following him but he did not know that it was police.  So he ran a mile until the police arrested him a crime that he had done a couple of days ago.  He reports no pain.  He does feel like he is thirsty and hungry.  No other complaints.  He is here with [facility] and here for medical clearance.  HPI     Home Medications Prior to Admission medications   Medication Sig Start Date End Date Taking? Authorizing Provider  escitalopram (LEXAPRO) 10 MG tablet Take 1 tablet (10 mg total) by mouth daily. Patient not taking: Reported on 05/23/2022 02/14/22 06/29/22  Harlow Asa, MD  folic acid (FOLVITE) 1 MG tablet Take 1 tablet (1 mg total) by mouth daily. Patient not taking: Reported on 02/27/2022 02/23/22   Flora Lipps, MD  gabapentin (NEURONTIN) 100 MG capsule Take 1 capsule (100 mg total) by mouth 3 (three) times daily. Patient not taking: Reported on 05/23/2022 02/14/22 06/29/22  Harlow Asa, MD  hydrOXYzine (ATARAX) 25 MG tablet Take 1 tablet (25 mg total) by mouth every 6 (six) hours as needed for anxiety. Patient not taking: Reported on 02/27/2022 02/22/22   Flora Lipps, MD  loperamide (IMODIUM) 2 MG capsule Take 1 capsule (2 mg total) by mouth as needed for diarrhea or loose stools (diarrhea). Patient not taking: Reported on 02/27/2022 02/22/22   Flora Lipps, MD  methocarbamol (ROBAXIN) 500 MG tablet Take 1 tablet (500 mg total) by mouth every 8 (eight) hours as needed for muscle spasms. Patient not taking:  Reported on 02/27/2022 02/22/22   Flora Lipps, MD  Multiple Vitamin (MULTIVITAMIN WITH MINERALS) TABS tablet Take 1 tablet by mouth daily. Patient not taking: Reported on 02/27/2022 02/23/22   Flora Lipps, MD  naproxen (NAPROSYN) 500 MG tablet Take 1 tablet (500 mg total) by mouth 2 (two) times daily as needed (aching, pain, or discomfort). Patient not taking: Reported on 02/27/2022 02/22/22   Flora Lipps, MD  pantoprazole (PROTONIX) 40 MG tablet Take 1 tablet (40 mg total) by mouth daily. Patient not taking: Reported on 05/10/2022 01/23/22   Armando Reichert, MD  QUEtiapine (SEROQUEL) 100 MG tablet Take 1 tablet (100 mg total) by mouth at bedtime. Patient not taking: Reported on 05/23/2022 02/14/22 06/29/22  Harlow Asa, MD  thiamine 100 MG tablet Take 1 tablet (100 mg total) by mouth daily. Patient not taking: Reported on 05/23/2022 02/23/22   Flora Lipps, MD      Allergies    Patient has no known allergies.    Review of Systems   Review of Systems  All other systems reviewed and are negative.   Physical Exam Updated Vital Signs BP 112/79   Pulse 83   Temp 97.7 F (36.5 C) (Oral)   Resp 15   SpO2 92%  Physical Exam Vitals and nursing note reviewed.  Constitutional:      General: He is not in acute distress.    Appearance: Normal appearance. He is well-developed. He is  not ill-appearing, toxic-appearing or diaphoretic.  HENT:     Head: Normocephalic and atraumatic.     Nose: No nasal deformity.     Mouth/Throat:     Lips: Pink. No lesions.  Eyes:     General: Gaze aligned appropriately. No scleral icterus.       Right eye: No discharge.        Left eye: No discharge.     Conjunctiva/sclera: Conjunctivae normal.     Right eye: Right conjunctiva is not injected. No exudate or hemorrhage.    Left eye: Left conjunctiva is not injected. No exudate or hemorrhage. Pulmonary:     Effort: Pulmonary effort is normal. No respiratory distress.  Skin:    General: Skin is warm and  dry.     Comments: Surgical scar on left forearm from recent surgery for abscess. Track marks noted.   Neurological:     Mental Status: He is alert and oriented to person, place, and time.  Psychiatric:        Mood and Affect: Mood normal.        Speech: Speech normal.        Behavior: Behavior normal. Behavior is cooperative.     ED Results / Procedures / Treatments   Labs (all labs ordered are listed, but only abnormal results are displayed) Labs Reviewed  RAPID URINE DRUG SCREEN, HOSP PERFORMED - Abnormal; Notable for the following components:      Result Value   Opiates POSITIVE (*)    Cocaine POSITIVE (*)    Amphetamines POSITIVE (*)    All other components within normal limits  CBC WITH DIFFERENTIAL/PLATELET - Abnormal; Notable for the following components:   Hemoglobin 12.3 (*)    HCT 36.8 (*)    MCV 74.3 (*)    MCH 24.8 (*)    RDW 15.6 (*)    All other components within normal limits  ACETAMINOPHEN LEVEL - Abnormal; Notable for the following components:   Acetaminophen (Tylenol), Serum <10 (*)    All other components within normal limits  SALICYLATE LEVEL - Abnormal; Notable for the following components:   Salicylate Lvl <7.0 (*)    All other components within normal limits  RESP PANEL BY RT-PCR (FLU A&B, COVID) ARPGX2  COMPREHENSIVE METABOLIC PANEL  ETHANOL    EKG None  Radiology No results found.  Procedures Procedures   Medications Ordered in ED Medications - No data to display  ED Course/ Medical Decision Making/ A&P                           Medical Decision Making Amount and/or Complexity of Data Reviewed Labs: ordered.   Patient here for medical clearance after being arrested. He admits to taking fentanyl and "ice" IV earlier today. He has no other complaints. Exam unremarkable. Medical clearance labs were obtained and he has no concerning findings on labs. He is positive for opiates, cocaine, and amphetamines which was expected. Negative  ETOH. He has no signs of respiratory depression or need for narcan or any other reversal. He will continue to metabolize substances but clinically does not require any further interventions. He is medically cleared for jail.   Final Clinical Impression(s) / ED Diagnoses Final diagnoses:  Medical clearance for incarceration    Rx / DC Orders ED Discharge Orders     None         Claudie Leach, PA-C 07/20/22 2102    Victorino Dike, Delorise Shiner  C, PA-C 07/20/22 2103    Charlynne Pander, MD 07/20/22 [phone]

## 2022-09-25 ENCOUNTER — Ambulatory Visit
Admission: EM | Admit: 2022-09-25 | Discharge: 2022-09-25 | Discharge status: Against Medical Advice | Payer: PRIVATE HEALTH INSURANCE

## 2022-09-29 ENCOUNTER — Emergency Department (HOSPITAL_COMMUNITY): Payer: PRIVATE HEALTH INSURANCE

## 2022-09-29 ENCOUNTER — Other Ambulatory Visit: Payer: Self-pay

## 2022-09-29 ENCOUNTER — Emergency Department (HOSPITAL_COMMUNITY)
Admission: EM | Admit: 2022-09-29 | Discharge: 2022-09-29 | Discharge status: Against Medical Advice | Payer: PRIVATE HEALTH INSURANCE | Attending: Emergency Medicine | Admitting: Emergency Medicine

## 2022-09-29 DIAGNOSIS — R079 Chest pain, unspecified: Secondary | ICD-10-CM | POA: Diagnosis present

## 2022-09-29 DIAGNOSIS — F1210 Cannabis abuse, uncomplicated: Secondary | ICD-10-CM | POA: Diagnosis not present

## 2022-09-29 DIAGNOSIS — F1410 Cocaine abuse, uncomplicated: Secondary | ICD-10-CM | POA: Diagnosis not present

## 2022-09-29 DIAGNOSIS — R0602 Shortness of breath: Secondary | ICD-10-CM | POA: Diagnosis not present

## 2022-09-29 DIAGNOSIS — Z5321 Procedure and treatment not carried out due to patient leaving prior to being seen by health care provider: Secondary | ICD-10-CM | POA: Insufficient documentation

## 2022-09-29 DIAGNOSIS — F1510 Other stimulant abuse, uncomplicated: Secondary | ICD-10-CM | POA: Insufficient documentation

## 2022-09-29 LAB — COMPREHENSIVE METABOLIC PANEL
ALT: 16 U/L (ref 0–44)
AST: 25 U/L (ref 15–41)
Albumin: 3.5 g/dL (ref 3.5–5.0)
Alkaline Phosphatase: 64 U/L (ref 38–126)
Anion gap: 11 (ref 5–15)
BUN: 16 mg/dL (ref 6–20)
CO2: 25 mmol/L (ref 22–32)
Calcium: 8.9 mg/dL (ref 8.9–10.3)
Chloride: 99 mmol/L (ref 98–111)
Creatinine, Ser: 0.92 mg/dL (ref 0.61–1.24)
GFR, Estimated: >60 mL/min (ref >60)
Glucose, Bld: 110 mg/dL — ABNORMAL HIGH (ref 70–99)
Potassium: 3.8 mmol/L (ref 3.5–5.1)
Sodium: 135 mmol/L (ref 135–145)
Total Bilirubin: 0.3 mg/dL (ref 0.3–1.2)
Total Protein: 6.9 g/dL (ref 6.5–8.1)

## 2022-09-29 LAB — RAPID URINE DRUG SCREEN, HOSP PERFORMED
Amphetamines: POSITIVE — AB
Barbiturates: NOT DETECTED
Benzodiazepines: NOT DETECTED
Cocaine: POSITIVE — AB
Opiates: NOT DETECTED
Tetrahydrocannabinol: POSITIVE — AB

## 2022-09-29 LAB — CBC
HCT: 28.9 % — ABNORMAL LOW (ref 39.0–52.0)
Hemoglobin: 9.9 g/dL — ABNORMAL LOW (ref 13.0–17.0)
MCH: 24.6 pg — ABNORMAL LOW (ref 26.0–34.0)
MCHC: 34.3 g/dL (ref 30.0–36.0)
MCV: 71.7 fL — ABNORMAL LOW (ref 80.0–100.0)
Platelets: 498 10*3/uL — ABNORMAL HIGH (ref 150–400)
RBC: 4.03 MIL/uL — ABNORMAL LOW (ref 4.22–5.81)
RDW: 15.4 % (ref 11.5–15.5)
WBC: 6.4 10*3/uL (ref 4.0–10.5)
nRBC: 0 % (ref 0.0–0.2)

## 2022-09-29 LAB — TROPONIN I (HIGH SENSITIVITY)
Troponin I (High Sensitivity): 3 ng/L (ref <18)
Troponin I (High Sensitivity): <2 ng/L (ref <18)

## 2022-09-29 LAB — SALICYLATE LEVEL: Salicylate Lvl: <7 mg/dL — ABNORMAL LOW (ref 7.0–30.0)

## 2022-09-29 LAB — LIPASE, BLOOD: Lipase: 21 U/L (ref 11–51)

## 2022-09-29 LAB — ACETAMINOPHEN LEVEL: Acetaminophen (Tylenol), Serum: <10 ug/mL — ABNORMAL LOW (ref 10–30)

## 2022-09-29 NOTE — ED Notes (Signed)
Pt called for lab draw X3 with no answer.

## 2022-09-29 NOTE — ED Provider Triage Note (Signed)
Emergency Medicine Provider Triage Evaluation Note  Tristan [name] , a 37 y.o. male  was evaluated in triage.  Pt complains of chest pain.  He is somewhat evasive with questioning and is not particularly interested in explaining his chest pain to me.  He is pleasant however.  He seems to have been brought into ER by EMS from a gas station with a complaint of chest pain.  Seems that it was central nonradiating and nonpleuritic however he is unwilling to answer additional questions.  He does say that he uses no recreational drugs but also adds that "drank a water bottle "it seems that on further questioning that he drank a water bottle that had already been opened.  He states "there might be something in my system"  He denies any leg pain or swelling.  Review of Systems  Positive: Chest pain Negative: Fever  Physical Exam  BP 129/72 (BP Location: Left Arm)   Pulse (!) 114   Temp 98.5 F (36.9 C) (Oral)   Resp 20   SpO2 96%  Gen:   Awake, no distress Resp:  Normal effort  MSK:   Moves extremities without difficulty  Other:  Somewhat strange affect but alert and oriented.  Medical Decision Making  Medically screening exam initiated at 4:03 AM.  Appropriate orders placed.  Tristan [name] was informed that the remainder of the evaluation will be completed by another provider, this initial triage assessment does not replace that evaluation, and the importance of remaining in the ED until their evaluation is complete.  Labs, EKG, urine, chest x-ray   Tristan [name], [place] 09/30/22 [phone]

## 2022-09-29 NOTE — ED Triage Notes (Signed)
Patient arrived with EMS from a gas station ( homeless ) reports central chest pain this evening , mild SOB , refused NTG sl by EMS .

## 2022-11-18 ENCOUNTER — Emergency Department (HOSPITAL_COMMUNITY)
Admission: EM | Admit: 2022-11-18 | Discharge: 2022-11-18 | Disposition: A | Discharge status: Home / Self Care | Payer: PRIVATE HEALTH INSURANCE | Attending: Emergency Medicine | Admitting: Emergency Medicine

## 2022-11-18 ENCOUNTER — Encounter (HOSPITAL_COMMUNITY): Payer: Self-pay

## 2022-11-18 ENCOUNTER — Other Ambulatory Visit: Payer: Self-pay

## 2022-11-18 DIAGNOSIS — R4182 Altered mental status, unspecified: Secondary | ICD-10-CM | POA: Insufficient documentation

## 2022-11-18 DIAGNOSIS — T50901A Poisoning by unspecified drugs, medicaments and biological substances, accidental (unintentional), initial encounter: Secondary | ICD-10-CM

## 2022-11-18 DIAGNOSIS — T40411A Poisoning by fentanyl or fentanyl analogs, accidental (unintentional), initial encounter: Secondary | ICD-10-CM | POA: Insufficient documentation

## 2022-11-18 NOTE — ED Triage Notes (Signed)
[facility] EMS and police from [place] for overdose. Pt was found in bathroom surrounded by needles, altered. Pt is responsive to auditory stimuli. Hx of overdose on meth and heroin.

## 2022-11-18 NOTE — ED Provider Notes (Signed)
[facility] Provider Note   CSN: [id number] Arrival date & time: 11/18/22  1634     History  Chief Complaint  Patient presents with   Drug Overdose    Tristan [name] is a 37 y.o. male present emergency department by EMS with concern for drug overdose.  History is provided by EMS as well as police were present as an escort.  Police were called onto the scene at a local [place] gas station as the patient was found unresponsive in the bathroom "surrounded by needles".  Patient reportedly had told EMS that he had used drugs earlier today.  He has a history of polysubstance use and opioid use disorder.  Also has a history of heroin overdose.  The patient was intermittently combative with EMS and police en route to the hospital, but otherwise somnolent.  No report of head injury.  HPI     Home Medications Prior to Admission medications   Medication Sig Start Date End Date Taking? Authorizing Provider  escitalopram (LEXAPRO) 10 MG tablet Take 1 tablet (10 mg total) by mouth daily. Patient not taking: Reported on 05/23/2022 02/14/22 06/29/22  Harlow Asa, MD  folic acid (FOLVITE) 1 MG tablet Take 1 tablet (1 mg total) by mouth daily. Patient not taking: Reported on 02/27/2022 02/23/22   Flora Lipps, MD  gabapentin (NEURONTIN) 100 MG capsule Take 1 capsule (100 mg total) by mouth 3 (three) times daily. Patient not taking: Reported on 05/23/2022 02/14/22 06/29/22  Harlow Asa, MD  hydrOXYzine (ATARAX) 25 MG tablet Take 1 tablet (25 mg total) by mouth every 6 (six) hours as needed for anxiety. Patient not taking: Reported on 02/27/2022 02/22/22   Flora Lipps, MD  loperamide (IMODIUM) 2 MG capsule Take 1 capsule (2 mg total) by mouth as needed for diarrhea or loose stools (diarrhea). Patient not taking: Reported on 02/27/2022 02/22/22   Flora Lipps, MD  methocarbamol (ROBAXIN) 500 MG tablet Take 1 tablet (500 mg total) by mouth every 8  (eight) hours as needed for muscle spasms. Patient not taking: Reported on 02/27/2022 02/22/22   Flora Lipps, MD  Multiple Vitamin (MULTIVITAMIN WITH MINERALS) TABS tablet Take 1 tablet by mouth daily. Patient not taking: Reported on 02/27/2022 02/23/22   Flora Lipps, MD  naproxen (NAPROSYN) 500 MG tablet Take 1 tablet (500 mg total) by mouth 2 (two) times daily as needed (aching, pain, or discomfort). Patient not taking: Reported on 02/27/2022 02/22/22   Flora Lipps, MD  pantoprazole (PROTONIX) 40 MG tablet Take 1 tablet (40 mg total) by mouth daily. Patient not taking: Reported on 05/10/2022 01/23/22   Armando Reichert, MD  QUEtiapine (SEROQUEL) 100 MG tablet Take 1 tablet (100 mg total) by mouth at bedtime. Patient not taking: Reported on 05/23/2022 02/14/22 06/29/22  Harlow Asa, MD  thiamine 100 MG tablet Take 1 tablet (100 mg total) by mouth daily. Patient not taking: Reported on 05/23/2022 02/23/22   Flora Lipps, MD      Allergies    Patient has no known allergies.    Review of Systems   Review of Systems  Physical Exam Updated Vital Signs BP 124/60   Pulse 99   Temp 98.6 F (37 C) (Oral)   Resp 18   Ht '5\' 10"'$  (1.778 m)   Wt 81 kg   SpO2 97%   BMI 25.62 kg/m  Physical Exam Constitutional:      Comments: Patient is groggy, would not answer questions,  cursing at me and throwing arms over his head.  HENT:     Head: Normocephalic.  Eyes:     Comments: Pupils are pinpoint bilaterally  Cardiovascular:     Rate and Rhythm: Normal rate and regular rhythm.  Pulmonary:     Effort: Pulmonary effort is normal. No respiratory distress.  Abdominal:     General: There is no distension.     Tenderness: There is no abdominal tenderness.  Skin:    General: Skin is warm and dry.     ED Results / Procedures / Treatments   Labs (all labs ordered are listed, but only abnormal results are displayed) Labs Reviewed - No data to display  EKG None  Radiology No results  found.  Procedures Procedures    Medications Ordered in ED Medications - No data to display  ED Course/ Medical Decision Making/ A&P Clinical Course as of 11/18/22 1949  Mon Nov 18, 2022  1759 Patient reassessed and is improving mental status.  He is able to answer short sentences but will not tell me what type of drug he was using in the bathroom today. [MT]  [id number] Patient is now drinking fluids and ambulating steadily.  He tells me he wants to leave.  He appears cognizant and coherent.  We discussed the risks of continued IV drug use and overdose and he understands this.  He is stable for discharge at this time [MT]    Clinical Course User Index [MT] [name], MD                             Medical Decision Making  Patient presents with altered mental status, pinpoint pupils, after being found in a gas station bathroom surrounded reportedly by needles.  He has a history of heroin and opioid use disorder and his clinical presentation is consistent with drug overdose.  However he is stable on arrival, not hypoxic, not hypopneic, not requiring intubation.  There is no indication for emergent x-ray imaging at this time.  I do not see evidence of head injury or suggestion of brain bleed.  Will continue to monitor the patient closely for improving sobriety.  Patient remains on oxygen monitoring and directly visualized by nursing staff.  Supplemental history is provided by EMS.  Medical chart review shows patient has a history of PTSD, hepatitis, bipolar 1 disorder, polysubstance use.  He was seen in the ER for fentanyl overdose in the past.  PDMP was reviewed and the patient does not have any recent narcotic or opioid prescriptions filed.  He was given 3 doses of buprenorphine several months ago.        Final Clinical Impression(s) / ED Diagnoses Final diagnoses:  Accidental overdose, initial encounter    Rx / DC Orders ED Discharge Orders     None          Ginny Loomer, Carola Rhine, MD 11/18/22 1949

## 2022-11-18 NOTE — ED Notes (Signed)
Pt left before receiving d/c instructions

## 2022-11-20 ENCOUNTER — Other Ambulatory Visit: Payer: Self-pay

## 2022-11-20 ENCOUNTER — Encounter (HOSPITAL_COMMUNITY): Payer: Self-pay

## 2022-11-20 ENCOUNTER — Emergency Department (HOSPITAL_COMMUNITY): Payer: Self-pay

## 2022-11-20 ENCOUNTER — Emergency Department (HOSPITAL_COMMUNITY)
Admission: EM | Admit: 2022-11-20 | Discharge: 2022-11-20 | Disposition: A | Discharge status: Home / Self Care | Payer: Self-pay | Attending: Emergency Medicine | Admitting: Emergency Medicine

## 2022-11-20 DIAGNOSIS — R0789 Other chest pain: Secondary | ICD-10-CM | POA: Insufficient documentation

## 2022-11-20 DIAGNOSIS — R079 Chest pain, unspecified: Secondary | ICD-10-CM

## 2022-11-20 LAB — CBC
HCT: 30.1 % — ABNORMAL LOW (ref 39.0–52.0)
Hemoglobin: 10.1 g/dL — ABNORMAL LOW (ref 13.0–17.0)
MCH: 25 pg — ABNORMAL LOW (ref 26.0–34.0)
MCHC: 33.6 g/dL (ref 30.0–36.0)
MCV: 74.5 fL — ABNORMAL LOW (ref 80.0–100.0)
Platelets: 318 10*3/uL (ref 150–400)
RBC: 4.04 MIL/uL — ABNORMAL LOW (ref 4.22–5.81)
RDW: 15.2 % (ref 11.5–15.5)
WBC: 6.5 10*3/uL (ref 4.0–10.5)
nRBC: 0 % (ref 0.0–0.2)

## 2022-11-20 LAB — BASIC METABOLIC PANEL
Anion gap: 7 (ref 5–15)
BUN: 13 mg/dL (ref 6–20)
CO2: 26 mmol/L (ref 22–32)
Calcium: 8.9 mg/dL (ref 8.9–10.3)
Chloride: 104 mmol/L (ref 98–111)
Creatinine, Ser: 0.81 mg/dL (ref 0.61–1.24)
GFR, Estimated: >60 mL/min (ref >60)
Glucose, Bld: 100 mg/dL — ABNORMAL HIGH (ref 70–99)
Potassium: 3.9 mmol/L (ref 3.5–5.1)
Sodium: 137 mmol/L (ref 135–145)

## 2022-11-20 LAB — TROPONIN I (HIGH SENSITIVITY): Troponin I (High Sensitivity): 2 ng/L (ref <18)

## 2022-11-20 NOTE — ED Provider Notes (Signed)
[facility] Provider Note   CSN: [id number] Arrival date & time: 11/20/22  1616     History  Chief Complaint  Patient presents with   Chest Pain    Tristan [name] is a 37 y.o. male with a past medical history of polysubstance use disorder presenting to the with [employer] for chest pain.  Is been going on for 4 days.  He has not taken anything for this.  Says that he occasionally has a cough but that is been going on for a long time.  No palpitations or shortness of breath.   Chest Pain      Home Medications Prior to Admission medications   Medication Sig Start Date End Date Taking? Authorizing Provider  escitalopram (LEXAPRO) 10 MG tablet Take 1 tablet (10 mg total) by mouth daily. Patient not taking: Reported on 05/23/2022 02/14/22 06/29/22  Harlow Asa, MD  folic acid (FOLVITE) 1 MG tablet Take 1 tablet (1 mg total) by mouth daily. Patient not taking: Reported on 02/27/2022 02/23/22   Flora Lipps, MD  gabapentin (NEURONTIN) 100 MG capsule Take 1 capsule (100 mg total) by mouth 3 (three) times daily. Patient not taking: Reported on 05/23/2022 02/14/22 06/29/22  Harlow Asa, MD  hydrOXYzine (ATARAX) 25 MG tablet Take 1 tablet (25 mg total) by mouth every 6 (six) hours as needed for anxiety. Patient not taking: Reported on 02/27/2022 02/22/22   Flora Lipps, MD  loperamide (IMODIUM) 2 MG capsule Take 1 capsule (2 mg total) by mouth as needed for diarrhea or loose stools (diarrhea). Patient not taking: Reported on 02/27/2022 02/22/22   Flora Lipps, MD  methocarbamol (ROBAXIN) 500 MG tablet Take 1 tablet (500 mg total) by mouth every 8 (eight) hours as needed for muscle spasms. Patient not taking: Reported on 02/27/2022 02/22/22   Flora Lipps, MD  Multiple Vitamin (MULTIVITAMIN WITH MINERALS) TABS tablet Take 1 tablet by mouth daily. Patient not taking: Reported on 02/27/2022 02/23/22   Flora Lipps, MD  naproxen (NAPROSYN) 500  MG tablet Take 1 tablet (500 mg total) by mouth 2 (two) times daily as needed (aching, pain, or discomfort). Patient not taking: Reported on 02/27/2022 02/22/22   Flora Lipps, MD  pantoprazole (PROTONIX) 40 MG tablet Take 1 tablet (40 mg total) by mouth daily. Patient not taking: Reported on 05/10/2022 01/23/22   Armando Reichert, MD  QUEtiapine (SEROQUEL) 100 MG tablet Take 1 tablet (100 mg total) by mouth at bedtime. Patient not taking: Reported on 05/23/2022 02/14/22 06/29/22  Harlow Asa, MD  thiamine 100 MG tablet Take 1 tablet (100 mg total) by mouth daily. Patient not taking: Reported on 05/23/2022 02/23/22   Flora Lipps, MD      Allergies    Patient has no known allergies.    Review of Systems   Review of Systems  Cardiovascular:  Positive for chest pain.    Physical Exam Updated Vital Signs BP (!) 140/81 (BP Location: Left Arm)   Pulse 75   Temp 98.3 F (36.8 C) (Oral)   Resp 16   Ht '5\' 10"'$  (1.778 m)   Wt 77.1 kg   SpO2 97%   BMI 24.39 kg/m  Physical Exam Vitals and nursing note reviewed.  Constitutional:      Appearance: Normal appearance.  HENT:     Head: Normocephalic and atraumatic.  Eyes:     General: No scleral icterus.    Conjunctiva/sclera: Conjunctivae normal.  Cardiovascular:  Rate and Rhythm: Normal rate and regular rhythm.  Pulmonary:     Effort: Pulmonary effort is normal. No respiratory distress.     Breath sounds: Normal breath sounds. No decreased breath sounds or wheezing.  Chest:     Chest wall: Tenderness present.     Comments: Producible over the anterior left chest wall Skin:    Findings: No rash.  Neurological:     Mental Status: He is alert.  Psychiatric:        Mood and Affect: Mood normal.     ED Results / Procedures / Treatments   Labs (all labs ordered are listed, but only abnormal results are displayed) Labs Reviewed  BASIC METABOLIC PANEL - Abnormal; Notable for the following components:      Result Value   Glucose,  Bld 100 (*)    All other components within normal limits  CBC - Abnormal; Notable for the following components:   RBC 4.04 (*)    Hemoglobin 10.1 (*)    HCT 30.1 (*)    MCV 74.5 (*)    MCH 25.0 (*)    All other components within normal limits  TROPONIN I (HIGH SENSITIVITY)    EKG None  Radiology DG Chest Port 1 View  Result Date: 11/20/2022 CLINICAL DATA:  Shortness of breath and chest pain EXAM: PORTABLE CHEST 1 VIEW COMPARISON:  09/29/2022 FINDINGS: Low lung volumes are present, causing crowding of the pulmonary vasculature. The patient is rotated to the left on today's radiograph, reducing diagnostic sensitivity and specificity. Reverse lordotic projection. Cardiac and mediastinal margins appear normal. No blunting of the costophrenic angles. No discrete airspace opacity identified. IMPRESSION: 1. Low lung volumes are present, causing crowding of the pulmonary vasculature. No discrete airspace opacity identified. Electronically Signed   By: Van Clines M.D.   On: 11/20/2022 17:00    Procedures Procedures   Medications Ordered in ED Medications - No data to display  ED Course/ Medical Decision Making/ A&P                             Medical Decision Making  37 year old male presenting due to chest pain. The emergent differential diagnosis of chest pain includes: Acute coronary syndrome, pericarditis, aortic dissection, pulmonary embolism, tension pneumothorax, and esophageal rupture.   This is not an exhaustive differential.    Past Medical History / Co-morbidities / Social History: Polysubstance use disorder, currently in custody   Physical Exam: Pertinent physical exam findings include Unremarkable reproducible over the left side of the patient's chest  Lab Tests: Normal troponin Hemoglobin 10 which appears to be around his baseline   Imaging Studies: I ordered and independently visualized and interpreted chest x-ray and I agree with the radiologist that  there are no acute findings   MDM/Disposition: This is a 37 year old with a past medical history of polysubstance use disorder and homelessness who is under custody who presented with chest pain.  I do have some suspicion for malingering to delay his time in custody however ACS, dissection, PE and other etiologies were considered.  Troponin negative.  Chest x-ray unremarkable.  Lab work baseline.  EKG nonischemic.  I believe we have reasonably screened him for emergent condition.  Doubt PE that needs further imaging or dimer.  Doubt dissection, vital signs stable.  Believe patient can be discharged at this time   Final Clinical Impression(s) / ED Diagnoses Final diagnoses:  Nonspecific chest pain    Rx /  DC Orders ED Discharge Orders     None      Results and diagnoses were explained to the patient. Return precautions discussed in full. Patient had no additional questions and expressed complete understanding.   This chart was dictated using voice recognition software.  Despite best efforts to proofread,  errors can occur which can change the documentation meaning.     Darliss Ridgel 11/20/22 1817    Davonna Belling, MD 11/21/22 1455

## 2022-11-20 NOTE — Discharge Instructions (Addendum)
You came to the emergency department today with pain in your chest.  I think it is your muscles and bones and you should use Tylenol and ibuprofen.  Ice and heat packs may help you to.  It was a pleasure to meet you and come back with any recurring or worsening symptoms.

## 2022-11-20 NOTE — ED Triage Notes (Signed)
Patient arrived via EMS and police due to complaints of chest pain. Patient reports that he has been having "blacking out spells" and chest pain. Patient very drowsy but cooperative during triage. Police at bedside.

## 2022-11-20 NOTE — ED Provider Triage Note (Signed)
Emergency Medicine Provider Triage Evaluation Note  Tristan [name] , a 37 y.o. male  was evaluated in triage.  Pt complains of pain and shortness of breath.  Brought in by PD.  He was being arrested for outstanding warrants and was complaining of chest pain and shortness of breath.  He is well-known to PD, he states that he often does not want to come into the hospital but today was complaining of the chest pain and shortness of breath and did want to be evaluated.  Known history of substance abuse. Patient states his symptoms started about 4 days ago  Review of Systems  Positive: Chest pain, SOB Negative: Vomiting, fever  Physical Exam  BP (!) 140/81 (BP Location: Left Arm)   Pulse 75   Temp 98.3 F (36.8 C) (Oral)   Resp 16   SpO2 97%  Gen:   Drowsy but spontaneously awake, no distress   Resp:  Normal effort  MSK:   Moves extremities without difficulty  Other:  Heart regular rate and rhythm, lungs clear to auscultation bilaterally  Medical Decision Making  Medically screening exam initiated at 4:33 PM.  Appropriate orders placed.  Leland Her Pieratt was informed that the remainder of the evaluation will be completed by another provider, this initial triage assessment does not replace that evaluation, and the importance of remaining in the ED until their evaluation is complete.     Gwenevere Abbot, [place] 11/20/22 [phone]

## 2022-12-05 ENCOUNTER — Emergency Department (HOSPITAL_COMMUNITY)
Admission: EM | Admit: 2022-12-05 | Discharge: 2022-12-06 | Disposition: A | Discharge status: Home / Self Care | Payer: Self-pay | Attending: Emergency Medicine | Admitting: Emergency Medicine

## 2022-12-05 ENCOUNTER — Other Ambulatory Visit: Payer: Self-pay

## 2022-12-05 ENCOUNTER — Encounter (HOSPITAL_COMMUNITY): Payer: Self-pay | Admitting: Emergency Medicine

## 2022-12-05 DIAGNOSIS — F1910 Other psychoactive substance abuse, uncomplicated: Secondary | ICD-10-CM | POA: Insufficient documentation

## 2022-12-05 DIAGNOSIS — F15929 Other stimulant use, unspecified with intoxication, unspecified: Secondary | ICD-10-CM

## 2022-12-05 DIAGNOSIS — F14920 Cocaine use, unspecified with intoxication, uncomplicated: Secondary | ICD-10-CM

## 2022-12-05 LAB — COMPREHENSIVE METABOLIC PANEL
ALT: 17 U/L (ref 0–44)
AST: 35 U/L (ref 15–41)
Albumin: 3.9 g/dL (ref 3.5–5.0)
Alkaline Phosphatase: 48 U/L (ref 38–126)
Anion gap: 4 — ABNORMAL LOW (ref 5–15)
BUN: 17 mg/dL (ref 6–20)
CO2: 26 mmol/L (ref 22–32)
Calcium: 8.8 mg/dL — ABNORMAL LOW (ref 8.9–10.3)
Chloride: 108 mmol/L (ref 98–111)
Creatinine, Ser: 0.94 mg/dL (ref 0.61–1.24)
GFR, Estimated: >60 mL/min (ref >60)
Glucose, Bld: 98 mg/dL (ref 70–99)
Potassium: 3.8 mmol/L (ref 3.5–5.1)
Sodium: 138 mmol/L (ref 135–145)
Total Bilirubin: 0.3 mg/dL (ref 0.3–1.2)
Total Protein: 7.2 g/dL (ref 6.5–8.1)

## 2022-12-05 LAB — CBC
HCT: 31.1 % — ABNORMAL LOW (ref 39.0–52.0)
Hemoglobin: 10.4 g/dL — ABNORMAL LOW (ref 13.0–17.0)
MCH: 24.8 pg — ABNORMAL LOW (ref 26.0–34.0)
MCHC: 33.4 g/dL (ref 30.0–36.0)
MCV: 74.2 fL — ABNORMAL LOW (ref 80.0–100.0)
Platelets: 327 10*3/uL (ref 150–400)
RBC: 4.19 MIL/uL — ABNORMAL LOW (ref 4.22–5.81)
RDW: 16.1 % — ABNORMAL HIGH (ref 11.5–15.5)
WBC: 6.9 10*3/uL (ref 4.0–10.5)
nRBC: 0 % (ref 0.0–0.2)

## 2022-12-05 LAB — ETHANOL: Alcohol, Ethyl (B): <10 mg/dL (ref <10)

## 2022-12-05 MED ORDER — MIDAZOLAM HCL 2 MG/2ML IJ SOLN: 2.0000 mg | Freq: Once | INTRAMUSCULAR | Status: DC

## 2022-12-05 MED ORDER — LORAZEPAM 1 MG PO TABS
2.0000 mg | ORAL_TABLET | Freq: Once | ORAL | Status: AC
  Administered 2022-12-05: 2 mg via ORAL
  Filled 2022-12-05: qty 2

## 2022-12-05 NOTE — ED Provider Notes (Signed)
Tristan [name] Note   CSN: [id number] Arrival date & time: 12/05/22  2023     History  Chief Complaint  Patient presents with   Drug Overdose    Tristan [name] is a 37 y.o. male with medical history of bipolar 1 disorder, hepatitis C, hypertension, polysubstance use.  Patient presents to ED after being found in [facility] bathroom naked from the waist down and shaking in his own urine.  Patient found with white substance in book bag.  Triage note reports patient is presenting with what appears to be a meth overdose over the patient denies any meth use.  Patient states that his drug of choice is heroin.  Patient unsure of why he is here, does not remember events leading up to him being in the hospital.  Patient denies any medical complaints.  Patient lifting bilateral upper and lower extremities in symmetric fashion.  Patient alert and oriented.   Drug Overdose       Home Medications Prior to Admission medications   Medication Sig Start Date End Date Taking? Authorizing [name]  escitalopram (LEXAPRO) 10 MG tablet Take 1 tablet (10 mg total) by mouth daily. Patient not taking: Reported on 05/23/2022 02/14/22 06/29/22  Harlow Asa, MD  folic acid (FOLVITE) 1 MG tablet Take 1 tablet (1 mg total) by mouth daily. Patient not taking: Reported on 02/27/2022 02/23/22   Flora Lipps, MD  gabapentin (NEURONTIN) 100 MG capsule Take 1 capsule (100 mg total) by mouth 3 (three) times daily. Patient not taking: Reported on 05/23/2022 02/14/22 06/29/22  Harlow Asa, MD  hydrOXYzine (ATARAX) 25 MG tablet Take 1 tablet (25 mg total) by mouth every 6 (six) hours as needed for anxiety. Patient not taking: Reported on 02/27/2022 02/22/22   Flora Lipps, MD  loperamide (IMODIUM) 2 MG capsule Take 1 capsule (2 mg total) by mouth as needed for diarrhea or loose stools (diarrhea). Patient not taking: Reported on 02/27/2022 02/22/22   Flora Lipps, MD  methocarbamol (ROBAXIN) 500 MG tablet Take 1 tablet (500 mg total) by mouth every 8 (eight) hours as needed for muscle spasms. Patient not taking: Reported on 02/27/2022 02/22/22   Flora Lipps, MD  Multiple Vitamin (MULTIVITAMIN WITH MINERALS) TABS tablet Take 1 tablet by mouth daily. Patient not taking: Reported on 02/27/2022 02/23/22   Flora Lipps, MD  naproxen (NAPROSYN) 500 MG tablet Take 1 tablet (500 mg total) by mouth 2 (two) times daily as needed (aching, pain, or discomfort). Patient not taking: Reported on 02/27/2022 02/22/22   Flora Lipps, MD  pantoprazole (PROTONIX) 40 MG tablet Take 1 tablet (40 mg total) by mouth daily. Patient not taking: Reported on 05/10/2022 01/23/22   Armando Reichert, MD  QUEtiapine (SEROQUEL) 100 MG tablet Take 1 tablet (100 mg total) by mouth at bedtime. Patient not taking: Reported on 05/23/2022 02/14/22 06/29/22  Harlow Asa, MD  thiamine 100 MG tablet Take 1 tablet (100 mg total) by mouth daily. Patient not taking: Reported on 05/23/2022 02/23/22   Flora Lipps, MD      Allergies    Patient has no known allergies.    Review of Systems   Review of Systems  All other systems reviewed and are negative.   Physical Exam Updated Vital Signs BP 106/66   Pulse 99   Temp 98.8 F (37.1 C) (Oral)   Resp 17   SpO2 99%  Physical Exam Vitals and nursing note reviewed.  Constitutional:  General: He is not in acute distress.    Appearance: He is well-developed.  HENT:     Head: Normocephalic and atraumatic.  Eyes:     Conjunctiva/sclera: Conjunctivae normal.  Cardiovascular:     Rate and Rhythm: Normal rate and regular rhythm.     Heart sounds: No murmur heard. Pulmonary:     Effort: Pulmonary effort is normal. No respiratory distress.     Breath sounds: Normal breath sounds.  Abdominal:     Palpations: Abdomen is soft.     Tenderness: There is no abdominal tenderness.  Musculoskeletal:        General: No swelling.     Cervical  back: Neck supple.  Skin:    General: Skin is warm and dry.     Capillary Refill: Capillary refill takes less than 2 seconds.  Neurological:     Mental Status: He is alert and oriented to person, place, and time.     ED Results / Procedures / Treatments   Labs (all labs ordered are listed, but only abnormal results are displayed) Labs Reviewed  COMPREHENSIVE METABOLIC PANEL - Abnormal; Notable for the following components:      Result Value   Calcium 8.8 (*)    Anion gap 4 (*)    All other components within normal limits  CBC - Abnormal; Notable for the following components:   RBC 4.19 (*)    Hemoglobin 10.4 (*)    HCT 31.1 (*)    MCV 74.2 (*)    MCH 24.8 (*)    RDW 16.1 (*)    All other components within normal limits  ETHANOL  RAPID URINE DRUG SCREEN, HOSP PERFORMED    EKG EKG Interpretation  Date/Time:  Thursday December 05 2022 20:34:32 EDT Ventricular Rate:  101 PR Interval:  181 QRS Duration: 95 QT Interval:  352 QTC Calculation: 457 R Axis:   86 Text Interpretation: Sinus tachycardia Multiform ventricular premature complexes Borderline ST elevation, lateral leads Baseline wander in lead(s) V4 Confirmed by Tretha Sciara [phone]) on 12/05/2022 9:09:49 PM  Radiology No results found.  Procedures Procedures   Medications Ordered in ED Medications  LORazepam (ATIVAN) tablet 2 mg (2 mg Oral Given 12/05/22 2127)    ED Course/ Medical Decision Making/ A&P Clinical Course as of 12/05/22 2343  Thu Dec 05, 2022  2109 Stable PS OD at a [place]. HX of similar [CC]    Clinical Course User Index [CC] Tretha Sciara, MD   Medical Decision Making Amount and/or Complexity of Data Reviewed Labs: ordered.  Risk Prescription drug management.   37 year old male with known history of polysubstance abuse presents to ED for evaluation.  Please see HPI for further details.  On examination patient afebrile and nontachycardic.  Lung sounds clear bilaterally, not  hypoxic.  Abdomen soft and compressible throughout.  Patient physical examination unremarkable.  CBC with no leukocytosis, baseline hemoglobin.  Ethanol undetectable.  CMP unremarkable.  Rapid urine drug screen pending.  Patient provided 1 mg Ativan.  Patient will need to metabolize to freedom.  Patient will be signed out to ongoing [name] Lorin Roemhildt, PA-C.   Final Clinical Impression(s) / ED Diagnoses Final diagnoses:  Polysubstance abuse [facility])    Rx / DC Orders ED Discharge Orders     None         Lawana Chambers 12/05/22 2343    Tretha Sciara, MD 12/06/22 1501

## 2022-12-05 NOTE — ED Triage Notes (Signed)
Patient presents with what is assumed to be a meth overdose. EMS also found a white substance in his book bag. The patient was found in the bathroom of [facility] naked from the waist down and shaking in his own urine.    EMS vitals: 118/64 BP 139 CBG

## 2022-12-05 NOTE — ED Notes (Signed)
Patient removing equipment to take vitals.

## 2022-12-06 LAB — RAPID URINE DRUG SCREEN, HOSP PERFORMED
Amphetamines: POSITIVE — AB
Barbiturates: NOT DETECTED
Benzodiazepines: POSITIVE — AB
Cocaine: POSITIVE — AB
Opiates: NOT DETECTED
Tetrahydrocannabinol: NOT DETECTED

## 2022-12-06 NOTE — ED Provider Notes (Signed)
Accepted handoff at shift change from [facility]. Please see prior provider note for full HPI.  Briefly: Patient is a 37 y.o. male who presents to the ER for drug overdose.  Patient was found with a white substance in his book bag, and the [place] bathroom. Concern for meth overdose. Pt with hx of polysubstance abuse, reports drug of choice is usually heroin.  DDX/Plan: patient was sleeping after ativan at time of shift change. Plan to reevaluate upon waking. Make sure patient can eat and ambulate prior to d/c.  Physical Exam  BP 124/85   Pulse 78   Temp 98.8 F (37.1 C) (Oral)   Resp 17   SpO2 100%   Physical Exam Vitals and nursing note reviewed.  Constitutional:      Appearance: Normal appearance.     Comments: Pt walking around exam room  HENT:     Head: Normocephalic and atraumatic.  Eyes:     Conjunctiva/sclera: Conjunctivae normal.  Pulmonary:     Effort: Pulmonary effort is normal. No respiratory distress.  Skin:    General: Skin is warm and dry.  Neurological:     Mental Status: He is alert.  Psychiatric:        Mood and Affect: Mood is anxious.        Speech: Speech is rapid and pressured.        Behavior: Behavior normal.    Results   Rapid urine drug screen (hospital performed)  Result Value Ref Range   Opiates NONE DETECTED NONE DETECTED   Cocaine POSITIVE (A) NONE DETECTED   Benzodiazepines POSITIVE (A) NONE DETECTED   Amphetamines POSITIVE (A) NONE DETECTED   Tetrahydrocannabinol NONE DETECTED NONE DETECTED   Barbiturates NONE DETECTED NONE DETECTED   ED Course / MDM   Clinical Course as of 12/06/22 0116  Thu Dec 05, 2022  2109 Stable PS OD at a [place]. HX of similar [CC]    Clinical Course User Index [CC] Tretha Sciara, MD   Medical Decision Making Amount and/or Complexity of Data Reviewed Labs: ordered.  Risk Prescription drug management.  Patient's UDS, positive for cocaine, benzodiazepines, amphetamines.  Patient is more awake after  dose of Ativan from prior provider.  He is walking around exam room, eating and drinking normally.  Appears somewhat agitated, but more coherent than he was upon arrival, appears to have sobered up.   After consideration of the diagnostic results and the patients response to treatment, I feel that emergency department workup does not suggest an emergent condition requiring admission or immediate intervention beyond what has been performed at this time. The plan is: discharge to home with resources for shelters and substance abuse assistance. The patient is safe for discharge and has been instructed to return immediately for worsening symptoms, change in symptoms or any other concerns.   Estill Cotta 12/06/22 0210    Ripley Fraise, MD 12/06/22 [phone]

## 2022-12-06 NOTE — Discharge Instructions (Signed)
You were seen in the ER after an overdose.   You tested positive for cocaine and methamphetamines. I've attached some recommendations for shelters in the area as well as substance abuse resources.   Continue to monitor how you're doing and return to the ER for new or worsening symptoms.

## 2022-12-26 ENCOUNTER — Ambulatory Visit: Payer: Self-pay | Admitting: Pharmacist

## 2022-12-26 ENCOUNTER — Ambulatory Visit: Payer: Self-pay

## 2022-12-26 ENCOUNTER — Encounter: Payer: Self-pay | Admitting: Infectious Diseases

## 2023-05-16 ENCOUNTER — Emergency Department (HOSPITAL_COMMUNITY)
Admission: EM | Admit: 2023-05-16 | Discharge: 2023-05-16 | Disposition: A | Discharge status: Home / Self Care | Payer: Self-pay | Attending: Student | Admitting: Student

## 2023-05-16 ENCOUNTER — Other Ambulatory Visit: Payer: Self-pay

## 2023-05-16 DIAGNOSIS — F1910 Other psychoactive substance abuse, uncomplicated: Secondary | ICD-10-CM | POA: Insufficient documentation

## 2023-05-16 LAB — CBG MONITORING, ED: Glucose-Capillary: 98 mg/dL (ref 70–99)

## 2023-05-16 NOTE — ED Triage Notes (Addendum)
Patient BIB by [facility]. Patient was at the [place] hotel in the entrance way. On the ground sitting with a needle next to him and a fresh needle mark. EMS did not have to narcan him he just came around. Patient has a hx of drug use but denies any drug use tonight.   178/92-108-97% RA.  78 CBG

## 2023-05-16 NOTE — ED Provider Notes (Signed)
[place] EMERGENCY DEPARTMENT AT [facility] Provider Note   CSN: [id number] Arrival date & time: 05/16/23  0053     History  Chief Complaint  Patient presents with   Drug Overdose    Tristan [name] is a 37 y.o. male.  37 year old male brought in by EMS.  Patient was lying at the entrance to a hotel on the ground with a needle next to him and a fresh puncture wound to his left AC.  EMS was able to wake patient without Narcan and brought him to the ER.  Patient denies any complaints or concerns.  Has a history of IV drug abuse although does not recall using tonight.  He denies alcohol use.  Denies auditory visual hallucinations, suicidal or homicidal ideation.  No complaints at this time.       Home Medications Prior to Admission medications   Medication Sig Start Date End Date Taking? Authorizing Provider  escitalopram (LEXAPRO) 10 MG tablet Take 1 tablet (10 mg total) by mouth daily. Patient not taking: Reported on 05/23/2022 02/14/22 06/29/22  Comer Locket, MD  folic acid (FOLVITE) 1 MG tablet Take 1 tablet (1 mg total) by mouth daily. Patient not taking: Reported on 02/27/2022 02/23/22   Joycelyn Das, MD  gabapentin (NEURONTIN) 100 MG capsule Take 1 capsule (100 mg total) by mouth 3 (three) times daily. Patient not taking: Reported on 05/23/2022 02/14/22 06/29/22  Comer Locket, MD  hydrOXYzine (ATARAX) 25 MG tablet Take 1 tablet (25 mg total) by mouth every 6 (six) hours as needed for anxiety. Patient not taking: Reported on 02/27/2022 02/22/22   Joycelyn Das, MD  loperamide (IMODIUM) 2 MG capsule Take 1 capsule (2 mg total) by mouth as needed for diarrhea or loose stools (diarrhea). Patient not taking: Reported on 02/27/2022 02/22/22   Joycelyn Das, MD  methocarbamol (ROBAXIN) 500 MG tablet Take 1 tablet (500 mg total) by mouth every 8 (eight) hours as needed for muscle spasms. Patient not taking: Reported on 02/27/2022 02/22/22   Joycelyn Das, MD   Multiple Vitamin (MULTIVITAMIN WITH MINERALS) TABS tablet Take 1 tablet by mouth daily. Patient not taking: Reported on 02/27/2022 02/23/22   Joycelyn Das, MD  naproxen (NAPROSYN) 500 MG tablet Take 1 tablet (500 mg total) by mouth 2 (two) times daily as needed (aching, pain, or discomfort). Patient not taking: Reported on 02/27/2022 02/22/22   Joycelyn Das, MD  pantoprazole (PROTONIX) 40 MG tablet Take 1 tablet (40 mg total) by mouth daily. Patient not taking: Reported on 05/10/2022 01/23/22   Karsten Ro, MD  QUEtiapine (SEROQUEL) 100 MG tablet Take 1 tablet (100 mg total) by mouth at bedtime. Patient not taking: Reported on 05/23/2022 02/14/22 06/29/22  Comer Locket, MD  thiamine 100 MG tablet Take 1 tablet (100 mg total) by mouth daily. Patient not taking: Reported on 05/23/2022 02/23/22   Joycelyn Das, MD      Allergies    Patient has no known allergies.    Review of Systems   Review of Systems Negative except as per HPI Physical Exam Updated Vital Signs BP (!) 128/91   Pulse (!) 106   Temp 98 F (36.7 C) (Oral)   Resp 16   SpO2 99%  Physical Exam Vitals and nursing note reviewed.  Constitutional:      General: He is not in acute distress.    Appearance: He is well-developed. He is not diaphoretic.  HENT:     Head: Normocephalic and atraumatic.  Cardiovascular:     Rate and Rhythm: Normal rate and regular rhythm.     Heart sounds: Normal heart sounds. No murmur heard. Pulmonary:     Effort: Pulmonary effort is normal.     Breath sounds: Normal breath sounds.  Skin:    General: Skin is warm and dry.     Comments: Puncture with small amount of dried blood to left [facility]  Neurological:     Mental Status: He is alert and oriented to person, place, and time.  Psychiatric:        Speech: Speech is tangential.        Behavior: Behavior is hyperactive.        Thought Content: Thought content does not include homicidal or suicidal ideation.     ED Results / Procedures /  Treatments   Labs (all labs ordered are listed, but only abnormal results are displayed) Labs Reviewed  CBG MONITORING, ED    EKG None  Radiology No results found.  Procedures Procedures    Medications Ordered in ED Medications - No data to display  ED Course/ Medical Decision Making/ A&P                                 Medical Decision Making  37 year old male brought in by EMS after presumed substance abuse.  Patient does not recall use tonight.  He denies suicidal or homicidal ideation.  He is found to have CBG of 78.  Plan is to feed patient, recheck CBG and likely discharge.        Final Clinical Impression(s) / ED Diagnoses Final diagnoses:  Intravenous drug abuse [facility])    Rx / DC Orders ED Discharge Orders     None         Alden Hipp 05/16/23 0126    Glendora Score, MD 05/16/23 [phone]

## 2023-05-16 NOTE — ED Notes (Signed)
Patient given a sandwich and juice and security removed patient from the property.

## 2023-06-06 ENCOUNTER — Other Ambulatory Visit: Payer: Self-pay

## 2023-06-06 ENCOUNTER — Emergency Department (HOSPITAL_COMMUNITY)
Admission: EM | Admit: 2023-06-06 | Discharge: 2023-06-07 | Discharge status: Against Medical Advice | Payer: Self-pay | Attending: Student | Admitting: Student

## 2023-06-06 ENCOUNTER — Encounter (HOSPITAL_COMMUNITY): Payer: Self-pay

## 2023-06-06 DIAGNOSIS — F19929 Other psychoactive substance use, unspecified with intoxication, unspecified: Secondary | ICD-10-CM | POA: Insufficient documentation

## 2023-06-06 DIAGNOSIS — Z5321 Procedure and treatment not carried out due to patient leaving prior to being seen by health care provider: Secondary | ICD-10-CM | POA: Insufficient documentation

## 2023-06-06 LAB — RAPID URINE DRUG SCREEN, HOSP PERFORMED
Amphetamines: POSITIVE — AB
Barbiturates: NOT DETECTED
Benzodiazepines: NOT DETECTED
Cocaine: POSITIVE — AB
Opiates: POSITIVE — AB
Tetrahydrocannabinol: NOT DETECTED

## 2023-06-06 LAB — CBC WITH DIFFERENTIAL/PLATELET
Abs Immature Granulocytes: 0.01 10*3/uL (ref 0.00–0.07)
Basophils Absolute: 0.1 10*3/uL (ref 0.0–0.1)
Basophils Relative: 1 %
Eosinophils Absolute: 0.9 10*3/uL — ABNORMAL HIGH (ref 0.0–0.5)
Eosinophils Relative: 10 %
HCT: 34.2 % — ABNORMAL LOW (ref 39.0–52.0)
Hemoglobin: 11.5 g/dL — ABNORMAL LOW (ref 13.0–17.0)
Immature Granulocytes: 0 %
Lymphocytes Relative: 28 %
Lymphs Abs: 2.4 10*3/uL (ref 0.7–4.0)
MCH: 26.3 pg (ref 26.0–34.0)
MCHC: 33.6 g/dL (ref 30.0–36.0)
MCV: 78.3 fL — ABNORMAL LOW (ref 80.0–100.0)
Monocytes Absolute: 0.5 10*3/uL (ref 0.1–1.0)
Monocytes Relative: 6 %
Neutro Abs: 4.8 10*3/uL (ref 1.7–7.7)
Neutrophils Relative %: 55 %
Platelets: 361 10*3/uL (ref 150–400)
RBC: 4.37 MIL/uL (ref 4.22–5.81)
RDW: 15.5 % (ref 11.5–15.5)
WBC: 8.7 10*3/uL (ref 4.0–10.5)
nRBC: 0 % (ref 0.0–0.2)

## 2023-06-06 LAB — COMPREHENSIVE METABOLIC PANEL
ALT: 14 U/L (ref 0–44)
AST: 25 U/L (ref 15–41)
Albumin: 4.1 g/dL (ref 3.5–5.0)
Alkaline Phosphatase: 69 U/L (ref 38–126)
Anion gap: 7 (ref 5–15)
BUN: 16 mg/dL (ref 6–20)
CO2: 27 mmol/L (ref 22–32)
Calcium: 9.3 mg/dL (ref 8.9–10.3)
Chloride: 105 mmol/L (ref 98–111)
Creatinine, Ser: 0.95 mg/dL (ref 0.61–1.24)
GFR, Estimated: >60 mL/min (ref >60)
Glucose, Bld: 96 mg/dL (ref 70–99)
Potassium: 4.2 mmol/L (ref 3.5–5.1)
Sodium: 139 mmol/L (ref 135–145)
Total Bilirubin: 0.5 mg/dL (ref 0.3–1.2)
Total Protein: 7.6 g/dL (ref 6.5–8.1)

## 2023-06-06 LAB — ACETAMINOPHEN LEVEL: Acetaminophen (Tylenol), Serum: <10 ug/mL — ABNORMAL LOW (ref 10–30)

## 2023-06-06 LAB — ETHANOL: Alcohol, Ethyl (B): <10 mg/dL (ref <10)

## 2023-06-06 LAB — SALICYLATE LEVEL: Salicylate Lvl: <7 mg/dL — ABNORMAL LOW (ref 7.0–30.0)

## 2023-06-06 NOTE — ED Provider Triage Note (Signed)
Emergency Medicine Provider Triage Evaluation Note  Tristan [name] , a 37 y.o. male  was evaluated in triage.  Pt complains of intoxication.  Under the influence of unknown substance possibly methamphetamines.  Denies SI, HI, and auditory/visual hallucinations.  Patient brought in by [employer]  Review of Systems  Positive: Drug problem Negative: SI  Physical Exam  BP 118/74 (BP Location: Left Arm)   Pulse (!) 120   Temp 98.7 F (37.1 C) (Oral)   Resp 20   Ht 5\' 10"  (1.778 m)   Wt 77.1 kg   SpO2 96%   BMI 24.39 kg/m  Gen:   Awake, no distress   Resp:  Normal effort  MSK:   Moves extremities without difficulty  Other:    Medical Decision Making  Medically screening exam initiated at 7:54 PM.  Appropriate orders placed.  Tristan [name] was informed that the remainder of the evaluation will be completed by another provider, this initial triage assessment does not replace that evaluation, and the importance of remaining in the ED until their evaluation is complete.  labs   Tristan [name] 06/06/23 1955

## 2023-06-06 NOTE — ED Triage Notes (Signed)
BIB police. Pt was found rolling around in grass. Pt endorses he used meth today and uses IV drugs daily.

## 2023-06-07 NOTE — ED Notes (Signed)
Pt called to update vitals with no answer

## 2023-06-07 NOTE — ED Notes (Signed)
No answer for vital sign recheck. 

## 2023-06-08 ENCOUNTER — Inpatient Hospital Stay (HOSPITAL_COMMUNITY)
Admission: EM | Admit: 2023-06-08 | Discharge: 2023-06-11 | DRG: 917 | Disposition: A | Discharge status: Home / Self Care | Payer: Self-pay | Attending: Critical Care Medicine | Admitting: Critical Care Medicine

## 2023-06-08 ENCOUNTER — Emergency Department (HOSPITAL_COMMUNITY): Payer: Self-pay

## 2023-06-08 ENCOUNTER — Other Ambulatory Visit: Payer: Self-pay

## 2023-06-08 DIAGNOSIS — F1210 Cannabis abuse, uncomplicated: Secondary | ICD-10-CM | POA: Diagnosis present

## 2023-06-08 DIAGNOSIS — J9811 Atelectasis: Secondary | ICD-10-CM | POA: Diagnosis present

## 2023-06-08 DIAGNOSIS — F1410 Cocaine abuse, uncomplicated: Secondary | ICD-10-CM | POA: Diagnosis present

## 2023-06-08 DIAGNOSIS — Z608 Other problems related to social environment: Secondary | ICD-10-CM | POA: Diagnosis present

## 2023-06-08 DIAGNOSIS — Z21 Asymptomatic human immunodeficiency virus [HIV] infection status: Secondary | ICD-10-CM | POA: Diagnosis present

## 2023-06-08 DIAGNOSIS — E876 Hypokalemia: Secondary | ICD-10-CM | POA: Diagnosis present

## 2023-06-08 DIAGNOSIS — G928 Other toxic encephalopathy: Secondary | ICD-10-CM | POA: Diagnosis present

## 2023-06-08 DIAGNOSIS — Z5982 Transportation insecurity: Secondary | ICD-10-CM

## 2023-06-08 DIAGNOSIS — F1110 Opioid abuse, uncomplicated: Secondary | ICD-10-CM | POA: Diagnosis present

## 2023-06-08 DIAGNOSIS — J9612 Chronic respiratory failure with hypercapnia: Secondary | ICD-10-CM | POA: Diagnosis present

## 2023-06-08 DIAGNOSIS — R21 Rash and other nonspecific skin eruption: Secondary | ICD-10-CM | POA: Diagnosis present

## 2023-06-08 DIAGNOSIS — F4310 Post-traumatic stress disorder, unspecified: Secondary | ICD-10-CM | POA: Diagnosis present

## 2023-06-08 DIAGNOSIS — W19XXXA Unspecified fall, initial encounter: Secondary | ICD-10-CM | POA: Diagnosis present

## 2023-06-08 DIAGNOSIS — G9389 Other specified disorders of brain: Secondary | ICD-10-CM | POA: Diagnosis present

## 2023-06-08 DIAGNOSIS — I10 Essential (primary) hypertension: Secondary | ICD-10-CM | POA: Diagnosis present

## 2023-06-08 DIAGNOSIS — B182 Chronic viral hepatitis C: Secondary | ICD-10-CM | POA: Diagnosis present

## 2023-06-08 DIAGNOSIS — B20 Human immunodeficiency virus [HIV] disease: Secondary | ICD-10-CM

## 2023-06-08 DIAGNOSIS — F1910 Other psychoactive substance abuse, uncomplicated: Principal | ICD-10-CM

## 2023-06-08 DIAGNOSIS — F319 Bipolar disorder, unspecified: Secondary | ICD-10-CM | POA: Diagnosis present

## 2023-06-08 DIAGNOSIS — G929 Unspecified toxic encephalopathy: Secondary | ICD-10-CM | POA: Diagnosis present

## 2023-06-08 DIAGNOSIS — R0902 Hypoxemia: Secondary | ICD-10-CM | POA: Diagnosis present

## 2023-06-08 DIAGNOSIS — F1510 Other stimulant abuse, uncomplicated: Secondary | ICD-10-CM | POA: Diagnosis present

## 2023-06-08 DIAGNOSIS — F17210 Nicotine dependence, cigarettes, uncomplicated: Secondary | ICD-10-CM | POA: Diagnosis present

## 2023-06-08 DIAGNOSIS — D509 Iron deficiency anemia, unspecified: Secondary | ICD-10-CM | POA: Diagnosis present

## 2023-06-08 DIAGNOSIS — Z79899 Other long term (current) drug therapy: Secondary | ICD-10-CM

## 2023-06-08 DIAGNOSIS — S0001XA Abrasion of scalp, initial encounter: Secondary | ICD-10-CM | POA: Diagnosis present

## 2023-06-08 DIAGNOSIS — F10221 Alcohol dependence with intoxication delirium: Secondary | ICD-10-CM | POA: Diagnosis present

## 2023-06-08 DIAGNOSIS — F419 Anxiety disorder, unspecified: Secondary | ICD-10-CM | POA: Diagnosis present

## 2023-06-08 DIAGNOSIS — T50901A Poisoning by unspecified drugs, medicaments and biological substances, accidental (unintentional), initial encounter: Secondary | ICD-10-CM | POA: Diagnosis present

## 2023-06-08 DIAGNOSIS — Z5900 Homelessness unspecified: Secondary | ICD-10-CM

## 2023-06-08 DIAGNOSIS — T40601A Poisoning by unspecified narcotics, accidental (unintentional), initial encounter: Principal | ICD-10-CM | POA: Diagnosis present

## 2023-06-08 LAB — COMPREHENSIVE METABOLIC PANEL
ALT: 14 U/L (ref 0–44)
AST: 27 U/L (ref 15–41)
Albumin: 3.7 g/dL (ref 3.5–5.0)
Alkaline Phosphatase: 60 U/L (ref 38–126)
Anion gap: 8 (ref 5–15)
BUN: 17 mg/dL (ref 6–20)
CO2: 27 mmol/L (ref 22–32)
Calcium: 9.3 mg/dL (ref 8.9–10.3)
Chloride: 103 mmol/L (ref 98–111)
Creatinine, Ser: 1.12 mg/dL (ref 0.61–1.24)
GFR, Estimated: >60 mL/min (ref >60)
Glucose, Bld: 89 mg/dL (ref 70–99)
Potassium: 3.9 mmol/L (ref 3.5–5.1)
Sodium: 138 mmol/L (ref 135–145)
Total Bilirubin: 0.6 mg/dL (ref 0.3–1.2)
Total Protein: 6.9 g/dL (ref 6.5–8.1)

## 2023-06-08 LAB — CBG MONITORING, ED
Glucose-Capillary: 69 mg/dL — ABNORMAL LOW (ref 70–99)
Glucose-Capillary: 68 mg/dL — ABNORMAL LOW (ref 70–99)

## 2023-06-08 LAB — CBC WITH DIFFERENTIAL/PLATELET
Abs Immature Granulocytes: 0.04 10*3/uL (ref 0.00–0.07)
Basophils Absolute: 0.1 10*3/uL (ref 0.0–0.1)
Basophils Relative: 1 %
Eosinophils Absolute: 0.5 10*3/uL (ref 0.0–0.5)
Eosinophils Relative: 5 %
HCT: 30.7 % — ABNORMAL LOW (ref 39.0–52.0)
Hemoglobin: 10.3 g/dL — ABNORMAL LOW (ref 13.0–17.0)
Immature Granulocytes: 0 %
Lymphocytes Relative: 14 %
Lymphs Abs: 1.3 10*3/uL (ref 0.7–4.0)
MCH: 26 pg (ref 26.0–34.0)
MCHC: 33.6 g/dL (ref 30.0–36.0)
MCV: 77.5 fL — ABNORMAL LOW (ref 80.0–100.0)
Monocytes Absolute: 0.5 10*3/uL (ref 0.1–1.0)
Monocytes Relative: 5 %
Neutro Abs: 7.1 10*3/uL (ref 1.7–7.7)
Neutrophils Relative %: 75 %
Platelets: 359 10*3/uL (ref 150–400)
RBC: 3.96 MIL/uL — ABNORMAL LOW (ref 4.22–5.81)
RDW: 15.2 % (ref 11.5–15.5)
WBC: 9.5 10*3/uL (ref 4.0–10.5)
nRBC: 0 % (ref 0.0–0.2)

## 2023-06-08 LAB — URINALYSIS, ROUTINE W REFLEX MICROSCOPIC
Bilirubin Urine: NEGATIVE
Glucose, UA: NEGATIVE mg/dL
Hgb urine dipstick: NEGATIVE
Ketones, ur: NEGATIVE mg/dL
Leukocytes,Ua: NEGATIVE
Nitrite: NEGATIVE
Protein, ur: NEGATIVE mg/dL
Specific Gravity, Urine: 1.023 (ref 1.005–1.030)
pH: 6 (ref 5.0–8.0)

## 2023-06-08 LAB — CK: Total CK: 427 U/L — ABNORMAL HIGH (ref 49–397)

## 2023-06-08 LAB — BLOOD GAS, ARTERIAL
Acid-Base Excess: 6 mmol/L — ABNORMAL HIGH (ref 0.0–2.0)
Bicarbonate: 32.1 mmol/L — ABNORMAL HIGH (ref 20.0–28.0)
Drawn by: 68347
O2 Saturation: 99.1 %
Patient temperature: 36.9
pCO2 arterial: 53 mmHg — ABNORMAL HIGH (ref 32–48)
pH, Arterial: 7.39 (ref 7.35–7.45)
pO2, Arterial: 90 mmHg (ref 83–108)

## 2023-06-08 LAB — MRSA NEXT GEN BY PCR, NASAL: MRSA by PCR Next Gen: DETECTED — AB

## 2023-06-08 LAB — RAPID URINE DRUG SCREEN, HOSP PERFORMED
Amphetamines: POSITIVE — AB
Barbiturates: NOT DETECTED
Benzodiazepines: NOT DETECTED
Cocaine: POSITIVE — AB
Opiates: POSITIVE — AB
Tetrahydrocannabinol: POSITIVE — AB

## 2023-06-08 LAB — LACTIC ACID, PLASMA
Lactic Acid, Venous: 1.8 mmol/L (ref 0.5–1.9)
Lactic Acid, Venous: 1.6 mmol/L (ref 0.5–1.9)

## 2023-06-08 LAB — ACETAMINOPHEN LEVEL: Acetaminophen (Tylenol), Serum: <10 ug/mL — ABNORMAL LOW (ref 10–30)

## 2023-06-08 LAB — TROPONIN I (HIGH SENSITIVITY)
Troponin I (High Sensitivity): 5 ng/L (ref <18)
Troponin I (High Sensitivity): 7 ng/L (ref <18)

## 2023-06-08 LAB — SALICYLATE LEVEL: Salicylate Lvl: <7 mg/dL — ABNORMAL LOW (ref 7.0–30.0)

## 2023-06-08 LAB — GLUCOSE, CAPILLARY: Glucose-Capillary: 106 mg/dL — ABNORMAL HIGH (ref 70–99)

## 2023-06-08 LAB — ETHANOL: Alcohol, Ethyl (B): <10 mg/dL (ref <10)

## 2023-06-08 MED ORDER — INSULIN ASPART 100 UNIT/ML IJ SOLN
0.0000 [IU] | INTRAMUSCULAR | Status: DC
  Administered 2023-06-10 – 2023-06-11 (×3): 1 [IU] via SUBCUTANEOUS
  Filled 2023-06-08: qty 0.09

## 2023-06-08 MED ORDER — ONDANSETRON HCL 4 MG/2ML IJ SOLN: 4.0000 mg | Freq: Four times a day (QID) | INTRAMUSCULAR | Status: DC | PRN

## 2023-06-08 MED ORDER — ORAL CARE MOUTH RINSE: 15.0000 mL | OROMUCOSAL | Status: DC | PRN

## 2023-06-08 MED ORDER — NALOXONE HCL 0.4 MG/ML IJ SOLN
INTRAMUSCULAR | Status: AC
  Administered 2023-06-08: 0.2 mg/mL
  Filled 2023-06-08: qty 1

## 2023-06-08 MED ORDER — DOCUSATE SODIUM 100 MG PO CAPS: 100.0000 mg | ORAL_CAPSULE | Freq: Two times a day (BID) | ORAL | Status: DC | PRN

## 2023-06-08 MED ORDER — ENOXAPARIN SODIUM 40 MG/0.4ML IJ SOSY
40.0000 mg | PREFILLED_SYRINGE | INTRAMUSCULAR | Status: DC
  Administered 2023-06-08 – 2023-06-10 (×3): 40 mg via SUBCUTANEOUS
  Filled 2023-06-08 (×2): qty 0.4

## 2023-06-08 MED ORDER — POLYETHYLENE GLYCOL 3350 17 G PO PACK: 17.0000 g | PACK | Freq: Every day | ORAL | Status: DC | PRN

## 2023-06-08 MED ORDER — LACTATED RINGERS IV SOLN: INTRAVENOUS | Status: AC

## 2023-06-08 MED ORDER — LACTATED RINGERS IV BOLUS
1000.0000 mL | Freq: Once | INTRAVENOUS | Status: AC
  Administered 2023-06-08: 1000 mL via INTRAVENOUS

## 2023-06-08 MED ORDER — ACETAMINOPHEN 325 MG PO TABS: 650.0000 mg | ORAL_TABLET | ORAL | Status: DC | PRN

## 2023-06-08 MED ORDER — NALOXONE HCL 4 MG/10ML IJ SOLN
0.4000 mg/h | INTRAVENOUS | Status: DC
  Administered 2023-06-08 – 2023-06-09 (×2): 0.4 mg/h via INTRAVENOUS
  Filled 2023-06-08 (×2): qty 10

## 2023-06-08 MED ORDER — BENZTROPINE MESYLATE 1 MG/ML IJ SOLN
2.0000 mg | Freq: Once | INTRAMUSCULAR | Status: AC
  Administered 2023-06-08: 2 mg via INTRAMUSCULAR
  Filled 2023-06-08: qty 2

## 2023-06-08 MED ORDER — CHLORHEXIDINE GLUCONATE CLOTH 2 % EX PADS
6.0000 | MEDICATED_PAD | Freq: Every day | CUTANEOUS | Status: DC
  Administered 2023-06-08 – 2023-06-09 (×2): 6 via TOPICAL

## 2023-06-08 MED ORDER — LORAZEPAM 2 MG/ML IJ SOLN
2.0000 mg | Freq: Once | INTRAMUSCULAR | Status: AC
  Administered 2023-06-08: 2 mg via INTRAMUSCULAR
  Filled 2023-06-08: qty 1

## 2023-06-08 NOTE — ED Notes (Signed)
Patient remains sleep at this time O2 remains in upper 90s currently 95% RA.

## 2023-06-08 NOTE — ED Notes (Signed)
Per Eloise Harman, MD RN gave remaining 0.2mg /ml of Narcan

## 2023-06-08 NOTE — Progress Notes (Addendum)
eLink Physician-Brief Progress Note Patient Name: Tristan [name] DOB: 05-22-1986 MRN: [id number]   Date of Service  06/08/2023  HPI/Events of Note  Notified that patient passed swallow evaluation.  Pt is a 37/M with toxic metabolic encephalopathy.   BP 119/57, HR 66, RR 22, O2 sats 98%.     eICU Interventions  Regular diet ordered.     Intervention Category Minor Interventions: Other:  Larinda Buttery 06/08/2023, 8:00 PM  8:42 PM Received query on intervention in case the patient gets agitated.  He is wake and calm right now.   Plan> Turn off narcan gtt first.  Will hold off on sedating medications at this time.   3:29 AM Notified by RN that narcan gtt has been turned off.  Pt is sleeping comfortably.  No agitation.   BP 136/74, HR 64, RR 17-25, O2 sats 99%.   Plan> Continue to monitor off narcan gtt.

## 2023-06-08 NOTE — H&P (Signed)
NAME:  Delancey Talamo, MRN:  [id number], DOB:  22-Oct-1985, LOS: 0 ADMISSION DATE:  06/08/2023, CONSULTATION DATE:  06/08/23 REFERRING MD:  ed, CHIEF COMPLAINT: Encephalopathy  History of Present Illness:  37 year old history of polysubstance abuse presents via EMS with encephalopathy subsequent lethargy felt to be related to substance abuse.  Patient is not very interactive.  Seems like he does not really want or interact.  He is easily arousable.  Does not give any history.  Per ED doctor and ED documentation was found in the woods.  Maybe a bit agitated.  Brought to the ED.  Simply had respirator pression.  Given Narcan with improvement.  Actually on agitated.  Then got Ativan.  Monitored for several hours and still a bit lethargic.  Was placed on a Narcan drip.  Blood work reassuring.  Chest x-ray with atelectasis.  Urine drug screen with multiple substances positive.  Pertinent  Medical History  Polysubstance abuse  Significant Hospital Events: Including procedures, antibiotic start and stop dates in addition to other pertinent events   06/08/2023 presents to the ED encephalopathic via EMS, monitored for several hours and lethargic prompting Narcan drip and ICU consult  Interim History / Subjective:    Objective   Blood pressure 127/89, pulse 87, temperature 97.7 F (36.5 C), temperature source Oral, resp. rate 18, SpO2 100%.       No intake or output data in the 24 hours ending 06/08/23 1708 There were no vitals filed for this visit.  Examination: General: Chronically ill-appearing, lying in bed, sleeping HENT: Atraumatic normocephalic, moist mucous membranes Lungs: Diminished, normal work of breathing Cardiovascular: Regular and rhythm, no murmur Abdomen: Nondistended, nontender Neuro: Lethargic, sleepy, easily arousable, drifts back to sleep.  Resolved Hospital Problem list     Assessment & Plan:  Toxic encephalopathy: With IV drug use, presumably opiates.   Also got Ativan in the ED. -- Responded to Narcan, continue Narcan drip, would discontinue in the morning and reassess with more time from ingestion -- CT head clear  Hypercarbic respiratory failure: Seems chronic given well compensated blood gas.  Likely acutely worsened in the setting of rest or depression due to ingestion. -- Supplemental oxygen as needed, no role for NIPPV now especially with encephalopathy symptoms more related to ingestion than carbon dioxide  Anemia: With low MCV.  High suspicion for iron deficiency anemia. -- Consider iron supplementation p.o. once more alert, consider referral to hematology for ongoing treatment  Polysubstance abuse: Urine drug screen positive for amphetamines, opiates, cocaine, THC. -- Provide counseling once sober, less encephalopathic -- HIV, acute viral hepatitis panel sent  Hypoxemia: Due to atelectasis with streaky bilateral opacities, pressor depression with ingestion. -- Supplemental oxygen, goal O2 sat 90% or greater  Best Practice (right click and "Reselect all SmartList Selections" daily)   Diet/type: NPO DVT prophylaxis: LMWH GI prophylaxis: N/A Lines: N/A Foley:  N/A Code Status:  full code Last date of multidisciplinary goals of care discussion [briefly discussed with patient, was not very interactive, deferred to full code ]  Labs   CBC: Recent Labs  Lab 06/06/23 2037 06/08/23 0429  WBC 8.7 9.5  NEUTROABS 4.8 7.1  HGB 11.5* 10.3*  HCT 34.2* 30.7*  MCV 78.3* 77.5*  PLT 361 359    Basic Metabolic Panel: Recent Labs  Lab 06/06/23 2037 06/08/23 0429  NA 139 138  K 4.2 3.9  CL 105 103  CO2 27 27  GLUCOSE 96 89  BUN 16 17  CREATININE 0.95 1.12  CALCIUM 9.3 9.3   GFR: Estimated Creatinine Clearance: 93.2 mL/min (by C-G formula based on SCr of 1.12 mg/dL). Recent Labs  Lab 06/06/23 2037 06/08/23 0429 06/08/23 0430 06/08/23 0630  WBC 8.7 9.5  --   --   LATICACIDVEN  --   --  1.8 1.6    Liver Function  Tests: Recent Labs  Lab 06/06/23 2037 06/08/23 0429  AST 25 27  ALT 14 14  ALKPHOS 69 60  BILITOT 0.5 0.6  PROT 7.6 6.9  ALBUMIN 4.1 3.7   No results for input(s): "LIPASE", "AMYLASE" in the last 168 hours. No results for input(s): "AMMONIA" in the last 168 hours.  ABG    Component Value Date/Time   PHART 7.39 06/08/2023 0430   PCO2ART 53 (H) 06/08/2023 0430   PO2ART 90 06/08/2023 0430   HCO3 32.1 (H) 06/08/2023 0430   TCO2 26 02/18/2022 1613   ACIDBASEDEF 2.0 02/18/2022 1613   O2SAT 99.1 06/08/2023 0430     Coagulation Profile: No results for input(s): "INR", "PROTIME" in the last 168 hours.  Cardiac Enzymes: Recent Labs  Lab 06/08/23 0429  CKTOTAL 427*    HbA1C: Hgb A1c MFr Bld  Date/Time Value Ref Range Status  11/21/2021 07:54 PM 5.1 4.8 - 5.6 % Final    Comment:    (NOTE) Pre diabetes:          5.7%-6.4%  Diabetes:              >6.4%  Glycemic control for   <7.0% adults with diabetes   02/22/2019 06:23 AM 5.0 4.8 - 5.6 % Final    Comment:    (NOTE) Pre diabetes:          5.7%-6.4% Diabetes:              >6.4% Glycemic control for   <7.0% adults with diabetes     CBG: Recent Labs  Lab 06/08/23 1346  GLUCAP 69*    Review of Systems:   Unobtainable due to patient factors, encephalopathy  Past Medical History:  He,  has a past medical history of Bipolar 1 disorder (HCC), Hepatitis C, Hypertension, and PTSD (post-traumatic stress disorder).   Surgical History:   Past Surgical History:  Procedure Laterality Date   I & D EXTREMITY Left 05/11/2022   Procedure: IRRIGATION AND DEBRIDEMENT FOREARM;  Surgeon: Marlyne Beards, MD;  Location: [facility] OR;  Service: Orthopedics;  Laterality: Left;   NO PAST SURGERIES       Social History:   reports that he has been smoking cigarettes. He has never used smokeless tobacco. He reports current alcohol use. He reports current drug use. Drugs: Marijuana, Heroin, Cocaine, Methamphetamines, and Morphine.    Family History:  His family history includes Other in his father; Psychiatric Illness in his father.   Allergies No Known Allergies   Home Medications  Prior to Admission medications   Medication Sig Start Date End Date Taking? Authorizing Provider  escitalopram (LEXAPRO) 10 MG tablet Take 1 tablet (10 mg total) by mouth daily. Patient not taking: Reported on 05/23/2022 02/14/22 06/29/22  Comer Locket, MD  folic acid (FOLVITE) 1 MG tablet Take 1 tablet (1 mg total) by mouth daily. Patient not taking: Reported on 02/27/2022 02/23/22   Joycelyn Das, MD  gabapentin (NEURONTIN) 100 MG capsule Take 1 capsule (100 mg total) by mouth 3 (three) times daily. Patient not taking: Reported on 05/23/2022 02/14/22 06/29/22  Comer Locket, MD  hydrOXYzine (ATARAX) 25 MG tablet Take  1 tablet (25 mg total) by mouth every 6 (six) hours as needed for anxiety. Patient not taking: Reported on 02/27/2022 02/22/22   Joycelyn Das, MD  loperamide (IMODIUM) 2 MG capsule Take 1 capsule (2 mg total) by mouth as needed for diarrhea or loose stools (diarrhea). Patient not taking: Reported on 02/27/2022 02/22/22   Joycelyn Das, MD  methocarbamol (ROBAXIN) 500 MG tablet Take 1 tablet (500 mg total) by mouth every 8 (eight) hours as needed for muscle spasms. Patient not taking: Reported on 02/27/2022 02/22/22   Joycelyn Das, MD  Multiple Vitamin (MULTIVITAMIN WITH MINERALS) TABS tablet Take 1 tablet by mouth daily. Patient not taking: Reported on 02/27/2022 02/23/22   Joycelyn Das, MD  naproxen (NAPROSYN) 500 MG tablet Take 1 tablet (500 mg total) by mouth 2 (two) times daily as needed (aching, pain, or discomfort). Patient not taking: Reported on 02/27/2022 02/22/22   Joycelyn Das, MD  pantoprazole (PROTONIX) 40 MG tablet Take 1 tablet (40 mg total) by mouth daily. Patient not taking: Reported on 05/10/2022 01/23/22   Karsten Ro, MD  QUEtiapine (SEROQUEL) 100 MG tablet Take 1 tablet (100 mg total) by mouth at  bedtime. Patient not taking: Reported on 05/23/2022 02/14/22 06/29/22  Comer Locket, MD  thiamine 100 MG tablet Take 1 tablet (100 mg total) by mouth daily. Patient not taking: Reported on 05/23/2022 02/23/22   Joycelyn Das, MD     Critical care time:    CRITICAL CARE Performed by: Karren Burly   Total critical care time: 31 minutes  Critical care time was exclusive of separately billable procedures and treating other patients.  Critical care was necessary to treat or prevent imminent or life-threatening deterioration.  Critical care was time spent personally by me on the following activities: development of treatment plan with patient and/or surrogate as well as nursing, discussions with consultants, evaluation of patient's response to treatment, examination of patient, obtaining history from patient or surrogate, ordering and performing treatments and interventions, ordering and review of laboratory studies, ordering and review of radiographic studies, pulse oximetry and re-evaluation of patient's condition.  Karren Burly, MD See Loretha Stapler for contact info

## 2023-06-08 NOTE — ED Notes (Signed)
O2 and C -collar removed by MD

## 2023-06-08 NOTE — H&P (Signed)
NAME:  Sharman Fairbairn, MRN:  [id number], DOB:  [birth year], LOS: 0 ADMISSION DATE:  06/08/2023, CONSULTATION DATE:  06/08/23 REFERRING MD:  ed, CHIEF COMPLAINT: Encephalopathy  History of Present Illness:  37 year old history of polysubstance abuse presents via EMS with encephalopathy subsequent lethargy felt to be related to substance abuse.  Patient is not very interactive.  Seems like he does not really want or interact.  He is easily arousable.  Does not give any history.  Per ED doctor and ED documentation was found in the woods.  Maybe a bit agitated.  Brought to the ED.  Simply had respirator pression.  Given Narcan with improvement.  Actually on agitated.  Then got Ativan.  Monitored for several hours and still a bit lethargic.  Was placed on a Narcan drip.  Blood work reassuring.  Chest x-ray with atelectasis.  Urine drug screen with multiple substances positive.  Pertinent  Medical History  Polysubstance abuse  Significant Hospital Events: Including procedures, antibiotic start and stop dates in addition to other pertinent events   06/08/2023 presents to the ED encephalopathic via EMS, monitored for several hours and lethargic prompting Narcan drip and ICU consult  Interim History / Subjective:    Objective   Blood pressure 127/89, pulse 87, temperature 97.7 F (36.5 C), temperature source Oral, resp. rate 18, SpO2 100%.       No intake or output data in the 24 hours ending 06/08/23 1658 There were no vitals filed for this visit.  Examination: General: Chronically ill-appearing, lying in bed, sleeping HENT: Atraumatic normocephalic, moist mucous membranes Lungs: Diminished, normal work of breathing Cardiovascular: Regular and rhythm, no murmur Abdomen: Nondistended, nontender Neuro: Lethargic, sleepy, easily arousable, drifts back to sleep.  Resolved Hospital Problem list     Assessment & Plan:  Toxic encephalopathy: With IV drug use, presumably opiates.   Also got Ativan in the ED. -- Responded to Narcan, continue Narcan drip, would discontinue in the morning and reassess with more time from ingestion -- CT head clear  Hypercarbic respiratory failure: Seems chronic given well compensated blood gas.  Likely acutely worsened in the setting of rest or depression due to ingestion. -- Supplemental oxygen as needed, no role for NIPPV now especially with encephalopathy symptoms more related to ingestion than carbon dioxide  Anemia: With low MCV.  High suspicion for iron deficiency anemia. -- Consider iron supplementation p.o. once more alert, consider referral to hematology for ongoing treatment  Polysubstance abuse: Urine drug screen positive for amphetamines, opiates, cocaine, THC. -- Provide counseling once sober, less encephalopathic  Hypoxemia: Due to atelectasis with streaky bilateral opacities, pressor depression with ingestion. -- Supplemental oxygen, goal O2 sat 90% or greater  Best Practice (right click and "Reselect all SmartList Selections" daily)   Diet/type: NPO DVT prophylaxis: LMWH GI prophylaxis: N/A Lines: N/A Foley:  N/A Code Status:  full code Last date of multidisciplinary goals of care discussion [briefly discussed with patient, was not very interactive, deferred to full code ]  Labs   CBC: Recent Labs  Lab 06/06/23 2037 06/08/23 0429  WBC 8.7 9.5  NEUTROABS 4.8 7.1  HGB 11.5* 10.3*  HCT 34.2* 30.7*  MCV 78.3* 77.5*  PLT 361 359    Basic Metabolic Panel: Recent Labs  Lab 06/06/23 2037 06/08/23 0429  NA 139 138  K 4.2 3.9  CL 105 103  CO2 27 27  GLUCOSE 96 89  BUN 16 17  CREATININE 0.95 1.12  CALCIUM 9.3 9.3   GFR:  Estimated Creatinine Clearance: 93.2 mL/min (by C-G formula based on SCr of 1.12 mg/dL). Recent Labs  Lab 06/06/23 2037 06/08/23 0429 06/08/23 0430 06/08/23 0630  WBC 8.7 9.5  --   --   LATICACIDVEN  --   --  1.8 1.6    Liver Function Tests: Recent Labs  Lab 06/06/23 2037  06/08/23 0429  AST 25 27  ALT 14 14  ALKPHOS 69 60  BILITOT 0.5 0.6  PROT 7.6 6.9  ALBUMIN 4.1 3.7   No results for input(s): "LIPASE", "AMYLASE" in the last 168 hours. No results for input(s): "AMMONIA" in the last 168 hours.  ABG    Component Value Date/Time   PHART 7.39 06/08/2023 0430   PCO2ART 53 (H) 06/08/2023 0430   PO2ART 90 06/08/2023 0430   HCO3 32.1 (H) 06/08/2023 0430   TCO2 26 02/18/2022 1613   ACIDBASEDEF 2.0 02/18/2022 1613   O2SAT 99.1 06/08/2023 0430     Coagulation Profile: No results for input(s): "INR", "PROTIME" in the last 168 hours.  Cardiac Enzymes: Recent Labs  Lab 06/08/23 0429  CKTOTAL 427*    HbA1C: Hgb A1c MFr Bld  Date/Time Value Ref Range Status  11/21/2021 07:54 PM 5.1 4.8 - 5.6 % Final    Comment:    (NOTE) Pre diabetes:          5.7%-6.4%  Diabetes:              >6.4%  Glycemic control for   <7.0% adults with diabetes   02/22/2019 06:23 AM 5.0 4.8 - 5.6 % Final    Comment:    (NOTE) Pre diabetes:          5.7%-6.4% Diabetes:              >6.4% Glycemic control for   <7.0% adults with diabetes     CBG: Recent Labs  Lab 06/08/23 1346  GLUCAP 69*    Review of Systems:   Unobtainable due to patient factors, encephalopathy  Past Medical History:  He,  has a past medical history of Bipolar 1 disorder (HCC), Hepatitis C, Hypertension, and PTSD (post-traumatic stress disorder).   Surgical History:   Past Surgical History:  Procedure Laterality Date   I & D EXTREMITY Left 05/11/2022   Procedure: IRRIGATION AND DEBRIDEMENT FOREARM;  Surgeon: Marlyne Beards, MD;  Location: [facility] OR;  Service: Orthopedics;  Laterality: Left;   NO PAST SURGERIES       Social History:   reports that he has been smoking cigarettes. He has never used smokeless tobacco. He reports current alcohol use. He reports current drug use. Drugs: Marijuana, Heroin, Cocaine, Methamphetamines, and Morphine.   Family History:  His family history  includes Other in his father; Psychiatric Illness in his father.   Allergies No Known Allergies   Home Medications  Prior to Admission medications   Medication Sig Start Date End Date Taking? Authorizing Provider  escitalopram (LEXAPRO) 10 MG tablet Take 1 tablet (10 mg total) by mouth daily. Patient not taking: Reported on 05/23/2022 02/14/22 06/29/22  Comer Locket, MD  folic acid (FOLVITE) 1 MG tablet Take 1 tablet (1 mg total) by mouth daily. Patient not taking: Reported on 02/27/2022 02/23/22   Joycelyn Das, MD  gabapentin (NEURONTIN) 100 MG capsule Take 1 capsule (100 mg total) by mouth 3 (three) times daily. Patient not taking: Reported on 05/23/2022 02/14/22 06/29/22  Comer Locket, MD  hydrOXYzine (ATARAX) 25 MG tablet Take 1 tablet (25 mg total) by  mouth every 6 (six) hours as needed for anxiety. Patient not taking: Reported on 02/27/2022 02/22/22   Joycelyn Das, MD  loperamide (IMODIUM) 2 MG capsule Take 1 capsule (2 mg total) by mouth as needed for diarrhea or loose stools (diarrhea). Patient not taking: Reported on 02/27/2022 02/22/22   Joycelyn Das, MD  methocarbamol (ROBAXIN) 500 MG tablet Take 1 tablet (500 mg total) by mouth every 8 (eight) hours as needed for muscle spasms. Patient not taking: Reported on 02/27/2022 02/22/22   Joycelyn Das, MD  Multiple Vitamin (MULTIVITAMIN WITH MINERALS) TABS tablet Take 1 tablet by mouth daily. Patient not taking: Reported on 02/27/2022 02/23/22   Joycelyn Das, MD  naproxen (NAPROSYN) 500 MG tablet Take 1 tablet (500 mg total) by mouth 2 (two) times daily as needed (aching, pain, or discomfort). Patient not taking: Reported on 02/27/2022 02/22/22   Joycelyn Das, MD  pantoprazole (PROTONIX) 40 MG tablet Take 1 tablet (40 mg total) by mouth daily. Patient not taking: Reported on 05/10/2022 01/23/22   Karsten Ro, MD  QUEtiapine (SEROQUEL) 100 MG tablet Take 1 tablet (100 mg total) by mouth at bedtime. Patient not taking: Reported on 05/23/2022  02/14/22 06/29/22  Comer Locket, MD  thiamine 100 MG tablet Take 1 tablet (100 mg total) by mouth daily. Patient not taking: Reported on 05/23/2022 02/23/22   Joycelyn Das, MD     Critical care time:    CRITICAL CARE Performed by: Karren Burly   Total critical care time: 31 minutes  Critical care time was exclusive of separately billable procedures and treating other patients.  Critical care was necessary to treat or prevent imminent or life-threatening deterioration.  Critical care was time spent personally by me on the following activities: development of treatment plan with patient and/or surrogate as well as nursing, discussions with consultants, evaluation of patient's response to treatment, examination of patient, obtaining history from patient or surrogate, ordering and performing treatments and interventions, ordering and review of laboratory studies, ordering and review of radiographic studies, pulse oximetry and re-evaluation of patient's condition.  Karren Burly, MD See Loretha Stapler for contact info

## 2023-06-08 NOTE — Discharge Instructions (Addendum)
Stop using illicit drugs

## 2023-06-08 NOTE — ED Provider Notes (Signed)
EMERGENCY DEPARTMENT AT [facility] Provider Note   CSN: [id number] Arrival date & time: 06/08/23  0348     History  Chief Complaint  Patient presents with   Drug Problem    Tristan [name] is a 37 y.o. male.  Level 5 caveat for altered mental status and drug use.  Patient brought in by EMS after being found in the woods altered.  EMS reports he had pinpoint pupils and was stumbling around.  He fell to the ground striking the left side of his head.  He became less responsive and route for EMS and was given Narcan with some response.  He is now agitated with involuntary movements of his upper and lower extremities.  Unable to give a history.  Does have a history of IV drug abuse.  He denies any pain. Denies any head, neck, back, chest or abdominal pain.  Does have abrasion to left scalp and forehead. States he may have used heroin tonight. \Unclear why patient was wandering around in the woods.  The history is provided by the patient and the nursing home. The history is limited by the condition of the patient.  Drug Problem       Home Medications Prior to Admission medications   Medication Sig Start Date End Date Taking? Authorizing Provider  escitalopram (LEXAPRO) 10 MG tablet Take 1 tablet (10 mg total) by mouth daily. Patient not taking: Reported on 05/23/2022 02/14/22 06/29/22  Comer Locket, MD  folic acid (FOLVITE) 1 MG tablet Take 1 tablet (1 mg total) by mouth daily. Patient not taking: Reported on 02/27/2022 02/23/22   Joycelyn Das, MD  gabapentin (NEURONTIN) 100 MG capsule Take 1 capsule (100 mg total) by mouth 3 (three) times daily. Patient not taking: Reported on 05/23/2022 02/14/22 06/29/22  Comer Locket, MD  hydrOXYzine (ATARAX) 25 MG tablet Take 1 tablet (25 mg total) by mouth every 6 (six) hours as needed for anxiety. Patient not taking: Reported on 02/27/2022 02/22/22   Joycelyn Das, MD  loperamide (IMODIUM) 2 MG capsule Take 1  capsule (2 mg total) by mouth as needed for diarrhea or loose stools (diarrhea). Patient not taking: Reported on 02/27/2022 02/22/22   Joycelyn Das, MD  methocarbamol (ROBAXIN) 500 MG tablet Take 1 tablet (500 mg total) by mouth every 8 (eight) hours as needed for muscle spasms. Patient not taking: Reported on 02/27/2022 02/22/22   Joycelyn Das, MD  Multiple Vitamin (MULTIVITAMIN WITH MINERALS) TABS tablet Take 1 tablet by mouth daily. Patient not taking: Reported on 02/27/2022 02/23/22   Joycelyn Das, MD  naproxen (NAPROSYN) 500 MG tablet Take 1 tablet (500 mg total) by mouth 2 (two) times daily as needed (aching, pain, or discomfort). Patient not taking: Reported on 02/27/2022 02/22/22   Joycelyn Das, MD  pantoprazole (PROTONIX) 40 MG tablet Take 1 tablet (40 mg total) by mouth daily. Patient not taking: Reported on 05/10/2022 01/23/22   Karsten Ro, MD  QUEtiapine (SEROQUEL) 100 MG tablet Take 1 tablet (100 mg total) by mouth at bedtime. Patient not taking: Reported on 05/23/2022 02/14/22 06/29/22  Comer Locket, MD  thiamine 100 MG tablet Take 1 tablet (100 mg total) by mouth daily. Patient not taking: Reported on 05/23/2022 02/23/22   Joycelyn Das, MD      Allergies    Patient has no known allergies.    Review of Systems   Review of Systems  Unable to perform ROS: Psychiatric disorder    Physical Exam  Updated Vital Signs BP (!) 103/59   Pulse 88   Temp 98.4 F (36.9 C)   Resp 10   SpO2 97%  Physical Exam Vitals and nursing note reviewed.  Constitutional:      General: He is not in acute distress.    Appearance: He is well-developed.     Comments: Somnolent, arouses to voice and mumbles a few words.  Able to state his name.  HENT:     Head: Normocephalic.     Comments:  Abrasion left scalp    Mouth/Throat:     Pharynx: No oropharyngeal exudate.  Eyes:     Conjunctiva/sclera: Conjunctivae normal.     Pupils: Pupils are equal, round, and reactive to light.  Neck:      Comments: C-collar in place Cardiovascular:     Rate and Rhythm: Normal rate and regular rhythm.     Heart sounds: Normal heart sounds. No murmur heard. Pulmonary:     Effort: Pulmonary effort is normal. No respiratory distress.     Breath sounds: Normal breath sounds.  Abdominal:     Palpations: Abdomen is soft.     Tenderness: There is no abdominal tenderness. There is no guarding or rebound.  Musculoskeletal:        General: No tenderness. Normal range of motion.     Cervical back: Normal range of motion and neck supple.  Skin:    General: Skin is warm.  Neurological:     Mental Status: He is alert.     Cranial Nerves: No cranial nerve deficit.     Motor: No abnormal muscle tone.     Coordination: Coordination normal.     Comments: Unable to cooperate for formal neurological testing.  Moves all extremities equally.  Seems fidgety and has difficulty holding still.  No clonus.  Psychiatric:        Behavior: Behavior normal.     ED Results / Procedures / Treatments   Labs (all labs ordered are listed, but only abnormal results are displayed) Labs Reviewed  CBC WITH DIFFERENTIAL/PLATELET - Abnormal; Notable for the following components:      Result Value   RBC 3.96 (*)    Hemoglobin 10.3 (*)    HCT 30.7 (*)    MCV 77.5 (*)    All other components within normal limits  ACETAMINOPHEN LEVEL - Abnormal; Notable for the following components:   Acetaminophen (Tylenol), Serum <10 (*)    All other components within normal limits  SALICYLATE LEVEL - Abnormal; Notable for the following components:   Salicylate Lvl <7.0 (*)    All other components within normal limits  CK - Abnormal; Notable for the following components:   Total CK 427 (*)    All other components within normal limits  BLOOD GAS, ARTERIAL - Abnormal; Notable for the following components:   pCO2 arterial 53 (*)    Bicarbonate 32.1 (*)    Acid-Base Excess 6.0 (*)    All other components within normal limits   COMPREHENSIVE METABOLIC PANEL  ETHANOL  LACTIC ACID, PLASMA  RAPID URINE DRUG SCREEN, HOSP PERFORMED  URINALYSIS, ROUTINE W REFLEX MICROSCOPIC  LACTIC ACID, PLASMA  TROPONIN I (HIGH SENSITIVITY)  TROPONIN I (HIGH SENSITIVITY)    EKG None  Radiology CT Head Wo Contrast  Result Date: 06/08/2023 CLINICAL DATA:  Neck trauma. Intoxication/subtended. Found in woods. Moderate to severe head trauma. EXAM: CT HEAD WITHOUT CONTRAST CT CERVICAL SPINE WITHOUT CONTRAST TECHNIQUE: Multidetector CT imaging of the head and cervical  spine was performed following the standard protocol without intravenous contrast. Multiplanar CT image reconstructions of the cervical spine were also generated. RADIATION DOSE REDUCTION: This exam was performed according to the departmental dose-optimization program which includes automated exposure control, adjustment of the mA and/or kV according to patient size and/or use of iterative reconstruction technique. COMPARISON:  03/05/2022 FINDINGS: CT HEAD FINDINGS Brain: No evidence of acute infarction, hemorrhage, hydrocephalus, extra-axial collection or mass lesion/mass effect. Vascular: No hyperdense vessel or unexpected calcification. Skull: Normal. Negative for fracture or focal lesion. Sinuses/Orbits: No evidence of injury CT CERVICAL SPINE FINDINGS Alignment: Normal. Skull base and vertebrae: No acute fracture. No primary bone lesion or focal pathologic process. Soft tissues and spinal canal: No prevertebral fluid or swelling. No visible canal hematoma. Disc levels:  No significant degenerative change Upper chest: Paraseptal emphysema.  No evidence of injury. IMPRESSION: No evidence of intracranial or cervical spine injury. Electronically Signed   By: Tiburcio Pea M.D.   On: 06/08/2023 06:01   CT Cervical Spine Wo Contrast  Result Date: 06/08/2023 CLINICAL DATA:  Neck trauma. Intoxication/subtended. Found in woods. Moderate to severe head trauma. EXAM: CT HEAD WITHOUT  CONTRAST CT CERVICAL SPINE WITHOUT CONTRAST TECHNIQUE: Multidetector CT imaging of the head and cervical spine was performed following the standard protocol without intravenous contrast. Multiplanar CT image reconstructions of the cervical spine were also generated. RADIATION DOSE REDUCTION: This exam was performed according to the departmental dose-optimization program which includes automated exposure control, adjustment of the mA and/or kV according to patient size and/or use of iterative reconstruction technique. COMPARISON:  03/05/2022 FINDINGS: CT HEAD FINDINGS Brain: No evidence of acute infarction, hemorrhage, hydrocephalus, extra-axial collection or mass lesion/mass effect. Vascular: No hyperdense vessel or unexpected calcification. Skull: Normal. Negative for fracture or focal lesion. Sinuses/Orbits: No evidence of injury CT CERVICAL SPINE FINDINGS Alignment: Normal. Skull base and vertebrae: No acute fracture. No primary bone lesion or focal pathologic process. Soft tissues and spinal canal: No prevertebral fluid or swelling. No visible canal hematoma. Disc levels:  No significant degenerative change Upper chest: Paraseptal emphysema.  No evidence of injury. IMPRESSION: No evidence of intracranial or cervical spine injury. Electronically Signed   By: Tiburcio Pea M.D.   On: 06/08/2023 06:01   DG Chest Portable 1 View  Result Date: 06/08/2023 CLINICAL DATA:  Altered mental status EXAM: PORTABLE CHEST 1 VIEW COMPARISON:  11/20/2022 FINDINGS: Low volume chest with hazy density at the bases, greater on the left. No visible effusion or air leak. Normal cardiomediastinal contours for technique IMPRESSION: Hazy density at the bases is likely atelectasis in the setting of low lung volumes. Cannot exclude aspiration or pneumonia in the appropriate clinical setting. Electronically Signed   By: Tiburcio Pea M.D.   On: 06/08/2023 05:45    Procedures Procedures    Medications Ordered in ED Medications   LORazepam (ATIVAN) injection 2 mg (has no administration in time range)  benztropine mesylate (COGENTIN) injection 2 mg (has no administration in time range)  lactated ringers bolus 1,000 mL (has no administration in time range)    ED Course/ Medical Decision Making/ A&P                                 Medical Decision Making Amount and/or Complexity of Data Reviewed Independent Historian: EMS Labs: ordered. Decision-making details documented in ED Course. Radiology: ordered and independent interpretation performed. Decision-making details documented in ED Course. ECG/medicine  tests: ordered and independent interpretation performed. Decision-making details documented in ED Course.  Risk Prescription drug management.   Altered mental status likely secondary to drug use.  Did require Narcan for EMS.  On arrival patient is somnolent but arousable and breathing spontaneously though hypoxic on room air.  Will give supplemental oxygen.  EKG, labs, IV access, CT head, chest x-ray  Patient is intermittently agitated and thrashing around the bed.  He is given Ativan as well as Cogentin.  IV fluids are running.  Breathing spontaneously without further need for Narcan.  Labs are reassuring with normal anion gap.  Negative ethanol, salicylate and acetaminophen level.  Patient remains somnolent after Ativan.  Labs are reassuring.  LFTs are normal.  Normal anion gap.  \  CT head and C-spine negative for acute traumatic injury.  Chest x-ray with atelectasis.  Did require oxygen supplementation on arrival.  Will to metabolize is likely drug intoxication.  Does admit to injecting heroin earlier.  Care transferred at shift change pending sobriety and amatory trial.  Will need to be reassessed for suicidal ideation.  ED ECG REPORT   Date: 06/08/2023  Rate: 100  Rhythm: sinus tachycardia  QRS Axis: normal  Intervals: normal  ST/T Wave abnormalities: normal  Conduction Disutrbances:none   Narrative Interpretation:   Old EKG Reviewed: none available  I have personally reviewed the EKG tracing and agree with the computerized printout as noted.         Final Clinical Impression(s) / ED Diagnoses Final diagnoses:  None    Rx / DC Orders ED Discharge Orders     None         Alexei Doswell, Jeannett Senior, MD 06/08/23 [phone]

## 2023-06-08 NOTE — ED Triage Notes (Signed)
EMS pt was trashing around in the wood, wondering around .  .4 narcan

## 2023-06-08 NOTE — ED Notes (Signed)
Food and nutrition given to patient

## 2023-06-08 NOTE — ED Notes (Signed)
ED TO INPATIENT HANDOFF REPORT  ED Nurse Name and Phone #:   Tia, RN  S Name/Age/Gender Tristan [name] 37 y.o. male Room/Bed: RESA/RESA  Code Status   Code Status: Full Code  Home/SNF/Other Home Patient oriented to: self, place, time, and situation Is this baseline? Yes   Triage Complete: Triage complete  Chief Complaint Toxic encephalopathy [G92.9]  Triage Note EMS pt was trashing around in the wood, wondering around .  .4 narcan   Allergies No Known Allergies  Level of Care/Admitting Diagnosis ED Disposition     ED Disposition  Admit   Condition  --   Comment  Hospital Area: [facility] [[id number]]  Level of Care: ICU [6]  May place patient in observation at [facility] or [facility] if equivalent level of care is available:: No  Covid Evaluation: Asymptomatic - no recent exposure (last 10 days) testing not required  Diagnosis: Toxic encephalopathy [349.82.ICD-9-CM]  Admitting Physician: Karren Burly [[id number]]  Attending Physician: Karren Burly [phone]          B Medical/Surgery History Past Medical History:  Diagnosis Date   Bipolar 1 disorder (HCC)    Hepatitis C    Hypertension    PTSD (post-traumatic stress disorder)    Past Surgical History:  Procedure Laterality Date   I & D EXTREMITY Left 05/11/2022   Procedure: IRRIGATION AND DEBRIDEMENT FOREARM;  Surgeon: Marlyne Beards, MD;  Location: [facility] OR;  Service: Orthopedics;  Laterality: Left;   NO PAST SURGERIES       A IV Location/Drains/Wounds Patient Lines/Drains/Airways Status     Active Line/Drains/Airways     Name Placement date Placement time Site Days   Peripheral IV 06/08/23 20 G 1" Anterior;Distal;Right;Upper Arm 06/08/23  0450  Arm  less than 1            Intake/Output Last 24 hours No intake or output data in the 24 hours ending 06/08/23 1721  Labs/Imaging Results for orders placed or performed during the hospital encounter  of 06/08/23 (from the past 48 hour(s))  CBC with Differential     Status: Abnormal   Collection Time: 06/08/23  4:29 AM  Result Value Ref Range   WBC 9.5 4.0 - 10.5 K/uL   RBC 3.96 (L) 4.22 - 5.81 MIL/uL   Hemoglobin 10.3 (L) 13.0 - 17.0 g/dL   HCT 60.1 (L) 09.3 - 23.5 %   MCV 77.5 (L) 80.0 - 100.0 fL   MCH 26.0 26.0 - 34.0 pg   MCHC 33.6 30.0 - 36.0 g/dL   RDW 57.3 22.0 - 25.4 %   Platelets 359 150 - 400 K/uL   nRBC 0.0 0.0 - 0.2 %   Neutrophils Relative % 75 %   Neutro Abs 7.1 1.7 - 7.7 K/uL   Lymphocytes Relative 14 %   Lymphs Abs 1.3 0.7 - 4.0 K/uL   Monocytes Relative 5 %   Monocytes Absolute 0.5 0.1 - 1.0 K/uL   Eosinophils Relative 5 %   Eosinophils Absolute 0.5 0.0 - 0.5 K/uL   Basophils Relative 1 %   Basophils Absolute 0.1 0.0 - 0.1 K/uL   Immature Granulocytes 0 %   Abs Immature Granulocytes 0.04 0.00 - 0.07 K/uL    Comment: Performed at [facility], [address], [place], [place] [address]  Comprehensive metabolic panel     Status: None   Collection Time: 06/08/23  4:29 AM  Result Value Ref Range   Sodium 138 135 -  145 mmol/L   Potassium 3.9 3.5 - 5.1 mmol/L   Chloride 103 98 - 111 mmol/L   CO2 27 22 - 32 mmol/L   Glucose, Bld 89 70 - 99 mg/dL    Comment: Glucose reference range applies only to samples taken after fasting for at least 8 hours.   BUN 17 6 - 20 mg/dL   Creatinine, Ser 4.25 0.61 - 1.24 mg/dL   Calcium 9.3 8.9 - 95.6 mg/dL   Total Protein 6.9 6.5 - 8.1 g/dL   Albumin 3.7 3.5 - 5.0 g/dL   AST 27 15 - 41 U/L   ALT 14 0 - 44 U/L   Alkaline Phosphatase 60 38 - 126 U/L   Total Bilirubin 0.6 0.3 - 1.2 mg/dL   GFR, Estimated >38 >75 mL/min    Comment: (NOTE) Calculated using the CKD-EPI Creatinine Equation (2021)    Anion gap 8 5 - 15    Comment: Performed at [facility], [address] [place], [place] [address]  Troponin I (High Sensitivity)     Status: None   Collection Time: 06/08/23  4:29 AM  Result  Value Ref Range   Troponin I (High Sensitivity) 5 <18 ng/L    Comment: (NOTE) Elevated high sensitivity troponin I (hsTnI) values and significant  changes across serial measurements may suggest ACS but many other  chronic and acute conditions are known to elevate hsTnI results.  Refer to the "Links" section for chest pain algorithms and additional  guidance. Performed at [facility], [address], [place], [place] [address]   Acetaminophen level     Status: Abnormal   Collection Time: 06/08/23  4:29 AM  Result Value Ref Range   Acetaminophen (Tylenol), Serum <10 (L) 10 - 30 ug/mL    Comment: (NOTE) Therapeutic concentrations vary significantly. A range of 10-30 ug/mL  may be an effective concentration for many patients. However, some  are best treated at concentrations outside of this range. Acetaminophen concentrations >150 ug/mL at 4 hours after ingestion  and >50 ug/mL at 12 hours after ingestion are often associated with  toxic reactions.  Performed at [facility], [address], [place], [place] [address]   Salicylate level     Status: Abnormal   Collection Time: 06/08/23  4:29 AM  Result Value Ref Range   Salicylate Lvl <7.0 (L) 7.0 - 30.0 mg/dL    Comment: Performed at [facility], [address] [place], [place] [address]  Ethanol     Status: None   Collection Time: 06/08/23  4:29 AM  Result Value Ref Range   Alcohol, Ethyl (B) <10 <10 mg/dL    Comment: (NOTE) Lowest detectable limit for serum alcohol is 10 mg/dL.  For medical purposes only. Performed at [facility], [address] [place], [place] [address]   CK     Status: Abnormal   Collection Time: 06/08/23  4:29 AM  Result Value Ref Range   Total CK 427 (H) 49 - 397 U/L    Comment: Performed at [facility], [address], [place], [place] [address]  Lactic acid, plasma     Status: None   Collection  Time: 06/08/23  4:30 AM  Result Value Ref Range   Lactic Acid, Venous 1.8 0.5 - 1.9 mmol/L    Comment: Performed at [facility], [address] [place], [place] [address]  Blood gas, arterial (at [facility])  Status: Abnormal   Collection Time: 06/08/23  4:30 AM  Result Value Ref Range   pH, Arterial 7.39 7.35 - 7.45   pCO2 arterial 53 (H) 32 - 48 mmHg   pO2, Arterial 90 83 - 108 mmHg   Bicarbonate 32.1 (H) 20.0 - 28.0 mmol/L   Acid-Base Excess 6.0 (H) 0.0 - 2.0 mmol/L   O2 Saturation 99.1 %   Patient temperature 36.9    Collection site LEFT RADIAL    Drawn by [id number]     Comment: Performed at [facility], [address], [place], [place] [address]  Lactic acid, plasma     Status: None   Collection Time: 06/08/23  6:30 AM  Result Value Ref Range   Lactic Acid, Venous 1.6 0.5 - 1.9 mmol/L    Comment: Performed at [facility], [address], [place], [place] [address]  Troponin I (High Sensitivity)     Status: None   Collection Time: 06/08/23  6:30 AM  Result Value Ref Range   Troponin I (High Sensitivity) 7 <18 ng/L    Comment: (NOTE) Elevated high sensitivity troponin I (hsTnI) values and significant  changes across serial measurements may suggest ACS but many other  chronic and acute conditions are known to elevate hsTnI results.  Refer to the "Links" section for chest pain algorithms and additional  guidance. Performed at [facility], [address] [place], [place] [address]   CBG monitoring, ED     Status: Abnormal   Collection Time: 06/08/23  1:46 PM  Result Value Ref Range   Glucose-Capillary 69 (L) 70 - 99 mg/dL    Comment: Glucose reference range applies only to samples taken after fasting for at least 8 hours.  Rapid urine drug screen (hospital performed)     Status: Abnormal   Collection Time: 06/08/23  3:40 PM  Result Value Ref Range   Opiates POSITIVE (A) NONE DETECTED   Cocaine  POSITIVE (A) NONE DETECTED   Benzodiazepines NONE DETECTED NONE DETECTED   Amphetamines POSITIVE (A) NONE DETECTED   Tetrahydrocannabinol POSITIVE (A) NONE DETECTED   Barbiturates NONE DETECTED NONE DETECTED    Comment: (NOTE) DRUG SCREEN FOR MEDICAL PURPOSES ONLY.  IF CONFIRMATION IS NEEDED FOR ANY PURPOSE, NOTIFY LAB WITHIN 5 DAYS.  LOWEST DETECTABLE LIMITS FOR URINE DRUG SCREEN Drug Class                     Cutoff (ng/mL) Amphetamine and metabolites    1000 Barbiturate and metabolites    200 Benzodiazepine                 200 Opiates and metabolites        300 Cocaine and metabolites        300 THC                            50 Performed at [facility], [address] [place], [place] [address]   Urinalysis, Routine w reflex microscopic -Urine, Clean Catch     Status: None   Collection Time: 06/08/23  3:40 PM  Result Value Ref Range   Color, Urine YELLOW YELLOW   APPearance CLEAR CLEAR   Specific Gravity, Urine 1.023 1.005 - 1.030   pH 6.0 5.0 - 8.0   Glucose, UA NEGATIVE NEGATIVE mg/dL   Hgb urine dipstick NEGATIVE NEGATIVE   Bilirubin Urine NEGATIVE NEGATIVE  Ketones, ur NEGATIVE NEGATIVE mg/dL   Protein, ur NEGATIVE NEGATIVE mg/dL   Nitrite NEGATIVE NEGATIVE   Leukocytes,Ua NEGATIVE NEGATIVE    Comment: Performed at [facility], [address], [place], [place] [address]   CT Head Wo Contrast  Result Date: 06/08/2023 CLINICAL DATA:  Neck trauma. Intoxication/subtended. Found in woods. Moderate to severe head trauma. EXAM: CT HEAD WITHOUT CONTRAST CT CERVICAL SPINE WITHOUT CONTRAST TECHNIQUE: Multidetector CT imaging of the head and cervical spine was performed following the standard protocol without intravenous contrast. Multiplanar CT image reconstructions of the cervical spine were also generated. RADIATION DOSE REDUCTION: This exam was performed according to the departmental dose-optimization program which includes automated  exposure control, adjustment of the mA and/or kV according to patient size and/or use of iterative reconstruction technique. COMPARISON:  03/05/2022 FINDINGS: CT HEAD FINDINGS Brain: No evidence of acute infarction, hemorrhage, hydrocephalus, extra-axial collection or mass lesion/mass effect. Vascular: No hyperdense vessel or unexpected calcification. Skull: Normal. Negative for fracture or focal lesion. Sinuses/Orbits: No evidence of injury CT CERVICAL SPINE FINDINGS Alignment: Normal. Skull base and vertebrae: No acute fracture. No primary bone lesion or focal pathologic process. Soft tissues and spinal canal: No prevertebral fluid or swelling. No visible canal hematoma. Disc levels:  No significant degenerative change Upper chest: Paraseptal emphysema.  No evidence of injury. IMPRESSION: No evidence of intracranial or cervical spine injury. Electronically Signed   By: Tiburcio Pea M.D.   On: 06/08/2023 06:01   CT Cervical Spine Wo Contrast  Result Date: 06/08/2023 CLINICAL DATA:  Neck trauma. Intoxication/subtended. Found in woods. Moderate to severe head trauma. EXAM: CT HEAD WITHOUT CONTRAST CT CERVICAL SPINE WITHOUT CONTRAST TECHNIQUE: Multidetector CT imaging of the head and cervical spine was performed following the standard protocol without intravenous contrast. Multiplanar CT image reconstructions of the cervical spine were also generated. RADIATION DOSE REDUCTION: This exam was performed according to the departmental dose-optimization program which includes automated exposure control, adjustment of the mA and/or kV according to patient size and/or use of iterative reconstruction technique. COMPARISON:  03/05/2022 FINDINGS: CT HEAD FINDINGS Brain: No evidence of acute infarction, hemorrhage, hydrocephalus, extra-axial collection or mass lesion/mass effect. Vascular: No hyperdense vessel or unexpected calcification. Skull: Normal. Negative for fracture or focal lesion. Sinuses/Orbits: No evidence of  injury CT CERVICAL SPINE FINDINGS Alignment: Normal. Skull base and vertebrae: No acute fracture. No primary bone lesion or focal pathologic process. Soft tissues and spinal canal: No prevertebral fluid or swelling. No visible canal hematoma. Disc levels:  No significant degenerative change Upper chest: Paraseptal emphysema.  No evidence of injury. IMPRESSION: No evidence of intracranial or cervical spine injury. Electronically Signed   By: Tiburcio Pea M.D.   On: 06/08/2023 06:01   DG Chest Portable 1 View  Result Date: 06/08/2023 CLINICAL DATA:  Altered mental status EXAM: PORTABLE CHEST 1 VIEW COMPARISON:  11/20/2022 FINDINGS: Low volume chest with hazy density at the bases, greater on the left. No visible effusion or air leak. Normal cardiomediastinal contours for technique IMPRESSION: Hazy density at the bases is likely atelectasis in the setting of low lung volumes. Cannot exclude aspiration or pneumonia in the appropriate clinical setting. Electronically Signed   By: Tiburcio Pea M.D.   On: 06/08/2023 05:45    Pending Labs Unresulted Labs (From admission, onward)     Start     Ordered   06/15/23 0500  Creatinine, serum  (enoxaparin (LOVENOX)    CrCl >/= 30 ml/min)  Weekly,   R  Comments: while on enoxaparin therapy    06/08/23 1708   06/08/23 1709  Hepatitis panel, acute  Once,   R        06/08/23 1708   06/08/23 1708  Hemoglobin A1c  (Glycemic Control (SSI)  Q 4 Hours / Glycemic Control (SSI)  AC +/- HS)  Once,   R       Comments: To assess prior glycemic control    06/08/23 1708   06/08/23 1707  HIV Antibody (routine testing w rflx)  (HIV Antibody (Routine testing w reflex) panel)  Once,   R        06/08/23 1708   06/08/23 1707  CBC  (enoxaparin (LOVENOX)    CrCl >/= 30 ml/min)  Once,   R       Comments: Baseline for enoxaparin therapy IF NOT ALREADY DRAWN.  Notify MD if PLT < 100 K.    06/08/23 1708   06/08/23 1707  Creatinine, serum  (enoxaparin (LOVENOX)    CrCl >/= 30  ml/min)  Once,   R       Comments: Baseline for enoxaparin therapy IF NOT ALREADY DRAWN.    06/08/23 1708            Vitals/Pain Today's Vitals   06/08/23 1230 06/08/23 1311 06/08/23 1445 06/08/23 1617  BP: 105/74  127/89   Pulse: (!) 59  87   Resp: 11  18   Temp:  97.7 F (36.5 C)    TempSrc:  Oral    SpO2: 90%  100%   PainSc:    Asleep    Isolation Precautions No active isolations  Medications Medications  naloxone HCl (NARCAN) 4 mg in dextrose 5 % 250 mL infusion (0.4 mg/hr Intravenous New Bag/Given 06/08/23 1608)  docusate sodium (COLACE) capsule 100 mg (has no administration in time range)  polyethylene glycol (MIRALAX / GLYCOLAX) packet 17 g (has no administration in time range)  enoxaparin (LOVENOX) injection 40 mg (has no administration in time range)  lactated ringers infusion (has no administration in time range)  acetaminophen (TYLENOL) tablet 650 mg (has no administration in time range)  ondansetron (ZOFRAN) injection 4 mg (has no administration in time range)  insulin aspart (novoLOG) injection 0-9 Units (has no administration in time range)  LORazepam (ATIVAN) injection 2 mg (2 mg Intramuscular Given 06/08/23 0422)  benztropine mesylate (COGENTIN) injection 2 mg (2 mg Intramuscular Given 06/08/23 0438)  lactated ringers bolus 1,000 mL (0 mLs Intravenous Stopped 06/08/23 0846)  lactated ringers bolus 1,000 mL (0 mLs Intravenous Stopped 06/08/23 0846)  naloxone (NARCAN) 0.4 MG/ML injection (0.2 mg/mL  Given 06/08/23 1442)    Mobility walks     Focused Assessments Neuro Assessment Handoff:  Swallow screen pass? Yes          Neuro Assessment:   Neuro Checks:      Has TPA been given? No If patient is a Neuro Trauma and patient is going to OR before floor call report to 4N Charge nurse: [phone] or [phone]   R Recommendations: See Admitting Provider Note  Report given to:   Additional Notes:

## 2023-06-08 NOTE — Plan of Care (Signed)
  Problem: Clinical Measurements: Goal: Respiratory complications will improve Outcome: Progressing Goal: Cardiovascular complication will be avoided Outcome: Progressing   Problem: Nutrition: Goal: Adequate nutrition will be maintained Outcome: Progressing   Problem: Coping: Goal: Level of anxiety will decrease Outcome: Progressing   Problem: Clinical Measurements: Goal: Diagnostic test results will improve Outcome: Not Progressing

## 2023-06-08 NOTE — ED Provider Notes (Signed)
  Physical Exam  BP (!) 103/59   Pulse 88   Temp 98.4 F (36.9 C)   Resp 10   SpO2 97%   Physical Exam  Procedures  Procedures  ED Course / MDM   Clinical Course as of 06/08/23 1616  Sun Jun 08, 2023  0715 Assumed care from Dr. Manus Gunning.  37 year old male with history of opiate abuse who presented to the emergency department after he was found wandering around the woods.  Did fall and hit his head.  Was given Narcan by EMS.  When he arrived he was very agitated and hypoxic on 2 L nasal cannula.  Was given some Ativan to help him calm down.  Still drowsy from the Ativan.  Did admit to heroin use last night.  Traumatic workup has been negative.  Will reassess [RP]  0826 Taken off supplemental oxygen c-collar cleared.  Satting well on room air at this time but still drowsy. [RP]  1602 Dr Theophilus Bones will evaluate the patient and see if he needs to go to the ICU on Narcan drip.  Did trial another 0.4 mg of Narcan with some response. [RP]  1615 Signed out to Dr Denton Lank awaiting evaluation by ICU. [RP]    Clinical Course User Index [RP] Rondel Baton, MD   Medical Decision Making Amount and/or Complexity of Data Reviewed Labs: ordered. Radiology: ordered. ECG/medicine tests: ordered.  Risk Prescription drug management.     Rondel Baton, MD 06/08/23 [phone]

## 2023-06-09 ENCOUNTER — Encounter (HOSPITAL_COMMUNITY): Payer: Self-pay | Admitting: Critical Care Medicine

## 2023-06-09 DIAGNOSIS — Z5900 Homelessness unspecified: Secondary | ICD-10-CM

## 2023-06-09 DIAGNOSIS — B182 Chronic viral hepatitis C: Secondary | ICD-10-CM

## 2023-06-09 DIAGNOSIS — F10221 Alcohol dependence with intoxication delirium: Secondary | ICD-10-CM

## 2023-06-09 DIAGNOSIS — E876 Hypokalemia: Secondary | ICD-10-CM

## 2023-06-09 DIAGNOSIS — F1910 Other psychoactive substance abuse, uncomplicated: Secondary | ICD-10-CM

## 2023-06-09 DIAGNOSIS — T50901A Poisoning by unspecified drugs, medicaments and biological substances, accidental (unintentional), initial encounter: Secondary | ICD-10-CM | POA: Diagnosis present

## 2023-06-09 LAB — GLUCOSE, CAPILLARY
Glucose-Capillary: 103 mg/dL — ABNORMAL HIGH (ref 70–99)
Glucose-Capillary: 108 mg/dL — ABNORMAL HIGH (ref 70–99)
Glucose-Capillary: 100 mg/dL — ABNORMAL HIGH (ref 70–99)
Glucose-Capillary: 98 mg/dL (ref 70–99)
Glucose-Capillary: 93 mg/dL (ref 70–99)
Glucose-Capillary: 89 mg/dL (ref 70–99)
Glucose-Capillary: 90 mg/dL (ref 70–99)

## 2023-06-09 LAB — HEPATITIS PANEL, ACUTE
HCV Ab: REACTIVE — AB
Hep A IgM: NONREACTIVE
Hep B C IgM: NONREACTIVE
Hepatitis B Surface Ag: NONREACTIVE

## 2023-06-09 LAB — IRON AND TIBC
Iron: 68 ug/dL (ref 45–182)
Saturation Ratios: 21 % (ref 17.9–39.5)
TIBC: 321 ug/dL (ref 250–450)
UIBC: 253 ug/dL

## 2023-06-09 LAB — RETICULOCYTES
Immature Retic Fract: 13.6 % (ref 2.3–15.9)
RBC.: 4.27 MIL/uL (ref 4.22–5.81)
Retic Count, Absolute: 34.6 10*3/uL (ref 19.0–186.0)
Retic Ct Pct: 0.8 % (ref 0.4–3.1)

## 2023-06-09 LAB — BLOOD GAS, VENOUS
Acid-Base Excess: 1.7 mmol/L (ref 0.0–2.0)
Bicarbonate: 26.6 mmol/L (ref 20.0–28.0)
Drawn by: 7020
O2 Saturation: 88.5 %
Patient temperature: 37
pCO2, Ven: 42 mmHg — ABNORMAL LOW (ref 44–60)
pH, Ven: 7.41 (ref 7.25–7.43)
pO2, Ven: 54 mmHg — ABNORMAL HIGH (ref 32–45)

## 2023-06-09 LAB — BASIC METABOLIC PANEL
Anion gap: 7 (ref 5–15)
BUN: 10 mg/dL (ref 6–20)
CO2: 23 mmol/L (ref 22–32)
Calcium: 8.6 mg/dL — ABNORMAL LOW (ref 8.9–10.3)
Chloride: 104 mmol/L (ref 98–111)
Creatinine, Ser: 0.81 mg/dL (ref 0.61–1.24)
GFR, Estimated: >60 mL/min (ref >60)
Glucose, Bld: 113 mg/dL — ABNORMAL HIGH (ref 70–99)
Potassium: 3.3 mmol/L — ABNORMAL LOW (ref 3.5–5.1)
Sodium: 134 mmol/L — ABNORMAL LOW (ref 135–145)

## 2023-06-09 LAB — FERRITIN: Ferritin: 29 ng/mL (ref 24–336)

## 2023-06-09 LAB — MAGNESIUM: Magnesium: 2.1 mg/dL (ref 1.7–2.4)

## 2023-06-09 LAB — FOLATE: Folate: 13 ng/mL (ref >5.9)

## 2023-06-09 LAB — VITAMIN B12: Vitamin B-12: 315 pg/mL (ref 180–914)

## 2023-06-09 LAB — HIV ANTIBODY (ROUTINE TESTING W REFLEX): HIV Screen 4th Generation wRfx: REACTIVE — AB

## 2023-06-09 MED ORDER — THIAMINE MONONITRATE 100 MG PO TABS
100.0000 mg | ORAL_TABLET | Freq: Every day | ORAL | Status: DC
  Administered 2023-06-09 – 2023-06-11 (×3): 100 mg via ORAL
  Filled 2023-06-09 (×3): qty 1

## 2023-06-09 MED ORDER — FOLIC ACID 1 MG PO TABS
1.0000 mg | ORAL_TABLET | Freq: Every day | ORAL | Status: DC
  Administered 2023-06-09 – 2023-06-11 (×3): 1 mg via ORAL
  Filled 2023-06-09 (×3): qty 1

## 2023-06-09 MED ORDER — LORAZEPAM 2 MG/ML IJ SOLN: 1.0000 mg | INTRAMUSCULAR | Status: DC | PRN

## 2023-06-09 MED ORDER — THIAMINE HCL 100 MG/ML IJ SOLN
100.0000 mg | Freq: Every day | INTRAMUSCULAR | Status: DC
  Filled 2023-06-09: qty 2

## 2023-06-09 MED ORDER — ADULT MULTIVITAMIN W/MINERALS CH
1.0000 | ORAL_TABLET | Freq: Every day | ORAL | Status: DC
  Administered 2023-06-09 – 2023-06-10 (×2): 1 via ORAL
  Filled 2023-06-09 (×2): qty 1

## 2023-06-09 MED ORDER — LORAZEPAM 1 MG PO TABS: 1.0000 mg | ORAL_TABLET | ORAL | Status: DC | PRN

## 2023-06-09 MED ORDER — POTASSIUM CHLORIDE CRYS ER 20 MEQ PO TBCR
40.0000 meq | EXTENDED_RELEASE_TABLET | ORAL | Status: AC
  Administered 2023-06-09 (×2): 40 meq via ORAL
  Filled 2023-06-09 (×2): qty 2

## 2023-06-09 NOTE — Plan of Care (Signed)
  Problem: Clinical Measurements: Goal: Diagnostic test results will improve Outcome: Progressing   Problem: Education: Goal: Knowledge of General Education information will improve Description: Including pain rating scale, medication(s)/side effects and non-pharmacologic comfort measures Outcome: Not Progressing   Problem: Health Behavior/Discharge Planning: Goal: Ability to manage health-related needs will improve Outcome: Not Progressing

## 2023-06-09 NOTE — Progress Notes (Signed)
Admitted to IVDU earlier today. Prelim HIV +. 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 He does not know of anyone he could have contacted this from, but did not deny sharing needles. He agreed to defer discharge until we can get suboxone clinic set up and get his final HIV test results back. He understands that treatments have dramatically changed the course of HIV and are effective at preventing progression and transmission. ID will see him-- d/w Dr. Daiva Eves.   Steffanie Dunn, DO 06/09/23 3:36 PM [facility]  For contact information, see Amion. If no response to pager, please call PCCM consult pager. After hours, 7PM- 7AM, please call Elink.

## 2023-06-09 NOTE — Progress Notes (Signed)
NAME:  Tristan [name], MRN:  [id number], DOB:  [birth year], LOS: 0 ADMISSION DATE:  06/08/2023, CONSULTATION DATE:  06/08/2023  REFERRING MD:  ED, CHIEF COMPLAINT:  encephalopathy   History of Present Illness:  37 year old male with past medical history of polysubstance abuse, hypertension, hepatitis C, bipolar 1 who presented to the ED on 06/08/2023 for altered mental status. He was found in the woods altered, admitted to possible heroin use. He was initially given Narcan by EMS with minimal response. Work up in the ED with negative CT head imaging. CXR with atelectasis. WBC 10.3, CK 427, UDS +opiates/cocaine/amphetamines/THC. Patient ended up requiring subsequent dose of Narcan with some response. He did require Ativan and Cogentin for agitation. Eventually placed on Narcan drip for ongoing lethargy and PCCM consulted.   Pertinent  Medical History  Polysubstance abuse, hypertension, hepatitis C, bipolar 1 disorder   Significant Hospital Events: Including procedures, antibiotic start and stop dates in addition to other pertinent events   9/15: admit to PCCM from ED on narcan drip 9/16: narcan gtt off at ~0300. Possible to TRH vs discharge today if remains stable off gtt.   Interim History / Subjective:  No complaints this morning. Resting in bed in no acute distress.   Objective   Blood pressure (!) 121/57, pulse 83, temperature 98.2 F (36.8 C), temperature source Axillary, resp. rate 19, SpO2 99%.        Intake/Output Summary (Last 24 hours) at 06/09/2023 1044 Last data filed at 06/09/2023 0900 Gross per 24 hour  Intake 1274.62 ml  Output 2300 ml  Net -1025.38 ml   There were no vitals filed for this visit.  Examination: General: younger male, resting comfortably in bed, no distress HENT: PERRLA, EOM intact, no congestion Lungs: clear bilaterally Cardiovascular: s1/s2 without murmur, rub, gallop Abdomen: soft, non-tender, +BS Extremities: no edema, moves all  extremities well  Neuro: alert, oriented x3  Resolved Hospital Problem list   Toxic encephalopathy Hypoxemia  Hypercarbia  Assessment & Plan:  Hypercarbic respiratory failure: likely related to respiratory depression in the setting of opiate use. Subsequently got Ativan and Cogentin in ED. CO2 normalized.  - Narcan gtt stopped at 0300 - CT head negative  - no liver disease concerning for hyperammonemia  - repeating VBG this AM. Improved mental status.  - supplemental O2 as needed. Currently on room air   Anemia; likely chronic. Low MCV. - send anemia panel this morning to confirm likely IDA  - can consider iron supplementation  - consider OP hematology referral at discharge   Polysubstance abuse: UDS + opiates, amphetamines, cocaine, THC. No evidence of withdrawal on exam  - provide counseling on cessation of drug use when clinically appropriate  - HIV, hepatitis panel pending   Best Practice (right click and "Reselect all SmartList Selections" daily)   Diet/type: Regular consistency (see orders) DVT prophylaxis: LMWH GI prophylaxis: N/A Lines: N/A Foley:  N/A Code Status:  full code Last date of multidisciplinary goals of care discussion [discussed plan of care with him 9/16 AM]  Labs   CBC: Recent Labs  Lab 06/06/23 2037 06/08/23 0429  WBC 8.7 9.5  NEUTROABS 4.8 7.1  HGB 11.5* 10.3*  HCT 34.2* 30.7*  MCV 78.3* 77.5*  PLT 361 359    Basic Metabolic Panel: Recent Labs  Lab 06/06/23 2037 06/08/23 0429 06/09/23 0830  NA 139 138 134*  K 4.2 3.9 3.3*  CL 105 103 104  CO2 27 27 23   GLUCOSE 96 89 113*  BUN 16 17 10   CREATININE 0.95 1.12 0.81  CALCIUM 9.3 9.3 8.6*  MG  --   --  2.1   GFR: Estimated Creatinine Clearance: 128.9 mL/min (by C-G formula based on SCr of 0.81 mg/dL). Recent Labs  Lab 06/06/23 2037 06/08/23 0429 06/08/23 0430 06/08/23 0630  WBC 8.7 9.5  --   --   LATICACIDVEN  --   --  1.8 1.6    Liver Function Tests: Recent Labs  Lab  06/06/23 2037 06/08/23 0429  AST 25 27  ALT 14 14  ALKPHOS 69 60  BILITOT 0.5 0.6  PROT 7.6 6.9  ALBUMIN 4.1 3.7   No results for input(s): "LIPASE", "AMYLASE" in the last 168 hours. No results for input(s): "AMMONIA" in the last 168 hours.  ABG    Component Value Date/Time   PHART 7.39 06/08/2023 0430   PCO2ART 53 (H) 06/08/2023 0430   PO2ART 90 06/08/2023 0430   HCO3 26.6 06/09/2023 0832   TCO2 26 02/18/2022 1613   ACIDBASEDEF 2.0 02/18/2022 1613   O2SAT 88.5 06/09/2023 0832     Coagulation Profile: No results for input(s): "INR", "PROTIME" in the last 168 hours.  Cardiac Enzymes: Recent Labs  Lab 06/08/23 0429  CKTOTAL 427*    HbA1C: Hgb A1c MFr Bld  Date/Time Value Ref Range Status  11/21/2021 07:54 PM 5.1 4.8 - 5.6 % Final    Comment:    (NOTE) Pre diabetes:          5.7%-6.4%  Diabetes:              >6.4%  Glycemic control for   <7.0% adults with diabetes   02/22/2019 06:23 AM 5.0 4.8 - 5.6 % Final    Comment:    (NOTE) Pre diabetes:          5.7%-6.4% Diabetes:              >6.4% Glycemic control for   <7.0% adults with diabetes     CBG: Recent Labs  Lab 06/08/23 1812 06/08/23 2059 06/09/23 0005 06/09/23 0402 06/09/23 0809  GLUCAP 100* 106* 103* 108* 98    Review of Systems:   No complaints   Past Medical History:  He,  has a past medical history of Bipolar 1 disorder (HCC), Hepatitis C, Hypertension, and PTSD (post-traumatic stress disorder).   Surgical History:   Past Surgical History:  Procedure Laterality Date   I & D EXTREMITY Left 05/11/2022   Procedure: IRRIGATION AND DEBRIDEMENT FOREARM;  Surgeon: Marlyne Beards, MD;  Location: [facility] OR;  Service: Orthopedics;  Laterality: Left;   NO PAST SURGERIES       Social History:   reports that he has been smoking cigarettes. He has never used smokeless tobacco. He reports current alcohol use. He reports current drug use. Drugs: Marijuana, Heroin, Cocaine, Methamphetamines, and  Morphine.   Family History:  His family history includes Other in his father; Psychiatric Illness in his father.   Allergies No Known Allergies   Home Medications  Prior to Admission medications   Medication Sig Start Date End Date Taking? Authorizing Provider  folic acid (FOLVITE) 1 MG tablet Take 1 tablet (1 mg total) by mouth daily. Patient not taking: Reported on 02/27/2022 02/23/22   Joycelyn Das, MD     Critical care time: 37

## 2023-06-09 NOTE — Consult Note (Signed)
Date of Admission:  06/08/2023          Reason for Consult:  Preliminary positive HIV test   Referring Provider: [facility] auto consult and Virl Axe. Kemper Durie, MD   Assessment:  Prelim + HIV test Chronic hepatitis C without hepatic coma IV drug abuse with opiates and potentially other substances Bipolar disorder Homelessness Admission to the ICU due to opiate drug overdose  Plan:  I have ordered an HIV RNA quant as well as CD4 count and hepatitis serologies I will also send an HIV genotype with his morning labs and repeat hepatitis C viral load If his HIV status is still not clear as he approaches discharge I would favor starting antiretroviral therapy on the patient which can certainly be discontinued if he turns out to have a false positive Theora Gianotti test.  Note if he is HIV seronegative he would benefit from HIV preexposure prophylaxis.  The patient was in agreement with the idea of starting antiretroviral therapy in the hospital if he was positive clearly or if we still are in the holding pattern of waiting for confirmatory testing We need to make sure that he is enrolled in Medicaid versus H MAP. For enrollment in the latter we will need labs that firm up a formal diagnosis of HIV infection Given his homelessness lack of transportation and lack of a cell phone that works when it is not on Wi-Fi he would benefit from [place] counseling from [employer] though again he will likely need to be established as a true positive patient prior to being able to be engaged with care with them.I will reach out to the current [facility] counselor tomorrow morning   Principal Problem:   Toxic encephalopathy Active Problems:   Overdose   Scheduled Meds:  Chlorhexidine Gluconate Cloth  6 each Topical Daily   enoxaparin (LOVENOX) injection  40 mg Subcutaneous Q24H   folic acid  1 mg Oral Daily   insulin aspart  0-9 Units Subcutaneous Q4H   multivitamin with minerals  1 tablet  Oral Daily   thiamine  100 mg Oral Daily   Or   thiamine  100 mg Intravenous Daily   Continuous Infusions: PRN Meds:.acetaminophen, docusate sodium, LORazepam **OR** LORazepam, ondansetron (ZOFRAN) IV, mouth rinse, polyethylene glycol  HPI: Tristan [name] is a 37 y.o. male whose past medical history is significant for IV drug abuse, chronic hepatitis C without hepatic coma bipolar disorder, hypertension homelessness who presented to the ER on 15 September with altered mental status.  He had been found in the woods confused and having admitted to possible heroin abuse.  He was given Narcan by EMS with minimal response brought to the emergency department for workup of head CT scan was negative chest x-ray showed some atelectasis.  His CPK was born [birth year] and his urine drug screen positive for opiates cocaine and methamphetamines and THC.  He got additional doses of Narcan with some response.  He did receive Ativan and Cogentin as well essentially placed on Narcan drip for ongoing lethargy and admitted to the intensive care unit.  His admission labs have included an HIV fourth-generation test which is preliminarily positive.  Note he tested negative for HIV in July 2023 in our healthcare system though I do not have ability to see if his prelim test was positive at that time.  He does admit to intravenous drug use and sharing needles.  My understanding is he endorses heterosexual sex but  is unaware of anyone in his sexual or drug use "circles" who might have HIV.  He is currently homeless and sleeping on the street and in shelters when he can.  He has responded well to Narcan and was requesting to go home though he would like to get connected to an oral opioid clinic.  We also wanted him to stay in the hospital sufficiently long so that we could connect him to care in our clinic and also provide him with antiretroviral therapy.  Given his history of intravenous drug use with sharing needles  and hepatitis C infection along with demographics of living in the [place] area and being of Black or African-American heritage all puts him at high risk of having HIV and with a high pretest probability for this being a true positive.  I have discussed first-line antiretroviral therapy that I use including BIKTARVY and Dovato.  The former might afford him a benefit in terms of protection against hepatitis B if he is not immune to this virus yet.  Beyond that there are various subtle arguments made on the half of the pharmaceutical companies that manufacture both of these drugs but I consider them both to be highly effective single tablet regimens with potency and ability to rapidly reduce the viral load low side effects and relatively high barrier to genetic resistance.  I have personally spent 86 minutes involved in face-to-face and non-face-to-face activities for this patient on the day of the visit. Professional time spent includes the following activities: Preparing to see the patient (review of tests), Obtaining and/or reviewing separately obtained history (admission/discharge record), Performing a medically appropriate examination and/or evaluation , Ordering medications/tests/procedures, referring and communicating with other health care professionals, Documenting clinical information in the EMR, Independently interpreting results (not separately reported), Communicating results to the patient/family/caregiver, Counseling and educating the patient/family/caregiver and Care coordination (not separately reported).     Review of Systems: Review of Systems  Constitutional:  Negative for chills, fever, malaise/fatigue and weight loss.  HENT:  Negative for congestion and sore throat.   Eyes:  Negative for blurred vision and photophobia.  Respiratory:  Negative for cough, shortness of breath and wheezing.   Cardiovascular:  Negative for chest pain, palpitations and leg swelling.   Gastrointestinal:  Negative for abdominal pain, blood in stool, constipation, diarrhea, heartburn, melena, nausea and vomiting.  Genitourinary:  Negative for dysuria, flank pain and hematuria.  Musculoskeletal:  Negative for back pain, falls, joint pain and myalgias.  Skin:  Negative for itching and rash.  Neurological:  Negative for dizziness, focal weakness, loss of consciousness, weakness and headaches.  Endo/Heme/Allergies:  Does not bruise/bleed easily.  Psychiatric/Behavioral:  Positive for substance abuse. Negative for depression and suicidal ideas. The patient does not have insomnia.     Past Medical History:  Diagnosis Date   Bipolar 1 disorder (HCC)    Hepatitis C    Hypertension    PTSD (post-traumatic stress disorder)     Social History   Tobacco Use   Smoking status: Every Day    Current packs/day: 0.50    Types: Cigarettes   Smokeless tobacco: Never  Vaping Use   Vaping status: Never Used  Substance Use Topics   Alcohol use: Yes    Comment: Pint per day   Drug use: Yes    Types: Marijuana, Heroin, Cocaine, Methamphetamines, Morphine    Comment: denies drug use    Family History  Problem Relation Age of Onset   Other Father  Psychiatric Illness Father    No Known Allergies  OBJECTIVE: Blood pressure (!) 134/59, pulse 83, temperature 98.3 F (36.8 C), temperature source Oral, resp. rate 19, SpO2 99%.  Physical Exam Constitutional:      Appearance: He is well-developed.  HENT:     Head: Normocephalic and atraumatic.  Eyes:     Conjunctiva/sclera: Conjunctivae normal.  Cardiovascular:     Rate and Rhythm: Normal rate and regular rhythm.  Pulmonary:     Effort: Pulmonary effort is normal. No respiratory distress.     Breath sounds: No wheezing.  Abdominal:     General: There is no distension.     Palpations: Abdomen is soft.  Musculoskeletal:        General: No tenderness. Normal range of motion.     Cervical back: Normal range of motion and  neck supple.  Skin:    General: Skin is warm and dry.     Coloration: Skin is not pale.     Findings: No erythema or rash.  Neurological:     General: No focal deficit present.     Mental Status: He is alert and oriented to person, place, and time.  Psychiatric:        Attention and Perception: Attention normal.        Mood and Affect: Mood is depressed.        Behavior: Behavior normal.        Thought Content: Thought content normal.        Judgment: Judgment normal.     Lab Results Lab Results  Component Value Date   WBC 9.5 06/08/2023   HGB 10.3 (L) 06/08/2023   HCT 30.7 (L) 06/08/2023   MCV 77.5 (L) 06/08/2023   PLT 359 06/08/2023    Lab Results  Component Value Date   CREATININE 0.81 06/09/2023   BUN 10 06/09/2023   NA 134 (L) 06/09/2023   K 3.3 (L) 06/09/2023   CL 104 06/09/2023   CO2 23 06/09/2023    Lab Results  Component Value Date   ALT 14 06/08/2023   AST 27 06/08/2023   ALKPHOS 60 06/08/2023   BILITOT 0.6 06/08/2023     Microbiology: Recent Results (from the past 240 hour(s))  MRSA Next Gen by PCR, Nasal     Status: Abnormal   Collection Time: 06/08/23  6:58 PM   Specimen: Nasal Mucosa; Nasal Swab  Result Value Ref Range Status   MRSA by PCR Next Gen DETECTED (A) NOT DETECTED Final    Comment: CRITICAL RESULT CALLED TO, READ BACK BY AND VERIFIED WITH: [name] @ 2039 06/08/23 [name] (NOTE) The GeneXpert MRSA Assay (FDA approved for NASAL specimens only), is one component of a comprehensive MRSA colonization surveillance program. It is not intended to diagnose MRSA infection nor to guide or monitor treatment for MRSA infections. Test performance is not FDA approved in patients less than 59 years old. Performed at [facility], [address] [place], [place] [address]     Acey Lav, MD [facility] [phone] pager  06/09/2023, 5:37 PM

## 2023-06-09 NOTE — TOC Initial Note (Addendum)
Transition of Care [facility]) - Initial/Assessment Note    Patient Details  Name: Tristan [name] MRN: [id number] Date of Birth: October 21, 1985  [facility]) CM/SW Contact:    Darleene Cleaver, LCSW Phone Number: 06/09/2023, 4:03 PM  Clinical Narrative:                 Patient is a 37 year old male who is alert and oriented x2.  Patient has some confusion assessment completed by reviewing patient's chart.  Patient has history of drug use and alcohol use.  Patient was given Narcan upon admission to the hospital.  Patient does not have any insurance and is homeless.  Patient was found wandering in the woods.  Patient was inquiring about suboxone clinics per PA.  CSW informed the PA that it would have to be a referral to the pain management clinic in order for him to get the suboxone that he needs.  Substance abuse resources have been added to his AVS along with homelessness resources as well.  Patient did not state where he will be going, yet.  Patient does not have a PCP, CSW attempted to contact the [facility] at [phone] to make an appointment for patient, but called after they had closed.  TOC to try tomorrow, TOC to continue to follow patient's progress throughout discharge planning.    Expected Discharge Plan: Home/Self Care Barriers to Discharge: Continued Medical Work up   Patient Goals and CMS Choice Patient states their goals for this hospitalization and ongoing recovery are:: To return back home. CMS Medicare.gov Compare Post Acute Care list provided to:: Patient Choice offered to / list presented to : Patient      Expected Discharge Plan and Services       Living arrangements for the past 2 months: Homeless                                      Prior Living Arrangements/Services Living arrangements for the past 2 months: Homeless Lives with:: Self Patient language and need for interpreter reviewed:: Yes Do you feel safe  going back to the place where you live?: Yes      Need for Family Participation in Patient Care: No (Comment) Care giver support system in place?: No (comment)   Criminal Activity/Legal Involvement Pertinent to Current Situation/Hospitalization: No - Comment as needed  Activities of Daily Living      Permission Sought/Granted Permission sought to share information with : Case Manager, Magazine features editor Permission granted to share information with : Yes, Verbal Permission Granted, Yes, Release of Information Signed  Share Information with NAME: Graiden, Claussen Father   [phone],           Emotional Assessment Appearance:: Appears stated age   Affect (typically observed): Accepting, Anxious Orientation: : Oriented to Self, Oriented to Place, Oriented to  Time Alcohol / Substance Use: Alcohol Use, Illicit Drugs, Tobacco Use Psych Involvement: Outpatient Provider  Admission diagnosis:  Toxic encephalopathy [G92.9] Polysubstance abuse (HCC) [F19.10] Overdose [T50.901A] Patient Active Problem List   Diagnosis Date Noted   Overdose 06/09/2023   Toxic encephalopathy 06/08/2023   Surgical wound dehiscence, initial encounter 05/22/2022   Cellulitis 05/10/2022   Cellulitis of left forearm 05/10/2022   Acute encephalopathy 02/27/2022   Microcytic anemia 02/27/2022   Hypertension    Acute hepatitis C virus infection without hepatic coma 01/18/2022  Psychosis (HCC) 01/16/2022   Transaminitis 01/16/2022   Heroin use disorder, severe, dependence (HCC) 01/07/2022   Altered mental status 10/27/2021   Substance use disorder    Lactic acidosis    Acute respiratory failure with hypoxia and hypercapnia (HCC)    AKI (acute kidney injury) (HCC)    Polysubstance abuse (HCC) 01/29/2019   MDD (major depressive disorder), severe (HCC) 01/27/2019   Suicide attempt (HCC)    Bipolar 1 disorder, mixed (HCC) 05/02/2014   PCP:  Pcp, No Pharmacy:   [facility]  #[id number] [place], [place] - [phone] AT [facility] OF [facility] & [address] [place] [address] Phone: [phone] Fax: [phone]     Social Determinants of Health (SDOH) Social History: SDOH Screenings   Alcohol Screen: High Risk (02/11/2022)  Depression (PHQ2-9): High Risk (02/11/2022)  Tobacco Use: High Risk (06/06/2023)   SDOH Interventions:     Readmission Risk Interventions     No data to display

## 2023-06-10 ENCOUNTER — Other Ambulatory Visit (HOSPITAL_COMMUNITY): Payer: Self-pay

## 2023-06-10 ENCOUNTER — Telehealth (HOSPITAL_COMMUNITY): Payer: Self-pay | Admitting: Pharmacy Technician

## 2023-06-10 DIAGNOSIS — F1910 Other psychoactive substance abuse, uncomplicated: Secondary | ICD-10-CM

## 2023-06-10 DIAGNOSIS — Z21 Asymptomatic human immunodeficiency virus [HIV] infection status: Secondary | ICD-10-CM

## 2023-06-10 LAB — CD4/CD8 (T-HELPER/T-SUPPRESSOR CELL)
CD4 absolute: 365 /uL — ABNORMAL LOW (ref 400–1790)
CD4%: 25.56 % — ABNORMAL LOW (ref 33–65)
CD8 T Cell Abs: 704 /uL (ref 190–1000)
CD8tox: 49.26 % — ABNORMAL HIGH (ref 12–40)
Ratio: 0.52 — ABNORMAL LOW (ref 1.0–3.0)
Total lymphocyte count: 1429 /uL (ref 1000–4000)

## 2023-06-10 LAB — HIV-1/2 AB - DIFFERENTIATION
HIV 1 Ab: REACTIVE
HIV 2 Ab: NONREACTIVE

## 2023-06-10 LAB — GC/CHLAMYDIA PROBE AMP (~~LOC~~) NOT AT ARMC
Chlamydia: NEGATIVE
Comment: NEGATIVE
Comment: NORMAL
Neisseria Gonorrhea: NEGATIVE

## 2023-06-10 LAB — GLUCOSE, CAPILLARY
Glucose-Capillary: 101 mg/dL — ABNORMAL HIGH (ref 70–99)
Glucose-Capillary: 104 mg/dL — ABNORMAL HIGH (ref 70–99)
Glucose-Capillary: 132 mg/dL — ABNORMAL HIGH (ref 70–99)
Glucose-Capillary: 80 mg/dL (ref 70–99)
Glucose-Capillary: 98 mg/dL (ref 70–99)

## 2023-06-10 LAB — HEPATITIS B SURFACE ANTIGEN: Hepatitis B Surface Ag: NONREACTIVE

## 2023-06-10 LAB — RPR: RPR Ser Ql: NONREACTIVE

## 2023-06-10 LAB — HEPATITIS A ANTIBODY, TOTAL: hep A Total Ab: NONREACTIVE

## 2023-06-10 LAB — HEPATITIS B SURFACE ANTIBODY, QUANTITATIVE: Hep B S AB Quant (Post): <3.5 m[IU]/mL — ABNORMAL LOW

## 2023-06-10 LAB — HEMOGLOBIN A1C
Hgb A1c MFr Bld: 5.7 % — ABNORMAL HIGH (ref 4.8–5.6)
Mean Plasma Glucose: 117 mg/dL

## 2023-06-10 MED ORDER — BICTEGRAVIR-EMTRICITAB-TENOFOV 50-200-25 MG PO TABS
1.0000 | ORAL_TABLET | Freq: Every day | ORAL | Status: DC
  Administered 2023-06-10 – 2023-06-11 (×2): 1 via ORAL
  Filled 2023-06-10 (×2): qty 1

## 2023-06-10 MED ORDER — DIPHENHYDRAMINE HCL 25 MG PO CAPS
50.0000 mg | ORAL_CAPSULE | Freq: Three times a day (TID) | ORAL | Status: DC | PRN
  Administered 2023-06-10 – 2023-06-11 (×2): 50 mg via ORAL
  Filled 2023-06-10 (×2): qty 2

## 2023-06-10 MED ORDER — BIKTARVY 50-200-25 MG PO TABS
1.0000 | ORAL_TABLET | Freq: Every day | ORAL | 1 refills | Status: DC
  Filled 2023-06-10: qty 30, 30d supply, fill #0

## 2023-06-10 NOTE — TOC Progression Note (Signed)
Transition of Care [name]) - Progression Note    Patient Details  Name: Tristan [name] MRN: [id number] Date of Birth: May 01, 1986  Transition of Care [facility]) CM/SW Contact  Otelia Santee, LCSW Phone Number: 06/10/2023, 12:01 PM  Clinical Narrative:    PCP appointment scheduled for pt at the [facility] on 06/17/23 at 2:20pm. Information placed on pt's follow up.    Expected Discharge Plan: Home/Self Care Barriers to Discharge: Continued Medical Work up  Expected Discharge Plan and Services       Living arrangements for the past 2 months: Homeless                                       Social Determinants of Health (SDOH) Interventions SDOH Screenings   Food Insecurity: No Food Insecurity (06/09/2023)  Housing: Low Risk  (06/09/2023)  Transportation Needs: Unknown (06/09/2023)  Utilities: Not At Risk (06/09/2023)  Alcohol Screen: High Risk (02/11/2022)  Depression (PHQ2-9): High Risk (02/11/2022)  Tobacco Use: High Risk (06/09/2023)    Readmission Risk Interventions     No data to display

## 2023-06-10 NOTE — Progress Notes (Signed)
Referral placed to [name] with [facility] for bridge counseling in the event confirmatory testing returns positive.   Sandie Ano, RN

## 2023-06-10 NOTE — Progress Notes (Signed)
NAME:  Tristan [name], MRN:  [id number], DOB:  1986/04/07, LOS: 1 ADMISSION DATE:  06/08/2023, CONSULTATION DATE:  06/08/2023  REFERRING MD:  ED, CHIEF COMPLAINT:  encephalopathy   History of Present Illness:  37 year old male with past medical history of polysubstance abuse, hypertension, hepatitis C, bipolar 1 who presented to the ED on 06/08/2023 for altered mental status. He was found in the woods altered, admitted to possible heroin use. He was initially given Narcan by EMS with minimal response. Work up in the ED with negative CT head imaging. CXR with atelectasis. WBC 10.3, CK 427, UDS +opiates/cocaine/amphetamines/THC. Patient ended up requiring subsequent dose of Narcan with some response. He did require Ativan and Cogentin for agitation. Eventually placed on Narcan drip for ongoing lethargy and PCCM consulted.   Pertinent  Medical History  Polysubstance abuse, hypertension, hepatitis C, bipolar 1 disorder   Significant Hospital Events: Including procedures, antibiotic start and stop dates in addition to other pertinent events   9/15: admit to PCCM from ED on narcan drip 9/16: narcan gtt off at ~0300. Possible to TRH vs discharge today if remains stable off gtt. Prelim HIV+. Consulted and seen by ID [facility] Added WB, CD4, hepatitis serology 9/17: pending HIV confirmation studies, RPR pending. TOC/SW consulted to help with insurance needs/follow-up given homelessness status, no phone.   Interim History / Subjective:  No complaints this morning. Resting in bed in no acute distress.   Objective   Blood pressure (!) 142/74, pulse (!) 50, temperature 98 F (36.7 C), temperature source Oral, resp. rate 18, SpO2 100%.        Intake/Output Summary (Last 24 hours) at 06/10/2023 0958 Last data filed at 06/09/2023 1100 Gross per 24 hour  Intake 240 ml  Output 450 ml  Net -210 ml   There were no vitals filed for this visit.  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  Pending HIV 1/2 differentiation, CD4/CD8, genotype  - ID consulted, appreciate recommendations - If confirmatory tests positive, patient is agreeable to starting antiretroviral therapy  - Per ID would still benefit from HIV prep therapy  - counseling on cessation of IVDU, needle sharing  - TOC consulted  Hypercarbic respiratory failure: resolved. likely related to respiratory depression in the setting of opiate use. Subsequently got Ativan and Cogentin in ED. CO2 normalized.  - stable off narcan gtt >24 hours   Anemia; likely chronic. Low MCV. - anemia panel negative for iron deficiency  - consider OP hematology referral at discharge   Polysubstance abuse: UDS + opiates, amphetamines, cocaine, THC. No evidence of withdrawal on exam. He continues to ask about Suboxone script but is still evasive about drug use.  - possible pain management referral on discharge - provide counseling on cessation of drug use when clinically appropriate  - CIWA ordered, not having to be utilized at this time.   Best Practice (right click and "Reselect all SmartList Selections" daily)   Diet/type: Regular consistency (see orders) DVT prophylaxis: LMWH GI prophylaxis: N/A Lines: N/A Foley:  N/A Code Status:  full code Last date of multidisciplinary goals of care discussion [discussed plan of  care with him 9/16 AM]  Labs   CBC: Recent Labs  Lab 06/06/23 2037 06/08/23 0429  WBC 8.7 9.5  NEUTROABS 4.8 7.1  HGB 11.5* 10.3*  HCT 34.2* 30.7*  MCV 78.3* 77.5*  PLT 361 359     Basic Metabolic Panel: Recent Labs  Lab 06/06/23 2037 06/08/23 0429 06/09/23 0830  NA 139 138 134*  K 4.2 3.9 3.3*  CL 105 103 104  CO2 27 27 23   GLUCOSE 96 89 113*  BUN 16 17 10   CREATININE 0.95 1.12 0.81  CALCIUM 9.3 9.3 8.6*  MG  --   --  2.1   GFR: Estimated Creatinine Clearance: 128.9 mL/min (by C-G formula based on SCr of 0.81 mg/dL). Recent Labs  Lab 06/06/23 2037 06/08/23 0429 06/08/23 0430 06/08/23 0630  WBC 8.7 9.5  --   --   LATICACIDVEN  --   --  1.8 1.6    Liver Function Tests: Recent Labs  Lab 06/06/23 2037 06/08/23 0429  AST 25 27  ALT 14 14  ALKPHOS 69 60  BILITOT 0.5 0.6  PROT 7.6 6.9  ALBUMIN 4.1 3.7   No results for input(s): "LIPASE", "AMYLASE" in the last 168 hours. No results for input(s): "AMMONIA" in the last 168 hours.  ABG    Component Value Date/Time   PHART 7.39 06/08/2023 0430   PCO2ART 53 (H) 06/08/2023 0430   PO2ART 90 06/08/2023 0430   HCO3 26.6 06/09/2023 0832   TCO2 26 02/18/2022 1613   ACIDBASEDEF 2.0 02/18/2022 1613   O2SAT 88.5 06/09/2023 0832     Coagulation Profile: No results for input(s): "INR", "PROTIME" in the last 168 hours.  Cardiac Enzymes: Recent Labs  Lab 06/08/23 0429  CKTOTAL 427*    HbA1C: Hgb A1c MFr Bld  Date/Time Value Ref Range Status  06/09/2023 03:09 AM 5.7 (H) 4.8 - 5.6 % Final    Comment:    (NOTE)         Prediabetes: 5.7 - 6.4         Diabetes: >6.4         Glycemic control for adults with diabetes: <7.0   11/21/2021 07:54 PM 5.1 4.8 - 5.6 % Final    Comment:    (NOTE) Pre diabetes:          5.7%-6.4%  Diabetes:              >6.4%  Glycemic control for   <7.0% adults with diabetes     CBG: Recent Labs  Lab 06/09/23 1648 06/09/23 1930 06/10/23 0020 06/10/23 0420 06/10/23 0802  GLUCAP 89 90 104* 98 101*    Review of Systems:   No complaints   Past Medical History:  He,  has a past medical history of Bipolar 1 disorder (HCC), Hepatitis C, Hypertension,  and PTSD (post-traumatic stress disorder).   Surgical History:   Past Surgical History:  Procedure Laterality Date   I & D EXTREMITY Left 05/11/2022   Procedure: IRRIGATION AND DEBRIDEMENT FOREARM;  Surgeon: Marlyne Beards, MD;  Location: [facility] OR;  Service: Orthopedics;  Laterality: Left;   NO PAST SURGERIES       Social History:   reports that he has been smoking cigarettes. He has never used smokeless tobacco. He reports current alcohol use. He reports current drug use. Drugs: Marijuana, Heroin, Cocaine, Methamphetamines, and Morphine.   Family History:  His family history includes Other in his father; Psychiatric Illness in his father.   Allergies No Known Allergies  Home Medications  Prior to Admission medications   Medication Sig Start Date End Date Taking? Authorizing Provider  folic acid (FOLVITE) 1 MG tablet Take 1 tablet (1 mg total) by mouth daily. Patient not taking: Reported on 02/27/2022 02/23/22   Joycelyn Das, MD     Critical care time:

## 2023-06-10 NOTE — Telephone Encounter (Signed)
Patient Advocate Encounter  Completed and sent [employer] Advancing Access application for Biktarvy for this patient who is uninsured.    BIN      [id number] PCN    [id number] [id number]    [id number] ID        [id number]    Roland Earl, CPhT Pharmacy Patient Advocate Specialist [facility] Patient Advocate Team Direct Number: [phone] Fax: [phone]

## 2023-06-10 NOTE — Progress Notes (Signed)
Subjective: No new complaints, he is anxious to go home   Antibiotics:  Anti-infectives (From admission, onward)    None       Medications: Scheduled Meds:  Chlorhexidine Gluconate Cloth  6 each Topical Daily   enoxaparin (LOVENOX) injection  40 mg Subcutaneous Q24H   folic acid  1 mg Oral Daily   insulin aspart  0-9 Units Subcutaneous Q4H   multivitamin with minerals  1 tablet Oral Daily   thiamine  100 mg Oral Daily   Or   thiamine  100 mg Intravenous Daily   Continuous Infusions: PRN Meds:.acetaminophen, docusate sodium, LORazepam **OR** LORazepam, ondansetron (ZOFRAN) IV, mouth rinse, polyethylene glycol    Objective: Weight change:   Intake/Output Summary (Last 24 hours) at 06/10/2023 1433 Last data filed at 06/10/2023 1308 Gross per 24 hour  Intake 480 ml  Output --  Net 480 ml   Blood pressure 126/73, pulse 80, temperature 97.6 F (36.4 C), temperature source Oral, resp. rate 18, SpO2 100%. Temp:  [97.6 F (36.4 C)-98.3 F (36.8 C)] 97.6 F (36.4 C) (09/17 1318) Pulse Rate:  [50-80] 80 (09/17 1318) Resp:  [14-31] 18 (09/17 1318) BP: (117-147)/(54-80) 126/73 (09/17 1318) SpO2:  [94 %-100 %] 100 % (09/17 1318)  Physical Exam: Physical Exam Constitutional:      Appearance: He is well-developed.  HENT:     Head: Normocephalic and atraumatic.  Eyes:     Conjunctiva/sclera: Conjunctivae normal.  Cardiovascular:     Rate and Rhythm: Normal rate and regular rhythm.  Pulmonary:     Effort: Pulmonary effort is normal. No respiratory distress.     Breath sounds: No wheezing.  Abdominal:     General: There is no distension.     Palpations: Abdomen is soft.  Musculoskeletal:        General: Normal range of motion.     Cervical back: Normal range of motion and neck supple.  Skin:    General: Skin is warm and dry.     Findings: No erythema or rash.  Neurological:     General: No focal deficit present.     Mental Status: He is alert and  oriented to person, place, and time.  Psychiatric:        Mood and Affect: Mood normal.        Behavior: Behavior normal.        Thought Content: Thought content normal.        Judgment: Judgment normal.      CBC:    BMET Recent Labs    06/08/23 0429 06/09/23 0830  NA 138 134*  K 3.9 3.3*  CL 103 104  CO2 27 23  GLUCOSE 89 113*  BUN 17 10  CREATININE 1.12 0.81  CALCIUM 9.3 8.6*     Liver Panel  Recent Labs    06/08/23 0429  PROT 6.9  ALBUMIN 3.7  AST 27  ALT 14  ALKPHOS 60  BILITOT 0.6       Sedimentation Rate No results for input(s): "ESRSEDRATE" in the last 72 hours. C-Reactive Protein No results for input(s): "CRP" in the last 72 hours.  Micro Results: Recent Results (from the past 720 hour(s))  MRSA Next Gen by PCR, Nasal     Status: Abnormal   Collection Time: 06/08/23  6:58 PM   Specimen: Nasal Mucosa; Nasal Swab  Result Value Ref Range Status   MRSA by PCR Next Gen DETECTED (A) NOT DETECTED Final  Comment: CRITICAL RESULT CALLED TO, READ BACK BY AND VERIFIED WITH: [name] @ 2039 06/08/23 [name] (NOTE) The GeneXpert MRSA Assay (FDA approved for NASAL specimens only), is one component of a comprehensive MRSA colonization surveillance program. It is not intended to diagnose MRSA infection nor to guide or monitor treatment for MRSA infections. Test performance is not FDA approved in patients less than 60 years old. Performed at [facility], [address], [place], [place] [address]     Studies/Results: No results found.    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 male with newly diagnosed HIV infection, and hx of treated chronic hepatitis C without hepatic coma, bipolar disorder IV drug use homelessness he was admitted with opiate overdose.  #1 HIV disease: Will start Biktarvy and have procured a 30-day supply of this for him.  We are endeavoring to get him plugged into Medicaid through our clinic and if not Medicaid H MAP though he should qualify for the former  I called [name] from [employer] while is in the room with [name] and had the 2 of them talk together.  I also made an appointment for the patient to see me on Monday, September 30 which [name] also knows about.  Apparently the patient's cell phone which is listed is actually his father's phone but he says that his father is a good person to get in touch with to arrange and inform him of his appointments.  His dad also potentially might be able to help bring him to his appointments.  (His Dad is also aware of his HIV + diagnosis)  #2 HCV: We will endeavor to treat as an outpatient  #3 IVDU needs opiate replacement  #4  Homelessness: Will work with [facility] on housing for him  I have personally spent 60 minutes involved in face-to-face and non-face-to-face activities for this patient on the day of the visit. Professional time spent includes the following activities: Preparing to see the patient (review of tests), Obtaining and/or reviewing separately obtained history (admission/discharge record), Performing a medically appropriate examination and/or evaluation , Ordering medications/tests/procedures, referring and communicating with other health care professionals, Documenting clinical information in the EMR, Independently interpreting results (not separately reported), Communicating results to the patient/family/caregiver, Counseling and educating the patient/family/caregiver and Care coordination (not separately reported).     Tristan [name] has an appointment on 06/23/2023 at 55M with Dr. Daiva Eves at  [facility], which  is located in the [facility] at  [address] [place] in [place].  [address], which is located to the left of the elevators.  Phone: [phone]  Fax: [phone]  [email or web address]  The patient should arrive 30 minutes prior to their appoitment.    LOS: 1 day   Acey Lav 06/10/2023, 2:33 PM

## 2023-06-10 NOTE — Plan of Care (Signed)

## 2023-06-10 NOTE — Progress Notes (Signed)
eLink Physician-Brief Progress Note Patient Name: Pavlos Stiltner DOB: [birth year] MRN: [id number]   Date of Service  06/10/2023  HPI/Events of Note  Bedside RN asking for something for itching as patient is c/o itching all over No rash noted  Patient is not in a camera accessible room  eICU Interventions  Benadryl 50 mg PO q8 prn ordered Discussed with BSRN to investigate the trigger     Intervention Category Minor Interventions: Routine modifications to care plan (e.g. PRN medications for pain, fever)  Rosalie Gums Elisandro Jarrett 06/10/2023, 9:13 PM

## 2023-06-11 ENCOUNTER — Other Ambulatory Visit: Payer: Self-pay

## 2023-06-11 ENCOUNTER — Emergency Department (HOSPITAL_COMMUNITY)
Admission: EM | Admit: 2023-06-11 | Discharge: 2023-06-11 | Disposition: A | Discharge status: Home / Self Care | Payer: Self-pay | Attending: Emergency Medicine | Admitting: Emergency Medicine

## 2023-06-11 ENCOUNTER — Emergency Department (HOSPITAL_COMMUNITY): Payer: Self-pay

## 2023-06-11 ENCOUNTER — Encounter (HOSPITAL_COMMUNITY): Payer: Self-pay

## 2023-06-11 ENCOUNTER — Other Ambulatory Visit (HOSPITAL_COMMUNITY): Payer: Self-pay

## 2023-06-11 DIAGNOSIS — Z21 Asymptomatic human immunodeficiency virus [HIV] infection status: Secondary | ICD-10-CM | POA: Insufficient documentation

## 2023-06-11 DIAGNOSIS — B20 Human immunodeficiency virus [HIV] disease: Secondary | ICD-10-CM

## 2023-06-11 DIAGNOSIS — T40601A Poisoning by unspecified narcotics, accidental (unintentional), initial encounter: Secondary | ICD-10-CM | POA: Insufficient documentation

## 2023-06-11 LAB — COMPREHENSIVE METABOLIC PANEL WITH GFR
ALT: 14 U/L (ref 0–44)
AST: 17 U/L (ref 15–41)
Albumin: 3.9 g/dL (ref 3.5–5.0)
Alkaline Phosphatase: 64 U/L (ref 38–126)
Anion gap: 8 (ref 5–15)
BUN: 15 mg/dL (ref 6–20)
CO2: 22 mmol/L (ref 22–32)
Calcium: 9.1 mg/dL (ref 8.9–10.3)
Chloride: 108 mmol/L (ref 98–111)
Creatinine, Ser: 1.58 mg/dL — ABNORMAL HIGH (ref 0.61–1.24)
GFR, Estimated: 57 mL/min — ABNORMAL LOW (ref >60)
Glucose, Bld: 105 mg/dL — ABNORMAL HIGH (ref 70–99)
Potassium: 4.3 mmol/L (ref 3.5–5.1)
Sodium: 138 mmol/L (ref 135–145)
Total Bilirubin: 0.3 mg/dL (ref 0.3–1.2)
Total Protein: 7.4 g/dL (ref 6.5–8.1)

## 2023-06-11 LAB — GLUCOSE, CAPILLARY
Glucose-Capillary: 136 mg/dL — ABNORMAL HIGH (ref 70–99)
Glucose-Capillary: 141 mg/dL — ABNORMAL HIGH (ref 70–99)
Glucose-Capillary: 87 mg/dL (ref 70–99)

## 2023-06-11 LAB — RAPID URINE DRUG SCREEN, HOSP PERFORMED
Amphetamines: POSITIVE — AB
Barbiturates: NOT DETECTED
Benzodiazepines: NOT DETECTED
Cocaine: POSITIVE — AB
Opiates: NOT DETECTED
Tetrahydrocannabinol: POSITIVE — AB

## 2023-06-11 LAB — CBC
HCT: 36.3 % — ABNORMAL LOW (ref 39.0–52.0)
Hemoglobin: 12.3 g/dL — ABNORMAL LOW (ref 13.0–17.0)
MCH: 26.2 pg (ref 26.0–34.0)
MCHC: 33.9 g/dL (ref 30.0–36.0)
MCV: 77.4 fL — ABNORMAL LOW (ref 80.0–100.0)
Platelets: 382 10*3/uL (ref 150–400)
RBC: 4.69 MIL/uL (ref 4.22–5.81)
RDW: 15.6 % — ABNORMAL HIGH (ref 11.5–15.5)
WBC: 8.8 10*3/uL (ref 4.0–10.5)
nRBC: 0 % (ref 0.0–0.2)

## 2023-06-11 LAB — SALICYLATE LEVEL: Salicylate Lvl: <7 mg/dL — ABNORMAL LOW (ref 7.0–30.0)

## 2023-06-11 LAB — ACETAMINOPHEN LEVEL: Acetaminophen (Tylenol), Serum: <10 ug/mL — ABNORMAL LOW (ref 10–30)

## 2023-06-11 LAB — ETHANOL: Alcohol, Ethyl (B): <10 mg/dL (ref <10)

## 2023-06-11 MED ORDER — BICTEGRAVIR-EMTRICITAB-TENOFOV 50-200-25 MG PO TABS: 1.0000 | ORAL_TABLET | Freq: Every day | ORAL | 1 refills | Status: DC

## 2023-06-11 MED ORDER — MUPIROCIN 2 % EX OINT: 1.0000 | TOPICAL_OINTMENT | Freq: Two times a day (BID) | CUTANEOUS | Status: DC

## 2023-06-11 MED ORDER — HYDROCORTISONE 1 % EX CREA
TOPICAL_CREAM | Freq: Two times a day (BID) | CUTANEOUS | 0 refills | Status: DC | PRN
  Filled 2023-06-11: qty 28, 7d supply, fill #0

## 2023-06-11 MED ORDER — LACTATED RINGERS IV BOLUS
1000.0000 mL | Freq: Once | INTRAVENOUS | Status: AC
  Administered 2023-06-11: 1000 mL via INTRAVENOUS

## 2023-06-11 MED ORDER — HYDROCORTISONE 1 % EX CREA: TOPICAL_CREAM | Freq: Two times a day (BID) | CUTANEOUS | Status: DC | PRN

## 2023-06-11 MED ORDER — NALOXONE HCL 0.4 MG/ML IJ SOLN: INTRAMUSCULAR | 0 refills | Status: DC

## 2023-06-11 MED ORDER — BICTEGRAVIR-EMTRICITAB-TENOFOV 50-200-25 MG PO TABS
1.0000 | ORAL_TABLET | Freq: Every day | ORAL | 0 refills | Status: DC
  Filled 2023-06-11: qty 30, 30d supply, fill #0

## 2023-06-11 NOTE — Discharge Instructions (Addendum)
You were seen today for an overdose.  I recommend you stop using any sort of opioids due to risk for overdose and potential death.  You should follow-up with your primary care provider.  If you develop any new concerning symptoms you should return to the ED.  He can use the Narcan as needed for overdose but should return to the ED if you require this medication.

## 2023-06-11 NOTE — ED Notes (Signed)
Pt refused vitals 

## 2023-06-11 NOTE — Plan of Care (Signed)

## 2023-06-11 NOTE — Discharge Summary (Signed)
Physician Discharge Summary       Patient ID: Tristan [name] MRN: [id number] DOB/AGE: August 01, 1986 37 y.o.  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 He was found in the woods altered, admitted to possible heroin use. He was initially given Narcan by EMS with minimal response. Work up in the ED with negative CT head imaging. CXR with atelectasis. WBC 10.3, CK 427, UDS +opiates/cocaine/amphetamines/THC. Patient ended up requiring subsequent dose of Narcan with some response. He did require Ativan and Cogentin for agitation. Eventually placed on Narcan drip for ongoing lethargy and PCCM consulted.   Hospital Course:  Patient was admitted to PCCM from ED on 06/08/23 on narcan drip. The narcan drip was stopped on 06/09/23 around 0300. He did not require any subsequent doses of Narcan throughout his stay. On admission, HIV testing was ordered which subsequently returned positive. Because of this, infectious disease, Dr. Daiva Eves was consulted. He ordered further laboratory work up which confirmed his new diagnosis of HIV 1. He was started on Biktarvy here in the hospital. His CD4 count is 365, so does not have AIDS at this time. Genotyping was sent off and pending. On 06/10/23 he began having rash to his extremities. Doubt drug reaction, no new foods. Could be environmental vs. Poison ivy/oak as the patient was found rolling around in the woods. He was given benadryl and hydrocortisone cream for this. No  mucous membrane involvement or skin sloughing. Given the patient's complex social situation (homeless, IVDU), we consulted with social work to provide resources, etc for the patient. He was given housing and needling exchange resources. Pharmacy provided him with his first 30 days of Biktarvy prescription. SW/TOC is working on setting the patient up on Medicaid. Additionally, he has been set up with a PCP and ID follow-up appointment for next week.   Discharge Plan by Active Problems:  Human Immunodeficiency Virus (HIV) - started on Biktarvy this admission. Provided with first 30 day Rx of Biktarvy until follow-up - CD4 without evidence of AIDS at this time  - seen by ID and has follow-up appointment with Dr. Daiva Eves  - team did discuss likely related to IVDU. We discussed cessation of drug use. Additionally, social work provided him with needle exchange resources.   Hepatitis C  - seen by Dr. Daiva Eves, who will treat outpatient   IVDU  - endorses using fentanyl and heroin  - Amb referral to pain management clinic given for possibly starting Suboxone   Homelessness  - given housing resources by social work  Significant Hospital Tests/Studies: UDS+ cocaine, methamphetamines, THC, opiates  HIV Ab +, HIV 1+ CD4/CD8 365, 704 respectively RPR negative  HCV Ab +  Consults: Infectious Disease, Dr. Daiva Eves  Social Work, Transitions of Care, Case Management   Discharge Exam: BP 118/70 (BP Location: Right Arm)   Pulse 71   Temp 98.1 F (36.7 C) (Oral)   Resp 20   Wt 93.7 kg   SpO2 96%   BMI 29.64 kg/m   General:  Chronically ill-appearing, stable in NAD. HEENT: [place]/AT, anicteric sclera, PERRL, moist mucous membranes. Neuro: Awake, oriented x 4. Responds to verbal stimuli. Following commands consistently. Moves all 4 extremities spontaneously. Strength 5/5 in all extremities.   CV: RRR, no m/g/r. PULM: Breathing even and unlabored on room air. Lung fields clear. GI: Soft, nontender,  nondistended. Normoactive bowel sounds. Extremities: no LE edema noted. Skin: Warm/dry, rash to extremities, see media for photos.  Labs at Discharge: Lab Results  Component Value Date   CREATININE 0.81 06/09/2023   BUN 10 06/09/2023   NA 134 (L) 06/09/2023   K 3.3 (L) 06/09/2023   CL 104 06/09/2023   CO2 23 06/09/2023   Lab Results  Component Value Date   WBC 9.5 06/08/2023   HGB 10.3 (L) 06/08/2023   HCT 30.7 (L) 06/08/2023   MCV 77.5 (L) 06/08/2023   PLT 359 06/08/2023   Lab Results  Component Value Date   ALT 14 06/08/2023   AST 27 06/08/2023   ALKPHOS 60 06/08/2023   BILITOT 0.6 06/08/2023   Lab Results  Component Value Date   INR 1.1 05/11/2022   INR 1.0 01/21/2022   INR 1.0 01/18/2022    Current Radiology Studies: No results found.  Disposition:  Discharge disposition: 01-Home or Self Care      Specialty: Infectious Disease You have a follow-up appointment with infectious disease, Dr. Daiva Eves on Monday, June 23, 2023 Discharge Instructions     Ambulatory referral to Pain Clinic   Complete by: As directed    Call MD for:  difficulty breathing, headache or visual disturbances   Complete by: As directed    Call MD for:  extreme fatigue   Complete by: As directed    Call MD for:  hives   Complete by: As directed    Call MD for:  persistant dizziness or light-headedness   Complete by: As directed    Call MD for:  persistant nausea and vomiting   Complete by: As directed    Call MD for:  severe uncontrolled pain   Complete by: As directed    Call MD for:  temperature >100.4   Complete by: As directed    Diet - low sodium heart healthy   Complete by: As directed    Increase activity slowly   Complete by: As directed       Allergies as of 06/11/2023   No Known Allergies      Medication List     TAKE these medications    Biktarvy 50-200-25 MG Tabs tablet Generic drug: bictegravir-emtricitabine-tenofovir AF Take 1 tablet by mouth  daily.   bictegravir-emtricitabine-tenofovir AF 50-200-25 MG Tabs tablet Commonly known as: BIKTARVY Take 1 tablet by mouth daily. Start taking on: June 12, 2023   folic acid 1 MG tablet Commonly known as: FOLVITE Take 1 tablet (1 mg total) by mouth daily.   hydrocortisone cream 1 % Apply topically 2 (two) times daily as needed for itching.        Follow-up Information     [facility]. Go on 06/17/2023.   Specialty: Internal Medicine Why: You have an appointment to establish primary care services on Tuesday, 06/17/23 at 2:20pm. Please arrive 15 minutes early to complete new patient paperwork. Contact information: [address] [place] [address] [phone]                Discharged Condition: good  35 minutes of time have been dedicated to  discharge assessment, planning and discharge instructions.   Signed:  Cristopher Peru, PA-C [facility] 06/11/23 10:14 AM  Please see [email or web address] for pager details.  From 7A-7P if no response, please call [phone] After hours, please call ELink [phone]

## 2023-06-11 NOTE — TOC Transition Note (Signed)
Transition of Care [facility]) - CM/SW Discharge Note   Patient Details  Name: Tristan [name] MRN: [id number] Date of Birth: Apr 23, 1986  [facility] CM/SW Contact:  Otelia Santee, LCSW Phone Number: 06/11/2023, 9:52 AM   Clinical Narrative:    Met with pt to review resources and to provide information for syringe exchange programs. Pt verbalized understanding of resources. Pt reports not having mode of transportation at discharge. Bus pass placed on pt's chart to be given by RN at discharge. RN notified.    Final next level of care: Home/Self Care Barriers to Discharge: Barriers Resolved   Patient Goals and CMS Choice CMS Medicare.gov Compare Post Acute Care list provided to:: Patient Choice offered to / list presented to : Patient  Discharge Placement                    Name of family member notified: Patient Patient and family notified of of transfer: 06/11/23  Discharge Plan and Services Additional resources added to the After Visit Summary for                  DME Arranged: N/A DME Agency: NA                  Social Determinants of Health (SDOH) Interventions SDOH Screenings   Food Insecurity: No Food Insecurity (06/09/2023)  Housing: Low Risk  (06/09/2023)  Transportation Needs: Unknown (06/09/2023)  Utilities: Not At Risk (06/09/2023)  Alcohol Screen: High Risk (02/11/2022)  Depression (PHQ2-9): High Risk (02/11/2022)  Tobacco Use: High Risk (06/09/2023)     Readmission Risk Interventions    06/11/2023    9:50 AM  Readmission Risk Prevention Plan  Transportation Screening Complete  PCP or Specialist Appt within 5-7 Days Complete  Home Care Screening Complete  Medication Review (RN CM) Complete

## 2023-06-11 NOTE — ED Provider Notes (Signed)
[place] EMERGENCY DEPARTMENT AT [facility] Provider Note   CSN: [id number] Arrival date & time: 06/11/23  1425     History {Add pertinent medical, surgical, social history, OB history to HPI:1} Chief Complaint  Patient presents with   Drug Overdose    Tristan [name] is a 37 y.o. male.   Drug Overdose  37 year old male history of bipolar 1, depression, polysubstance use, hepatitis C presenting for overdose.  Patient reportedly was [place] unresponsive in a park.  Was given Narcan with improvement.  On my evaluation he states he feels well.  He was initially sleeping a chair in the room but woke up to verbal stimuli.  He states he feels well.  He reports injecting unknown drug earlier today.  He states he has not tried to hurt himself, no SI or HI or hallucinations.  He stated he was just discharged in the hospital here today after admission for overdose.  Per chart review he was admitted for overdose and altered mental status.  He was diagnosed with HIV during admission.  He denies any complaints.  Is not headache, neck pain, chest pain, back pain abdominal pain.  No difficulty breathing.     Home Medications Prior to Admission medications   Medication Sig Start Date End Date Taking? Authorizing Provider  bictegravir-emtricitabine-tenofovir AF (BIKTARVY) 50-200-25 MG TABS tablet Take 1 tablet by mouth daily. 06/10/23   Randall Hiss, MD  bictegravir-emtricitabine-tenofovir AF (BIKTARVY) 50-200-25 MG TABS tablet Take 1 tablet by mouth daily. 06/12/23   Cristopher Peru, PA-C  folic acid (FOLVITE) 1 MG tablet Take 1 tablet (1 mg total) by mouth daily. Patient not taking: Reported on 02/27/2022 02/23/22   Joycelyn Das, MD  hydrocortisone cream 1 % Apply topically 2 (two) times daily as needed for itching. 06/11/23   Cristopher Peru, PA-C      Allergies    Patient has no known allergies.    Review of Systems   Review of Systems Review of systems completed  and notable as per HPI.  ROS otherwise negative.   Physical Exam Updated Vital Signs BP (!) 97/54   Pulse 72   Temp 97.8 F (36.6 C) (Oral)   Resp 15   SpO2 98%  Physical Exam Vitals and nursing note reviewed.  Constitutional:      General: He is not in acute distress.    Appearance: He is well-developed.  HENT:     Head: Normocephalic and atraumatic.     Nose: Nose normal.     Mouth/Throat:     Mouth: Mucous membranes are moist.     Pharynx: Oropharynx is clear.  Eyes:     Extraocular Movements: Extraocular movements intact.     Conjunctiva/sclera: Conjunctivae normal.     Pupils: Pupils are equal, round, and reactive to light.  Cardiovascular:     Rate and Rhythm: Normal rate and regular rhythm.     Heart sounds: No murmur heard. Pulmonary:     Effort: Pulmonary effort is normal. No respiratory distress.     Breath sounds: Normal breath sounds.  Abdominal:     Palpations: Abdomen is soft.     Tenderness: There is no abdominal tenderness. There is no guarding or rebound.  Musculoskeletal:        General: No swelling.     Cervical back: Neck supple.     Right lower leg: No edema.     Left lower leg: No edema.  Skin:  General: Skin is warm and dry.     Capillary Refill: Capillary refill takes less than 2 seconds.  Neurological:     General: No focal deficit present.     Mental Status: He is alert and oriented to person, place, and time. Mental status is at baseline.     Cranial Nerves: No cranial nerve deficit.     Sensory: No sensory deficit.     Motor: No weakness.  Psychiatric:        Mood and Affect: Mood normal.     ED Results / Procedures / Treatments   Labs (all labs ordered are listed, but only abnormal results are displayed) Labs Reviewed  CBC - Abnormal; Notable for the following components:      Result Value   Hemoglobin 12.3 (*)    HCT 36.3 (*)    MCV 77.4 (*)    RDW 15.6 (*)    All other components within normal limits  COMPREHENSIVE  METABOLIC PANEL  ETHANOL  SALICYLATE LEVEL  ACETAMINOPHEN LEVEL  RAPID URINE DRUG SCREEN, HOSP PERFORMED    EKG None  Radiology No results found.  Procedures Procedures  {Document cardiac monitor, telemetry assessment procedure when appropriate:1}  Medications Ordered in ED Medications  lactated ringers bolus 1,000 mL (has no administration in time range)    ED Course/ Medical Decision Making/ A&P   {   Click here for ABCD2, HEART and other calculatorsREFRESH Note before signing :1}                              Medical Decision Making Amount and/or Complexity of Data Reviewed Labs: ordered. Radiology: ordered.   Medical Decision Making:   Tristan [name] is a 37 y.o. male who presented to the ED today with likely opioid overdose.  Vital signs reviewed.  On exam he initially was sleeping in a chair but woke up easily to verbal stimuli.  He did receive Narcan with EMS.  Here he is awake and alert, unremarkable neurologic exam.  He reports injection of drugs today, likely opioid overdose specially with response to Narcan.  He does not need additional Narcan right now.  Initially wanted to leave hospital, he was amenable to staying after I offered him some food and water.  I discussed with him that he had an opioid overdose and if he were to leave and the Narcan would wear off he could stop breathing and potentially die.  He was able voiced understanding this and was amenable to staying for observation in the ED.   {crccomplexity:27900} Reviewed and confirmed nursing documentation for past medical history, family history, social history.  Reassessment and Plan:   ***    Patient's presentation is most consistent with {EM COPA:27473}     {Document critical care time when appropriate:1} {Document review of labs and clinical decision tools ie heart score, Chads2Vasc2 etc:1}  {Document your independent review of radiology images, and any outside  records:1} {Document your discussion with family members, caretakers, and with consultants:1} {Document social determinants of health affecting pt's care:1} {Document your decision making why or why not admission, treatments were needed:1} Final Clinical Impression(s) / ED Diagnoses Final diagnoses:  None    Rx / DC Orders ED Discharge Orders     None

## 2023-06-11 NOTE — ED Triage Notes (Signed)
Pt found unresponsive in a park. 1.5mg  narcan given IN. Since then pt has been able to maintain airway, placed on 3L O2. Given 500 ml of NS.

## 2023-06-12 ENCOUNTER — Other Ambulatory Visit (HOSPITAL_COMMUNITY): Payer: Self-pay

## 2023-06-12 LAB — HCV RNA DIAGNOSIS, NAA: HCV RNA, Quantitation: NOT DETECTED IU/mL

## 2023-06-17 ENCOUNTER — Inpatient Hospital Stay: Payer: Self-pay | Admitting: Nurse Practitioner

## 2023-06-19 ENCOUNTER — Other Ambulatory Visit (HOSPITAL_COMMUNITY): Payer: Self-pay

## 2023-06-20 ENCOUNTER — Other Ambulatory Visit (HOSPITAL_COMMUNITY): Payer: Self-pay

## 2023-06-20 ENCOUNTER — Telehealth: Payer: Self-pay

## 2023-06-20 LAB — HIV GENOSURE(R) MG

## 2023-06-20 LAB — GENOSURE INTEGRASE HIV EDI: HIV Genosure Integrase PDF 2: 1

## 2023-06-20 LAB — HIV-1 RNA, PCR (GRAPH) RFX/GENO EDI
HIV-1 RNA BY PCR: 1390 {copies}/mL
HIV-1 RNA Quant, Log: 3.143 {Log}

## 2023-06-20 LAB — HIV GENOSURE REFLEX - HIVGTY - ELECTRONIC RECORD

## 2023-06-20 LAB — REFLEX TO GENOSURE(R) MG EDI: HIV GenoSure(R): 1

## 2023-06-20 NOTE — Telephone Encounter (Signed)
[facility] Patient Advocate Encounter ? ?Insurance verification completed.   ? ?The patient is uninsured and will need patient assistance for medication. ? ?We can complete the application and will need to meet with the patient for signatures and income documentation. ? ?Abraham Margulies, CPhT ?Specialty Pharmacy Patient Advocate ?[facility] ?Phone: [phone] ?Fax:  [phone]  ?

## 2023-06-23 ENCOUNTER — Ambulatory Visit: Payer: Self-pay | Admitting: Infectious Disease

## 2023-06-23 ENCOUNTER — Other Ambulatory Visit (HOSPITAL_COMMUNITY): Payer: Self-pay

## 2023-06-23 ENCOUNTER — Ambulatory Visit: Payer: Self-pay

## 2023-06-24 ENCOUNTER — Other Ambulatory Visit (HOSPITAL_COMMUNITY): Payer: Self-pay

## 2023-06-26 ENCOUNTER — Other Ambulatory Visit: Payer: Self-pay

## 2023-07-01 ENCOUNTER — Other Ambulatory Visit (HOSPITAL_COMMUNITY): Payer: Self-pay

## 2023-07-01 ENCOUNTER — Other Ambulatory Visit (HOSPITAL_COMMUNITY): Payer: Self-pay | Admitting: Pharmacy Technician

## 2023-07-01 NOTE — Progress Notes (Signed)
Patient is getting Rx at [facility]. & Disenrolling patient for now. Tristan [name] aware.

## 2023-08-15 IMAGING — US US ABDOMEN LIMITED
1 series · 15 of 25 positions shown · non-contrast
Comparison: None.

CLINICAL DATA: Hepatitis C. Elevated LFTs.

EXAM:
ULTRASOUND ABDOMEN LIMITED RIGHT UPPER QUADRANT

[Series 1: us abdomen limited ruq mc & wl · 15 of 44 slices shown]
[im 1/44]
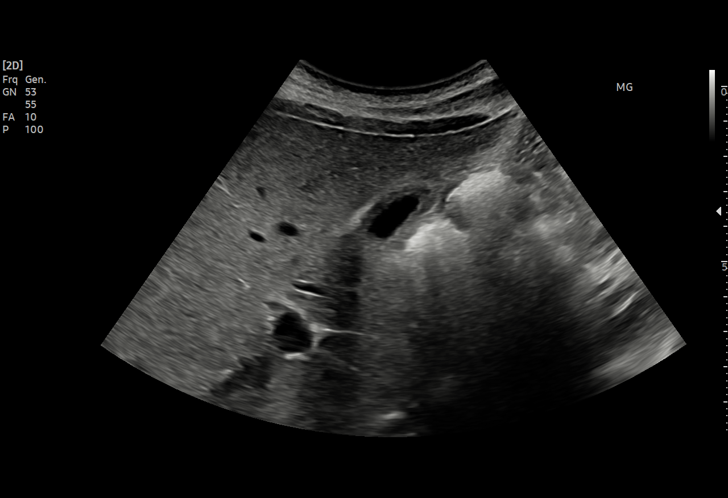
[im 4/44]
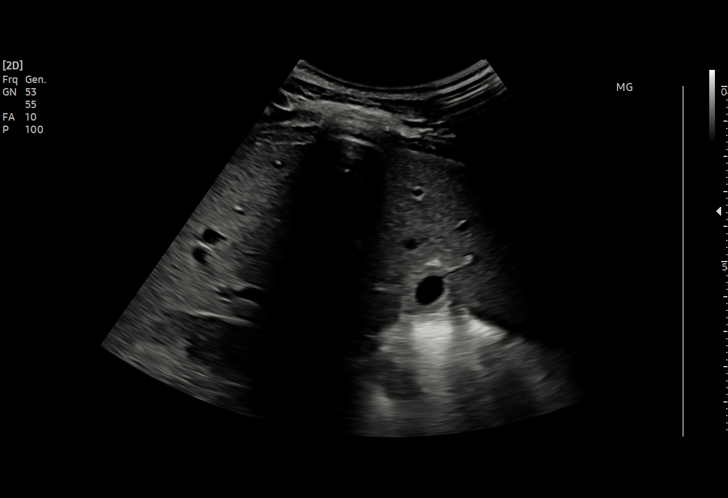
[im 8/44]
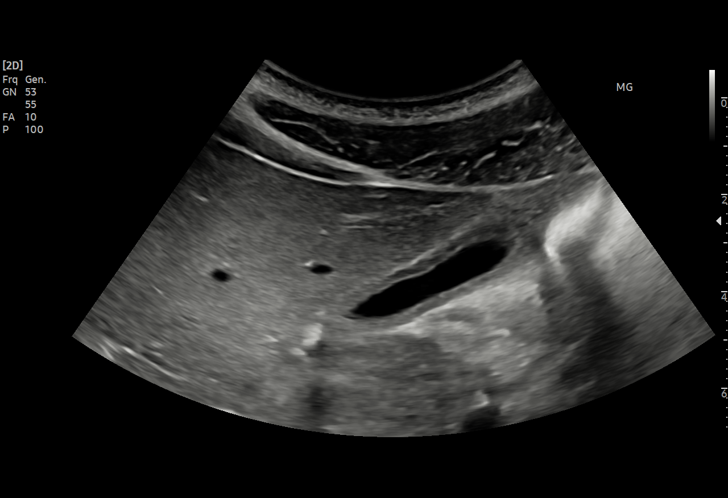
[im 9/44]
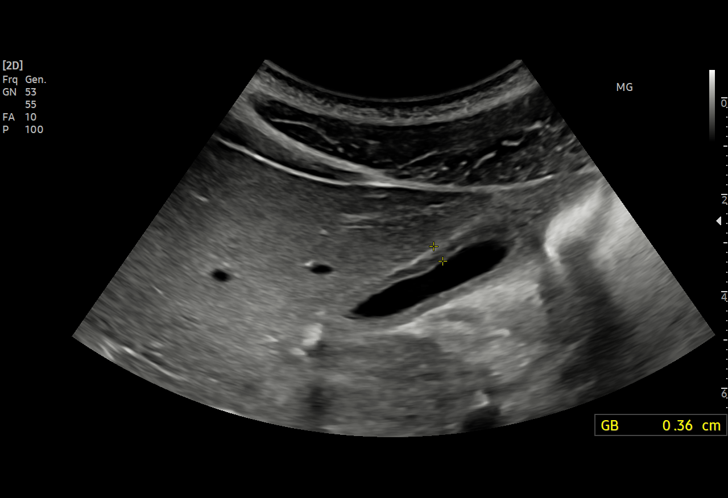
[im 13/44]
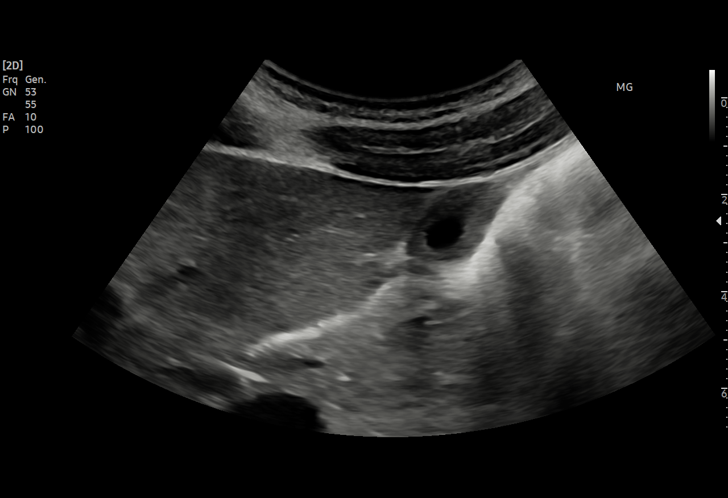
[im 17/44]
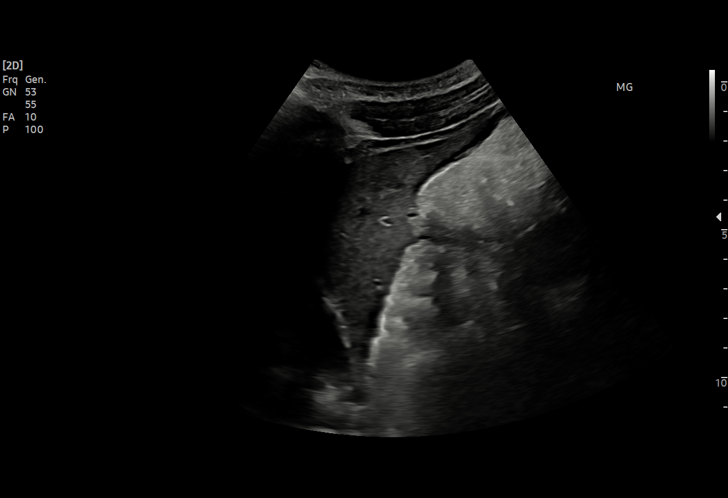
[im 18/44]
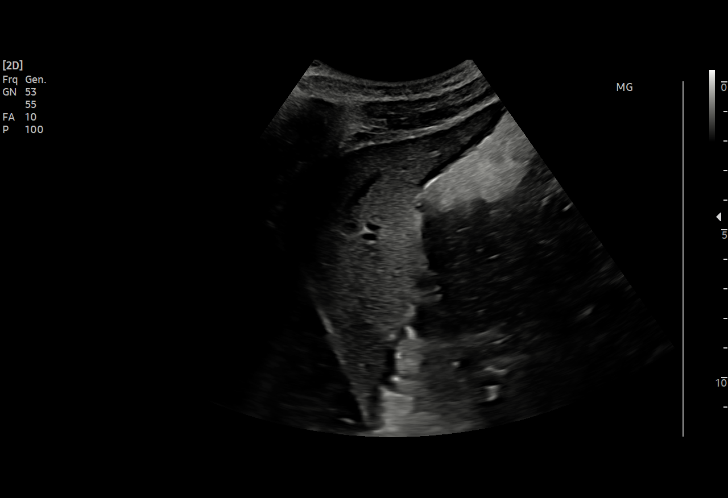
[im 22/44]
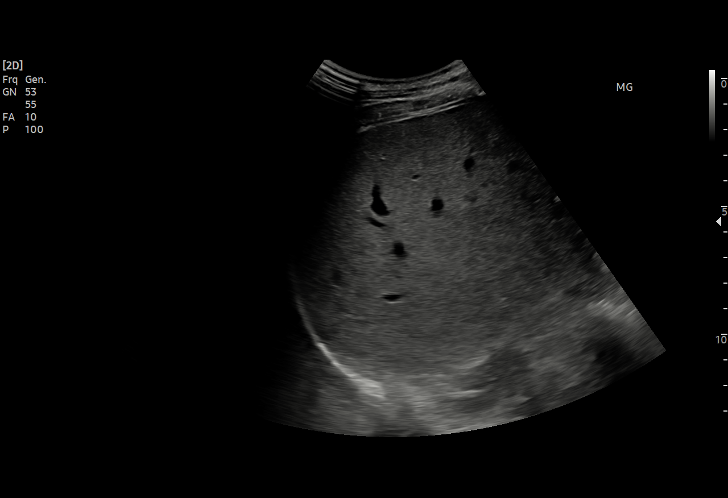
[im 26/44]
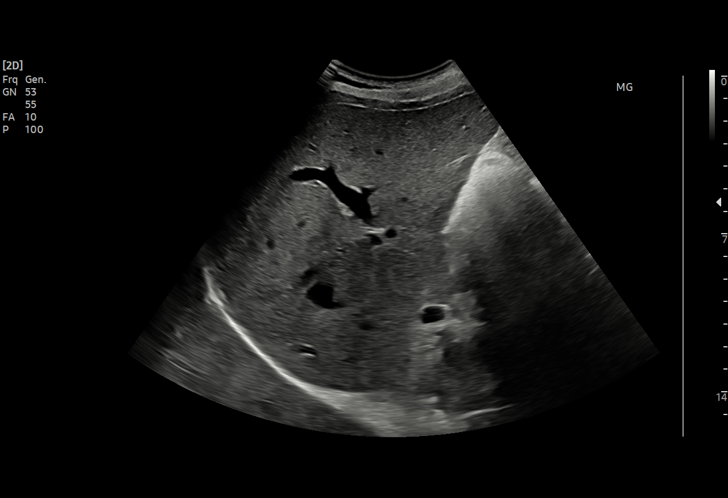
[im 27/44]
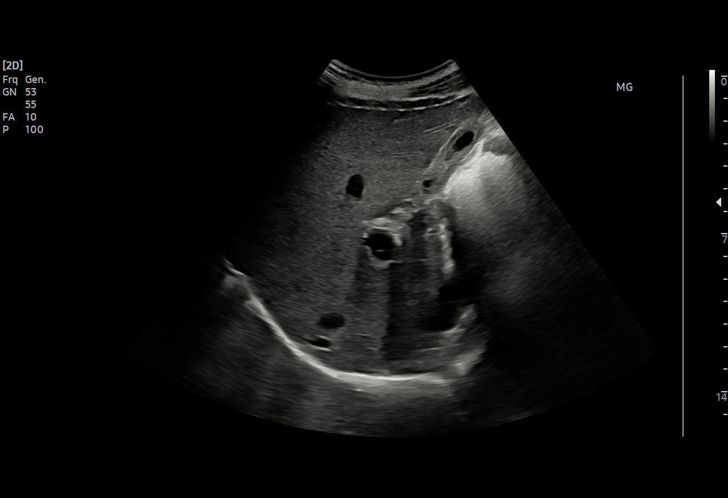
[im 31/44]
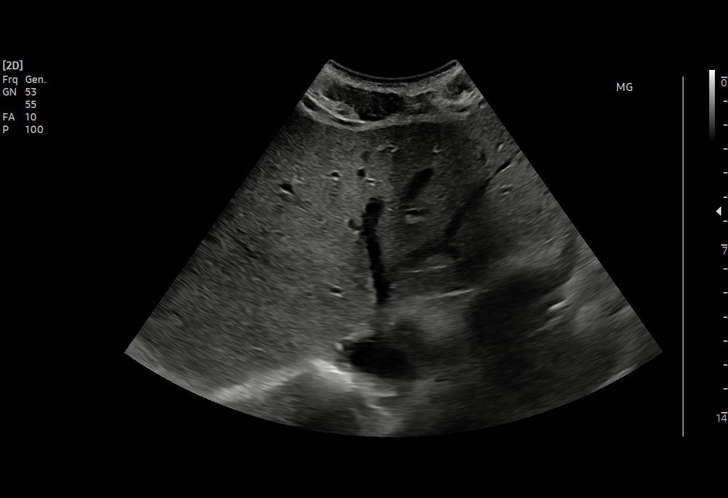
[im 35/44]
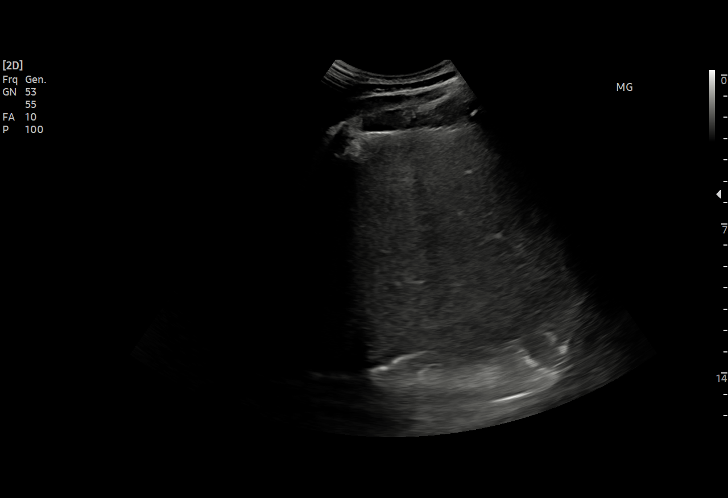
[im 36/44]
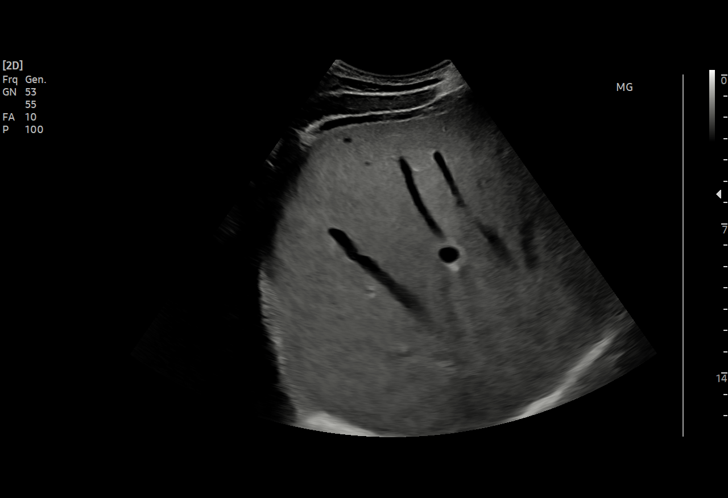
[im 40/44]
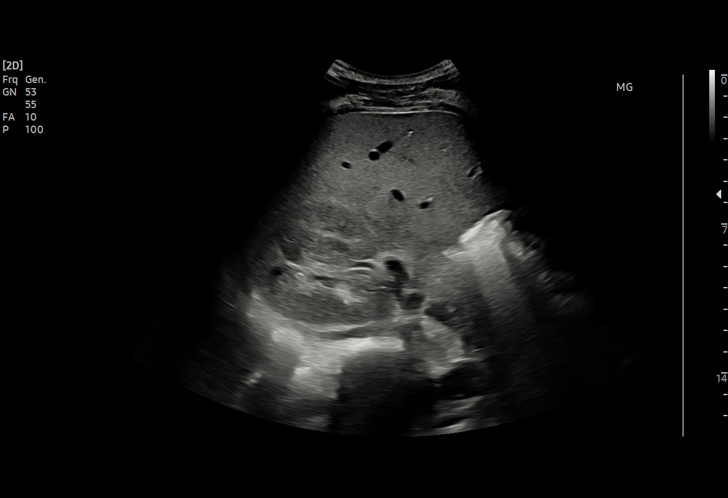
[im 44/44]
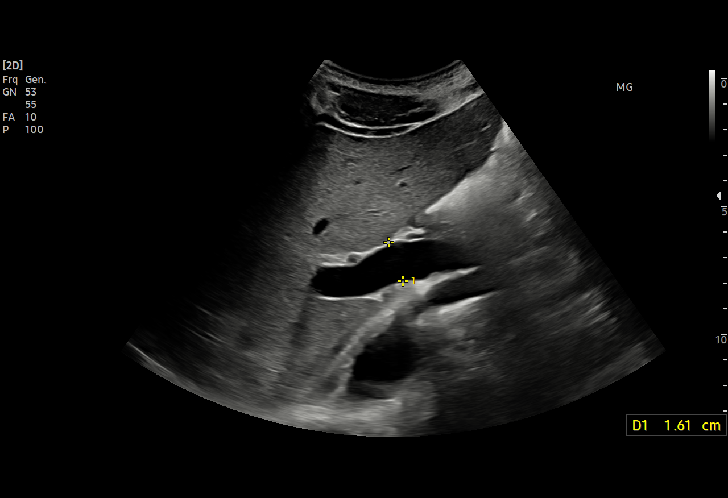

[15 of 25 positions shown; findings below may reference images not displayed]

FINDINGS: Gallbladder:

No gallstones, pericholecystic fluid or sonographic Murphy's sign.
Partially decompressed gallbladder. There is diffuse gallbladder
wall thickening which measures up to 3.6 mm.

Common bile duct:

Diameter: 6 mm.  No intrahepatic bile duct dilatation.

Liver:

Increased parenchymal echogenicity. No focal lesion identified.
Increased caliber of the main portal vein measures 1.9 cm. Portal
vein is patent on color Doppler imaging with normal direction of
blood flow towards the liver.

Other: None.
IMPRESSION: 1. Hepatic steatosis.
2. No gallstones, pericholecystic fluid or sonographic Murphy's
sign.
3. Decompressed gallbladder with mild circumferential gallbladder
wall thickening. Wall thickening is nonspecific and may reflect
incomplete distension. If there are clinical concerns for
cholecystitis consider further investigation with nuclear medicine
hepatic biliary scan.

## 2023-08-24 IMAGING — CT CT RENAL STONE PROTOCOL
2 of 4 series · 11 of 46 positions shown, 12 images · non-contrast
Comparison: No priors available for comparison.

CLINICAL DATA: 36-year-old male with history of left flank pain.
Suspected kidney stone.



[Series 3: ap without · axial · non-contrast · 0.47mm/px · z∈[-1328,-968]mm · 8 of 92 slices shown, 9 images]
[im 10/92  soft-tissue]
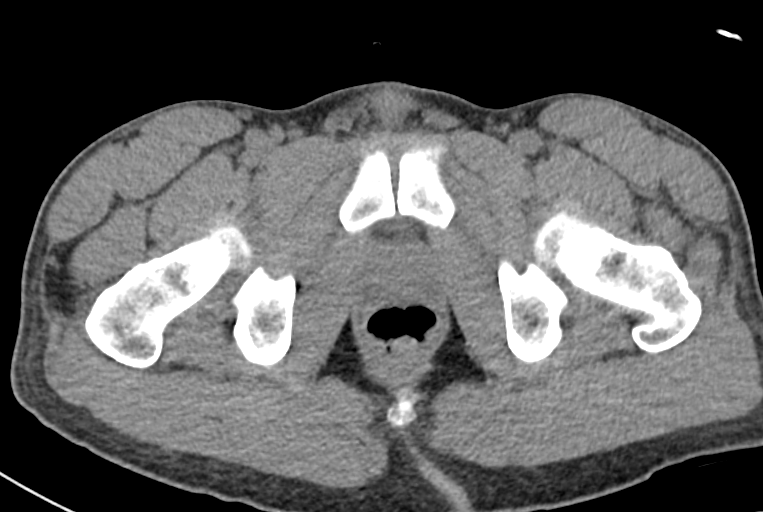
[im 10/92  bone]
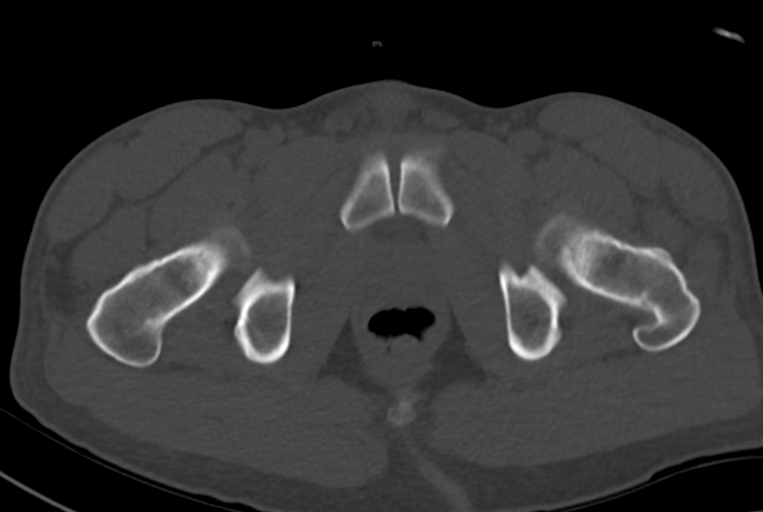
[im 20/92  soft-tissue]
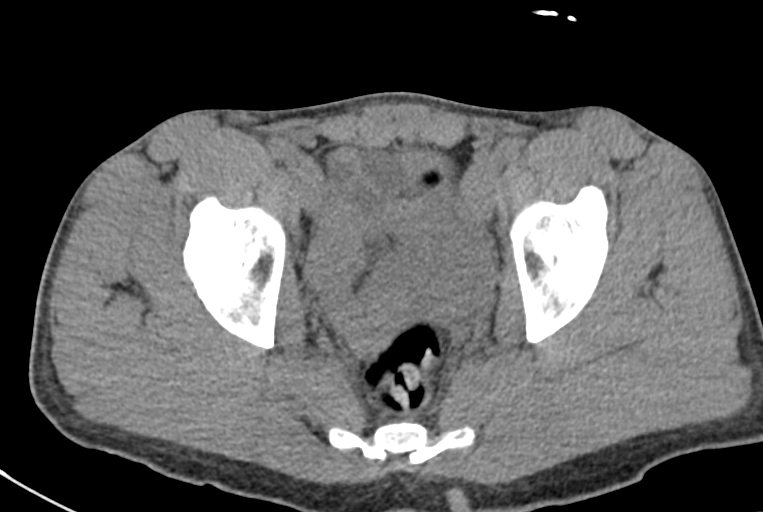
[im 29/92  soft-tissue]
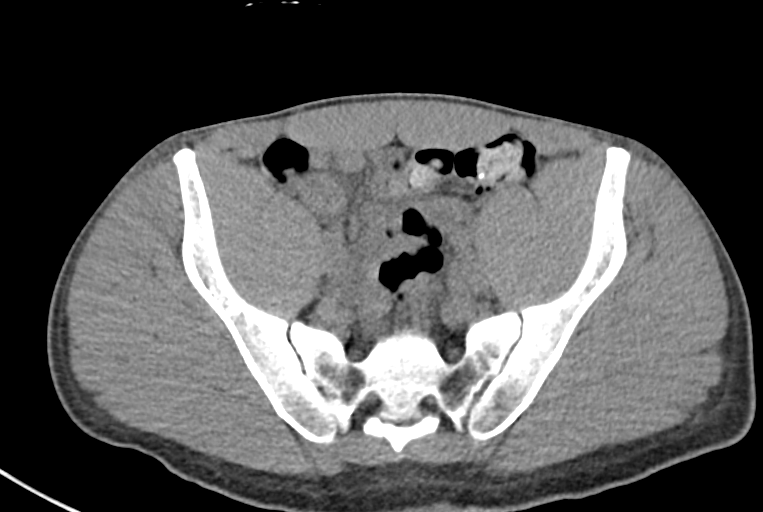
[im 39/92  soft-tissue]
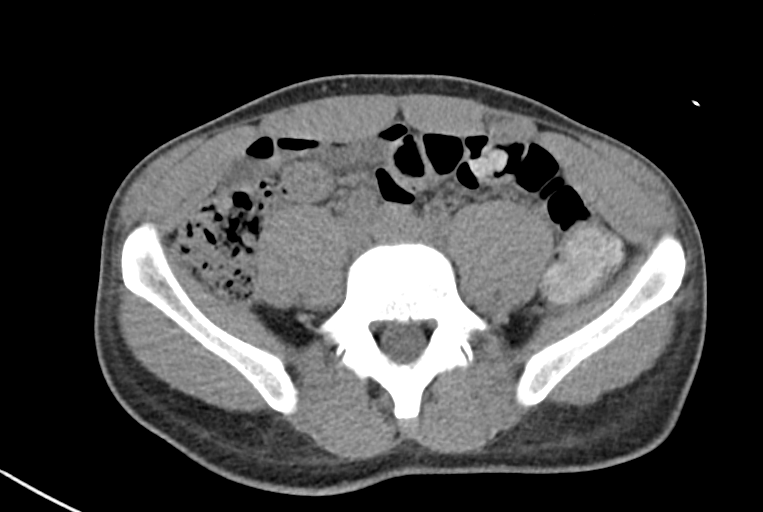
[im 53/92  soft-tissue]
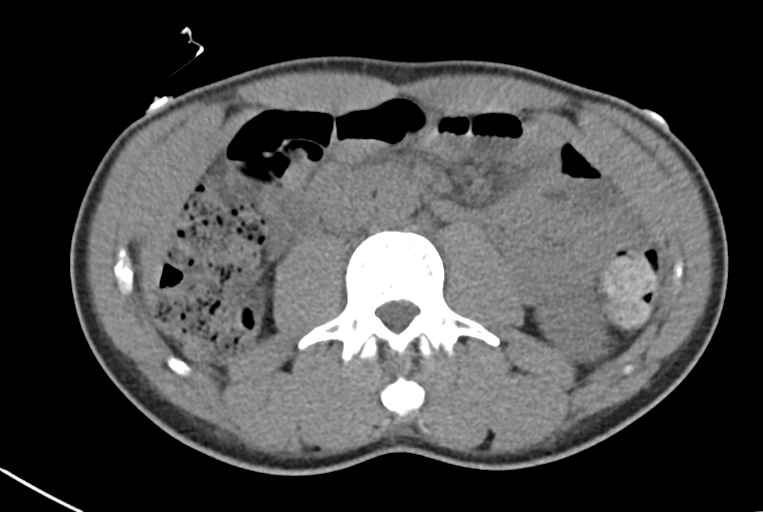
[im 63/92  soft-tissue]
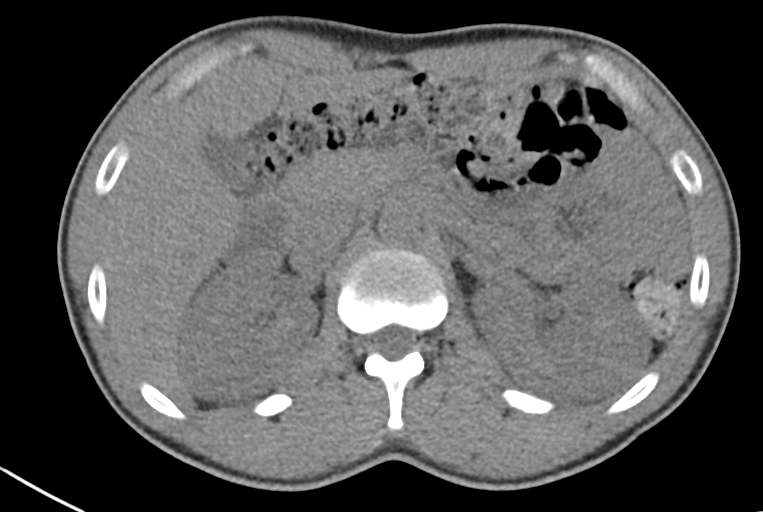
[im 72/92  soft-tissue]
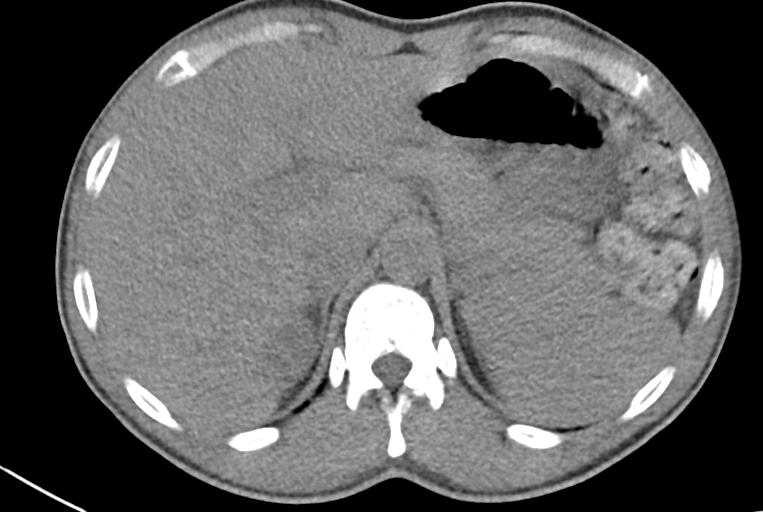
[im 82/92  soft-tissue]
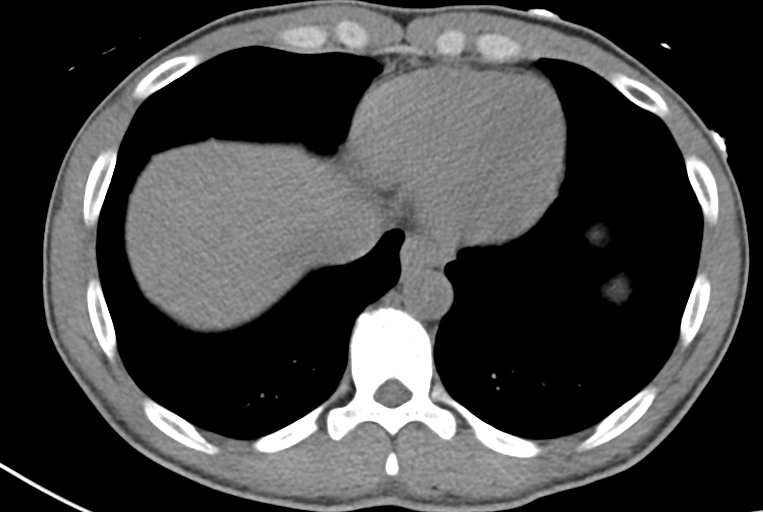

[Series 7: cor · coronal · 0.88mm/px · 3 of 86 slices shown]
[im 29/86  soft-tissue]
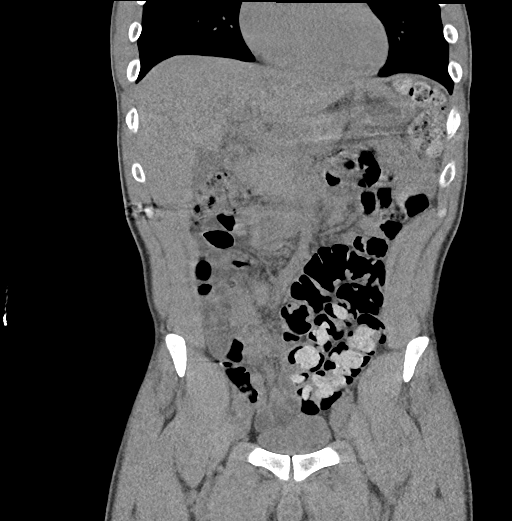
[im 38/86  soft-tissue]
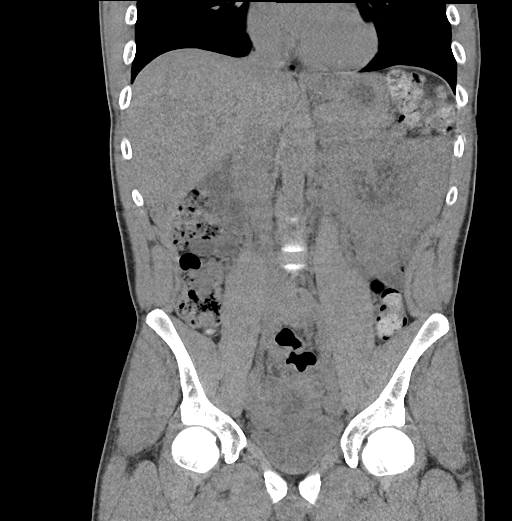
[im 48/86  soft-tissue]
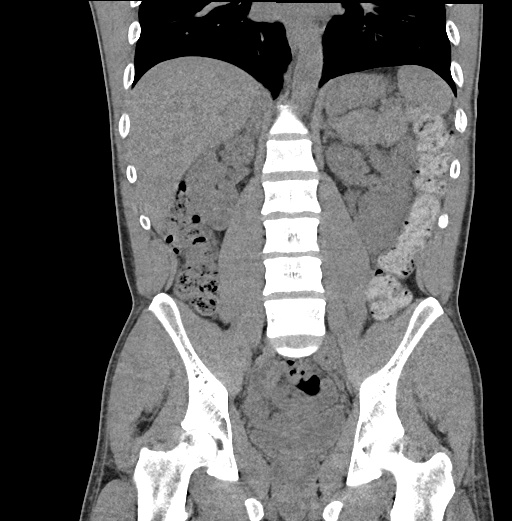

[11 of 46 positions shown; findings below may reference images not displayed]

FINDINGS: Lower chest: Unremarkable.

Hepatobiliary: No suspicious cystic or solid hepatic lesions are
confidently identified on today's noncontrast CT examination.
Unenhanced appearance of the gallbladder is normal.

Pancreas: No definite pancreatic mass or peripancreatic fluid
collections or inflammatory changes are noted on today's noncontrast
CT examination.

Spleen: Unremarkable.

Adrenals/Urinary Tract: There are no abnormal calcifications within
the collecting system of either kidney, along the course of either
ureter, or within the lumen of the urinary bladder. No
hydroureteronephrosis or perinephric stranding to suggest urinary
tract obstruction at this time. The unenhanced appearance of the
kidneys is unremarkable bilaterally. Unenhanced appearance of the
urinary bladder is normal. Bilateral adrenal glands are normal in
appearance.

Stomach/Bowel: Unenhanced appearance of the stomach is normal. There
is no pathologic dilatation of small bowel or colon. The appendix is
not confidently identified and may be surgically absent. Regardless,
there are no inflammatory changes noted adjacent to the cecum to
suggest the presence of an acute appendicitis at this time.

Vascular/Lymphatic: No atherosclerotic calcifications are noted in
the abdominal aorta or pelvic vasculature. No lymphadenopathy noted
in the abdomen or pelvis.

Reproductive: Prostate gland and seminal vesicles are unremarkable
in appearance.

Other: No significant volume of ascites.  No pneumoperitoneum.

Musculoskeletal: There are no aggressive appearing lytic or blastic
lesions noted in the visualized portions of the skeleton.
IMPRESSION: 1. No acute findings are noted in the abdomen or pelvis to account
for the patient's symptoms. Specifically, no urinary tract calculi
no findings of urinary tract obstruction are noted at this time.

## 2023-09-01 DIAGNOSIS — A419 Sepsis, unspecified organism: Principal | ICD-10-CM | POA: Insufficient documentation

## 2023-09-01 DIAGNOSIS — Z79899 Other long term (current) drug therapy: Secondary | ICD-10-CM | POA: Insufficient documentation

## 2023-09-01 DIAGNOSIS — F17210 Nicotine dependence, cigarettes, uncomplicated: Secondary | ICD-10-CM | POA: Insufficient documentation

## 2023-09-01 DIAGNOSIS — I10 Essential (primary) hypertension: Secondary | ICD-10-CM | POA: Insufficient documentation

## 2023-09-01 DIAGNOSIS — L03818 Cellulitis of other sites: Secondary | ICD-10-CM | POA: Insufficient documentation

## 2023-09-01 DIAGNOSIS — D649 Anemia, unspecified: Secondary | ICD-10-CM | POA: Insufficient documentation

## 2023-09-01 DIAGNOSIS — E871 Hypo-osmolality and hyponatremia: Secondary | ICD-10-CM | POA: Insufficient documentation

## 2023-09-01 DIAGNOSIS — B20 Human immunodeficiency virus [HIV] disease: Secondary | ICD-10-CM | POA: Insufficient documentation

## 2023-09-02 ENCOUNTER — Emergency Department (HOSPITAL_COMMUNITY): Payer: Self-pay

## 2023-09-02 ENCOUNTER — Emergency Department (HOSPITAL_BASED_OUTPATIENT_CLINIC_OR_DEPARTMENT_OTHER): Payer: Self-pay

## 2023-09-02 ENCOUNTER — Observation Stay (HOSPITAL_COMMUNITY)
Admission: EM | Admit: 2023-09-02 | Discharge: 2023-09-02 | Discharge status: Against Medical Advice | Payer: Self-pay | Attending: Internal Medicine | Admitting: Internal Medicine

## 2023-09-02 ENCOUNTER — Encounter (HOSPITAL_COMMUNITY): Payer: Self-pay

## 2023-09-02 ENCOUNTER — Other Ambulatory Visit: Payer: Self-pay

## 2023-09-02 DIAGNOSIS — M79661 Pain in right lower leg: Secondary | ICD-10-CM

## 2023-09-02 DIAGNOSIS — E871 Hypo-osmolality and hyponatremia: Secondary | ICD-10-CM

## 2023-09-02 DIAGNOSIS — D509 Iron deficiency anemia, unspecified: Secondary | ICD-10-CM

## 2023-09-02 DIAGNOSIS — L0390 Cellulitis, unspecified: Secondary | ICD-10-CM

## 2023-09-02 DIAGNOSIS — L03116 Cellulitis of left lower limb: Principal | ICD-10-CM

## 2023-09-02 DIAGNOSIS — B20 Human immunodeficiency virus [HIV] disease: Secondary | ICD-10-CM

## 2023-09-02 DIAGNOSIS — A419 Sepsis, unspecified organism: Secondary | ICD-10-CM | POA: Diagnosis present

## 2023-09-02 LAB — CBC WITH DIFFERENTIAL/PLATELET
Abs Immature Granulocytes: 0.15 10*3/uL — ABNORMAL HIGH (ref 0.00–0.07)
Basophils Absolute: 0.1 10*3/uL (ref 0.0–0.1)
Basophils Relative: 1 %
Eosinophils Absolute: 0.2 10*3/uL (ref 0.0–0.5)
Eosinophils Relative: 2 %
HCT: 28.9 % — ABNORMAL LOW (ref 39.0–52.0)
Hemoglobin: 9.7 g/dL — ABNORMAL LOW (ref 13.0–17.0)
Immature Granulocytes: 1 %
Lymphocytes Relative: 18 %
Lymphs Abs: 1.9 10*3/uL (ref 0.7–4.0)
MCH: 24.1 pg — ABNORMAL LOW (ref 26.0–34.0)
MCHC: 33.6 g/dL (ref 30.0–36.0)
MCV: 71.9 fL — ABNORMAL LOW (ref 80.0–100.0)
Monocytes Absolute: 0.6 10*3/uL (ref 0.1–1.0)
Monocytes Relative: 6 %
Neutro Abs: 7.8 10*3/uL — ABNORMAL HIGH (ref 1.7–7.7)
Neutrophils Relative %: 72 %
Platelets: 477 10*3/uL — ABNORMAL HIGH (ref 150–400)
RBC: 4.02 MIL/uL — ABNORMAL LOW (ref 4.22–5.81)
RDW: 15.6 % — ABNORMAL HIGH (ref 11.5–15.5)
WBC: 10.6 10*3/uL — ABNORMAL HIGH (ref 4.0–10.5)
nRBC: 0 % (ref 0.0–0.2)

## 2023-09-02 LAB — COMPREHENSIVE METABOLIC PANEL
ALT: 13 U/L (ref 0–44)
AST: 16 U/L (ref 15–41)
Albumin: 3 g/dL — ABNORMAL LOW (ref 3.5–5.0)
Alkaline Phosphatase: 64 U/L (ref 38–126)
Anion gap: 9 (ref 5–15)
BUN: 10 mg/dL (ref 6–20)
CO2: 27 mmol/L (ref 22–32)
Calcium: 9 mg/dL (ref 8.9–10.3)
Chloride: 98 mmol/L (ref 98–111)
Creatinine, Ser: 0.85 mg/dL (ref 0.61–1.24)
GFR, Estimated: >60 mL/min (ref >60)
Glucose, Bld: 99 mg/dL (ref 70–99)
Potassium: 4 mmol/L (ref 3.5–5.1)
Sodium: 134 mmol/L — ABNORMAL LOW (ref 135–145)
Total Bilirubin: 0.2 mg/dL (ref <1.2)
Total Protein: 8.5 g/dL — ABNORMAL HIGH (ref 6.5–8.1)

## 2023-09-02 LAB — I-STAT CG4 LACTIC ACID, ED: Lactic Acid, Venous: 1.4 mmol/L (ref 0.5–1.9)

## 2023-09-02 LAB — URINALYSIS, W/ REFLEX TO CULTURE (INFECTION SUSPECTED)
Bacteria, UA: NONE SEEN
Bilirubin Urine: NEGATIVE
Glucose, UA: NEGATIVE mg/dL
Hgb urine dipstick: NEGATIVE
Ketones, ur: NEGATIVE mg/dL
Leukocytes,Ua: NEGATIVE
Nitrite: NEGATIVE
Protein, ur: NEGATIVE mg/dL
Specific Gravity, Urine: 1.019 (ref 1.005–1.030)
pH: 7 (ref 5.0–8.0)

## 2023-09-02 LAB — PROTIME-INR
INR: 1.1 (ref 0.8–1.2)
Prothrombin Time: 14.2 s (ref 11.4–15.2)

## 2023-09-02 IMAGING — CT CT HEAD W/O CM
4 series · 16 of 47 positions shown, 18 images · non-contrast
Comparison: Head CT dated 10/16/2019.

CLINICAL DATA: Delirium.



[Series 3: head wo · axial · 0.47mm/px · z∈[+1188,+1322]mm · 7 of 37 slices shown, 9 images]
[im 5/37  brain]
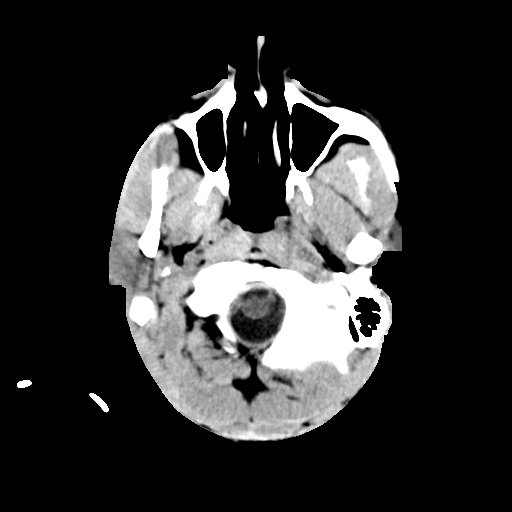
[im 5/37  bone]
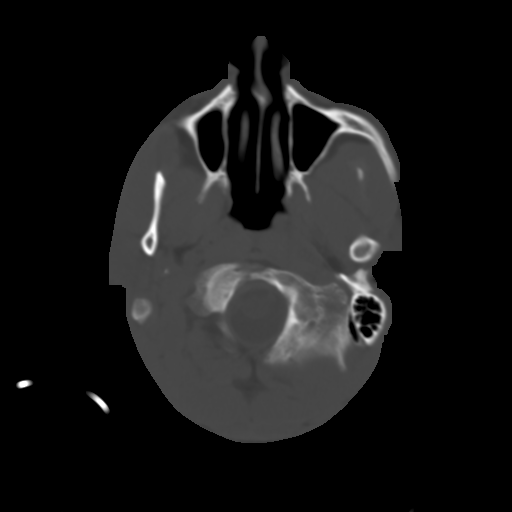
[im 10/37  brain]
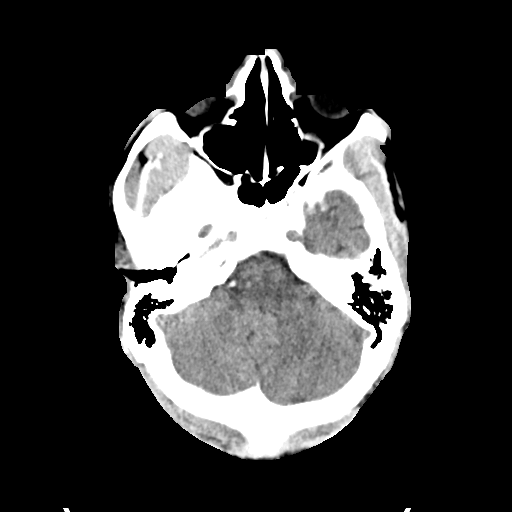
[im 14/37  brain]
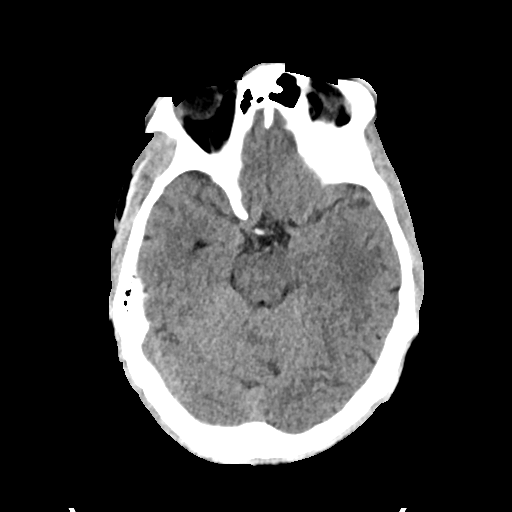
[im 19/37  brain]
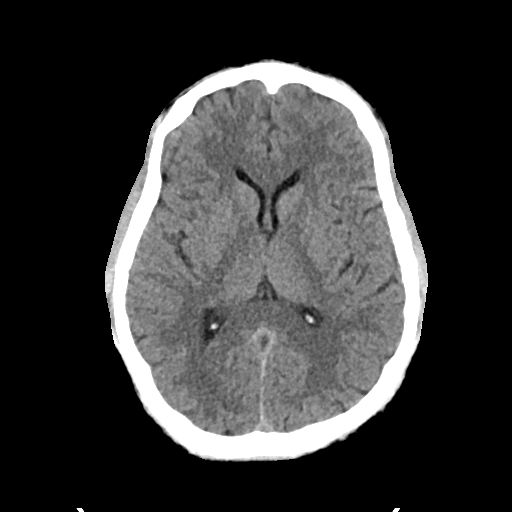
[im 23/37  brain]
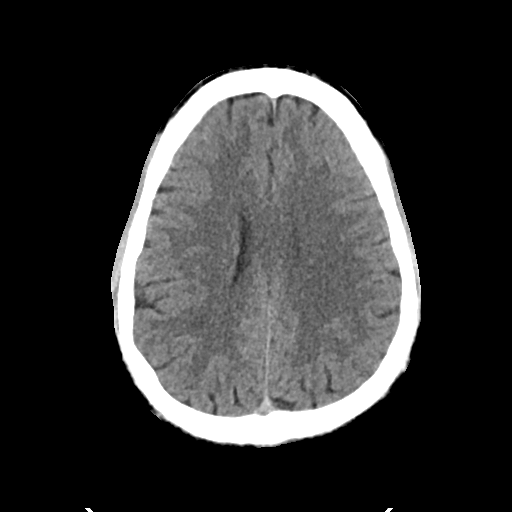
[im 23/37  bone]
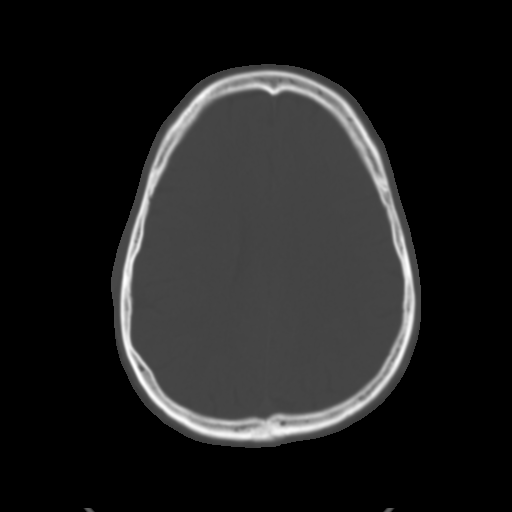
[im 28/37  brain]
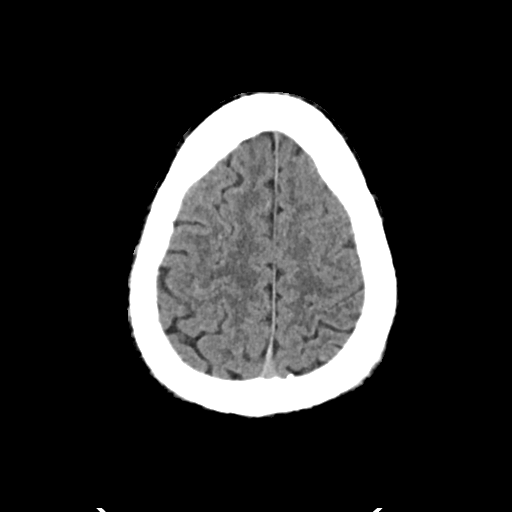
[im 32/37  brain]
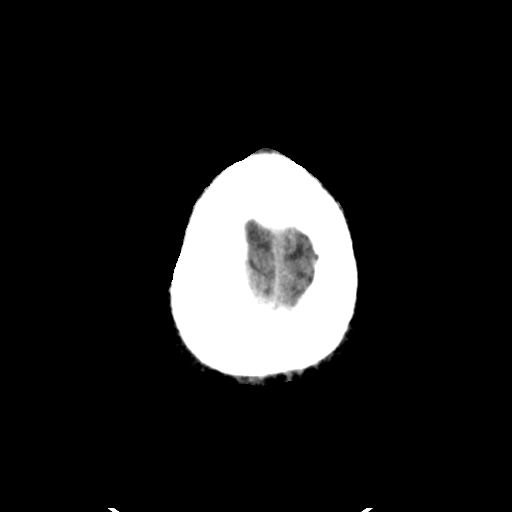

[Series 4: head bone · axial · 0.47mm/px · z∈[+1186,+1222]mm · 3 of 93 slices shown]
[im 10/93  bone]
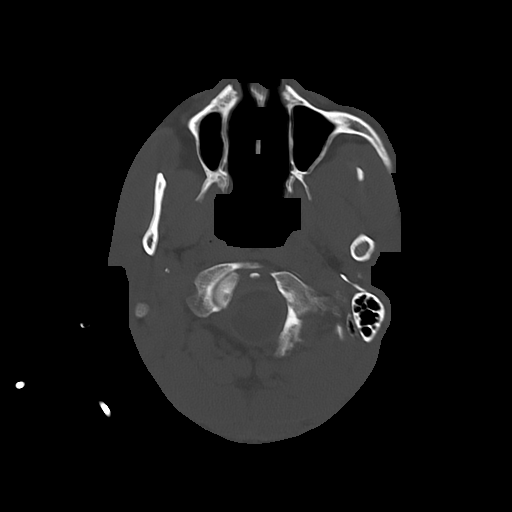
[im 19/93  bone]
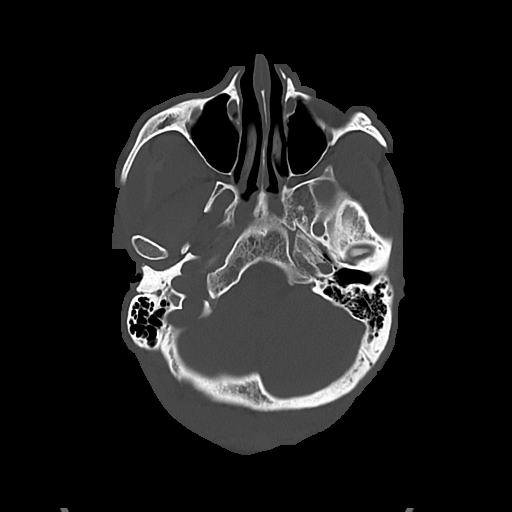
[im 28/93  bone]
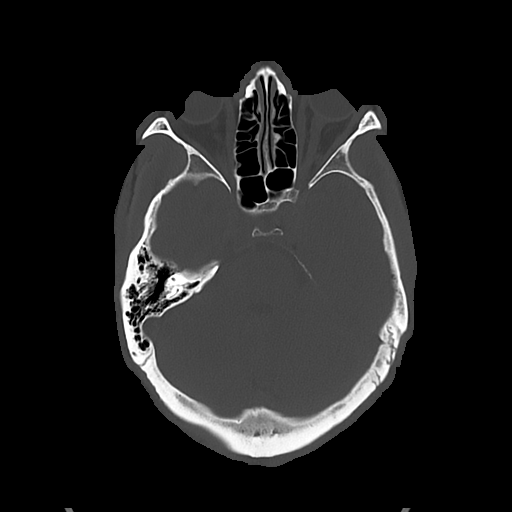

[Series 5: cor soft · coronal · 0.37mm/px · 3 of 80 slices shown]
[im 27/80  brain]
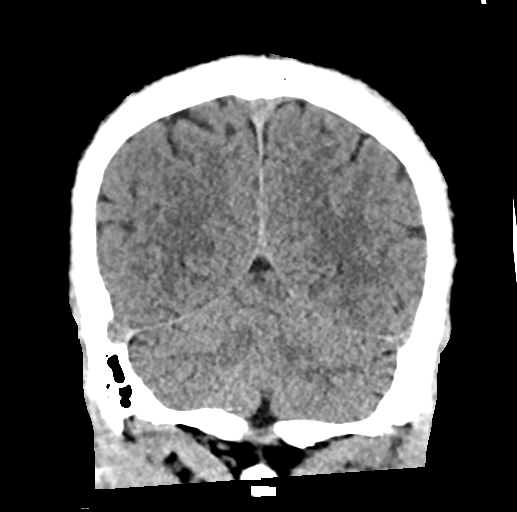
[im 36/80  brain]
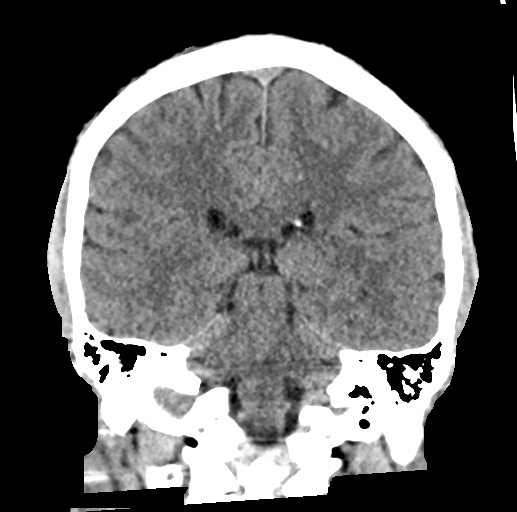
[im 44/80  brain]
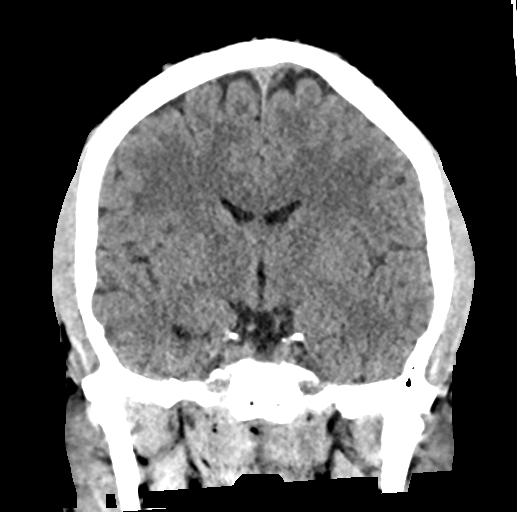

[Series 6: sag soft · sagittal · 0.37mm/px · 3 of 64 slices shown]
[im 22/64  brain]
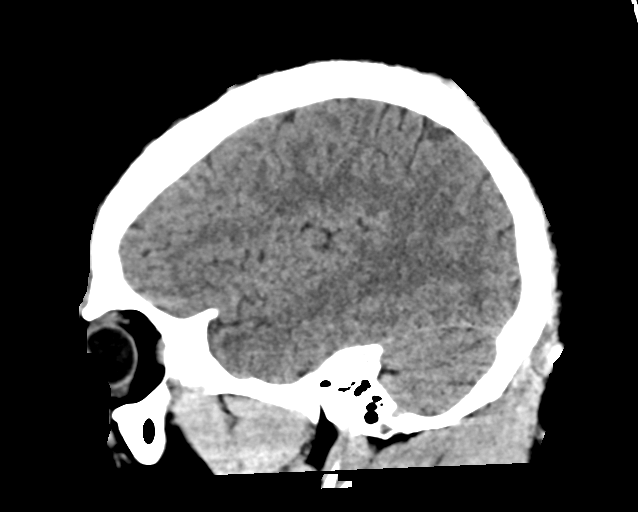
[im 32/64  brain]
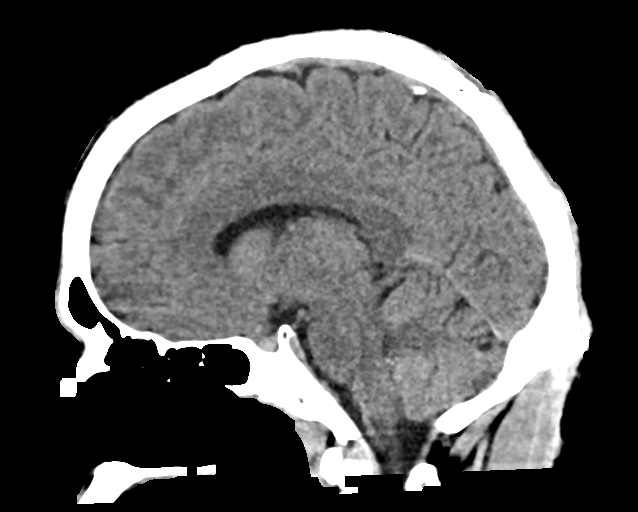
[im 43/64  brain]
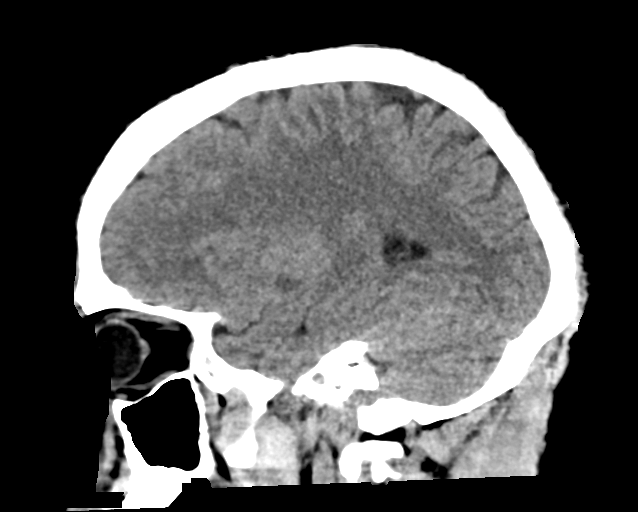

[16 of 47 positions shown; findings below may reference images not displayed]

FINDINGS: Brain: The ventricles and sulci are appropriate size for the
patient's age. The gray-white matter discrimination is preserved.
There is no acute intracranial hemorrhage. No mass effect or midline
shift. No extra-axial fluid collection.

Vascular: No hyperdense vessel or unexpected calcification.

Skull: Normal. Negative for fracture or focal lesion.

Sinuses/Orbits: Age indeterminate mildly depressed fracture of the
left nasal bone. Correlation with clinical exam and point tenderness
recommended. The visualized paranasal sinuses and mastoid air cells
are otherwise clear.

Other: None
IMPRESSION: 1. No acute intracranial pathology.
2. Age indeterminate mildly depressed fracture of the left nasal
bone. Correlation with clinical exam and point tenderness
recommended.

## 2023-09-02 IMAGING — DX DG CHEST 1V PORT
1 series · 1 of 1 positions shown · non-contrast
Comparison: 10/31/2018

CLINICAL DATA: Altered mental status, recent trauma, initial
encounter

EXAM:
PORTABLE CHEST 1 VIEW

[chest]
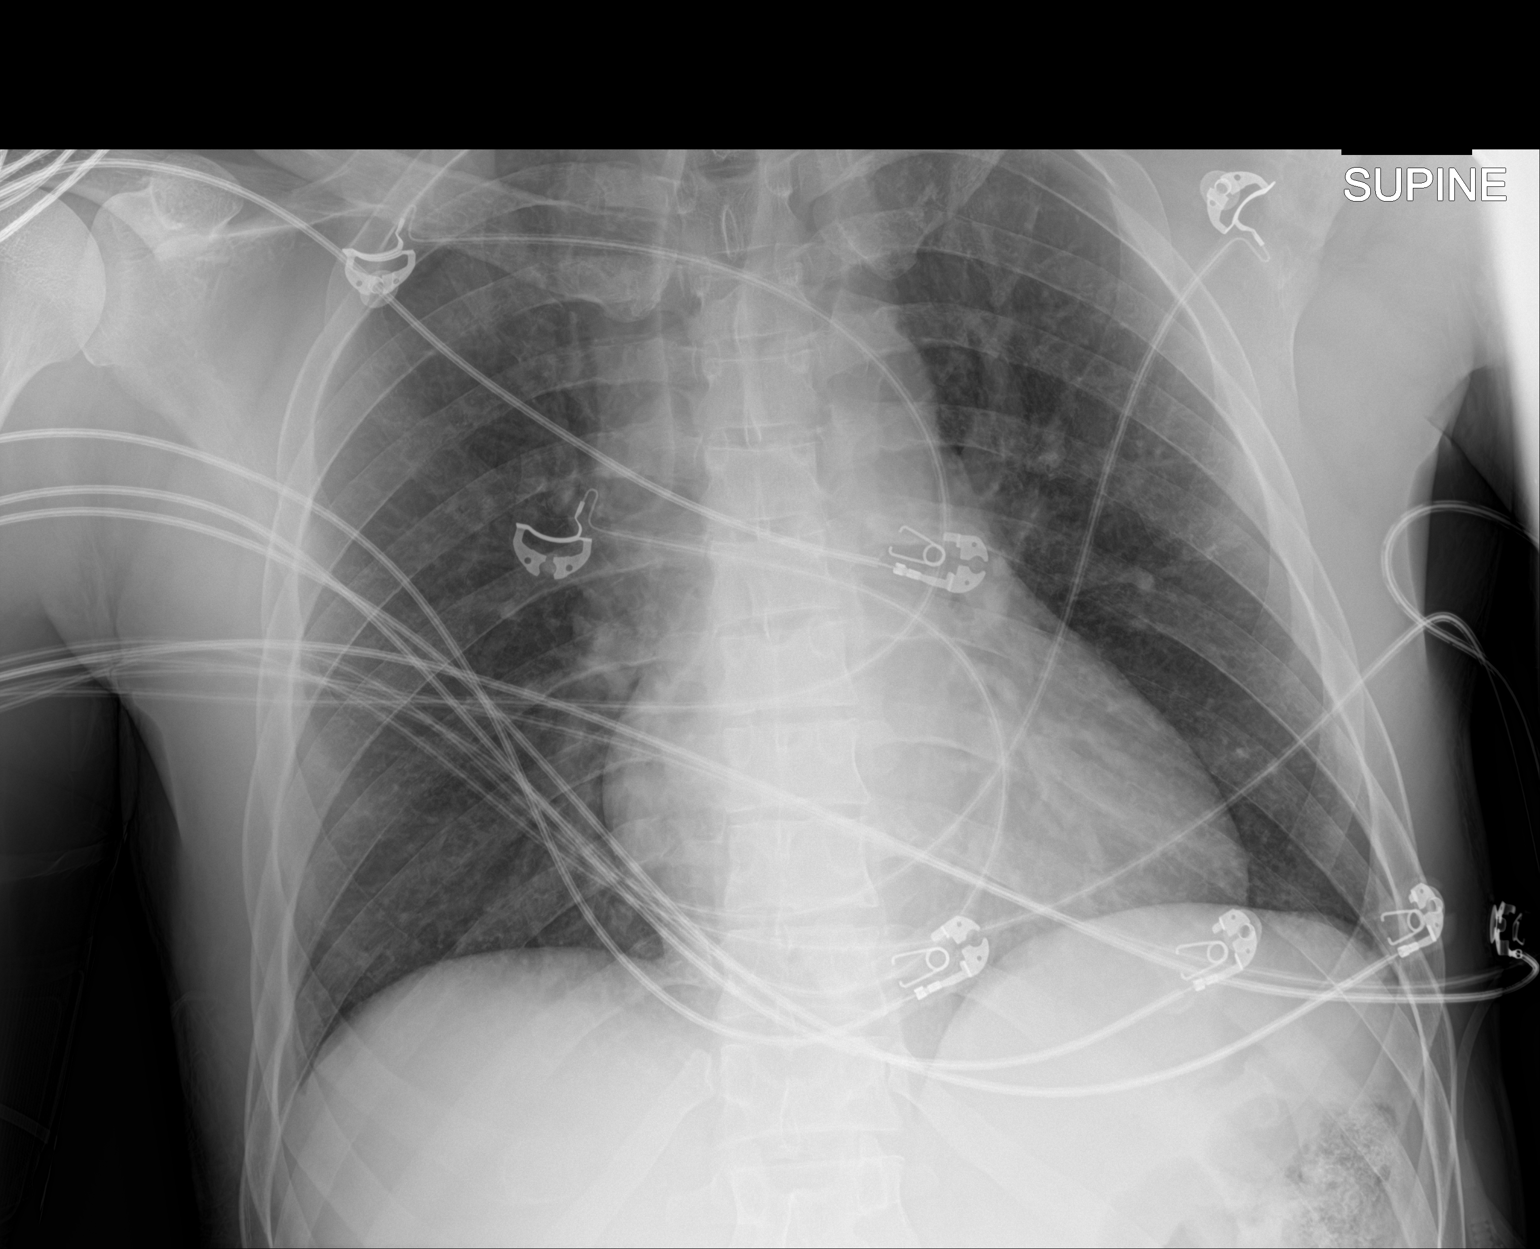

[1 of 1 positions shown; findings below may reference images not displayed]

FINDINGS: Cardiac shadow is prominent but accentuated by the portable
technique. No focal infiltrate or effusion is seen. No bony
abnormality is noted.
IMPRESSION: No acute abnormality seen

## 2023-09-02 MED ORDER — ONDANSETRON HCL 4 MG/2ML IJ SOLN: 4.0000 mg | Freq: Four times a day (QID) | INTRAMUSCULAR | Status: DC | PRN

## 2023-09-02 MED ORDER — ONDANSETRON HCL 4 MG/2ML IJ SOLN
4.0000 mg | Freq: Once | INTRAMUSCULAR | Status: AC
  Administered 2023-09-02: 4 mg via INTRAVENOUS
  Filled 2023-09-02: qty 2

## 2023-09-02 MED ORDER — SODIUM CHLORIDE 0.9 % IV SOLN
2.0000 g | Freq: Once | INTRAVENOUS | Status: AC
  Administered 2023-09-02: 2 g via INTRAVENOUS
  Filled 2023-09-02: qty 20

## 2023-09-02 MED ORDER — MORPHINE SULFATE (PF) 2 MG/ML IV SOLN
2.0000 mg | INTRAVENOUS | Status: DC | PRN
  Administered 2023-09-02: 2 mg via INTRAVENOUS
  Filled 2023-09-02: qty 1

## 2023-09-02 MED ORDER — ACETAMINOPHEN 325 MG PO TABS
650.0000 mg | ORAL_TABLET | ORAL | Status: DC | PRN
  Administered 2023-09-02: 650 mg via ORAL
  Filled 2023-09-02: qty 2

## 2023-09-02 MED ORDER — SODIUM CHLORIDE 0.9 % IV SOLN: 1.0000 g | INTRAVENOUS | Status: DC

## 2023-09-02 MED ORDER — ONDANSETRON HCL 4 MG PO TABS: 4.0000 mg | ORAL_TABLET | Freq: Four times a day (QID) | ORAL | Status: DC | PRN

## 2023-09-02 MED ORDER — BICTEGRAVIR-EMTRICITAB-TENOFOV 50-200-25 MG PO TABS
1.0000 | ORAL_TABLET | Freq: Every day | ORAL | Status: DC
  Administered 2023-09-02: 1 via ORAL
  Filled 2023-09-02: qty 1

## 2023-09-02 MED ORDER — MORPHINE SULFATE (PF) 4 MG/ML IV SOLN
4.0000 mg | INTRAVENOUS | Status: DC | PRN
  Administered 2023-09-02 (×2): 4 mg via INTRAVENOUS
  Filled 2023-09-02 (×2): qty 1

## 2023-09-02 MED ORDER — MORPHINE SULFATE (PF) 4 MG/ML IV SOLN: 4.0000 mg | Freq: Once | INTRAVENOUS | Status: DC

## 2023-09-02 MED ORDER — OXYCODONE HCL 5 MG PO TABS
5.0000 mg | ORAL_TABLET | ORAL | Status: DC | PRN
  Administered 2023-09-02 (×2): 5 mg via ORAL
  Filled 2023-09-02 (×2): qty 1

## 2023-09-02 MED ORDER — ENOXAPARIN SODIUM 40 MG/0.4ML IJ SOSY
40.0000 mg | PREFILLED_SYRINGE | INTRAMUSCULAR | Status: DC
  Administered 2023-09-02: 40 mg via SUBCUTANEOUS
  Filled 2023-09-02: qty 0.4

## 2023-09-02 MED ORDER — FOLIC ACID 1 MG PO TABS
1.0000 mg | ORAL_TABLET | Freq: Every day | ORAL | Status: DC
  Administered 2023-09-02: 1 mg via ORAL
  Filled 2023-09-02: qty 1

## 2023-09-02 MED ORDER — HYDROMORPHONE HCL 1 MG/ML IJ SOLN
1.0000 mg | Freq: Once | INTRAMUSCULAR | Status: AC
  Administered 2023-09-02: 1 mg via INTRAVENOUS
  Filled 2023-09-02: qty 1

## 2023-09-02 MED ORDER — ALBUTEROL SULFATE (2.5 MG/3ML) 0.083% IN NEBU: 2.5000 mg | INHALATION_SOLUTION | RESPIRATORY_TRACT | Status: DC | PRN

## 2023-09-02 MED ORDER — ENOXAPARIN SODIUM 100 MG/ML IJ SOSY
1.0000 mg/kg | PREFILLED_SYRINGE | Freq: Once | INTRAMUSCULAR | Status: AC
  Administered 2023-09-02: 92.5 mg via SUBCUTANEOUS
  Filled 2023-09-02: qty 1

## 2023-09-02 MED ORDER — TRAZODONE HCL 100 MG PO TABS: 50.0000 mg | ORAL_TABLET | Freq: Every evening | ORAL | Status: DC | PRN

## 2023-09-02 NOTE — ED Provider Notes (Signed)
[place] EMERGENCY DEPARTMENT AT [facility] Provider Note   CSN: [id number] Arrival date & time: 09/01/23  2347     History  Chief Complaint  Patient presents with   Leg Pain    Tristan [name] is a 37 y.o. male.  The history is provided by the patient.  Leg Pain He has history of bipolar disorder, polysubstance abuse, hepatitis C, HIV and comes in because of pain and swelling in his left leg for the last 2 days.  He has had subjective fever along with chills and sweats.  He denies chest pain or shortness of breath.  He denies any trauma to his leg.  Of note, he admits that he has been out of his HIV medication for the last week, states he was just diagnosed with HIV last April.  He denies current drug use.   Home Medications Prior to Admission medications   Medication Sig Start Date End Date Taking? Authorizing Provider  bictegravir-emtricitabine-tenofovir AF (BIKTARVY) 50-200-25 MG TABS tablet Take 1 tablet by mouth daily. 06/12/23   Laurence Spates, MD  folic acid (FOLVITE) 1 MG tablet Take 1 tablet (1 mg total) by mouth daily. Patient not taking: Reported on 02/27/2022 02/23/22   Joycelyn Das, MD  hydrocortisone cream 1 % Apply topically 2 (two) times daily as needed for itching. 06/11/23   Cristopher Peru, PA-C  naloxone [facility]) 0.4 MG/ML injection Administer in nose as needed for opioid overdose. 06/11/23   Laurence Spates, MD      Allergies    Patient has no known allergies.    Review of Systems   Review of Systems  All other systems reviewed and are negative.   Physical Exam Updated Vital Signs BP (!) 141/87   Pulse (!) 105   Temp 99.1 F (37.3 C) (Oral)   Resp 18   Ht 5\' 10"  (1.778 m) Comment: Simultaneous filing. User may not have seen previous data.  Wt 93.7 kg   SpO2 100%   BMI 29.64 kg/m  Physical Exam Vitals and nursing note reviewed.   37 year old male, resting comfortably and in no acute distress. Vital signs are  significant for elevated blood pressure. Oxygen saturation is 100%, which is normal. Head is normocephalic and atraumatic. PERRLA, EOMI. Oropharynx is clear. Neck is nontender and supple without adenopathy. Lungs are clear without rales, wheezes, or rhonchi. Chest is nontender. Heart has regular rate and rhythm without murmur. Abdomen is soft, flat, nontender. Extremities: Left lower leg is erythematous, warm to touch, diffusely tender and significantly swollen compared with the right.  Left thigh is not erythematous but is still tender to the touch an is still swollen with left thigh circumference 5 cm greater than right thigh circumference.  There is trace edema on the right.  There is no tenderness to palpation over the inguinal vessels. Skin is warm and dry without rash. Neurologic: Mental status is normal, moves all extremities equally.    ED Results / Procedures / Treatments   Labs (all labs ordered are listed, but only abnormal results are displayed) Labs Reviewed  CBC WITH DIFFERENTIAL/PLATELET - Abnormal; Notable for the following components:      Result Value   WBC 10.6 (*)    RBC 4.02 (*)    Hemoglobin 9.7 (*)    HCT 28.9 (*)    MCV 71.9 (*)    MCH 24.1 (*)    RDW 15.6 (*)    Platelets 477 (*)  Neutro Abs 7.8 (*)    Abs Immature Granulocytes 0.15 (*)    All other components within normal limits  COMPREHENSIVE METABOLIC PANEL - Abnormal; Notable for the following components:   Sodium 134 (*)    Total Protein 8.5 (*)    Albumin 3.0 (*)    All other components within normal limits  CULTURE, BLOOD (ROUTINE X 2)  CULTURE, BLOOD (ROUTINE X 2)  PROTIME-INR  URINALYSIS, W/ REFLEX TO CULTURE (INFECTION SUSPECTED)  I-STAT CG4 LACTIC ACID, ED   Radiology DG Chest 2 View  Result Date: 09/02/2023 CLINICAL DATA:  Bilateral leg swelling for 2 days, initial encounter EXAM: CHEST - 2 VIEW COMPARISON:  06/08/2023 FINDINGS: The heart size and mediastinal contours are within  normal limits. Both lungs are clear. The visualized skeletal structures are unremarkable. IMPRESSION: No active cardiopulmonary disease. Electronically Signed   By: Alcide Clever M.D.   On: 09/02/2023 01:09    Procedures Procedures  Cardiac monitor shows normal sinus rhythm, per my interpretation.  Medications Ordered in ED Medications  enoxaparin (LOVENOX) injection 92.5 mg (has no administration in time range)  cefTRIAXone (ROCEPHIN) 2 g in sodium chloride 0.9 % 100 mL IVPB (has no administration in time range)  bictegravir-emtricitabine-tenofovir AF (BIKTARVY) 50-200-25 MG per tablet 1 tablet (has no administration in time range)  folic acid (FOLVITE) tablet 1 mg (has no administration in time range)  morphine (PF) 4 MG/ML injection 4 mg (has no administration in time range)  acetaminophen (TYLENOL) tablet 650 mg (has no administration in time range)    ED Course/ Medical Decision Making/ A&P   Lower                             Medical Decision Making Amount and/or Complexity of Data Reviewed Labs: ordered. Radiology: ordered.   Cellulitis of the left leg.  Differential diagnosis does include DVT.  No symptoms to suggest pulmonary embolism.  I have reviewed his past records, and last HIV blood work was done on 06/10/2023 at which time his CD4 count was low at 365 and HIV 1 RNA quantitative was elevated at 2510.  With his noncompliance with HIV medications, I need to consider him immunosuppressed.  I have reviewed his laboratory test today and my interpretation is mild hyponatremia which I do not feel is clinically significant, hypoalbuminemia which is likely nutritional, mild leukocytosis which is nonspecific, microcytic anemia which has progressed compared with 06/11/2023, mild thrombocytosis which is likely reactive.  Chest x-ray shows no acute cardiopulmonary process.  Have independently viewed the images, and agree with radiologist's interpretation.  I have ordered a dose of enoxaparin  to treat possible DVT and I have ordered initial dose of ceftriaxone to treat cellulitis.  I feel he will need to be admitted.  I have discussed case with Dr. Julian Reil of [facility], who agrees to admit the patient.  Final Clinical Impression(s) / ED Diagnoses Final diagnoses:  Cellulitis of left leg  HIV disease (HCC)  Hyponatremia  Microcytic anemia    Rx / DC Orders ED Discharge Orders     None         Dione Booze, MD 09/02/23 [phone]

## 2023-09-02 NOTE — ED Notes (Signed)
Family at bedside meal tray at bedside

## 2023-09-02 NOTE — ED Notes (Signed)
MD notified and is okay with the pt. Leaving AMA I will take his IV out and sign AMA papers

## 2023-09-02 NOTE — ED Triage Notes (Signed)
Pt reports with left leg pain, swelling, and redness. Pt has wounds to his lower leg.

## 2023-09-02 NOTE — ED Notes (Signed)
Pt is informed of the risk and benefits of staying.  He is also aware that if he needs to come back he can come back

## 2023-09-02 NOTE — Discharge Summary (Signed)
Discharge Summary  Tristan [name]:[id number] DOB: Jul 06, 1986  PCP: Pcp, No  Admit date: 09/02/2023 Discharge date: 09/02/2023  Recommendations for Outpatient Follow-up:  Return to ER for worsening symptoms.  Discharge Diagnoses:  Active Hospital Problems   Diagnosis Date Noted   Sepsis due to cellulitis [facility]) 09/02/2023    Resolved Hospital Problems  No resolved problems to display.   Discharge Condition: AMA  Diet recommendation: Diet Orders (From admission, onward)     Start     Ordered   09/02/23 0145  Diet regular Room service appropriate? Yes; Fluid consistency: Thin  Diet effective now       Question Answer Comment  Room service appropriate? Yes   Fluid consistency: Thin      09/02/23 0145           HPI and Brief Hospital Course:  37 year old male with history of polysubstance abuse, bipolar 1 disorder, HIV admitted to the hospital with sepsis due to left lower extremity cellulitis.  Was started on empiric IV antibiotics.  Stable.  Normal mental status.  Was notified this afternoon that patient suddenly has decided he will leave AMA.  No discharge instructions or prescriptions were given, as the patient left AGAINST MEDICAL ADVICE.  Discharge Exam: BP (!) 143/79 (BP Location: Left Arm)   Pulse 73   Temp 98.2 F (36.8 C) (Oral)   Resp 16   Ht 5\' 10"  (1.778 m) Comment: Simultaneous filing. User may not have seen previous data.  Wt 93.7 kg   SpO2 100%   BMI 29.64 kg/m  None  Discharge Instructions You were cared for by a hospitalist during your hospital stay. If you have any questions about your discharge medications or the care you received while you were in the hospital after you are discharged, you can call the unit and asked to speak with the hospitalist on call if the hospitalist that took care of you is not available. Once you are discharged, your primary care physician will handle any further medical issues. Please note that NO REFILLS  for any discharge medications will be authorized once you are discharged, as it is imperative that you return to your primary care physician (or establish a relationship with a primary care physician if you do not have one) for your aftercare needs so that they can reassess your need for medications and monitor your lab values.    No Active Allergies   The results of significant diagnostics from this hospitalization (including imaging, microbiology, ancillary and laboratory) are listed below for reference.    Significant Diagnostic Studies: VAS [place] LOWER EXTREMITY VENOUS (DVT) (ONLY [facility] & [facility])  Result Date: 09/02/2023  Lower Venous DVT Study Patient Name:  Tristan [name]  Date of Exam:   09/02/2023 Medical Rec #: [id number]                  Accession #:    [id number] Date of Birth: 06/21/1986                  Patient Gender: M Patient Age:   37 years Exam Location:  [facility] Procedure:      VAS [place] LOWER EXTREMITY VENOUS (DVT) Referring Phys: Dione Booze --------------------------------------------------------------------------------  Indications: Pain.  Risk Factors: None identified. Limitations: Poor ultrasound/tissue interface. Comparison Study: No prior studies. Performing Technologist: Chanda Busing RVT  Examination Guidelines: A complete evaluation includes B-mode imaging, spectral Doppler, color Doppler, and power Doppler as needed of all accessible portions of each  vessel. Bilateral testing is considered an integral part of a complete examination. Limited examinations for reoccurring indications may be performed as noted. The reflux portion of the exam is performed with the patient in reverse Trendelenburg.  +---------+---------------+---------+-----------+----------+--------------+ RIGHT    CompressibilityPhasicitySpontaneityPropertiesThrombus Aging +---------+---------------+---------+-----------+----------+--------------+ CFV      Full           Yes      Yes                                  +---------+---------------+---------+-----------+----------+--------------+ SFJ      Full                                                        +---------+---------------+---------+-----------+----------+--------------+ FV Prox  Full                                                        +---------+---------------+---------+-----------+----------+--------------+ FV Mid   Full                                                        +---------+---------------+---------+-----------+----------+--------------+ FV DistalFull                                                        +---------+---------------+---------+-----------+----------+--------------+ PFV      Full                                                        +---------+---------------+---------+-----------+----------+--------------+ POP      Full           Yes      Yes                                 +---------+---------------+---------+-----------+----------+--------------+ PTV      Full                                                        +---------+---------------+---------+-----------+----------+--------------+ PERO     Full                                                        +---------+---------------+---------+-----------+----------+--------------+   +---------+---------------+---------+-----------+----------+--------------+ LEFT     CompressibilityPhasicitySpontaneityPropertiesThrombus Aging +---------+---------------+---------+-----------+----------+--------------+  CFV      Full           Yes      Yes                                 +---------+---------------+---------+-----------+----------+--------------+ SFJ      Full                                                        +---------+---------------+---------+-----------+----------+--------------+ FV Prox  Full                                                         +---------+---------------+---------+-----------+----------+--------------+ FV Mid   Full                                                        +---------+---------------+---------+-----------+----------+--------------+ FV Distal               Yes      Yes                                 +---------+---------------+---------+-----------+----------+--------------+ PFV      Full                                                        +---------+---------------+---------+-----------+----------+--------------+ POP      Full           Yes      Yes                                 +---------+---------------+---------+-----------+----------+--------------+ PTV      Full                                                        +---------+---------------+---------+-----------+----------+--------------+ PERO     Full                                                        +---------+---------------+---------+-----------+----------+--------------+     Summary: RIGHT: - There is no evidence of deep vein thrombosis in the lower extremity.  - No cystic structure found in the popliteal fossa.  LEFT: - There is no evidence of deep vein thrombosis in the lower extremity. However, portions of this examination were limited- see technologist comments above.  -  No cystic structure found in the popliteal fossa.  *See table(s) above for measurements and observations. Electronically signed by Gerarda Fraction on 09/02/2023 at 9:59:36 AM.    Final    DG Chest 2 View  Result Date: 09/02/2023 CLINICAL DATA:  Bilateral leg swelling for 2 days, initial encounter EXAM: CHEST - 2 VIEW COMPARISON:  06/08/2023 FINDINGS: The heart size and mediastinal contours are within normal limits. Both lungs are clear. The visualized skeletal structures are unremarkable. IMPRESSION: No active cardiopulmonary disease. Electronically Signed   By: Alcide Clever M.D.   On: 09/02/2023 01:09    Microbiology: Recent Results  (from the past 240 hour(s))  Culture, blood (Routine x 2)     Status: None (Preliminary result)   Collection Time: 09/02/23 12:34 AM   Specimen: BLOOD RIGHT ARM  Result Value Ref Range Status   Specimen Description   Final    BLOOD RIGHT ARM Performed at [facility], [address], [place], [place] [address]    Special Requests   Final    BOTTLES DRAWN AEROBIC AND ANAEROBIC Blood Culture results may not be optimal due to an inadequate volume of blood received in culture bottles Performed at [facility], [address] [place], [place] [address]    Culture   Final    NO GROWTH < 12 HOURS Performed at [facility], [address], [place], [place] [address]    Report Status PENDING  Incomplete  Culture, blood (Routine x 2)     Status: None (Preliminary result)   Collection Time: 09/02/23  1:17 AM   Specimen: BLOOD RIGHT ARM  Result Value Ref Range Status   Specimen Description   Final    BLOOD RIGHT ARM Performed at [facility], [address], [place], [place] [address]    Special Requests   Final    BOTTLES DRAWN AEROBIC AND ANAEROBIC Blood Culture adequate volume Performed at [facility], [address], [place], [place] [address]    Culture   Final    NO GROWTH < 12 HOURS Performed at [facility], [address], [place], [place] [address]    Report Status PENDING  Incomplete     Labs: Basic Metabolic Panel: Recent Labs  Lab 09/02/23 0034  NA 134*  K 4.0  CL 98  CO2 27  GLUCOSE 99  BUN 10  CREATININE 0.85  CALCIUM 9.0   Liver Function Tests: Recent Labs  Lab 09/02/23 0034  AST 16  ALT 13  ALKPHOS 64  BILITOT 0.2  PROT 8.5*  ALBUMIN 3.0*   No results for input(s): "LIPASE", "AMYLASE" in the last 168 hours. No results for input(s): "AMMONIA" in the last 168 hours. CBC: Recent Labs  Lab 09/02/23 0034  WBC 10.6*  NEUTROABS 7.8*  HGB 9.7*  HCT 28.9*  MCV 71.9*  PLT 477*    Cardiac Enzymes: No results for input(s): "CKTOTAL", "CKMB", "CKMBINDEX", "TROPONINI" in the last 168 hours. BNP: BNP (last 3 results) No results for input(s): "BNP" in the last 8760 hours.  ProBNP (last 3 results) No results for input(s): "PROBNP" in the last 8760 hours.  CBG: No results for input(s): "GLUCAP" in the last 168 hours.  Time spent: < 30 minutes were spent in preparing this discharge including medication reconciliation, counseling, and coordination of care.  Signed:  Ahnya Akre Sharlette Dense, MD  [facility] 09/02/2023, 3:42 PM

## 2023-09-02 NOTE — H&P (Signed)
History and Physical  Tristan [name] ZOX:[id number] DOB: Jun 27, 1986 DOA: 09/02/2023  PCP: Pcp, No   Chief Complaint: Left leg pain and swelling  HPI: Tristan [name] is a 37 y.o. male with medical history significant for polysubstance abuse, bipolar 1 disorder, HIV on Biktarvy being admitted to the hospital with sepsis due to left lower extremity cellulitis.  Patient states he was in his usual state of health until about 48 hours ago, when he started noticing redness and swelling of the left lower extremity.  Some subjective fevers, denies any trauma, no cough, shortness of breath, nausea, vomiting, shortness of breath, chest pain or any other complaints.  He does have a small round wound on the anterior left shin which was draining some clear liquid.  Diagnosed with cellulitis in the emergency department, started on empiric IV Rocephin and hospitalist contacted for admission.  Review of Systems: Please see HPI for pertinent positives and negatives. A complete 10 system review of systems are otherwise negative.  Past Medical History:  Diagnosis Date   Bipolar 1 disorder (HCC)    Hepatitis C    Hypertension    PTSD (post-traumatic stress disorder)    Past Surgical History:  Procedure Laterality Date   I & D EXTREMITY Left 05/11/2022   Procedure: IRRIGATION AND DEBRIDEMENT FOREARM;  Surgeon: Marlyne Beards, MD;  Location: [facility] OR;  Service: Orthopedics;  Laterality: Left;   NO PAST SURGERIES      Social History:  reports that he has been smoking cigarettes. He has never used smokeless tobacco. He reports current alcohol use. He reports current drug use. Drugs: Marijuana, Heroin, Cocaine, Methamphetamines, and Morphine.   No Active Allergies  Family History  Problem Relation Age of Onset   Other Father    Psychiatric Illness Father      Prior to Admission medications   Medication Sig Start Date End Date Taking? Authorizing Provider   bictegravir-emtricitabine-tenofovir AF (BIKTARVY) 50-200-25 MG TABS tablet Take 1 tablet by mouth daily. Patient not taking: Reported on 09/02/2023 06/12/23   Laurence Spates, MD  folic acid (FOLVITE) 1 MG tablet Take 1 tablet (1 mg total) by mouth daily. Patient not taking: Reported on 02/27/2022 02/23/22   Joycelyn Das, MD  hydrocortisone cream 1 % Apply topically 2 (two) times daily as needed for itching. Patient not taking: Reported on 09/02/2023 06/11/23   Cristopher Peru, PA-C  naloxone [facility] 0.4 MG/ML injection Administer in nose as needed for opioid overdose. Patient not taking: Reported on 09/02/2023 06/11/23   Laurence Spates, MD    Physical Exam: BP (!) 120/92   Pulse 67   Temp 98.4 F (36.9 C) (Oral)   Resp 17   Ht 5\' 10"  (1.778 m) Comment: Simultaneous filing. User may not have seen previous data.  Wt 93.7 kg   SpO2 100%   BMI 29.64 kg/m   General:  Alert, oriented, calm, in no acute distress, getting left lower extremity Doppler, looks nontoxic Cardiovascular: RRR, no murmurs or rubs Respiratory: clear to auscultation bilaterally, no wheezes, no crackles  Abdomen: soft, nontender, nondistended, normal bowel tones heard  Skin: dry, no rashes  Musculoskeletal: Significant left lower warmth, edema, tenderness, no obvious areas of drainage, or fluctuance Psychiatric: appropriate affect, normal speech  Neurologic: extraocular muscles intact, clear speech, moving all extremities with intact sensorium         Labs on Admission:  Basic Metabolic Panel: Recent Labs  Lab 09/02/23 0034  NA 134*  K 4.0  CL 98  CO2 27  GLUCOSE 99  BUN 10  CREATININE 0.85  CALCIUM 9.0   Liver Function Tests: Recent Labs  Lab 09/02/23 0034  AST 16  ALT 13  ALKPHOS 64  BILITOT 0.2  PROT 8.5*  ALBUMIN 3.0*   No results for input(s): "LIPASE", "AMYLASE" in the last 168 hours. No results for input(s): "AMMONIA" in the last 168 hours. CBC: Recent Labs  Lab 09/02/23 0034   WBC 10.6*  NEUTROABS 7.8*  HGB 9.7*  HCT 28.9*  MCV 71.9*  PLT 477*   Cardiac Enzymes: No results for input(s): "CKTOTAL", "CKMB", "CKMBINDEX", "TROPONINI" in the last 168 hours.  BNP (last 3 results) No results for input(s): "BNP" in the last 8760 hours.  ProBNP (last 3 results) No results for input(s): "PROBNP" in the last 8760 hours.  CBG: No results for input(s): "GLUCAP" in the last 168 hours.  Radiological Exams on Admission: DG Chest 2 View  Result Date: 09/02/2023 CLINICAL DATA:  Bilateral leg swelling for 2 days, initial encounter EXAM: CHEST - 2 VIEW COMPARISON:  06/08/2023 FINDINGS: The heart size and mediastinal contours are within normal limits. Both lungs are clear. The visualized skeletal structures are unremarkable. IMPRESSION: No active cardiopulmonary disease. Electronically Signed   By: Alcide Clever M.D.   On: 09/02/2023 01:09    Assessment/Plan Tristan [name] is a 37 y.o. male with medical history significant for polysubstance abuse, bipolar 1 disorder, HIV on Biktarvy being admitted to the hospital with sepsis due to left lower extremity cellulitis.  Sepsis due to left lower extremity cellulitis, meeting criteria with tachycardia, leukocytosis, source is cellulitis.  Currently no evidence of abscess or subcutaneous infection.  Lactate normal, no evidence of endorgan dysfunction -Observation admission -Follow-up blood cultures -Continue empiric IV Rocephin -Consider further imaging in case of lack of improvement -Follow-up right lower extremity Doppler to rule out DVT  Leukocytosis-due to cellulitis as above  HIV-continue Biktarvy  Chronic anemia-appears to be at baseline  Thrombocytosis-likely reactive due to acute infection  DVT prophylaxis: Lovenox     Code Status: Full Code  Consults called: None  Admission status: Observation  Time spent: 46 minutes  Tristan [name] Sharlette Dense MD [facility] Pager [phone]  If 7PM-7AM,  please contact night-coverage [email or web address] Password [email or web address]  09/02/2023, 9:06 AM

## 2023-09-02 NOTE — Progress Notes (Signed)
Bilateral lower extremity venous duplex has been completed. Preliminary results can be found in CV Proc through chart review.   09/02/23 9:32 AM Olen Cordial RVT

## 2023-09-02 NOTE — ED Notes (Signed)
Sandwich and soda provided to pt.

## 2023-09-02 NOTE — ED Notes (Signed)
MD notified the pt suddenly called me into the room stating that he wants to leave AMA. He agreed to allow me to notify the MD and to come talk to him

## 2023-09-02 NOTE — ED Notes (Signed)
Bed placement notified the pt. Is leaving AMA

## 2023-09-03 LAB — BLOOD CULTURE ID PANEL (REFLEXED) - BCID2

## 2023-09-03 NOTE — ED Notes (Signed)
Attempted to Contact patient x 2 in regards to positive BC results. Spoke with patient's mother on first attempt. She states she will have him call back. 2nd attempt, patient's father answer when this RN called patient's primary contact number. Father reports patient does not have a phone.

## 2023-09-04 LAB — CULTURE, BLOOD (ROUTINE X 2): Culture  Setup Time: NO GROWTH

## 2023-09-05 ENCOUNTER — Telehealth (HOSPITAL_BASED_OUTPATIENT_CLINIC_OR_DEPARTMENT_OTHER): Payer: Self-pay | Admitting: *Deleted

## 2023-09-05 NOTE — Telephone Encounter (Signed)
Post ED Visit - Positive Culture Follow-up  Culture report reviewed by antimicrobial stewardship pharmacist: [facility] [x]  Enzo Bi, Pharm.D. []  Celedonio Miyamoto, Pharm.D., BCPS AQ-ID []  Garvin Fila, Pharm.D., BCPS []  Georgina Pillion, Pharm.D., BCPS []  [place], [place].D., BCPS, AAHIVP []  Estella Husk, Pharm.D., BCPS, AAHIVP []  Lysle Pearl, PharmD, BCPS []  Phillips Climes, PharmD, BCPS []  Agapito Games, PharmD, BCPS []  Verlan Friends, PharmD []  Mervyn Gay, PharmD, BCPS []  Vinnie Level, PharmD  [facility] [x]  Sharin Mons, PharmD []  Greer Pickerel, PharmD []  Adalberto Cole, PharmD []  Perlie Gold, Rph []  Lonell Face) Jean Rosenthal, PharmD []  Earl Many, PharmD []  Junita Push, PharmD []  Dorna Leitz, PharmD []  Terrilee Files, PharmD []  Lynann Beaver, PharmD []  Keturah Barre, PharmD []  Loralee Pacas, PharmD []  Bernadene Person, PharmD   Positive blood culture Treated with no antibiotic, likely contamination  Bing Quarry 09/05/2023, 9:32 AM

## 2023-09-07 LAB — CULTURE, BLOOD (ROUTINE X 2)
Culture: NO GROWTH
Special Requests: ADEQUATE

## 2023-09-11 ENCOUNTER — Emergency Department (HOSPITAL_COMMUNITY)
Admission: EM | Admit: 2023-09-11 | Discharge: 2023-09-11 | Discharge status: Against Medical Advice | Payer: Self-pay | Attending: Emergency Medicine | Admitting: Emergency Medicine

## 2023-09-11 ENCOUNTER — Encounter (HOSPITAL_COMMUNITY): Payer: Self-pay

## 2023-09-11 ENCOUNTER — Emergency Department (HOSPITAL_COMMUNITY): Admission: EM | Admit: 2023-09-11 | Discharge: 2023-09-11 | Payer: Self-pay

## 2023-09-11 ENCOUNTER — Emergency Department (HOSPITAL_COMMUNITY): Payer: Self-pay

## 2023-09-11 ENCOUNTER — Emergency Department (HOSPITAL_BASED_OUTPATIENT_CLINIC_OR_DEPARTMENT_OTHER)
Admit: 2023-09-11 | Discharge: 2023-09-11 | Disposition: A | Discharge status: Home / Self Care | Payer: Self-pay | Attending: Emergency Medicine | Admitting: Emergency Medicine

## 2023-09-11 ENCOUNTER — Other Ambulatory Visit: Payer: Self-pay

## 2023-09-11 DIAGNOSIS — R609 Edema, unspecified: Secondary | ICD-10-CM

## 2023-09-11 DIAGNOSIS — Z21 Asymptomatic human immunodeficiency virus [HIV] infection status: Secondary | ICD-10-CM | POA: Insufficient documentation

## 2023-09-11 DIAGNOSIS — R110 Nausea: Secondary | ICD-10-CM | POA: Insufficient documentation

## 2023-09-11 DIAGNOSIS — L03116 Cellulitis of left lower limb: Secondary | ICD-10-CM | POA: Insufficient documentation

## 2023-09-11 DIAGNOSIS — R197 Diarrhea, unspecified: Secondary | ICD-10-CM | POA: Insufficient documentation

## 2023-09-11 DIAGNOSIS — Z5321 Procedure and treatment not carried out due to patient leaving prior to being seen by health care provider: Secondary | ICD-10-CM | POA: Insufficient documentation

## 2023-09-11 DIAGNOSIS — R109 Unspecified abdominal pain: Secondary | ICD-10-CM | POA: Insufficient documentation

## 2023-09-11 HISTORY — DX: Asymptomatic human immunodeficiency virus (hiv) infection status: Z21

## 2023-09-11 LAB — CBC
HCT: 36.2 % — ABNORMAL LOW (ref 39.0–52.0)
HCT: 28.7 % — ABNORMAL LOW (ref 39.0–52.0)
Hemoglobin: 11.7 g/dL — ABNORMAL LOW (ref 13.0–17.0)
Hemoglobin: 9.5 g/dL — ABNORMAL LOW (ref 13.0–17.0)
MCH: 32.3 pg (ref 26.0–34.0)
MCH: 23.4 pg — ABNORMAL LOW (ref 26.0–34.0)
MCHC: 32.3 g/dL (ref 30.0–36.0)
MCHC: 33.1 g/dL (ref 30.0–36.0)
MCV: 100 fL (ref 80.0–100.0)
MCV: 70.7 fL — ABNORMAL LOW (ref 80.0–100.0)
Platelets: 299 10*3/uL (ref 150–400)
Platelets: 428 10*3/uL — ABNORMAL HIGH (ref 150–400)
RBC: 3.62 MIL/uL — ABNORMAL LOW (ref 4.22–5.81)
RBC: 4.06 MIL/uL — ABNORMAL LOW (ref 4.22–5.81)
RDW: 12.9 % (ref 11.5–15.5)
RDW: 16 % — ABNORMAL HIGH (ref 11.5–15.5)
WBC: 4 10*3/uL (ref 4.0–10.5)
WBC: 8.6 10*3/uL (ref 4.0–10.5)
nRBC: 0 % (ref 0.0–0.2)
nRBC: 0 % (ref 0.0–0.2)

## 2023-09-11 LAB — ETHANOL: Alcohol, Ethyl (B): <10 mg/dL (ref <10)

## 2023-09-11 LAB — COMPREHENSIVE METABOLIC PANEL
ALT: 16 U/L (ref 0–44)
AST: 24 U/L (ref 15–41)
Albumin: 4.2 g/dL (ref 3.5–5.0)
Alkaline Phosphatase: 52 U/L (ref 38–126)
Anion gap: 11 (ref 5–15)
BUN: 16 mg/dL (ref 6–20)
CO2: 22 mmol/L (ref 22–32)
Calcium: 8.7 mg/dL — ABNORMAL LOW (ref 8.9–10.3)
Chloride: 103 mmol/L (ref 98–111)
Creatinine, Ser: 1.28 mg/dL — ABNORMAL HIGH (ref 0.61–1.24)
GFR, Estimated: >60 mL/min (ref >60)
Glucose, Bld: 263 mg/dL — ABNORMAL HIGH (ref 70–99)
Potassium: 4.3 mmol/L (ref 3.5–5.1)
Sodium: 136 mmol/L (ref 135–145)
Total Bilirubin: 0.3 mg/dL (ref <1.2)
Total Protein: 7.3 g/dL (ref 6.5–8.1)

## 2023-09-11 LAB — CBC WITH DIFFERENTIAL/PLATELET
Abs Immature Granulocytes: 0.04 10*3/uL (ref 0.00–0.07)
Basophils Absolute: 0 10*3/uL (ref 0.0–0.1)
Basophils Relative: 0 %
Eosinophils Absolute: 0.1 10*3/uL (ref 0.0–0.5)
Eosinophils Relative: 1 %
HCT: 22.9 % — ABNORMAL LOW (ref 39.0–52.0)
Hemoglobin: 7.8 g/dL — ABNORMAL LOW (ref 13.0–17.0)
Immature Granulocytes: 0 %
Lymphocytes Relative: 18 %
Lymphs Abs: 2 10*3/uL (ref 0.7–4.0)
MCH: 24.2 pg — ABNORMAL LOW (ref 26.0–34.0)
MCHC: 34.1 g/dL (ref 30.0–36.0)
MCV: 71.1 fL — ABNORMAL LOW (ref 80.0–100.0)
Monocytes Absolute: 0.6 10*3/uL (ref 0.1–1.0)
Monocytes Relative: 5 %
Neutro Abs: 8.5 10*3/uL — ABNORMAL HIGH (ref 1.7–7.7)
Neutrophils Relative %: 76 %
Platelets: 502 10*3/uL — ABNORMAL HIGH (ref 150–400)
RBC: 3.22 MIL/uL — ABNORMAL LOW (ref 4.22–5.81)
RDW: 16 % — ABNORMAL HIGH (ref 11.5–15.5)
WBC: 11.3 10*3/uL — ABNORMAL HIGH (ref 4.0–10.5)
nRBC: 0 % (ref 0.0–0.2)

## 2023-09-11 LAB — BASIC METABOLIC PANEL
Anion gap: 10 (ref 5–15)
BUN: 10 mg/dL (ref 6–20)
CO2: 21 mmol/L — ABNORMAL LOW (ref 22–32)
Calcium: 8.9 mg/dL (ref 8.9–10.3)
Chloride: 103 mmol/L (ref 98–111)
Creatinine, Ser: 0.73 mg/dL (ref 0.61–1.24)
GFR, Estimated: >60 mL/min (ref >60)
Glucose, Bld: 104 mg/dL — ABNORMAL HIGH (ref 70–99)
Potassium: 3.5 mmol/L (ref 3.5–5.1)
Sodium: 134 mmol/L — ABNORMAL LOW (ref 135–145)

## 2023-09-11 LAB — CBG MONITORING, ED: Glucose-Capillary: 250 mg/dL — ABNORMAL HIGH (ref 70–99)

## 2023-09-11 LAB — CG4 I-STAT (LACTIC ACID): Lactic Acid, Venous: 0.9 mmol/L (ref 0.5–1.9)

## 2023-09-11 LAB — ACETAMINOPHEN LEVEL: Acetaminophen (Tylenol), Serum: <10 ug/mL — ABNORMAL LOW (ref 10–30)

## 2023-09-11 LAB — SALICYLATE LEVEL: Salicylate Lvl: <7 mg/dL — ABNORMAL LOW (ref 7.0–30.0)

## 2023-09-11 MED ORDER — BICTEGRAVIR-EMTRICITAB-TENOFOV 50-200-25 MG PO TABS: 1.0000 | ORAL_TABLET | Freq: Every day | ORAL | 0 refills | Status: DC

## 2023-09-11 MED ORDER — SULFAMETHOXAZOLE-TRIMETHOPRIM 800-160 MG PO TABS: 1.0000 | ORAL_TABLET | Freq: Two times a day (BID) | ORAL | 0 refills | Status: AC

## 2023-09-11 MED ORDER — CEFTRIAXONE SODIUM 1 G IJ SOLR
1.0000 g | Freq: Once | INTRAMUSCULAR | Status: AC
  Administered 2023-09-11: 1 g via INTRAVENOUS
  Filled 2023-09-11: qty 10

## 2023-09-11 MED ORDER — CEPHALEXIN 500 MG PO CAPS: 500.0000 mg | ORAL_CAPSULE | Freq: Four times a day (QID) | ORAL | 0 refills | Status: DC

## 2023-09-11 MED ORDER — CEPHALEXIN 500 MG PO CAPS
500.0000 mg | ORAL_CAPSULE | Freq: Four times a day (QID) | ORAL | 0 refills | Status: DC
Start: 2023-09-11 — End: 2023-11-20

## 2023-09-11 MED ORDER — SULFAMETHOXAZOLE-TRIMETHOPRIM 800-160 MG PO TABS: 1.0000 | ORAL_TABLET | Freq: Two times a day (BID) | ORAL | 0 refills | Status: DC

## 2023-09-11 MED ORDER — KETOROLAC TROMETHAMINE 60 MG/2ML IM SOLN
15.0000 mg | Freq: Once | INTRAMUSCULAR | Status: AC
  Administered 2023-09-11: 15 mg via INTRAMUSCULAR
  Filled 2023-09-11: qty 2

## 2023-09-11 NOTE — ED Provider Notes (Signed)
[place] EMERGENCY DEPARTMENT AT [facility] Provider Note   CSN: [id number] Arrival date & time: 09/11/23  1540     History  Chief Complaint  Patient presents with   Addiction Problem    Tristan [name] is a 37 y.o. male.  Present[age]-year-old male with past medical history of HIV who has not taken his medication now in the past week or so as well as bipolar disorder presenting to the emergency department today with pain in his left lower extremity.  Patient was admitted on December 10 for cellulitis.  He apparently left AGAINST MEDICAL ADVICE.  He is not on any antibiotics since.  He states that he has been having some subjective fevers and chills since then.  He was brought in today by police.  He was apparently obtained for an unrelated charge.  When he was picked up he stated that he was having pain in his left leg as well as I was concerned that he "drank heroin".  He states that he drank a drink that a friend gave him and thinks that there was some heroin in it.  He denies any intentional overdose or intentional use of narcotics.  He was brought to the ER at that time for further evaluation.  He denies any chest pain or shortness of breath.        Home Medications Prior to Admission medications   Medication Sig Start Date End Date Taking? Authorizing Provider  cephALEXin (KEFLEX) 500 MG capsule Take 1 capsule (500 mg total) by mouth 4 (four) times daily. 09/11/23  Yes Durwin Glaze, MD  sulfamethoxazole-trimethoprim (BACTRIM DS) 800-160 MG tablet Take 1 tablet by mouth 2 (two) times daily for 7 days. 09/11/23 09/18/23 Yes Durwin Glaze, MD  bictegravir-emtricitabine-tenofovir AF (BIKTARVY) 50-200-25 MG TABS tablet Take 1 tablet by mouth daily. 09/11/23   Durwin Glaze, MD  folic acid (FOLVITE) 1 MG tablet Take 1 tablet (1 mg total) by mouth daily. Patient not taking: Reported on 02/27/2022 02/23/22   Joycelyn Das, MD  hydrocortisone cream 1 % Apply  topically 2 (two) times daily as needed for itching. Patient not taking: Reported on 09/02/2023 06/11/23   Cristopher Peru, PA-C  naloxone [facility]) 0.4 MG/ML injection Administer in nose as needed for opioid overdose. Patient not taking: Reported on 09/02/2023 06/11/23   Laurence Spates, MD      Allergies    Patient has no active allergies.    Review of Systems   Review of Systems  Skin:  Positive for color change and wound.       Left lower extremity redness  All other systems reviewed and are negative.   Physical Exam Updated Vital Signs BP 137/70 (BP Location: Right Arm)   Pulse 77   Temp 99.9 F (37.7 C) (Oral)   Resp 16   Ht 5\' 10"  (1.778 m)   Wt 93 kg   SpO2 99%   BMI 29.42 kg/m  Physical Exam Vitals and nursing note reviewed.   Gen: NAD Eyes: PERRL, EOMI HEENT: no oropharyngeal swelling Neck: trachea midline Resp: clear to auscultation bilaterally Card: RRR, no murmurs, rubs, or gallops Abd: nontender, nondistended Extremities: 1+ pitting edema noted over the left lower extremity, there are some superficial ulcerations as well as some mild erythema noted to the left lower extremity.  No crepitus. Vascular: 2+ radial pulses bilaterally, 2+ DP pulses bilaterally Skin: no rashes Psyc: acting appropriately   ED Results / Procedures / Treatments  Labs (all labs ordered are listed, but only abnormal results are displayed) Labs Reviewed  CBC WITH DIFFERENTIAL/PLATELET - Abnormal; Notable for the following components:      Result Value   WBC 11.3 (*)    RBC 3.22 (*)    Hemoglobin 7.8 (*)    HCT 22.9 (*)    MCV 71.1 (*)    MCH 24.2 (*)    RDW 16.0 (*)    Platelets 502 (*)    Neutro Abs 8.5 (*)    All other components within normal limits  BASIC METABOLIC PANEL - Abnormal; Notable for the following components:   Sodium 134 (*)    CO2 21 (*)    Glucose, Bld 104 (*)    All other components within normal limits  SALICYLATE LEVEL - Abnormal; Notable for the  following components:   Salicylate Lvl <7.0 (*)    All other components within normal limits  ACETAMINOPHEN LEVEL - Abnormal; Notable for the following components:   Acetaminophen (Tylenol), Serum <10 (*)    All other components within normal limits  CBC - Abnormal; Notable for the following components:   RBC 4.06 (*)    Hemoglobin 9.5 (*)    HCT 28.7 (*)    MCV 70.7 (*)    MCH 23.4 (*)    RDW 16.0 (*)    Platelets 428 (*)    All other components within normal limits  CULTURE, BLOOD (ROUTINE X 2)  CULTURE, BLOOD (ROUTINE X 2)  ETHANOL  RAPID URINE DRUG SCREEN, HOSP PERFORMED  I-STAT CG4 LACTIC ACID, ED  I-STAT CG4 LACTIC ACID, ED    EKG None  Radiology DG Tibia/Fibula Left Result Date: 09/11/2023 CLINICAL DATA:  Cellulitis and pain. EXAM: LEFT TIBIA AND FIBULA - 2 VIEW COMPARISON:  None Available. FINDINGS: There is no evidence of fracture or other focal bone lesions. No evidence of osteomyelitis. Diffuse soft tissue swelling. IMPRESSION: Diffuse soft tissue swelling. No acute osseous abnormality. Electronically Signed   By: Minerva Fester M.D.   On: 09/11/2023 19:18   VAS [place] LOWER EXTREMITY VENOUS (DVT) (ONLY [facility] & [facility]) Result Date: 09/11/2023  Lower Venous DVT Study Patient Name:  Tristan [name]  Date of Exam:   09/11/2023 Medical Rec #: [id number]                  Accession #:    [id number] Date of Birth: [birth year]                  Patient Gender: M Patient Age:   15 years Exam Location:  [facility] Procedure:      VAS [place] LOWER EXTREMITY VENOUS (DVT) Referring Phys: BRITNI HENDERLY --------------------------------------------------------------------------------  Indications: Edema.  Risk Factors: None identified. Limitations: Poor ultrasound/tissue interface. Comparison Study: 09/02/2023 -                   - There is no evidence of deep vein thrombosis in the lower                   extremity.                    - No cystic structure found in the popliteal  fossa.                    LEFT:                    - There is  no evidence of deep vein thrombosis in the lower                   extremity.                   However, portions of this examination were limited- see                   technologist                   comments above.                    - No cystic structure found in the popliteal fossa. Performing Technologist: Chanda Busing RVT  Examination Guidelines: A complete evaluation includes B-mode imaging, spectral Doppler, color Doppler, and power Doppler as needed of all accessible portions of each vessel. Bilateral testing is considered an integral part of a complete examination. Limited examinations for reoccurring indications may be performed as noted. The reflux portion of the exam is performed with the patient in reverse Trendelenburg.  +-----+---------------+---------+-----------+----------+--------------+ RIGHTCompressibilityPhasicitySpontaneityPropertiesThrombus Aging +-----+---------------+---------+-----------+----------+--------------+ CFV  Full           Yes      Yes                                 +-----+---------------+---------+-----------+----------+--------------+   +---------+---------------+---------+-----------+----------+--------------+ LEFT     CompressibilityPhasicitySpontaneityPropertiesThrombus Aging +---------+---------------+---------+-----------+----------+--------------+ CFV      Full           Yes      Yes                                 +---------+---------------+---------+-----------+----------+--------------+ SFJ      Full                                                        +---------+---------------+---------+-----------+----------+--------------+ FV Prox  Full                                                        +---------+---------------+---------+-----------+----------+--------------+ FV Mid   Full                                                         +---------+---------------+---------+-----------+----------+--------------+ FV DistalFull                                                        +---------+---------------+---------+-----------+----------+--------------+ PFV      Full                                                        +---------+---------------+---------+-----------+----------+--------------+  POP      Full           Yes      Yes                                 +---------+---------------+---------+-----------+----------+--------------+ PTV      Full                                                        +---------+---------------+---------+-----------+----------+--------------+ PERO     Full                                                        +---------+---------------+---------+-----------+----------+--------------+     Summary: RIGHT: - No evidence of common femoral vein obstruction.   LEFT: - There is no evidence of deep vein thrombosis in the lower extremity.  - No cystic structure found in the popliteal fossa.  *See table(s) above for measurements and observations. Electronically signed by Carolynn Sayers on 09/11/2023 at 5:11:40 PM.    Final     Procedures Procedures    Medications Ordered in ED Medications  cefTRIAXone (ROCEPHIN) 1 g in sodium chloride 0.9 % 100 mL IVPB (0 g Intravenous Stopped 09/11/23 2006)  ketorolac (TORADOL) injection 15 mg (15 mg Intramuscular Given 09/11/23 2054)    ED Course/ Medical Decision Making/ A&P                                 Medical Decision Making 37 year old male with past medical history of HIV and bipolar disorder presents the emergency department today with concerns for cellulitis of the left lower extremity.  The patient requested to be evaluated for this after being incarcerated here.  He does have a workup initiated from triage in addition to labs he also has an ultrasound to evaluate for DVT.  I give the patient Rocephin for cellulitis.  The  patient is hemodynamically stable here with reassuring vital signs.  I think that if his initial workup is reassuring and that he may be discharged with oral antibiotics and please custody.  In regards to the accidental heroin ingestion the patient is alert and oriented.  He is not sedated at all.  We will observe the patient here for any signs of sedation but again, I think that with his reassuring exam if his workup is unremarkable that he may potentially be discharged.  Amount and/or Complexity of Data Reviewed Labs: ordered.  Risk Prescription drug management.           Final Clinical Impression(s) / ED Diagnoses Final diagnoses:  Cellulitis of left lower extremity    Rx / DC Orders ED Discharge Orders          Ordered    cephALEXin (KEFLEX) 500 MG capsule  4 times daily        09/11/23 2215    sulfamethoxazole-trimethoprim (BACTRIM DS) 800-160 MG tablet  2 times daily        09/11/23 2215    bictegravir-emtricitabine-tenofovir AF (BIKTARVY) 50-200-25 MG TABS tablet  Daily        09/11/23 2215              Durwin Glaze, MD 09/11/23 2216

## 2023-09-11 NOTE — Discharge Instructions (Addendum)
Your workup today was reassuring.  Please take the antibiotics as prescribed and follow-up with your doctor.  I did send a refill of your Biktarvy as well.  Please restart this.  Return to the ER for worsening symptoms.

## 2023-09-11 NOTE — ED Triage Notes (Signed)
Patient BIB [facility] after stating that he ate heroin and then injected heroin. Patient is under police custody. Patient said his legs hurt. Left leg more swollen than the right. Stated he has cellulitis.

## 2023-09-11 NOTE — ED Provider Triage Note (Signed)
Emergency Medicine Provider Triage Evaluation Note  Tristan [name] , a 37 y.o. male  was evaluated in triage.  Pt complains of lle edema. Admitted last week for cellulitis. Left AMA. Has not taken abx. States he feels like he has had a fever. Here under policy custody. Arrested for alleged larceny. States he thinks someone spiked his drink with "heroin". Hx of HIV non compliant with meds. Denies recent trauma.  Review of Systems  Positive: LLE edema Negative:   Physical Exam  There were no vitals taken for this visit. Gen:   Awake, no distress   Resp:  Normal effort  MSK:   Moves extremities without difficulty. Significant edema, erythema to LLE Other:    Medical Decision Making  Medically screening exam initiated at 3:43 PM.  Appropriate orders placed.  Jerelyn Charles Obenchain was informed that the remainder of the evaluation will be completed by another provider, this initial triage assessment does not replace that evaluation, and the importance of remaining in the ED until their evaluation is complete.  LLE edema, substance use   Jenay Morici A, PA-C 09/11/23 1550

## 2023-09-11 NOTE — ED Notes (Signed)
3 IV attempts unsuccessful, IV team consulted, provider made aware, pt provided sandwich upon request, pt currently lying in bed with eyes closed and RR E/U

## 2023-09-11 NOTE — ED Triage Notes (Signed)
Patient BIB EMS from home c/o abdominal pain x1 day. Patient report nausea and diarrhea this morning. Patient report since his medication was changed to metfomin he feel sick and nauseous.

## 2023-09-11 NOTE — Progress Notes (Signed)
Left lower extremity venous duplex has been completed. Preliminary results can be found in CV Proc through chart review.  Results were given to [facility] PA.  09/11/23 4:16 PM Olen Cordial RVT

## 2023-09-16 LAB — CULTURE, BLOOD (ROUTINE X 2)
Culture: NO GROWTH
Special Requests: ADEQUATE

## 2023-10-21 ENCOUNTER — Telehealth: Payer: Self-pay

## 2023-10-21 DIAGNOSIS — B20 Human immunodeficiency virus [HIV] disease: Secondary | ICD-10-CM

## 2023-10-21 MED ORDER — BICTEGRAVIR-EMTRICITAB-TENOFOV 50-200-25 MG PO TABS: 1.0000 | ORAL_TABLET | Freq: Every day | ORAL | 0 refills | Status: DC

## 2023-10-21 NOTE — Telephone Encounter (Signed)
Received call from [place] with [facility] requesting refills of Tristan [name] be sent to [facility] in [place]. Patient has not been seen by [facility] yet, appointment is scheduled for 2/27. Appears Biktarvy was prescribed by ED physician on 12/19.  Melton Alar requesting call back at [phone]  Sandie Ano, RN

## 2023-10-21 NOTE — Telephone Encounter (Signed)
Refill sent. Notified Melton Alar [facility] the detention center.   Sandie Ano, RN

## 2023-10-21 NOTE — Addendum Note (Signed)
Addended by: Linna Hoff D on: 10/21/2023 01:27 PM   Modules accepted: Orders

## 2023-10-27 ENCOUNTER — Telehealth: Payer: Self-pay

## 2023-10-27 ENCOUNTER — Other Ambulatory Visit (HOSPITAL_COMMUNITY): Payer: Self-pay

## 2023-10-27 NOTE — Telephone Encounter (Addendum)
 [facility] Patient Advocate Encounter  Insurance verification completed.    The patient is uninsured and will need patient assistance for medication.  We can complete the application and will need to meet with the patient for signatures and income documentation.   Patient is enrolled in [employer] Patient Assistance Program. Patients assistance is active from 06/10/23 to 06/09/24. Per note of Carolyne Fiscal 06/10/23

## 2023-11-11 ENCOUNTER — Other Ambulatory Visit (HOSPITAL_COMMUNITY): Payer: Self-pay

## 2023-11-19 ENCOUNTER — Other Ambulatory Visit (HOSPITAL_COMMUNITY): Payer: Self-pay

## 2023-11-20 ENCOUNTER — Ambulatory Visit (INDEPENDENT_AMBULATORY_CARE_PROVIDER_SITE_OTHER): Admitting: Family

## 2023-11-20 ENCOUNTER — Other Ambulatory Visit: Payer: Self-pay

## 2023-11-20 ENCOUNTER — Encounter: Payer: Self-pay | Admitting: Family

## 2023-11-20 VITALS — BP 115/77 | HR 65 | Temp 98.2°F

## 2023-11-20 DIAGNOSIS — Z0000 Encounter for general adult medical examination without abnormal findings: Secondary | ICD-10-CM | POA: Insufficient documentation

## 2023-11-20 DIAGNOSIS — Z21 Asymptomatic human immunodeficiency virus [HIV] infection status: Secondary | ICD-10-CM

## 2023-11-20 DIAGNOSIS — B182 Chronic viral hepatitis C: Secondary | ICD-10-CM | POA: Diagnosis not present

## 2023-11-20 DIAGNOSIS — F3160 Bipolar disorder, current episode mixed, unspecified: Secondary | ICD-10-CM

## 2023-11-20 DIAGNOSIS — F1190 Opioid use, unspecified, uncomplicated: Secondary | ICD-10-CM | POA: Diagnosis not present

## 2023-11-20 DIAGNOSIS — Z113 Encounter for screening for infections with a predominantly sexual mode of transmission: Secondary | ICD-10-CM

## 2023-11-20 DIAGNOSIS — Z23 Encounter for immunization: Secondary | ICD-10-CM

## 2023-11-20 DIAGNOSIS — Z79899 Other long term (current) drug therapy: Secondary | ICD-10-CM

## 2023-11-20 NOTE — Patient Instructions (Addendum)
 Nice to see you.  Continue to take your medication daily as prescribed.   Let us know when you are released if before your next visit.   We will check your lab work today.  Have a great day and stay safe!

## 2023-11-20 NOTE — Assessment & Plan Note (Signed)
 Currently stable with buprenorphine.  No signs of cravings/withdrawals.  May need counseling upon release.  Continue current dose of buprenorphine.

## 2023-11-20 NOTE — Assessment & Plan Note (Signed)
 Previously positive Hepatitis C antibody with negative hepatitis C RNA level indicating likely clearance of hepatitis C independently.  Recheck hepatitis C RNA level.

## 2023-11-20 NOTE — Progress Notes (Signed)
 Brief Narrative   Patient ID: Tristan [name], male    DOB: April 09, 1986, 38 y.o.   MRN: [id number]  Tristan [name] is a 38 y/o AA male diagnosed with HIV-1 disease in April 2024 with risk factor of heterosexual contact and IV drug use. Initial viral load was 1,390 with CD4 count 365. Genotype with no significant medication resistant mutations. Co-infection with Hepatitis C with no RNA level on 06/10/23. No history of opportunistic infection. ART experienced with Biktarvy.   Subjective:    Chief Complaint  Patient presents with   HIV Positive/AIDS    HPI:  Tristan [name] is a 38 y.o. male with previous medical history of hypertension, Hepatitis C, Bipolar disorder, and HIV disease presents today to establish care.   Tristan [name] is currently incarcerated and presents today with [employer]. Initially seen by Dr. Daiva Eves during hospitalization in September 2024 where he was newly diagnosed and found to have initial viral load of 1,390 with CD4 count 365. Genotype with no significant medication resistant mutations. Risk factor being heterosexual contact and IV drug use. Was homeless at the time. Started on [place].  Now presenting to establish care. Has been incarcerated and receiving Biktarvy with no adverse side effects. Prior to incarceration did not have insurance and remained homless as he did not want to bring his drug use/HIV into his home. Has been out of work. On buprenorphine for opioid use disorder. On Abilify and Prozac for Bipolar disorder. Duration of incarceration is to be determined and will be staying in the [place] area after release. Healthcare maintenance reviewed. STD testing offered. D  Denies fevers, chills, night sweats, headaches, changes in vision, neck pain/stiffness, nausea, diarrhea, vomiting, lesions or rashes.    No Known Allergies    Outpatient Medications Prior to Visit  Medication Sig Dispense Refill    buprenorphine-naloxone (SUBOXONE) 8-2 mg SUBL SL tablet Place 3 tablets under the tongue daily.     bictegravir-emtricitabine-tenofovir AF (BIKTARVY) 50-200-25 MG TABS tablet Take 1 tablet by mouth daily. 30 tablet 0   folic acid (FOLVITE) 1 MG tablet Take 1 tablet (1 mg total) by mouth daily. (Patient not taking: Reported on 02/27/2022)     cephALEXin (KEFLEX) 500 MG capsule Take 1 capsule (500 mg total) by mouth 4 (four) times daily. 28 capsule 0   hydrocortisone cream 1 % Apply topically 2 (two) times daily as needed for itching. (Patient not taking: Reported on 09/02/2023) 30 g 0   naloxone (NARCAN) 0.4 MG/ML injection Administer in nose as needed for opioid overdose. (Patient not taking: Reported on 09/02/2023) 1 mL 0   No facility-administered medications prior to visit.     Past Medical History:  Diagnosis Date   Bipolar 1 disorder (HCC)    Hepatitis C    HIV (human immunodeficiency virus infection) (HCC)    Hypertension    PTSD (post-traumatic stress disorder)      Past Surgical History:  Procedure Laterality Date   I & D EXTREMITY Left 05/11/2022   Procedure: IRRIGATION AND DEBRIDEMENT FOREARM;  Surgeon: Marlyne Beards, MD;  Location: [facility];  Service: Orthopedics;  Laterality: Left;   NO PAST SURGERIES        Review of Systems  Constitutional:  Negative for appetite change, chills, fatigue, fever and unexpected weight change.  Eyes:  Negative for visual disturbance.  Respiratory:  Negative for cough, chest tightness, shortness of breath and wheezing.   Cardiovascular:  Negative for chest pain and leg swelling.  Gastrointestinal:  Negative for abdominal pain, constipation, diarrhea, nausea and vomiting.  Genitourinary:  Negative for dysuria, flank pain, frequency, genital sores, hematuria and urgency.  Skin:  Negative for rash.  Allergic/Immunologic: Negative for immunocompromised state.  Neurological:  Negative for dizziness and headaches.      Objective:    BP  115/77   Pulse 65   Temp 98.2 F (36.8 C) (Oral)   SpO2 99%  Nursing note and vital signs reviewed.  Physical Exam Constitutional:      General: He is not in acute distress.    Appearance: He is well-developed.  Eyes:     Conjunctiva/sclera: Conjunctivae normal.  Cardiovascular:     Rate and Rhythm: Normal rate and regular rhythm.     Heart sounds: Normal heart sounds. No murmur heard.    No friction rub. No gallop.  Pulmonary:     Effort: Pulmonary effort is normal. No respiratory distress.     Breath sounds: Normal breath sounds. No wheezing or rales.  Chest:     Chest wall: No tenderness.  Abdominal:     General: Bowel sounds are normal.     Palpations: Abdomen is soft.     Tenderness: There is no abdominal tenderness.  Musculoskeletal:     Cervical back: Neck supple.  Lymphadenopathy:     Cervical: No cervical adenopathy.  Skin:    General: Skin is warm and dry.     Findings: No rash.  Neurological:     Mental Status: He is alert and oriented to person, place, and time.  Psychiatric:        Behavior: Behavior normal.        Thought Content: Thought content normal.        Judgment: Judgment normal.         02/11/2022    5:08 AM 01/09/2022    8:53 AM 01/08/2022   11:57 AM 11/21/2021    4:22 PM  Depression screen PHQ 2/9  Decreased Interest      Down, Depressed, Hopeless      PHQ - 2 Score      Altered sleeping      Tired, decreased energy      Change in appetite      Feeling bad or failure about yourself       Trouble concentrating      Moving slowly or fidgety/restless      Suicidal thoughts      PHQ-9 Score      Difficult doing work/chores         Information is confidential and restricted. Go to Review Flowsheets to unlock data.       Assessment & Plan:    Patient Active Problem List   Diagnosis Date Noted   Opioid use disorder 11/20/2023   Healthcare maintenance 11/20/2023   Sepsis due to cellulitis (HCC) 09/02/2023   Asymptomatic HIV  infection, with no history of HIV-related illness (HCC) 06/11/2023   HIV test positive (HCC) 06/10/2023   IV drug abuse (HCC) 06/10/2023   Overdose 06/09/2023   Hypokalemia 06/09/2023   Chronic hepatitis C without hepatic coma (HCC) 06/09/2023   Acute alcohol intoxication delirium with moderate or severe use disorder (HCC) 06/09/2023   Toxic encephalopathy 06/08/2023   Surgical wound dehiscence, initial encounter 05/22/2022   Cellulitis 05/10/2022   Cellulitis of left forearm 05/10/2022   Acute encephalopathy 02/27/2022   Microcytic anemia 02/27/2022   Hypertension    Acute hepatitis C virus infection without hepatic coma 01/18/2022  Psychosis (HCC) 01/16/2022   Transaminitis 01/16/2022   Heroin use disorder, severe, dependence (HCC) 01/07/2022   Altered mental status 10/27/2021   Substance use disorder    Lactic acidosis    Acute respiratory failure with hypoxia and hypercapnia (HCC)    AKI (acute kidney injury) (HCC)    Polysubstance abuse (HCC) 01/29/2019   MDD (major depressive disorder), severe (HCC) 01/27/2019   Suicide attempt (HCC)    Bipolar 1 disorder, mixed (HCC) 05/02/2014     Problem List Items Addressed This Visit       Digestive   Chronic hepatitis C without hepatic coma (HCC) - Primary   Previously positive Hepatitis C antibody with negative hepatitis C RNA level indicating likely clearance of hepatitis C independently.  Recheck hepatitis C RNA level.      Relevant Orders   Hepatitis C RNA quantitative   Hepatitis C genotype   Liver Fibrosis, FibroTest-ActiTest     Other   Bipolar 1 disorder, mixed (HCC)   Appears stable with current dose of fluoxetine and aripiprazole.  No suicidal ideations or signs of psychosis.  Continue current dose of fluoxetine and aripiprazole.      Asymptomatic HIV infection, with no history of HIV-related illness [facility])   Mr. Lange appears to have well-controlled virus with good adherence and tolerance to Biktarvy.   Reviewed previous lab work and discussed plan of care and U equals U. Discussed the basics of HIV including transmission, risks if left untreated, treatment options, lab work, financial assistance, and plan of care. Introduced to Engineer, technical sales, counseling, Artist, and Engineer, materials.  Check lab work. Continue current dose of Biktarvy. Will likely need Medicaid upon release and may need housing assistance. Plan for follow up in about 2 months or sooner if needed.       Relevant Orders   HIV Antibody (routine testing w rflx)   QuantiFERON-TB Gold Plus   COMPLETE METABOLIC PANEL WITH GFR   CBC   HIV RNA, RTPCR W/R GT (RTI, PI,INT)   T-helper cell (CD4)- ([facility] clinic only)   Hepatitis B surface antigen   Hepatitis B surface antibody,quantitative   Hepatitis B core antibody, total   Toxoplasma gondii antibody, IgG   Varicella zoster antibody, IgG   Measles/Mumps/Rubella Immunity   Opioid use disorder   Currently stable with buprenorphine.  No signs of cravings/withdrawals.  May need counseling upon release.  Continue current dose of buprenorphine.      Healthcare maintenance   Site-specific STD testing offered. Vaccinations reviewed with Prevnar 20 and influenza updated. Due for routine dental care and can schedule upon release.      Other Visit Diagnoses       Screening for STDs (sexually transmitted diseases)       Relevant Orders   RPR     Pharmacologic therapy       Relevant Orders   Lipid panel     Need for pneumococcal 20-valent conjugate vaccination       Relevant Orders   Pneumococcal conjugate vaccine 20-valent (Completed)     Encounter for immunization       Relevant Orders   Flu vaccine trivalent PF, 6mos and older(Flulaval,Afluria,Fluarix,Fluzone) (Completed)        I have discontinued Tristan [name]'s hydrocortisone cream, naloxone, and cephALEXin. I am also having him maintain his folic acid,  bictegravir-emtricitabine-tenofovir AF, and buprenorphine-naloxone.    Follow-up: Return in about 2 months (around 01/18/2024). or sooner if needed.    Tammy Sours  Carver Fila, MSN, FNP-C Nurse Practitioner [facility] Main number: [phone]

## 2023-11-20 NOTE — Assessment & Plan Note (Signed)
 Site-specific STD testing offered. Vaccinations reviewed with Prevnar 20 and influenza updated. Due for routine dental care and can schedule upon release.

## 2023-11-20 NOTE — Assessment & Plan Note (Signed)
 Appears stable with current dose of fluoxetine and aripiprazole.  No suicidal ideations or signs of psychosis.  Continue current dose of fluoxetine and aripiprazole.

## 2023-11-20 NOTE — Assessment & Plan Note (Signed)
 Mr. Pine appears to have well-controlled virus with good adherence and tolerance to Biktarvy.  Reviewed previous lab work and discussed plan of care and U equals U. Discussed the basics of HIV including transmission, risks if left untreated, treatment options, lab work, financial assistance, and plan of care. Introduced to Engineer, technical sales, counseling, Artist, and Engineer, materials.  Check lab work. Continue current dose of Biktarvy. Will likely need Medicaid upon release and may need housing assistance. Plan for follow up in about 2 months or sooner if needed.

## 2023-11-21 LAB — T-HELPER CELL (CD4) - (RCID CLINIC ONLY)
CD4 % Helper T Cell: 30 % — ABNORMAL LOW (ref 33–65)
CD4 T Cell Abs: 578 /uL (ref 400–1790)

## 2023-11-26 LAB — LIVER FIBROSIS, FIBROTEST-ACTITEST
ALT: 9 U/L (ref 9–46)
Alpha-2-Macroglobulin: 207 mg/dL (ref 106–279)
Apolipoprotein A1: 143 mg/dL (ref 94–176)
Bilirubin: 0.2 mg/dL (ref 0.2–1.2)
Fibrosis Score: 0.08
GGT: 17 U/L (ref 3–90)
Haptoglobin: 142 mg/dL (ref 43–212)
Necroinflammat ACT Score: 0.02
Reference ID: 5363658

## 2023-11-26 LAB — COMPLETE METABOLIC PANEL WITH GFR
AG Ratio: 1.3 (calc) (ref 1.0–2.5)
ALT: 10 U/L (ref 9–46)
AST: 13 U/L (ref 10–40)
Albumin: 4.4 g/dL (ref 3.6–5.1)
Alkaline phosphatase (APISO): 64 U/L (ref 36–130)
BUN: 11 mg/dL (ref 7–25)
CO2: 21 mmol/L (ref 20–32)
Calcium: 9.4 mg/dL (ref 8.6–10.3)
Chloride: 103 mmol/L (ref 98–110)
Creat: 0.8 mg/dL (ref 0.60–1.26)
Globulin: 3.3 g/dL (ref 1.9–3.7)
Glucose, Bld: 92 mg/dL (ref 65–99)
Potassium: 4.1 mmol/L (ref 3.5–5.3)
Sodium: 138 mmol/L (ref 135–146)
Total Bilirubin: 0.2 mg/dL (ref 0.2–1.2)
Total Protein: 7.7 g/dL (ref 6.1–8.1)
eGFR: 116 mL/min/{1.73_m2} (ref >=60)

## 2023-11-26 LAB — CBC
HCT: 35.7 % — ABNORMAL LOW (ref 38.5–50.0)
Hemoglobin: 11.3 g/dL — ABNORMAL LOW (ref 13.2–17.1)
MCH: 24.2 pg — ABNORMAL LOW (ref 27.0–33.0)
MCHC: 31.7 g/dL — ABNORMAL LOW (ref 32.0–36.0)
MCV: 76.4 fL — ABNORMAL LOW (ref 80.0–100.0)
MPV: 9.4 fL (ref 7.5–12.5)
Platelets: 291 10*3/uL (ref 140–400)
RBC: 4.67 10*6/uL (ref 4.20–5.80)
RDW: 19.8 % — ABNORMAL HIGH (ref 11.0–15.0)
WBC: 4.7 10*3/uL (ref 3.8–10.8)

## 2023-11-26 LAB — HIV RNA, RTPCR W/R GT (RTI, PI,INT)
HIV 1 RNA Quant: 34 {copies}/mL — ABNORMAL HIGH
HIV-1 RNA Quant, Log: 1.53 {Log_copies}/mL — ABNORMAL HIGH

## 2023-11-26 LAB — LIPID PANEL
Cholesterol: 152 mg/dL (ref <200)
HDL: 52 mg/dL (ref >=40)
LDL Cholesterol (Calc): 81 mg/dL
Non-HDL Cholesterol (Calc): 100 mg/dL (ref <130)
Total CHOL/HDL Ratio: 2.9 (calc) (ref <5.0)
Triglycerides: 101 mg/dL (ref <150)

## 2023-11-26 LAB — TOXOPLASMA GONDII ANTIBODY, IGG: Toxoplasma IgG Ratio: <7.2 [IU]/mL

## 2023-11-26 LAB — QUANTIFERON-TB GOLD PLUS
Mitogen-NIL: 7.6 [IU]/mL
NIL: 0.03 [IU]/mL
QuantiFERON-TB Gold Plus: NEGATIVE
TB1-NIL: <0 [IU]/mL
TB2-NIL: <0 [IU]/mL

## 2023-11-26 LAB — HIV-1/2 AB - DIFFERENTIATION
HIV-1 antibody: POSITIVE — AB
HIV-2 Ab: NEGATIVE

## 2023-11-26 LAB — HEPATITIS B CORE ANTIBODY, TOTAL: Hep B Core Total Ab: NONREACTIVE

## 2023-11-26 LAB — HIV ANTIBODY (ROUTINE TESTING W REFLEX): HIV 1&2 Ab, 4th Generation: REACTIVE — AB

## 2023-11-26 LAB — HEPATITIS C RNA QUANTITATIVE
HCV Quantitative Log: <1.18 {Log_IU}/mL
HCV RNA, PCR, QN: <15 [IU]/mL

## 2023-11-26 LAB — HEPATITIS B SURFACE ANTIBODY, QUANTITATIVE: Hep B S AB Quant (Post): <5 m[IU]/mL — ABNORMAL LOW (ref >=10)

## 2023-11-26 LAB — MEASLES/MUMPS/RUBELLA IMMUNITY
Mumps IgG: 175 [AU]/ml
Rubella: 1.77 {index}
Rubeola IgG: >300 [AU]/ml

## 2023-11-26 LAB — RPR: RPR Ser Ql: NONREACTIVE

## 2023-11-26 LAB — VARICELLA ZOSTER ANTIBODY, IGG: Varicella IgG: 8.15 {s_co_ratio}

## 2023-11-26 LAB — HEPATITIS C GENOTYPE: HCV Genotype: NOT DETECTED

## 2023-11-26 LAB — HEPATITIS B SURFACE ANTIGEN: Hepatitis B Surface Ag: NONREACTIVE

## 2023-12-15 ENCOUNTER — Other Ambulatory Visit: Payer: Self-pay | Admitting: Family

## 2023-12-15 ENCOUNTER — Other Ambulatory Visit: Payer: Self-pay

## 2023-12-15 DIAGNOSIS — B20 Human immunodeficiency virus [HIV] disease: Secondary | ICD-10-CM

## 2024-02-09 ENCOUNTER — Other Ambulatory Visit: Payer: Self-pay

## 2024-02-09 ENCOUNTER — Encounter: Payer: Self-pay | Admitting: Family

## 2024-02-09 ENCOUNTER — Ambulatory Visit: Admitting: Family

## 2024-02-09 VITALS — BP 106/72 | HR 63 | Temp 98.4°F

## 2024-02-09 DIAGNOSIS — Z0000 Encounter for general adult medical examination without abnormal findings: Secondary | ICD-10-CM

## 2024-02-09 DIAGNOSIS — F1190 Opioid use, unspecified, uncomplicated: Secondary | ICD-10-CM

## 2024-02-09 DIAGNOSIS — B20 Human immunodeficiency virus [HIV] disease: Secondary | ICD-10-CM | POA: Diagnosis not present

## 2024-02-09 NOTE — Assessment & Plan Note (Addendum)
 Mr. Stjames remains adequately controlled with current dose of Suboxone .  Cravings are stable.  No current drug use.

## 2024-02-09 NOTE — Assessment & Plan Note (Signed)
 Mr. Alperin continues to have a controlled virus with good adherence and tolerance to Biktarvy .  Reviewed previous lab work and discussed plan of care and U equals U.  Remains incarcerated with social determinants of health reviewed.  Check lab work.  Continue current dose of Biktarvy .  Plan for follow-up in 3 to 4 months or sooner if needed with lab work on the same day.

## 2024-02-09 NOTE — Patient Instructions (Signed)
 Nice to see you.  We will check your lab work today.  Continue to take your medication daily as prescribed.  Plan for follow up in 3-4 months or sooner if needed with lab work on the same day.  Have a great day and stay safe!

## 2024-02-09 NOTE — Assessment & Plan Note (Signed)
 Site-specific STD testing offered. Vaccinations reviewed and following counseling declined.  Due for meningococcal, hepatitis B, and tetanus Due for routine dental care which will be scheduled as able

## 2024-02-09 NOTE — Progress Notes (Signed)
 Brief Narrative   Patient ID: Tristan [name], male    DOB: 1985/11/25, 38 y.o.   MRN: [id number]  [name] is a 38 y/o AA male diagnosed with HIV-1 disease in April 2024 with risk factor of heterosexual contact and IV drug use. Initial viral load was 1,390 with CD4 count 365. Genotype with no significant medication resistant mutations. Co-infection with Hepatitis C with no RNA level on 06/10/23. No history of opportunistic infection. ART experienced with Biktarvy .   Subjective:   Chief Complaint  Patient presents with   Follow-up    B20    HPI:  Tristan [name] is a 38 y.o. male with HIV disease last seen on 11/20/2023 with well-controlled virus and good adherence and tolerance to Biktarvy .  Viral load was undetectable with CD4 count 578.Kidney function, liver function, electrolytes within normal ranges.  Lipid profile was normal with triglycerides 101, LDL 81, and HDL 52.  RPR was nonreactive for syphilis.  No infection with hepatitis B and was not immune.  No infection with hepatitis C with hepatitis C RNA level being undetectable.  Here today for routine follow-up.  [name] remains incarcerated and is present with 2 [employer] deputies.  Continues to take Biktarvy  as prescribed with no adverse side effects.  No new concerns/complaints.  Social determinants of health appears stable while incarcerated.  Healthcare maintenance reviewed.  Site-specific STD testing offered. Due for routine dental care.   Denies fevers, chills, night sweats, headaches, changes in vision, neck pain/stiffness, nausea, diarrhea, vomiting, lesions or rashes.  Lab Results  Component Value Date   CD4TCELL 30 (L) 11/20/2023   CD4TABS 578 11/20/2023   Lab Results  Component Value Date   HIV1RNAQUANT 34 (H) 11/20/2023     No Known Allergies    Outpatient Medications Prior to Visit  Medication Sig Dispense Refill   BIKTARVY  50-200-25 MG TABS tablet TAKE 1 TABLET BY MOUTH DAILY  30 tablet 5   buprenorphine -naloxone  (SUBOXONE ) 8-2 mg SUBL SL tablet Place 3 tablets under the tongue daily.     folic acid  (FOLVITE ) 1 MG tablet Take 1 tablet (1 mg total) by mouth daily.     No facility-administered medications prior to visit.     Past Medical History:  Diagnosis Date   Bipolar 1 disorder (HCC)    Hepatitis C    HIV (human immunodeficiency virus infection) (HCC)    Hypertension    PTSD (post-traumatic stress disorder)      Past Surgical History:  Procedure Laterality Date   I & D EXTREMITY Left 05/11/2022   Procedure: IRRIGATION AND DEBRIDEMENT FOREARM;  Surgeon: Marilyn Shropshire, MD;  Location: [facility] OR;  Service: Orthopedics;  Laterality: Left;   NO PAST SURGERIES       Review of Systems  Constitutional:  Negative for appetite change, chills, fatigue, fever and unexpected weight change.  Eyes:  Negative for visual disturbance.  Respiratory:  Negative for cough, chest tightness, shortness of breath and wheezing.   Cardiovascular:  Negative for chest pain and leg swelling.  Gastrointestinal:  Negative for abdominal pain, constipation, diarrhea, nausea and vomiting.  Genitourinary:  Negative for dysuria, flank pain, frequency, genital sores, hematuria and urgency.  Skin:  Negative for rash.  Allergic/Immunologic: Negative for immunocompromised state.  Neurological:  Negative for dizziness and headaches.     Objective:   BP 106/72   Pulse 63   Temp 98.4 F (36.9 C) (Oral)  Nursing note and vital signs reviewed.  Physical Exam Constitutional:  General: He is not in acute distress.    Appearance: He is well-developed.  Eyes:     Conjunctiva/sclera: Conjunctivae normal.  Cardiovascular:     Rate and Rhythm: Normal rate and regular rhythm.     Heart sounds: Normal heart sounds. No murmur heard.    No friction rub. No gallop.  Pulmonary:     Effort: Pulmonary effort is normal. No respiratory distress.     Breath sounds: Normal breath sounds.  No wheezing or rales.  Chest:     Chest wall: No tenderness.  Abdominal:     General: Bowel sounds are normal.     Palpations: Abdomen is soft.     Tenderness: There is no abdominal tenderness.  Musculoskeletal:     Cervical back: Neck supple.  Lymphadenopathy:     Cervical: No cervical adenopathy.  Skin:    General: Skin is warm and dry.     Findings: No rash.  Neurological:     Mental Status: He is alert and oriented to person, place, and time.  Psychiatric:        Behavior: Behavior normal.        Thought Content: Thought content normal.        Judgment: Judgment normal.          02/11/2022    5:08 AM 01/09/2022    8:53 AM 01/08/2022   11:57 AM 11/21/2021    4:22 PM  Depression screen PHQ 2/9  Decreased Interest      Down, Depressed, Hopeless      PHQ - 2 Score      Altered sleeping      Tired, decreased energy      Change in appetite      Feeling bad or failure about yourself       Trouble concentrating      Moving slowly or fidgety/restless      Suicidal thoughts      PHQ-9 Score      Difficult doing work/chores         Information is confidential and restricted. Go to Review Flowsheets to unlock data.         No data to display           The ASCVD Risk score (Arnett DK, et al., 2019) failed to calculate for the following reasons:   The 2019 ASCVD risk score is only valid for ages 27 to 23      Assessment & Plan:    Patient Active Problem List   Diagnosis Date Noted   Opioid use disorder 11/20/2023   Healthcare maintenance 11/20/2023   Sepsis due to cellulitis (HCC) 09/02/2023   HIV disease (HCC) 06/11/2023   HIV test positive (HCC) 06/10/2023   IV drug abuse (HCC) 06/10/2023   Overdose 06/09/2023   Hypokalemia 06/09/2023   Chronic hepatitis C without hepatic coma (HCC) 06/09/2023   Acute alcohol intoxication delirium with moderate or severe use disorder (HCC) 06/09/2023   Toxic encephalopathy 06/08/2023   Surgical wound dehiscence, initial  encounter 05/22/2022   Cellulitis 05/10/2022   Cellulitis of left forearm 05/10/2022   Acute encephalopathy 02/27/2022   Microcytic anemia 02/27/2022   Hypertension    Acute hepatitis C virus infection without hepatic coma 01/18/2022   Psychosis (HCC) 01/16/2022   Transaminitis 01/16/2022   Heroin use disorder, severe, dependence (HCC) 01/07/2022   Altered mental status 10/27/2021   Substance use disorder    Lactic acidosis    Acute respiratory failure with hypoxia  and hypercapnia (HCC)    AKI (acute kidney injury) (HCC)    Polysubstance abuse (HCC) 01/29/2019   MDD (major depressive disorder), severe (HCC) 01/27/2019   Suicide attempt (HCC)    Bipolar 1 disorder, mixed (HCC) 05/02/2014     Problem List Items Addressed This Visit       Other   HIV disease (HCC) - Primary   Mr. Peragine continues to have a controlled virus with good adherence and tolerance to Biktarvy .  Reviewed previous lab work and discussed plan of care and U equals U.  Remains incarcerated with social determinants of health reviewed.  Check lab work.  Continue current dose of Biktarvy .  Plan for follow-up in 3 to 4 months or sooner if needed with lab work on the same day.      Relevant Orders   COMPLETE METABOLIC PANEL WITHOUT GFR   HIV-1 RNA quant-no reflex-bld   T-helper cells (CD4) count (not at [facility])   Opioid use disorder   Mr. Jaffer remains adequately controlled with current dose of Suboxone .  Cravings are stable.  No current drug use.       Healthcare maintenance   Site-specific STD testing offered. Vaccinations reviewed and following counseling declined.  Due for meningococcal, hepatitis B, and tetanus Due for routine dental care which will be scheduled as able        I am having Ezequiel R. Hendrickson maintain his folic acid , buprenorphine -naloxone , and Biktarvy .    Follow-up: Return in about 3 months (around 05/11/2024). or sooner if needed.    Marlan Silva, MSN, FNP-C Nurse  Practitioner [facility] Main number: [phone]

## 2024-02-11 ENCOUNTER — Ambulatory Visit: Payer: Self-pay | Admitting: Family

## 2024-02-11 LAB — COMPLETE METABOLIC PANEL WITHOUT GFR
AG Ratio: 1.6 (calc) (ref 1.0–2.5)
ALT: 9 U/L (ref 9–46)
AST: 15 U/L (ref 10–40)
Albumin: 4.4 g/dL (ref 3.6–5.1)
Alkaline phosphatase (APISO): 69 U/L (ref 36–130)
BUN: 12 mg/dL (ref 7–25)
CO2: 25 mmol/L (ref 20–32)
Calcium: 9.8 mg/dL (ref 8.6–10.3)
Chloride: 104 mmol/L (ref 98–110)
Creat: 0.98 mg/dL (ref 0.60–1.26)
Globulin: 2.8 g/dL (ref 1.9–3.7)
Glucose, Bld: 97 mg/dL (ref 65–99)
Potassium: 4.2 mmol/L (ref 3.5–5.3)
Sodium: 139 mmol/L (ref 135–146)
Total Bilirubin: 0.4 mg/dL (ref 0.2–1.2)
Total Protein: 7.2 g/dL (ref 6.1–8.1)

## 2024-02-11 LAB — T-HELPER CELLS (CD4) COUNT (NOT AT ARMC)
Absolute CD4: 611 {cells}/uL (ref 490–1740)
CD4 T Helper %: 31 % (ref 30–61)
Total lymphocyte count: 1987 {cells}/uL (ref 850–3900)

## 2024-02-11 LAB — HIV-1 RNA QUANT-NO REFLEX-BLD
HIV 1 RNA Quant: <20 {copies}/mL — AB
HIV-1 RNA Quant, Log: <1.3 {Log_copies}/mL — AB

## 2024-02-11 NOTE — Telephone Encounter (Signed)
-----   Message from Annamaria Barrette sent at 02/11/2024 10:39 AM EDT ----- See other note.

## 2024-02-11 NOTE — Telephone Encounter (Signed)
Attempted to contact patient voicemail full

## 2024-05-06 ENCOUNTER — Other Ambulatory Visit: Payer: Self-pay

## 2024-05-06 ENCOUNTER — Encounter: Payer: Self-pay | Admitting: Family

## 2024-05-06 ENCOUNTER — Ambulatory Visit: Admitting: Family

## 2024-05-06 VITALS — BP 114/77 | HR 77 | Temp 97.5°F

## 2024-05-06 DIAGNOSIS — B20 Human immunodeficiency virus [HIV] disease: Secondary | ICD-10-CM | POA: Diagnosis not present

## 2024-05-06 DIAGNOSIS — Z0000 Encounter for general adult medical examination without abnormal findings: Secondary | ICD-10-CM

## 2024-05-06 NOTE — Patient Instructions (Signed)
 Nice to see you.  We will check your lab work today.  Continue to take your medication daily as prescribed.  Plan for follow up in 4 months or sooner if needed with lab work on the same day.  Have a great day and stay safe!

## 2024-05-06 NOTE — Progress Notes (Signed)
 Brief Narrative   Patient ID: Tristan [name], male    DOB: 22-Dec-1985, 38 y.o.   MRN: [id number]  [name] is a 38 y/o AA male diagnosed with HIV-1 disease in April 2024 with risk factor of heterosexual contact and IV drug use. Initial viral load was 1,390 with CD4 count 365. Genotype with no significant medication resistant mutations. Co-infection with Hepatitis C with no RNA level on 06/10/23. No history of opportunistic infection. ART experienced with Biktarvy .   Subjective:   Chief Complaint  Patient presents with   Follow-up    B20    HPI:  Tristan [name] is a 38 y.o. male with HIV disease last seen on 02/09/2024 with well-controlled virus and good adherence and tolerance to Biktarvy .  Viral load was undetectable with CD4 count 611.  Kidney function, liver function, electrolytes within normal ranges.  Here today for follow-up.  [name] remains incarcerated and is present today with [facility] [employer].  He is doing well since his last office visit and continues to take Biktarvy  as prescribed with no adverse side effects or problems receiving medication.  No new concerns/complaints.  Unsure as to when he will be released.  Due for routine dental care.  Healthcare maintenance reviewed.  Denies fevers, chills, night sweats, headaches, changes in vision, neck pain/stiffness, nausea, diarrhea, vomiting, lesions or rashes.  Lab Results  Component Value Date   CD4TCELL 31 02/09/2024   CD4TABS 578 11/20/2023   Lab Results  Component Value Date   HIV1RNAQUANT <20 DETECTED (A) 02/09/2024     No Known Allergies    Outpatient Medications Prior to Visit  Medication Sig Dispense Refill   BIKTARVY  50-200-25 MG TABS tablet TAKE 1 TABLET BY MOUTH DAILY 30 tablet 5   buprenorphine -naloxone  (SUBOXONE ) 8-2 mg SUBL SL tablet Place 3 tablets under the tongue daily.     OLANZapine  (ZYPREXA ) 20 MG tablet Take 20 mg by mouth at bedtime.     folic acid   (FOLVITE ) 1 MG tablet Take 1 tablet (1 mg total) by mouth daily. (Patient not taking: Reported on 05/06/2024)     No facility-administered medications prior to visit.     Past Medical History:  Diagnosis Date   Bipolar 1 disorder (HCC)    Hepatitis C    HIV (human immunodeficiency virus infection) (HCC)    Hypertension    PTSD (post-traumatic stress disorder)      Past Surgical History:  Procedure Laterality Date   I & D EXTREMITY Left 05/11/2022   Procedure: IRRIGATION AND DEBRIDEMENT FOREARM;  Surgeon: Romona Harari, MD;  Location: [facility] OR;  Service: Orthopedics;  Laterality: Left;   NO PAST SURGERIES          Review of Systems  Constitutional:  Negative for appetite change, chills, fatigue, fever and unexpected weight change.  Eyes:  Negative for visual disturbance.  Respiratory:  Negative for cough, chest tightness, shortness of breath and wheezing.   Cardiovascular:  Negative for chest pain and leg swelling.  Gastrointestinal:  Negative for abdominal pain, constipation, diarrhea, nausea and vomiting.  Genitourinary:  Negative for dysuria, flank pain, frequency, genital sores, hematuria and urgency.  Skin:  Negative for rash.  Allergic/Immunologic: Negative for immunocompromised state.  Neurological:  Negative for dizziness and headaches.     Objective:   BP 114/77   Pulse 77   Temp (!) 97.5 F (36.4 C) (Temporal)   SpO2 96%  Nursing note and vital signs reviewed.  Physical Exam Constitutional:  General: He is not in acute distress.    Appearance: He is well-developed.  Eyes:     Conjunctiva/sclera: Conjunctivae normal.  Cardiovascular:     Rate and Rhythm: Normal rate and regular rhythm.     Heart sounds: Normal heart sounds. No murmur heard.    No friction rub. No gallop.  Pulmonary:     Effort: Pulmonary effort is normal. No respiratory distress.     Breath sounds: Normal breath sounds. No wheezing or rales.  Chest:     Chest wall: No  tenderness.  Abdominal:     General: Bowel sounds are normal.     Palpations: Abdomen is soft.     Tenderness: There is no abdominal tenderness.  Musculoskeletal:     Cervical back: Neck supple.  Lymphadenopathy:     Cervical: No cervical adenopathy.  Skin:    General: Skin is warm and dry.     Findings: No rash.  Neurological:     Mental Status: He is alert and oriented to person, place, and time.  Psychiatric:        Mood and Affect: Mood normal.          02/11/2022    5:08 AM 01/09/2022    8:53 AM 01/08/2022   11:57 AM 11/21/2021    4:22 PM  Depression screen PHQ 2/9  Decreased Interest      Down, Depressed, Hopeless      PHQ - 2 Score      Altered sleeping      Tired, decreased energy      Change in appetite      Feeling bad or failure about yourself       Trouble concentrating      Moving slowly or fidgety/restless      Suicidal thoughts      PHQ-9 Score      Difficult doing work/chores         Information is confidential and restricted. Go to Review Flowsheets to unlock data.         No data to display           The ASCVD Risk score (Arnett DK, et al., 2019) failed to calculate for the following reasons:   The 2019 ASCVD risk score is only valid for ages 19 to 20      Assessment & Plan:    Patient Active Problem List   Diagnosis Date Noted   Opioid use disorder 11/20/2023   Healthcare maintenance 11/20/2023   Sepsis due to cellulitis (HCC) 09/02/2023   HIV disease (HCC) 06/11/2023   HIV test positive (HCC) 06/10/2023   IV drug abuse (HCC) 06/10/2023   Overdose 06/09/2023   Hypokalemia 06/09/2023   Chronic hepatitis C without hepatic coma (HCC) 06/09/2023   Acute alcohol intoxication delirium with moderate or severe use disorder (HCC) 06/09/2023   Toxic encephalopathy 06/08/2023   Surgical wound dehiscence, initial encounter 05/22/2022   Cellulitis 05/10/2022   Cellulitis of left forearm 05/10/2022   Acute encephalopathy 02/27/2022    Microcytic anemia 02/27/2022   Hypertension    Acute hepatitis C virus infection without hepatic coma 01/18/2022   Psychosis (HCC) 01/16/2022   Transaminitis 01/16/2022   Heroin use disorder, severe, dependence (HCC) 01/07/2022   Altered mental status 10/27/2021   Substance use disorder    Lactic acidosis    Acute respiratory failure with hypoxia and hypercapnia (HCC)    AKI (acute kidney injury) (HCC)    Polysubstance abuse (HCC) 01/29/2019  MDD (major depressive disorder), severe (HCC) 01/27/2019   Suicide attempt (HCC)    Bipolar 1 disorder, mixed (HCC) 05/02/2014     Problem List Items Addressed This Visit       Other   HIV disease (HCC) - Primary   Tristan [name] continues to have well-controlled virus with good adherence and tolerance to Biktarvy .  Reviewed previous lab work and discussed plan of care, U equals U, and family-planning.  Remains incarcerated with no problems obtaining medication.  Discussed depending upon release to follow-up with financial counselor to determine need for assistance.  Continue current dose of Biktarvy .  Check blood work.  Plan for follow-up in 4 months or sooner if needed with lab work on the same day.      Relevant Orders   T-helper cell (CD4)- ([facility] clinic only)   HIV-1 RNA quant-no reflex-bld     I am having Tristan [name] maintain his folic acid , buprenorphine -naloxone , Biktarvy , and OLANZapine .   Follow-up: Return in about 4 months (around 09/05/2024). or sooner if needed.    Cathlyn July, MSN, FNP-C Nurse Practitioner [facility] for Infectious Disease [facility] Main number: [phone]

## 2024-05-06 NOTE — Assessment & Plan Note (Signed)
 Vaccinations reviewed and following counseling declined.  Recommend influenza vaccine when available. Due for routine dental care Not currently sexually active

## 2024-05-06 NOTE — Assessment & Plan Note (Signed)
 [place] continues to have well-controlled virus with good adherence and tolerance to Biktarvy .  Reviewed previous lab work and discussed plan of care, U equals U, and family-planning.  Remains incarcerated with no problems obtaining medication.  Discussed depending upon release to follow-up with financial counselor to determine need for assistance.  Continue current dose of Biktarvy .  Check blood work.  Plan for follow-up in 4 months or sooner if needed with lab work on the same day.

## 2024-05-07 LAB — T-HELPER CELL (CD4) - (RCID CLINIC ONLY)
CD4 % Helper T Cell: 31 % — ABNORMAL LOW (ref 33–65)
CD4 T Cell Abs: 697 /uL (ref 400–1790)

## 2024-05-08 LAB — HIV-1 RNA QUANT-NO REFLEX-BLD
HIV 1 RNA Quant: NOT DETECTED {copies}/mL
HIV-1 RNA Quant, Log: NOT DETECTED {Log_copies}/mL

## 2024-05-11 ENCOUNTER — Ambulatory Visit: Payer: Self-pay | Admitting: Family

## 2024-05-11 NOTE — Telephone Encounter (Signed)
 Per NP labs look good and viral load undetectable and CD4 count 697. Unable to reach patient by phone during this time. Voicemail box is full and not able to leave secure message to contact the office for results.  Triage to attempt to reach in inform patient.   Enis Kleine, LPN

## 2024-05-11 NOTE — Telephone Encounter (Signed)
-----   Message from Cordella July sent at 05/11/2024  9:48 AM EDT ----- Please inform [name] that his lab work looks good with viral load undetectable and CD4 count 697. Thanks! ----- Message ----- From: Rebecka, Lab In [place] Sent: 05/07/2024   1:35 PM EDT To: Cordella JONETTA July, FNP

## 2024-05-12 NOTE — Progress Notes (Signed)
 Patient currently in custody at [facility].   Tristan [name], BSN, RN

## 2024-06-08 ENCOUNTER — Other Ambulatory Visit: Payer: Self-pay

## 2024-06-08 DIAGNOSIS — B20 Human immunodeficiency virus [HIV] disease: Secondary | ICD-10-CM

## 2024-06-08 MED ORDER — BIKTARVY 50-200-25 MG PO TABS: 1.0000 | ORAL_TABLET | Freq: Every day | ORAL | 5 refills | Status: AC

## 2024-06-08 NOTE — Progress Notes (Signed)
 Patient currently in custody at [facility]. Medication refill requested by jail to send to [facility] in [place]. Refill sent. Patient will possibly be  in in jail longer than follow up appointment needed. Call connected with front desk staff for coordination of follow up visit.   Tristan Kleine, LPN

## 2024-06-13 ENCOUNTER — Other Ambulatory Visit: Payer: Self-pay | Admitting: Family

## 2024-06-13 DIAGNOSIS — B20 Human immunodeficiency virus [HIV] disease: Secondary | ICD-10-CM

## 2024-07-27 ENCOUNTER — Ambulatory Visit: Admitting: Family
